# Patient Record
Sex: Male | Born: 1955 | Race: White | Hispanic: No | Marital: Married | Smoking: Current every day smoker
Health system: Southern US, Community
[De-identification: ages and names within clinical notes are randomized; demographics above are authoritative.]

## PROBLEM LIST (undated history)

## (undated) DIAGNOSIS — Z993 Dependence on wheelchair: Secondary | ICD-10-CM

## (undated) DIAGNOSIS — G629 Polyneuropathy, unspecified: Secondary | ICD-10-CM

## (undated) DIAGNOSIS — I219 Acute myocardial infarction, unspecified: Secondary | ICD-10-CM

## (undated) DIAGNOSIS — E119 Type 2 diabetes mellitus without complications: Secondary | ICD-10-CM

## (undated) DIAGNOSIS — F32A Depression, unspecified: Secondary | ICD-10-CM

## (undated) DIAGNOSIS — K5900 Constipation, unspecified: Secondary | ICD-10-CM

## (undated) DIAGNOSIS — J449 Chronic obstructive pulmonary disease, unspecified: Secondary | ICD-10-CM

## (undated) DIAGNOSIS — N400 Enlarged prostate without lower urinary tract symptoms: Secondary | ICD-10-CM

## (undated) DIAGNOSIS — I639 Cerebral infarction, unspecified: Secondary | ICD-10-CM

## (undated) DIAGNOSIS — I10 Essential (primary) hypertension: Secondary | ICD-10-CM

## (undated) DIAGNOSIS — F329 Major depressive disorder, single episode, unspecified: Secondary | ICD-10-CM

## (undated) DIAGNOSIS — J45909 Unspecified asthma, uncomplicated: Secondary | ICD-10-CM

## (undated) DIAGNOSIS — E785 Hyperlipidemia, unspecified: Secondary | ICD-10-CM

## (undated) DIAGNOSIS — I2510 Atherosclerotic heart disease of native coronary artery without angina pectoris: Secondary | ICD-10-CM

## (undated) DIAGNOSIS — F1010 Alcohol abuse, uncomplicated: Secondary | ICD-10-CM

## (undated) DIAGNOSIS — D510 Vitamin B12 deficiency anemia due to intrinsic factor deficiency: Secondary | ICD-10-CM

## (undated) DIAGNOSIS — Z9989 Dependence on other enabling machines and devices: Secondary | ICD-10-CM

## (undated) DIAGNOSIS — R296 Repeated falls: Secondary | ICD-10-CM

## (undated) DIAGNOSIS — F419 Anxiety disorder, unspecified: Secondary | ICD-10-CM

## (undated) DIAGNOSIS — G8929 Other chronic pain: Secondary | ICD-10-CM

## (undated) HISTORY — PX: CAROTID ENDARTERECTOMY: SUR193

## (undated) HISTORY — PX: CARDIAC CATHETERIZATION: SHX172

---

## 2011-07-11 DIAGNOSIS — I10 Essential (primary) hypertension: Secondary | ICD-10-CM | POA: Diagnosis not present

## 2011-07-11 DIAGNOSIS — G8929 Other chronic pain: Secondary | ICD-10-CM | POA: Diagnosis not present

## 2011-07-11 DIAGNOSIS — E559 Vitamin D deficiency, unspecified: Secondary | ICD-10-CM | POA: Diagnosis not present

## 2011-07-11 DIAGNOSIS — M129 Arthropathy, unspecified: Secondary | ICD-10-CM | POA: Diagnosis not present

## 2011-07-11 DIAGNOSIS — Z125 Encounter for screening for malignant neoplasm of prostate: Secondary | ICD-10-CM | POA: Diagnosis not present

## 2011-08-11 DIAGNOSIS — M25569 Pain in unspecified knee: Secondary | ICD-10-CM | POA: Diagnosis not present

## 2011-09-12 DIAGNOSIS — I119 Hypertensive heart disease without heart failure: Secondary | ICD-10-CM | POA: Diagnosis not present

## 2011-09-12 DIAGNOSIS — I2510 Atherosclerotic heart disease of native coronary artery without angina pectoris: Secondary | ICD-10-CM | POA: Diagnosis not present

## 2011-09-12 DIAGNOSIS — Z1212 Encounter for screening for malignant neoplasm of rectum: Secondary | ICD-10-CM | POA: Diagnosis not present

## 2011-09-21 DIAGNOSIS — Z1211 Encounter for screening for malignant neoplasm of colon: Secondary | ICD-10-CM | POA: Diagnosis not present

## 2011-11-09 DIAGNOSIS — S6990XA Unspecified injury of unspecified wrist, hand and finger(s), initial encounter: Secondary | ICD-10-CM | POA: Diagnosis not present

## 2011-11-09 DIAGNOSIS — Z79899 Other long term (current) drug therapy: Secondary | ICD-10-CM | POA: Diagnosis not present

## 2011-11-09 DIAGNOSIS — S66909A Unspecified injury of unspecified muscle, fascia and tendon at wrist and hand level, unspecified hand, initial encounter: Secondary | ICD-10-CM | POA: Diagnosis not present

## 2011-11-09 DIAGNOSIS — Z7982 Long term (current) use of aspirin: Secondary | ICD-10-CM | POA: Diagnosis not present

## 2011-11-09 DIAGNOSIS — S61409A Unspecified open wound of unspecified hand, initial encounter: Secondary | ICD-10-CM | POA: Diagnosis not present

## 2011-11-10 DIAGNOSIS — C4010 Malignant neoplasm of short bones of unspecified upper limb: Secondary | ICD-10-CM | POA: Diagnosis not present

## 2011-11-10 DIAGNOSIS — I779 Disorder of arteries and arterioles, unspecified: Secondary | ICD-10-CM | POA: Diagnosis not present

## 2011-11-10 DIAGNOSIS — G8929 Other chronic pain: Secondary | ICD-10-CM | POA: Diagnosis not present

## 2011-11-11 DIAGNOSIS — Z01818 Encounter for other preprocedural examination: Secondary | ICD-10-CM | POA: Diagnosis not present

## 2011-11-11 DIAGNOSIS — S61409A Unspecified open wound of unspecified hand, initial encounter: Secondary | ICD-10-CM | POA: Diagnosis not present

## 2011-11-11 DIAGNOSIS — M66239 Spontaneous rupture of extensor tendons, unspecified forearm: Secondary | ICD-10-CM | POA: Diagnosis not present

## 2011-11-11 DIAGNOSIS — I119 Hypertensive heart disease without heart failure: Secondary | ICD-10-CM | POA: Diagnosis not present

## 2011-11-11 DIAGNOSIS — M66249 Spontaneous rupture of extensor tendons, unspecified hand: Secondary | ICD-10-CM | POA: Diagnosis not present

## 2011-11-15 DIAGNOSIS — I252 Old myocardial infarction: Secondary | ICD-10-CM | POA: Diagnosis not present

## 2011-11-15 DIAGNOSIS — G8918 Other acute postprocedural pain: Secondary | ICD-10-CM | POA: Diagnosis not present

## 2011-11-15 DIAGNOSIS — S61209A Unspecified open wound of unspecified finger without damage to nail, initial encounter: Secondary | ICD-10-CM | POA: Diagnosis not present

## 2011-11-15 DIAGNOSIS — S61409A Unspecified open wound of unspecified hand, initial encounter: Secondary | ICD-10-CM | POA: Diagnosis not present

## 2011-11-15 DIAGNOSIS — F17200 Nicotine dependence, unspecified, uncomplicated: Secondary | ICD-10-CM | POA: Diagnosis not present

## 2011-11-15 DIAGNOSIS — E785 Hyperlipidemia, unspecified: Secondary | ICD-10-CM | POA: Diagnosis not present

## 2011-11-15 DIAGNOSIS — J449 Chronic obstructive pulmonary disease, unspecified: Secondary | ICD-10-CM | POA: Diagnosis not present

## 2011-11-15 DIAGNOSIS — Z79899 Other long term (current) drug therapy: Secondary | ICD-10-CM | POA: Diagnosis not present

## 2011-11-15 DIAGNOSIS — M25549 Pain in joints of unspecified hand: Secondary | ICD-10-CM | POA: Diagnosis not present

## 2011-11-15 DIAGNOSIS — F411 Generalized anxiety disorder: Secondary | ICD-10-CM | POA: Diagnosis not present

## 2011-11-15 DIAGNOSIS — I10 Essential (primary) hypertension: Secondary | ICD-10-CM | POA: Diagnosis not present

## 2011-11-24 DIAGNOSIS — M25549 Pain in joints of unspecified hand: Secondary | ICD-10-CM | POA: Diagnosis not present

## 2011-11-25 DIAGNOSIS — M25549 Pain in joints of unspecified hand: Secondary | ICD-10-CM | POA: Diagnosis not present

## 2011-11-28 DIAGNOSIS — M25549 Pain in joints of unspecified hand: Secondary | ICD-10-CM | POA: Diagnosis not present

## 2011-11-29 DIAGNOSIS — M25549 Pain in joints of unspecified hand: Secondary | ICD-10-CM | POA: Diagnosis not present

## 2011-12-02 DIAGNOSIS — M25549 Pain in joints of unspecified hand: Secondary | ICD-10-CM | POA: Diagnosis not present

## 2011-12-09 DIAGNOSIS — M25549 Pain in joints of unspecified hand: Secondary | ICD-10-CM | POA: Diagnosis not present

## 2011-12-12 DIAGNOSIS — M25549 Pain in joints of unspecified hand: Secondary | ICD-10-CM | POA: Diagnosis not present

## 2011-12-16 DIAGNOSIS — M25549 Pain in joints of unspecified hand: Secondary | ICD-10-CM | POA: Diagnosis not present

## 2012-02-10 DIAGNOSIS — M25569 Pain in unspecified knee: Secondary | ICD-10-CM | POA: Diagnosis not present

## 2012-02-10 DIAGNOSIS — Z79899 Other long term (current) drug therapy: Secondary | ICD-10-CM | POA: Diagnosis not present

## 2012-02-10 DIAGNOSIS — I10 Essential (primary) hypertension: Secondary | ICD-10-CM | POA: Diagnosis not present

## 2012-02-10 DIAGNOSIS — J449 Chronic obstructive pulmonary disease, unspecified: Secondary | ICD-10-CM | POA: Diagnosis not present

## 2012-02-10 DIAGNOSIS — N529 Male erectile dysfunction, unspecified: Secondary | ICD-10-CM | POA: Diagnosis not present

## 2012-02-10 DIAGNOSIS — Z5181 Encounter for therapeutic drug level monitoring: Secondary | ICD-10-CM | POA: Diagnosis not present

## 2012-02-10 DIAGNOSIS — G8929 Other chronic pain: Secondary | ICD-10-CM | POA: Diagnosis not present

## 2012-04-17 DIAGNOSIS — I2510 Atherosclerotic heart disease of native coronary artery without angina pectoris: Secondary | ICD-10-CM | POA: Diagnosis not present

## 2012-04-17 DIAGNOSIS — I669 Occlusion and stenosis of unspecified cerebral artery: Secondary | ICD-10-CM | POA: Diagnosis not present

## 2012-04-17 DIAGNOSIS — G894 Chronic pain syndrome: Secondary | ICD-10-CM | POA: Diagnosis not present

## 2012-04-17 DIAGNOSIS — F411 Generalized anxiety disorder: Secondary | ICD-10-CM | POA: Diagnosis not present

## 2012-11-08 DIAGNOSIS — E785 Hyperlipidemia, unspecified: Secondary | ICD-10-CM | POA: Diagnosis not present

## 2012-11-08 DIAGNOSIS — Z79899 Other long term (current) drug therapy: Secondary | ICD-10-CM | POA: Diagnosis not present

## 2012-11-08 DIAGNOSIS — R350 Frequency of micturition: Secondary | ICD-10-CM | POA: Diagnosis not present

## 2012-11-08 DIAGNOSIS — E039 Hypothyroidism, unspecified: Secondary | ICD-10-CM | POA: Diagnosis not present

## 2012-11-08 DIAGNOSIS — R5381 Other malaise: Secondary | ICD-10-CM | POA: Diagnosis not present

## 2012-11-08 DIAGNOSIS — I10 Essential (primary) hypertension: Secondary | ICD-10-CM | POA: Diagnosis not present

## 2012-11-08 DIAGNOSIS — R209 Unspecified disturbances of skin sensation: Secondary | ICD-10-CM | POA: Diagnosis not present

## 2013-01-11 DIAGNOSIS — Z79899 Other long term (current) drug therapy: Secondary | ICD-10-CM | POA: Diagnosis not present

## 2013-01-11 DIAGNOSIS — M79609 Pain in unspecified limb: Secondary | ICD-10-CM | POA: Diagnosis not present

## 2013-01-11 DIAGNOSIS — R209 Unspecified disturbances of skin sensation: Secondary | ICD-10-CM | POA: Diagnosis not present

## 2013-01-11 DIAGNOSIS — I10 Essential (primary) hypertension: Secondary | ICD-10-CM | POA: Diagnosis not present

## 2013-01-11 DIAGNOSIS — E785 Hyperlipidemia, unspecified: Secondary | ICD-10-CM | POA: Diagnosis not present

## 2013-01-11 DIAGNOSIS — M2550 Pain in unspecified joint: Secondary | ICD-10-CM | POA: Diagnosis not present

## 2013-04-12 DIAGNOSIS — R5381 Other malaise: Secondary | ICD-10-CM | POA: Diagnosis not present

## 2013-04-12 DIAGNOSIS — IMO0001 Reserved for inherently not codable concepts without codable children: Secondary | ICD-10-CM | POA: Diagnosis not present

## 2013-04-12 DIAGNOSIS — E785 Hyperlipidemia, unspecified: Secondary | ICD-10-CM | POA: Diagnosis not present

## 2013-04-12 DIAGNOSIS — M2550 Pain in unspecified joint: Secondary | ICD-10-CM | POA: Diagnosis not present

## 2013-04-12 DIAGNOSIS — R209 Unspecified disturbances of skin sensation: Secondary | ICD-10-CM | POA: Diagnosis not present

## 2013-04-12 DIAGNOSIS — I10 Essential (primary) hypertension: Secondary | ICD-10-CM | POA: Diagnosis not present

## 2013-04-12 DIAGNOSIS — Z79899 Other long term (current) drug therapy: Secondary | ICD-10-CM | POA: Diagnosis not present

## 2013-04-12 DIAGNOSIS — R7989 Other specified abnormal findings of blood chemistry: Secondary | ICD-10-CM | POA: Diagnosis not present

## 2013-04-12 DIAGNOSIS — Z23 Encounter for immunization: Secondary | ICD-10-CM | POA: Diagnosis not present

## 2013-07-17 DIAGNOSIS — M2550 Pain in unspecified joint: Secondary | ICD-10-CM | POA: Diagnosis not present

## 2013-07-17 DIAGNOSIS — E785 Hyperlipidemia, unspecified: Secondary | ICD-10-CM | POA: Diagnosis not present

## 2013-07-17 DIAGNOSIS — I10 Essential (primary) hypertension: Secondary | ICD-10-CM | POA: Diagnosis not present

## 2013-07-17 DIAGNOSIS — R51 Headache: Secondary | ICD-10-CM | POA: Diagnosis not present

## 2013-09-11 DIAGNOSIS — B351 Tinea unguium: Secondary | ICD-10-CM | POA: Diagnosis not present

## 2013-09-11 DIAGNOSIS — I739 Peripheral vascular disease, unspecified: Secondary | ICD-10-CM | POA: Diagnosis not present

## 2013-09-11 DIAGNOSIS — E538 Deficiency of other specified B group vitamins: Secondary | ICD-10-CM | POA: Diagnosis not present

## 2013-09-11 DIAGNOSIS — Z6833 Body mass index (BMI) 33.0-33.9, adult: Secondary | ICD-10-CM | POA: Diagnosis not present

## 2014-01-01 DIAGNOSIS — G589 Mononeuropathy, unspecified: Secondary | ICD-10-CM | POA: Diagnosis not present

## 2014-01-01 DIAGNOSIS — I2510 Atherosclerotic heart disease of native coronary artery without angina pectoris: Secondary | ICD-10-CM | POA: Diagnosis not present

## 2014-01-01 DIAGNOSIS — J449 Chronic obstructive pulmonary disease, unspecified: Secondary | ICD-10-CM | POA: Diagnosis not present

## 2014-01-01 DIAGNOSIS — I10 Essential (primary) hypertension: Secondary | ICD-10-CM | POA: Diagnosis not present

## 2014-01-01 DIAGNOSIS — I6350 Cerebral infarction due to unspecified occlusion or stenosis of unspecified cerebral artery: Secondary | ICD-10-CM | POA: Diagnosis not present

## 2014-01-01 DIAGNOSIS — E538 Deficiency of other specified B group vitamins: Secondary | ICD-10-CM | POA: Diagnosis not present

## 2014-01-01 DIAGNOSIS — E039 Hypothyroidism, unspecified: Secondary | ICD-10-CM | POA: Diagnosis not present

## 2014-01-01 DIAGNOSIS — R7301 Impaired fasting glucose: Secondary | ICD-10-CM | POA: Diagnosis not present

## 2014-05-08 DIAGNOSIS — Z6833 Body mass index (BMI) 33.0-33.9, adult: Secondary | ICD-10-CM | POA: Diagnosis not present

## 2014-05-08 DIAGNOSIS — F419 Anxiety disorder, unspecified: Secondary | ICD-10-CM | POA: Diagnosis not present

## 2014-05-08 DIAGNOSIS — M792 Neuralgia and neuritis, unspecified: Secondary | ICD-10-CM | POA: Diagnosis not present

## 2014-06-30 DIAGNOSIS — G629 Polyneuropathy, unspecified: Secondary | ICD-10-CM | POA: Diagnosis not present

## 2014-06-30 DIAGNOSIS — I10 Essential (primary) hypertension: Secondary | ICD-10-CM | POA: Diagnosis not present

## 2014-06-30 DIAGNOSIS — Z6833 Body mass index (BMI) 33.0-33.9, adult: Secondary | ICD-10-CM | POA: Diagnosis not present

## 2014-06-30 DIAGNOSIS — Z125 Encounter for screening for malignant neoplasm of prostate: Secondary | ICD-10-CM | POA: Diagnosis not present

## 2014-06-30 DIAGNOSIS — I2510 Atherosclerotic heart disease of native coronary artery without angina pectoris: Secondary | ICD-10-CM | POA: Diagnosis not present

## 2014-06-30 DIAGNOSIS — E538 Deficiency of other specified B group vitamins: Secondary | ICD-10-CM | POA: Diagnosis not present

## 2014-06-30 DIAGNOSIS — E782 Mixed hyperlipidemia: Secondary | ICD-10-CM | POA: Diagnosis not present

## 2014-06-30 DIAGNOSIS — R252 Cramp and spasm: Secondary | ICD-10-CM | POA: Diagnosis not present

## 2014-09-05 ENCOUNTER — Emergency Department (HOSPITAL_COMMUNITY): Payer: Medicare Other

## 2014-09-05 ENCOUNTER — Encounter (HOSPITAL_COMMUNITY): Payer: Self-pay

## 2014-09-05 ENCOUNTER — Emergency Department (HOSPITAL_COMMUNITY)
Admission: EM | Admit: 2014-09-05 | Discharge: 2014-09-05 | Disposition: A | Discharge status: Home / Self Care | Payer: Medicare Other | Attending: Emergency Medicine | Admitting: Emergency Medicine

## 2014-09-05 DIAGNOSIS — R079 Chest pain, unspecified: Secondary | ICD-10-CM | POA: Insufficient documentation

## 2014-09-05 DIAGNOSIS — S0990XA Unspecified injury of head, initial encounter: Secondary | ICD-10-CM | POA: Diagnosis not present

## 2014-09-05 DIAGNOSIS — S199XXA Unspecified injury of neck, initial encounter: Secondary | ICD-10-CM | POA: Diagnosis not present

## 2014-09-05 DIAGNOSIS — R531 Weakness: Secondary | ICD-10-CM | POA: Diagnosis not present

## 2014-09-05 DIAGNOSIS — N289 Disorder of kidney and ureter, unspecified: Secondary | ICD-10-CM | POA: Insufficient documentation

## 2014-09-05 DIAGNOSIS — R404 Transient alteration of awareness: Secondary | ICD-10-CM | POA: Diagnosis not present

## 2014-09-05 DIAGNOSIS — R4182 Altered mental status, unspecified: Secondary | ICD-10-CM | POA: Insufficient documentation

## 2014-09-05 DIAGNOSIS — J9811 Atelectasis: Secondary | ICD-10-CM | POA: Diagnosis not present

## 2014-09-05 DIAGNOSIS — R0602 Shortness of breath: Secondary | ICD-10-CM | POA: Insufficient documentation

## 2014-09-05 DIAGNOSIS — Z8673 Personal history of transient ischemic attack (TIA), and cerebral infarction without residual deficits: Secondary | ICD-10-CM | POA: Diagnosis not present

## 2014-09-05 DIAGNOSIS — R42 Dizziness and giddiness: Secondary | ICD-10-CM | POA: Insufficient documentation

## 2014-09-05 HISTORY — DX: Atherosclerotic heart disease of native coronary artery without angina pectoris: I25.10

## 2014-09-05 LAB — URINALYSIS, ROUTINE W REFLEX MICROSCOPIC
Bilirubin Urine: NEGATIVE
GLUCOSE, UA: NEGATIVE mg/dL
KETONES UR: NEGATIVE mg/dL
LEUKOCYTES UA: NEGATIVE
Nitrite: NEGATIVE
PROTEIN: 100 mg/dL — AB
Specific Gravity, Urine: 1.02 (ref 1.005–1.030)
UROBILINOGEN UA: 0.2 mg/dL (ref 0.0–1.0)
pH: 6 (ref 5.0–8.0)

## 2014-09-05 LAB — COMPREHENSIVE METABOLIC PANEL
ALBUMIN: 4.1 g/dL (ref 3.5–5.2)
ALK PHOS: 77 U/L (ref 39–117)
ALT: 15 U/L (ref 0–53)
AST: 18 U/L (ref 0–37)
Anion gap: 7 (ref 5–15)
BILIRUBIN TOTAL: 0.6 mg/dL (ref 0.3–1.2)
BUN: 23 mg/dL (ref 6–23)
CHLORIDE: 107 mmol/L (ref 96–112)
CO2: 27 mmol/L (ref 19–32)
CREATININE: 1.49 mg/dL — AB (ref 0.50–1.35)
Calcium: 9.7 mg/dL (ref 8.4–10.5)
GFR calc Af Amer: 58 mL/min — ABNORMAL LOW (ref >90)
GFR calc non Af Amer: 50 mL/min — ABNORMAL LOW (ref >90)
Glucose, Bld: 121 mg/dL — ABNORMAL HIGH (ref 70–99)
POTASSIUM: 4.3 mmol/L (ref 3.5–5.1)
SODIUM: 141 mmol/L (ref 135–145)
Total Protein: 7.8 g/dL (ref 6.0–8.3)

## 2014-09-05 LAB — URINE MICROSCOPIC-ADD ON

## 2014-09-05 LAB — CBC
HCT: 47.9 % (ref 39.0–52.0)
Hemoglobin: 15.9 g/dL (ref 13.0–17.0)
MCH: 32.3 pg (ref 26.0–34.0)
MCHC: 33.2 g/dL (ref 30.0–36.0)
MCV: 97.2 fL (ref 78.0–100.0)
Platelets: 241 10*3/uL (ref 150–400)
RBC: 4.93 MIL/uL (ref 4.22–5.81)
RDW: 15 % (ref 11.5–15.5)
WBC: 9.5 10*3/uL (ref 4.0–10.5)

## 2014-09-05 LAB — RAPID URINE DRUG SCREEN, HOSP PERFORMED
AMPHETAMINES: NOT DETECTED
BENZODIAZEPINES: POSITIVE — AB
Barbiturates: NOT DETECTED
Cocaine: NOT DETECTED
OPIATES: POSITIVE — AB
Tetrahydrocannabinol: NOT DETECTED

## 2014-09-05 LAB — DIFFERENTIAL
BASOS ABS: 0 10*3/uL (ref 0.0–0.1)
Basophils Relative: 0 % (ref 0–1)
Eosinophils Absolute: 0.2 10*3/uL (ref 0.0–0.7)
Eosinophils Relative: 2 % (ref 0–5)
LYMPHS PCT: 15 % (ref 12–46)
Lymphs Abs: 1.5 10*3/uL (ref 0.7–4.0)
Monocytes Absolute: 0.8 10*3/uL (ref 0.1–1.0)
Monocytes Relative: 9 % (ref 3–12)
NEUTROS PCT: 74 % (ref 43–77)
Neutro Abs: 7.1 10*3/uL (ref 1.7–7.7)

## 2014-09-05 LAB — I-STAT TROPONIN, ED: Troponin i, poc: 0 ng/mL (ref 0.00–0.08)

## 2014-09-05 LAB — AMMONIA: AMMONIA: 21 umol/L (ref 11–32)

## 2014-09-05 LAB — SALICYLATE LEVEL

## 2014-09-05 LAB — ETHANOL: Alcohol, Ethyl (B): <5 mg/dL (ref 0–9)

## 2014-09-05 LAB — PROTIME-INR
INR: 0.97 (ref 0.00–1.49)
Prothrombin Time: 13 seconds (ref 11.6–15.2)

## 2014-09-05 LAB — CK: CK TOTAL: 80 U/L (ref 7–232)

## 2014-09-05 LAB — ACETAMINOPHEN LEVEL

## 2014-09-05 LAB — APTT: aPTT: 25 seconds (ref 24–37)

## 2014-09-05 MED ORDER — LORAZEPAM 2 MG/ML IJ SOLN
1.0000 mg | Freq: Once | INTRAMUSCULAR | Status: AC
  Administered 2014-09-05: 1 mg via INTRAVENOUS
  Filled 2014-09-05: qty 1

## 2014-09-05 NOTE — ED Notes (Signed)
MD at bedside. 

## 2014-09-05 NOTE — ED Notes (Signed)
Pt. Trembling at present , difficult to obtain, EKG. Will wait for effects of ativan.

## 2014-09-05 NOTE — ED Notes (Signed)
Per EMS, wife states pt and a friend was sitting on the porch drinking beer at 1600 yesterday. States pt dropped his beer and fell. States pt did not get up until 1100 this morning and he had right sided weakness and would not talk to her.

## 2014-09-05 NOTE — ED Notes (Signed)
Pt. Wants to be discharged.

## 2014-09-05 NOTE — ED Notes (Addendum)
CT called, this pt. Is next for CT.

## 2014-09-05 NOTE — ED Provider Notes (Signed)
CSN: [id number]     Arrival date & time 09/05/14  1610 History  This chart was scribed for Ripley Fraise, MD by Chester Holstein, ED Scribe. This patient was seen in room APA18/APA18 and the patient's care was started at 4:17 PM.    Chief Complaint  Patient presents with  . Altered Mental Status   LEVEL 5 CAVEAT- ALTERED MENTAL STATUS  Patient is a 59 y.o. male presenting with altered mental status. The history is provided by the patient and the EMS personnel. The history is limited by the condition of the patient. No language interpreter was used.  Altered Mental Status  HPI Comments: George Valdez is a 59 y.o. male brought in by ambulance, who presents to the Emergency Department complaining of altered mental status with onset today. Pt with h/o of 2 strokes with deficits. Pt was last seen at baseline yesterday. Family reports pt fell around 4 PM yesterday, states he was holding a beer when he fell from standing to floor. Per EMS associated tremors noted. Pt's family reports he did not get up until 11 AM, noting right sided weakness, and would not talk to wife. Pt notes associated dizziness, chest pain and SOB. Pt denies daily EtOH use. Pt awake in exam room but is confused. Pt denies abdominal pain, headache, back pain, and neck pain.   PMH - stroke Soc hx - ETOH abuse History  Substance Use Topics  . Smoking status: Not on file  . Smokeless tobacco: Not on file  . Alcohol Use: Not on file    Review of Systems  Unable to perform ROS: Mental status change      Allergies  Review of patient's allergies indicates not on file.  Home Medications   Prior to Admission medications   Not on File   BP 116/76 mmHg  Pulse 70  Temp(Src) 99.2 F (37.3 C) (Rectal)  Resp 17  SpO2 93% Physical Exam CONSTITUTIONAL:  disheveled HEAD: Normocephalic/atraumatic EYES: horizontal mystagmus noted ENMT: Mucous membranes moist, No evidence of facial/nasal trauma NECK: supple no  meningeal signs SPINE/BACK:entire spine nontender CV: S1/S2 noted, no murmurs/rubs/gallops noted LUNGS: Lungs are clear to auscultation bilaterally, no apparent distress ABDOMEN: soft, nontender, no rebound or guarding, bowel sounds noted throughout abdomen GU:no cva tenderness NEURO: Pt is awake but appears confused, no facial droop noted, moves all extremities x 4; intermittent upper extremity twitching noted EXTREMITIES: pulses normal/equal, full ROM SKIN: warm, color normal PSYCH: unable to asscess  ED Course  Procedures  DIAGNOSTIC STUDIES: Oxygen Saturation is 93% on room air, adequate by my interpretation.    COORDINATION OF CARE: 4:22 PM Discussed treatment plan with patient at beside, the patient agrees with the plan and has no further questions at this time.  5:41 PM Imaging negative Wife at bedside reports he is more confused/drowsy than normal Unclear time of onset (last known well yesterday) tPA in stroke considered but not given due to: Onset over 3-4.5hours 7:08 PM Pt at baseline per wife He is awake/alert, no distress. He answers questions appropriately No focal weakness noted He has no complaints except for mild dizziness I advised admission as pt with unexplained confusion/weakness earlier in the day I advised I could not r/o stroke with CT head and I advised admission He would still like to go home Wife is agreeable to take him home They will call PCP after the weekend as he may be candidate for home health referral He is awake/alert, no distress and able to make  his own decisions We discussed strict return precautions BP 102/81 mmHg  Pulse 65  Temp(Src) 99.2 F (37.3 C) (Rectal)  Resp 12  SpO2 100%   Labs Review Labs Reviewed  COMPREHENSIVE METABOLIC PANEL - Abnormal; Notable for the following:    Glucose, Bld 121 (*)    Creatinine, Ser 1.49 (*)    GFR calc non Af Amer 50 (*)    GFR calc Af Amer 58 (*)    All other components within normal limits   URINE RAPID DRUG SCREEN (HOSP PERFORMED) - Abnormal; Notable for the following:    Opiates POSITIVE (*)    Benzodiazepines POSITIVE (*)    All other components within normal limits  URINALYSIS, ROUTINE W REFLEX MICROSCOPIC - Abnormal; Notable for the following:    Hgb urine dipstick MODERATE (*)    Protein, ur 100 (*)    All other components within normal limits  ACETAMINOPHEN LEVEL - Abnormal; Notable for the following:    Acetaminophen (Tylenol), Serum <10.0 (*)    All other components within normal limits  ETHANOL  PROTIME-INR  APTT  CBC  DIFFERENTIAL  CK  AMMONIA  SALICYLATE LEVEL  URINE MICROSCOPIC-ADD ON  I-STAT TROPOININ, ED  I-STAT TROPOININ, ED  I-STAT CHEM 8, ED    Imaging Review Ct Head Wo Contrast  09/05/2014   CLINICAL DATA:  Altered mental status, fell yesterday from standing position 1600 hours, slept longer today than normal, confusion, history coronary artery disease  EXAM: CT HEAD WITHOUT CONTRAST  CT CERVICAL SPINE WITHOUT CONTRAST  TECHNIQUE: Multidetector CT imaging of the head and cervical spine was performed following the standard protocol without intravenous contrast. Multiplanar CT image reconstructions of the cervical spine were also generated.  COMPARISON:  None  FINDINGS: CT HEAD FINDINGS  Generalized atrophy.  Normal ventricular morphology.  No midline shift or mass effect.  Old lacunar infarcts RIGHT basal ganglia and LEFT caudate head.  Old medial LEFT cerebellar infarct inferiorly.  No intracranial hemorrhage, mass lesion, or evidence acute infarction.  No extra-axial fluid collections.  Bones and sinuses unremarkable.  Mild atherosclerotic calcification at the carotid siphons.  CT CERVICAL SPINE FINDINGS  Scattered atherosclerotic calcifications with surgical clips adjacent to the LEFT carotid question prior endarterectomy.  Soft tissues otherwise unremarkable.  Visualized skullbase intact.  Prevertebral soft tissues normal thickness.  Disc space  narrowing with endplate spur formation at C5-C6.  Vertebral body heights maintained without fracture or subluxation.  Tips of lung apices clear.  IMPRESSION: Generalized atrophy.  Old LEFT cerebellar, LEFT caudate and RIGHT basal ganglia infarcts.  No acute intracranial abnormalities.  No acute cervical spine abnormalities.   Electronically Signed   By: Lavonia Dana M.D.   On: 09/05/2014 17:21   Ct Cervical Spine Wo Contrast  09/05/2014   CLINICAL DATA:  Altered mental status, fell yesterday from standing position 1600 hours, slept longer today than normal, confusion, history coronary artery disease  EXAM: CT HEAD WITHOUT CONTRAST  CT CERVICAL SPINE WITHOUT CONTRAST  TECHNIQUE: Multidetector CT imaging of the head and cervical spine was performed following the standard protocol without intravenous contrast. Multiplanar CT image reconstructions of the cervical spine were also generated.  COMPARISON:  None  FINDINGS: CT HEAD FINDINGS  Generalized atrophy.  Normal ventricular morphology.  No midline shift or mass effect.  Old lacunar infarcts RIGHT basal ganglia and LEFT caudate head.  Old medial LEFT cerebellar infarct inferiorly.  No intracranial hemorrhage, mass lesion, or evidence acute infarction.  No extra-axial fluid collections.  Bones and sinuses unremarkable.  Mild atherosclerotic calcification at the carotid siphons.  CT CERVICAL SPINE FINDINGS  Scattered atherosclerotic calcifications with surgical clips adjacent to the LEFT carotid question prior endarterectomy.  Soft tissues otherwise unremarkable.  Visualized skullbase intact.  Prevertebral soft tissues normal thickness.  Disc space narrowing with endplate spur formation at C5-C6.  Vertebral body heights maintained without fracture or subluxation.  Tips of lung apices clear.  IMPRESSION: Generalized atrophy.  Old LEFT cerebellar, LEFT caudate and RIGHT basal ganglia infarcts.  No acute intracranial abnormalities.  No acute cervical spine abnormalities.    Electronically Signed   By: Lavonia Dana M.D.   On: 09/05/2014 17:21   Dg Chest Portable 1 View  09/05/2014   CLINICAL DATA:  Altered mental status.  EXAM: PORTABLE CHEST - 1 VIEW  COMPARISON:  None.  FINDINGS: Cardiac enlargement without heart failure. Mild bibasilar atelectasis. Negative for pneumonia or effusion.  IMPRESSION: Mild bibasilar atelectasis.   Electronically Signed   By: Franchot Gallo M.D.   On: 09/05/2014 17:01     EKG Interpretation   Date/Time:  Friday September 05 2014 16:40:43 EST Ventricular Rate:  66 PR Interval:  180 QRS Duration: 97 QT Interval:  393 QTC Calculation: 412 R Axis:   -52 Text Interpretation:  Sinus rhythm Left anterior fascicular block  Borderline T abnormalities, anterior leads Abnormal ekg No previous ECGs  available Confirmed by Christy Gentles  MD, Elenore Rota ([id number]) on 09/05/2014 4:49:04  PM     Medications  LORazepam (ATIVAN) injection 1 mg (1 mg Intravenous Given 09/05/14 1636)    MDM   Final diagnoses:  Renal insufficiency  Altered mental status, unspecified altered mental status type    Nursing notes including past medical history and social history reviewed and considered in documentation xrays/imaging reviewed by myself and considered during evaluation Labs/vital reviewed myself and considered during evaluation   I personally performed the services described in this documentation, which was scribed in my presence. The recorded information has been reviewed and is accurate.       Ripley Fraise, MD 09/05/14 1910

## 2014-09-05 NOTE — ED Notes (Signed)
Pt evaluated by EDP.  

## 2014-09-05 NOTE — Discharge Instructions (Signed)

## 2014-09-08 DIAGNOSIS — R52 Pain, unspecified: Secondary | ICD-10-CM | POA: Diagnosis not present

## 2014-09-08 DIAGNOSIS — Z6833 Body mass index (BMI) 33.0-33.9, adult: Secondary | ICD-10-CM | POA: Diagnosis not present

## 2014-09-08 DIAGNOSIS — G629 Polyneuropathy, unspecified: Secondary | ICD-10-CM | POA: Diagnosis not present

## 2014-09-08 DIAGNOSIS — I639 Cerebral infarction, unspecified: Secondary | ICD-10-CM | POA: Diagnosis not present

## 2014-09-10 ENCOUNTER — Emergency Department (HOSPITAL_COMMUNITY): Payer: Medicare Other

## 2014-09-10 ENCOUNTER — Encounter (HOSPITAL_COMMUNITY): Payer: Self-pay | Admitting: Emergency Medicine

## 2014-09-10 ENCOUNTER — Other Ambulatory Visit (HOSPITAL_COMMUNITY): Payer: Self-pay

## 2014-09-10 ENCOUNTER — Inpatient Hospital Stay (HOSPITAL_COMMUNITY)
Admission: EM | Admit: 2014-09-10 | Discharge: 2014-09-12 | DRG: 896 | Disposition: A | Discharge status: Home Health | Payer: Medicare Other | Attending: Family Medicine | Admitting: Family Medicine

## 2014-09-10 DIAGNOSIS — F419 Anxiety disorder, unspecified: Secondary | ICD-10-CM | POA: Diagnosis present

## 2014-09-10 DIAGNOSIS — F329 Major depressive disorder, single episode, unspecified: Secondary | ICD-10-CM | POA: Diagnosis present

## 2014-09-10 DIAGNOSIS — G8929 Other chronic pain: Secondary | ICD-10-CM | POA: Diagnosis present

## 2014-09-10 DIAGNOSIS — G629 Polyneuropathy, unspecified: Secondary | ICD-10-CM | POA: Diagnosis present

## 2014-09-10 DIAGNOSIS — I69354 Hemiplegia and hemiparesis following cerebral infarction affecting left non-dominant side: Secondary | ICD-10-CM

## 2014-09-10 DIAGNOSIS — G9340 Encephalopathy, unspecified: Secondary | ICD-10-CM | POA: Diagnosis present

## 2014-09-10 DIAGNOSIS — N189 Chronic kidney disease, unspecified: Secondary | ICD-10-CM

## 2014-09-10 DIAGNOSIS — I10 Essential (primary) hypertension: Secondary | ICD-10-CM | POA: Diagnosis present

## 2014-09-10 DIAGNOSIS — R296 Repeated falls: Secondary | ICD-10-CM | POA: Diagnosis not present

## 2014-09-10 DIAGNOSIS — F10931 Alcohol use, unspecified with withdrawal delirium: Secondary | ICD-10-CM

## 2014-09-10 DIAGNOSIS — N289 Disorder of kidney and ureter, unspecified: Secondary | ICD-10-CM | POA: Diagnosis not present

## 2014-09-10 DIAGNOSIS — W19XXXA Unspecified fall, initial encounter: Secondary | ICD-10-CM

## 2014-09-10 DIAGNOSIS — R404 Transient alteration of awareness: Secondary | ICD-10-CM | POA: Diagnosis not present

## 2014-09-10 DIAGNOSIS — N400 Enlarged prostate without lower urinary tract symptoms: Secondary | ICD-10-CM | POA: Diagnosis present

## 2014-09-10 DIAGNOSIS — I2510 Atherosclerotic heart disease of native coronary artery without angina pectoris: Secondary | ICD-10-CM | POA: Diagnosis present

## 2014-09-10 DIAGNOSIS — R4182 Altered mental status, unspecified: Secondary | ICD-10-CM | POA: Diagnosis not present

## 2014-09-10 DIAGNOSIS — R001 Bradycardia, unspecified: Secondary | ICD-10-CM

## 2014-09-10 DIAGNOSIS — Z7982 Long term (current) use of aspirin: Secondary | ICD-10-CM

## 2014-09-10 DIAGNOSIS — Z79899 Other long term (current) drug therapy: Secondary | ICD-10-CM

## 2014-09-10 DIAGNOSIS — N179 Acute kidney failure, unspecified: Secondary | ICD-10-CM

## 2014-09-10 DIAGNOSIS — R410 Disorientation, unspecified: Secondary | ICD-10-CM

## 2014-09-10 DIAGNOSIS — Z7902 Long term (current) use of antithrombotics/antiplatelets: Secondary | ICD-10-CM

## 2014-09-10 DIAGNOSIS — D510 Vitamin B12 deficiency anemia due to intrinsic factor deficiency: Secondary | ICD-10-CM | POA: Diagnosis present

## 2014-09-10 DIAGNOSIS — F17210 Nicotine dependence, cigarettes, uncomplicated: Secondary | ICD-10-CM | POA: Diagnosis present

## 2014-09-10 DIAGNOSIS — Y900 Blood alcohol level of less than 20 mg/100 ml: Secondary | ICD-10-CM | POA: Diagnosis present

## 2014-09-10 DIAGNOSIS — I739 Peripheral vascular disease, unspecified: Secondary | ICD-10-CM

## 2014-09-10 DIAGNOSIS — F10231 Alcohol dependence with withdrawal delirium: Secondary | ICD-10-CM

## 2014-09-10 DIAGNOSIS — F10232 Alcohol dependence with withdrawal with perceptual disturbance: Principal | ICD-10-CM | POA: Diagnosis present

## 2014-09-10 DIAGNOSIS — E785 Hyperlipidemia, unspecified: Secondary | ICD-10-CM | POA: Diagnosis present

## 2014-09-10 DIAGNOSIS — R531 Weakness: Secondary | ICD-10-CM | POA: Diagnosis not present

## 2014-09-10 HISTORY — DX: Vitamin B12 deficiency anemia due to intrinsic factor deficiency: D51.0

## 2014-09-10 HISTORY — DX: Benign prostatic hyperplasia without lower urinary tract symptoms: N40.0

## 2014-09-10 HISTORY — DX: Essential (primary) hypertension: I10

## 2014-09-10 HISTORY — DX: Polyneuropathy, unspecified: G62.9

## 2014-09-10 HISTORY — DX: Hyperlipidemia, unspecified: E78.5

## 2014-09-10 HISTORY — DX: Major depressive disorder, single episode, unspecified: F32.9

## 2014-09-10 HISTORY — DX: Depression, unspecified: F32.A

## 2014-09-10 HISTORY — DX: Anxiety disorder, unspecified: F41.9

## 2014-09-10 HISTORY — DX: Other chronic pain: G89.29

## 2014-09-10 HISTORY — DX: Cerebral infarction, unspecified: I63.9

## 2014-09-10 LAB — CREATININE, SERUM
CREATININE: 1.97 mg/dL — AB (ref 0.50–1.35)
GFR calc Af Amer: 41 mL/min — ABNORMAL LOW (ref >90)
GFR calc non Af Amer: 35 mL/min — ABNORMAL LOW (ref >90)

## 2014-09-10 LAB — URINALYSIS, ROUTINE W REFLEX MICROSCOPIC
Bilirubin Urine: NEGATIVE
Glucose, UA: NEGATIVE mg/dL
KETONES UR: NEGATIVE mg/dL
Leukocytes, UA: NEGATIVE
NITRITE: NEGATIVE
PROTEIN: 100 mg/dL — AB
Specific Gravity, Urine: >1.03 — ABNORMAL HIGH (ref 1.005–1.030)
Urobilinogen, UA: 0.2 mg/dL (ref 0.0–1.0)
pH: 6 (ref 5.0–8.0)

## 2014-09-10 LAB — CBC
HCT: 47 % (ref 39.0–52.0)
HEMATOCRIT: 44 % (ref 39.0–52.0)
HEMOGLOBIN: 14.5 g/dL (ref 13.0–17.0)
Hemoglobin: 15.5 g/dL (ref 13.0–17.0)
MCH: 32.2 pg (ref 26.0–34.0)
MCH: 32.6 pg (ref 26.0–34.0)
MCHC: 33 g/dL (ref 30.0–36.0)
MCHC: 33 g/dL (ref 30.0–36.0)
MCV: 97.8 fL (ref 78.0–100.0)
MCV: 98.7 fL (ref 78.0–100.0)
Platelets: 223 10*3/uL (ref 150–400)
Platelets: 222 10*3/uL (ref 150–400)
RBC: 4.5 MIL/uL (ref 4.22–5.81)
RBC: 4.76 MIL/uL (ref 4.22–5.81)
RDW: 15.2 % (ref 11.5–15.5)
RDW: 15.3 % (ref 11.5–15.5)
WBC: 13.3 10*3/uL — ABNORMAL HIGH (ref 4.0–10.5)
WBC: 12.2 10*3/uL — AB (ref 4.0–10.5)

## 2014-09-10 LAB — COMPREHENSIVE METABOLIC PANEL
ALBUMIN: 3.7 g/dL (ref 3.5–5.2)
ALT: 16 U/L (ref 0–53)
AST: 15 U/L (ref 0–37)
Alkaline Phosphatase: 70 U/L (ref 39–117)
Anion gap: 6 (ref 5–15)
BUN: 40 mg/dL — ABNORMAL HIGH (ref 6–23)
CALCIUM: 8.9 mg/dL (ref 8.4–10.5)
CO2: 25 mmol/L (ref 19–32)
CREATININE: 2.29 mg/dL — AB (ref 0.50–1.35)
Chloride: 109 mmol/L (ref 96–112)
GFR calc Af Amer: 34 mL/min — ABNORMAL LOW (ref >90)
GFR, EST NON AFRICAN AMERICAN: 29 mL/min — AB (ref >90)
Glucose, Bld: 120 mg/dL — ABNORMAL HIGH (ref 70–99)
Potassium: 3.6 mmol/L (ref 3.5–5.1)
Sodium: 140 mmol/L (ref 135–145)
TOTAL PROTEIN: 7 g/dL (ref 6.0–8.3)
Total Bilirubin: 0.5 mg/dL (ref 0.3–1.2)

## 2014-09-10 LAB — RAPID URINE DRUG SCREEN, HOSP PERFORMED
Amphetamines: NOT DETECTED
BENZODIAZEPINES: POSITIVE — AB
Barbiturates: NOT DETECTED
Cocaine: NOT DETECTED
Opiates: POSITIVE — AB
Tetrahydrocannabinol: NOT DETECTED

## 2014-09-10 LAB — GLUCOSE, CAPILLARY: Glucose-Capillary: 118 mg/dL — ABNORMAL HIGH (ref 70–99)

## 2014-09-10 LAB — AMMONIA: AMMONIA: 22 umol/L (ref 11–32)

## 2014-09-10 LAB — URINE MICROSCOPIC-ADD ON

## 2014-09-10 LAB — TSH: TSH: 0.094 u[IU]/mL — AB (ref 0.350–4.500)

## 2014-09-10 LAB — ETHANOL

## 2014-09-10 LAB — TROPONIN I

## 2014-09-10 MED ORDER — LORAZEPAM 2 MG/ML IJ SOLN
0.0000 mg | Freq: Four times a day (QID) | INTRAMUSCULAR | Status: DC
  Administered 2014-09-10: 2 mg via INTRAVENOUS
  Filled 2014-09-10 (×2): qty 1

## 2014-09-10 MED ORDER — LORAZEPAM 2 MG/ML IJ SOLN: 0.0000 mg | Freq: Two times a day (BID) | INTRAMUSCULAR | Status: DC

## 2014-09-10 MED ORDER — BACLOFEN 10 MG PO TABS: 20.0000 mg | ORAL_TABLET | Freq: Two times a day (BID) | ORAL | Status: DC | PRN

## 2014-09-10 MED ORDER — PNEUMOCOCCAL VAC POLYVALENT 25 MCG/0.5ML IJ INJ
0.5000 mL | INJECTION | INTRAMUSCULAR | Status: DC
  Filled 2014-09-10: qty 0.5

## 2014-09-10 MED ORDER — LORAZEPAM 2 MG/ML IJ SOLN: 1.0000 mg | Freq: Four times a day (QID) | INTRAMUSCULAR | Status: DC | PRN

## 2014-09-10 MED ORDER — ONDANSETRON HCL 4 MG/2ML IJ SOLN: 4.0000 mg | Freq: Four times a day (QID) | INTRAMUSCULAR | Status: DC | PRN

## 2014-09-10 MED ORDER — GABAPENTIN 400 MG PO CAPS
800.0000 mg | ORAL_CAPSULE | Freq: Three times a day (TID) | ORAL | Status: DC
  Administered 2014-09-10 – 2014-09-12 (×5): 800 mg via ORAL
  Filled 2014-09-10 (×8): qty 2

## 2014-09-10 MED ORDER — TAMSULOSIN HCL 0.4 MG PO CAPS
0.4000 mg | ORAL_CAPSULE | Freq: Every day | ORAL | Status: DC
  Administered 2014-09-10 – 2014-09-12 (×3): 0.4 mg via ORAL
  Filled 2014-09-10 (×3): qty 1

## 2014-09-10 MED ORDER — ACETAMINOPHEN 650 MG RE SUPP: 650.0000 mg | Freq: Four times a day (QID) | RECTAL | Status: DC | PRN

## 2014-09-10 MED ORDER — VITAMIN B-12 1000 MCG PO TABS
1000.0000 ug | ORAL_TABLET | Freq: Every day | ORAL | Status: DC
  Administered 2014-09-10 – 2014-09-12 (×3): 1000 ug via ORAL
  Filled 2014-09-10 (×3): qty 1

## 2014-09-10 MED ORDER — DULOXETINE HCL 60 MG PO CPEP
60.0000 mg | ORAL_CAPSULE | Freq: Every day | ORAL | Status: DC
  Administered 2014-09-10 – 2014-09-12 (×3): 60 mg via ORAL
  Filled 2014-09-10 (×3): qty 1

## 2014-09-10 MED ORDER — ENALAPRIL MALEATE 5 MG PO TABS
10.0000 mg | ORAL_TABLET | Freq: Every day | ORAL | Status: DC
  Administered 2014-09-10 – 2014-09-12 (×3): 10 mg via ORAL
  Filled 2014-09-10 (×3): qty 2

## 2014-09-10 MED ORDER — SODIUM CHLORIDE 0.9 % IJ SOLN
3.0000 mL | Freq: Two times a day (BID) | INTRAMUSCULAR | Status: DC
  Administered 2014-09-10 – 2014-09-11 (×2): 3 mL via INTRAVENOUS

## 2014-09-10 MED ORDER — VITAMIN B-1 100 MG PO TABS
100.0000 mg | ORAL_TABLET | Freq: Every day | ORAL | Status: DC
  Administered 2014-09-10 – 2014-09-12 (×2): 100 mg via ORAL
  Filled 2014-09-10 (×2): qty 1

## 2014-09-10 MED ORDER — SODIUM CHLORIDE 0.9 % IV SOLN
INTRAVENOUS | Status: AC
  Administered 2014-09-10: 14:00:00 via INTRAVENOUS

## 2014-09-10 MED ORDER — HEPARIN SODIUM (PORCINE) 5000 UNIT/ML IJ SOLN
5000.0000 [IU] | Freq: Three times a day (TID) | INTRAMUSCULAR | Status: DC
  Administered 2014-09-10 – 2014-09-12 (×5): 5000 [IU] via SUBCUTANEOUS
  Filled 2014-09-10 (×6): qty 1

## 2014-09-10 MED ORDER — ACETAMINOPHEN 325 MG PO TABS: 650.0000 mg | ORAL_TABLET | Freq: Four times a day (QID) | ORAL | Status: DC | PRN

## 2014-09-10 MED ORDER — METOPROLOL SUCCINATE ER 50 MG PO TB24
50.0000 mg | ORAL_TABLET | Freq: Every day | ORAL | Status: DC
  Administered 2014-09-10 – 2014-09-11 (×2): 50 mg via ORAL
  Filled 2014-09-10 (×2): qty 1

## 2014-09-10 MED ORDER — SENNOSIDES-DOCUSATE SODIUM 8.6-50 MG PO TABS: 1.0000 | ORAL_TABLET | Freq: Every evening | ORAL | Status: DC | PRN

## 2014-09-10 MED ORDER — LORAZEPAM 1 MG PO TABS
1.0000 mg | ORAL_TABLET | Freq: Four times a day (QID) | ORAL | Status: DC | PRN
  Administered 2014-09-12: 1 mg via ORAL
  Filled 2014-09-10: qty 1

## 2014-09-10 MED ORDER — NALOXONE HCL 0.4 MG/ML IJ SOLN
0.2000 mg | Freq: Once | INTRAMUSCULAR | Status: AC
  Administered 2014-09-10: 0.2 mg via INTRAVENOUS
  Filled 2014-09-10: qty 1

## 2014-09-10 MED ORDER — THIAMINE HCL 100 MG/ML IJ SOLN
100.0000 mg | Freq: Every day | INTRAMUSCULAR | Status: DC
  Filled 2014-09-10: qty 2

## 2014-09-10 MED ORDER — ADULT MULTIVITAMIN W/MINERALS CH
1.0000 | ORAL_TABLET | Freq: Every day | ORAL | Status: DC
  Administered 2014-09-10 – 2014-09-12 (×3): 1 via ORAL
  Filled 2014-09-10 (×3): qty 1

## 2014-09-10 MED ORDER — ALBUTEROL SULFATE HFA 108 (90 BASE) MCG/ACT IN AERS: 2.0000 | INHALATION_SPRAY | Freq: Four times a day (QID) | RESPIRATORY_TRACT | Status: DC | PRN

## 2014-09-10 MED ORDER — ISOSORBIDE MONONITRATE 20 MG PO TABS
10.0000 mg | ORAL_TABLET | Freq: Every day | ORAL | Status: DC
  Administered 2014-09-10 – 2014-09-12 (×3): 10 mg via ORAL
  Filled 2014-09-10 (×3): qty 1

## 2014-09-10 MED ORDER — SODIUM CHLORIDE 0.9 % IV BOLUS (SEPSIS)
1000.0000 mL | Freq: Once | INTRAVENOUS | Status: AC
  Administered 2014-09-10: 1000 mL via INTRAVENOUS

## 2014-09-10 MED ORDER — MORPHINE SULFATE 2 MG/ML IJ SOLN
1.0000 mg | INTRAMUSCULAR | Status: DC | PRN
  Administered 2014-09-10: 1 mg via INTRAVENOUS
  Filled 2014-09-10 (×2): qty 1

## 2014-09-10 MED ORDER — ONDANSETRON HCL 4 MG PO TABS: 4.0000 mg | ORAL_TABLET | Freq: Four times a day (QID) | ORAL | Status: DC | PRN

## 2014-09-10 MED ORDER — CLOPIDOGREL BISULFATE 75 MG PO TABS
75.0000 mg | ORAL_TABLET | Freq: Every day | ORAL | Status: DC
  Administered 2014-09-10 – 2014-09-12 (×3): 75 mg via ORAL
  Filled 2014-09-10 (×3): qty 1

## 2014-09-10 MED ORDER — ALBUTEROL SULFATE (2.5 MG/3ML) 0.083% IN NEBU: 2.5000 mg | INHALATION_SOLUTION | Freq: Four times a day (QID) | RESPIRATORY_TRACT | Status: DC | PRN

## 2014-09-10 MED ORDER — SODIUM CHLORIDE 0.9 % IV SOLN
INTRAVENOUS | Status: DC
  Administered 2014-09-10: 17:00:00 via INTRAVENOUS
  Administered 2014-09-11: 100 mL/h via INTRAVENOUS
  Administered 2014-09-12: 09:00:00 via INTRAVENOUS

## 2014-09-10 MED ORDER — ROSUVASTATIN CALCIUM 20 MG PO TABS
20.0000 mg | ORAL_TABLET | Freq: Every day | ORAL | Status: DC
  Administered 2014-09-10 – 2014-09-12 (×3): 20 mg via ORAL
  Filled 2014-09-10 (×3): qty 1

## 2014-09-10 MED ORDER — TIOTROPIUM BROMIDE MONOHYDRATE 18 MCG IN CAPS
18.0000 ug | ORAL_CAPSULE | Freq: Every day | RESPIRATORY_TRACT | Status: DC | PRN
  Filled 2014-09-10: qty 5

## 2014-09-10 MED ORDER — ASPIRIN EC 81 MG PO TBEC
81.0000 mg | DELAYED_RELEASE_TABLET | Freq: Every day | ORAL | Status: DC
  Administered 2014-09-10 – 2014-09-12 (×3): 81 mg via ORAL
  Filled 2014-09-10 (×3): qty 1

## 2014-09-10 MED ORDER — FOLIC ACID 1 MG PO TABS
1.0000 mg | ORAL_TABLET | Freq: Every day | ORAL | Status: DC
  Administered 2014-09-10 – 2014-09-12 (×3): 1 mg via ORAL
  Filled 2014-09-10 (×3): qty 1

## 2014-09-10 NOTE — ED Notes (Signed)
Patient arrives via EMS from altered mental status that started last night. Patient's wife states he has fallen out of bed x 12 and has been talking to relatives that have passed away. Patient arrives alert/oriented, states he just feels weak all over.

## 2014-09-10 NOTE — ED Notes (Addendum)
Patient states year is 2012 and Tawni Pummel is POTUS. Patient with periods of apnea, 10-15 seconds. MD aware of apnea. Patient awakens to voice. Slurred speech noted. Patient denies any changes in medication or history recently. MD office called to update medical history due to patient being unable to [facility] to fax records ASAP.

## 2014-09-10 NOTE — ED Provider Notes (Addendum)
CSN: [id number]     Arrival date & time 09/10/14  3716 History  This chart was scribed for Davonna Belling, MD by Einar Pheasant, Medical Scribe. This patient was seen in room APA02/APA02 and the patient's care was started at 9:17 AM.    Chief Complaint  Patient presents with  . Altered Mental Status   LEVEL 5 CAVEAT-ALTERED MENTAL STATUS The history is provided by medical records and the EMS personnel. The history is limited by the condition of the patient. No language interpreter was used.   HPI Comments: George Valdez is a 59 y.o. male with PMhx of CAD and 2 strokes was brought in by ambulance, and presents to the Emergency Department complaining of Altered mental Status with hallucinations that started last night. Per EMS personnel-Wife states that the pt has fallen out of his bed multiple times, she approximates a number of 12 times. She also states that the pt claims to have conversations with deceased relatives. Pt states that he feels really weak. He denies any medicinal changes.   Past Medical History  Diagnosis Date  . Coronary artery disease   . Hypertension   . Hyperlipemia   . Anxiety   . Depression   . Chronic pain   . Neuropathy   . Pernicious anemia   . Stroke   . BPH (benign prostatic hyperplasia)    Past Surgical History  Procedure Laterality Date  . Carotid endarterectomy    . Cardiac catheterization     No family history on file. History  Substance Use Topics  . Smoking status: Current Every Day Smoker  . Smokeless tobacco: Not on file  . Alcohol Use: Yes    Review of Systems  Unable to perform ROS: Mental status change   Allergies  Codeine  Home Medications   Prior to Admission medications   Medication Sig Start Date End Date Taking? Authorizing Provider  albuterol (PROVENTIL HFA;VENTOLIN HFA) 108 (90 BASE) MCG/ACT inhaler Inhale 2 puffs into the lungs every 6 (six) hours as needed for wheezing or shortness of breath.   Yes Historical  Provider, MD  ALPRAZolam Duanne Moron) 1 MG tablet Take 1 mg by mouth 2 (two) times daily as needed for anxiety.   Yes Historical Provider, MD  aspirin EC 81 MG tablet Take 81 mg by mouth daily.   Yes Historical Provider, MD  baclofen (LIORESAL) 20 MG tablet Take 20 mg by mouth 2 (two) times daily as needed for muscle spasms.   Yes Historical Provider, MD  clopidogrel (PLAVIX) 75 MG tablet Take 75 mg by mouth daily.   Yes Historical Provider, MD  DULoxetine (CYMBALTA) 60 MG capsule Take 60 mg by mouth daily.   Yes Historical Provider, MD  enalapril (VASOTEC) 10 MG tablet Take 10 mg by mouth daily.   Yes Historical Provider, MD  gabapentin (NEURONTIN) 800 MG tablet Take 800 mg by mouth 3 (three) times daily.   Yes Historical Provider, MD  HYDROcodone-acetaminophen (NORCO/VICODIN) 5-325 MG per tablet Take 1 tablet by mouth 3 (three) times daily as needed for moderate pain.   Yes Historical Provider, MD  isosorbide mononitrate (ISMO,MONOKET) 20 MG tablet Take 10 mg by mouth daily.   Yes Historical Provider, MD  metoprolol succinate (TOPROL-XL) 50 MG 24 hr tablet Take 50 mg by mouth 2 (two) times daily. Take with or immediately following a meal.   Yes Historical Provider, MD  rosuvastatin (CRESTOR) 20 MG tablet Take 20 mg by mouth daily.   Yes Historical Provider, MD  tamsulosin (FLOMAX) 0.4 MG CAPS capsule Take 0.4 mg by mouth daily.   Yes Historical Provider, MD  tiotropium (SPIRIVA) 18 MCG inhalation capsule Place 18 mcg into inhaler and inhale daily as needed (for shortness of breath).    Yes Historical Provider, MD  vitamin B-12 (CYANOCOBALAMIN) 1000 MCG tablet Take 1,000 mcg by mouth daily.   Yes Historical Provider, MD   BP 104/67 mmHg  Pulse 48  Temp(Src) 97.3 F (36.3 C) (Rectal)  Resp 14  Ht 5\' 3"  (1.6 m)  Wt 188 lb (85.276 kg)  BMI 33.31 kg/m2  SpO2 100%   Physical Exam  Constitutional: He appears well-developed and well-nourished. No distress.  Pt is somewhat somnolent.   HENT:  Head:  Normocephalic and atraumatic.  Eyes: Conjunctivae are normal. Right eye exhibits no discharge. Left eye exhibits no discharge.  Pupils constricted bilaterally. Mild discharge to left eye area  Neck: Neck supple.  Cardiovascular: Regular rhythm and normal heart sounds.  Bradycardia present.  Exam reveals no gallop and no friction rub.   No murmur heard. Pulmonary/Chest: Effort normal and breath sounds normal. No respiratory distress.  Abdominal: Soft. He exhibits no distension. There is no tenderness.  Musculoskeletal: He exhibits edema. He exhibits no tenderness.  Bilateral pitting edema to lower extremities.  Neurological: He is alert.  Skin: Skin is warm and dry.  Psychiatric: He has a normal mood and affect. His behavior is normal. Thought content normal.  Nursing note and vitals reviewed.   ED Course  Procedures (including critical care time)  COORDINATION OF CARE: 9:23 AM- Pt will continue to be monitored. Essential labs and imaging ordered.   Labs Review Labs Reviewed  URINALYSIS, ROUTINE W REFLEX MICROSCOPIC - Abnormal; Notable for the following:    Specific Gravity, Urine >1.030 (*)    Hgb urine dipstick MODERATE (*)    Protein, ur 100 (*)    All other components within normal limits  URINE RAPID DRUG SCREEN (HOSP PERFORMED) - Abnormal; Notable for the following:    Opiates POSITIVE (*)    Benzodiazepines POSITIVE (*)    All other components within normal limits  CBC - Abnormal; Notable for the following:    WBC 13.3 (*)    All other components within normal limits  COMPREHENSIVE METABOLIC PANEL - Abnormal; Notable for the following:    Glucose, Bld 120 (*)    BUN 40 (*)    Creatinine, Ser 2.29 (*)    GFR calc non Af Amer 29 (*)    GFR calc Af Amer 34 (*)    All other components within normal limits  URINE MICROSCOPIC-ADD ON - Abnormal; Notable for the following:    Bacteria, UA MANY (*)    All other components within normal limits  GLUCOSE, CAPILLARY - Abnormal;  Notable for the following:    Glucose-Capillary 118 (*)    All other components within normal limits  AMMONIA  ETHANOL  TROPONIN I  CBC  CREATININE, SERUM  TSH  VITAMIN B12  RPR  HIV ANTIBODY (ROUTINE TESTING)  CBG MONITORING, ED    Imaging Review Dg Chest 1 View  09/10/2014   CLINICAL DATA:  59 year old male with altered mental status weakness and falls. Initial encounter.  EXAM: CHEST  1 VIEW  COMPARISON:  09/05/2014.  FINDINGS: Portable AP semi upright view at 0943 hours. Lung volumes are within normal limits. Allowing for portable technique, the lungs are clear. No pneumothorax identified. Normal cardiac size and mediastinal contours. Visualized tracheal air column is  within normal limits. Chronic right clavicle fracture. No acute osseous abnormality identified.  IMPRESSION: No acute cardiopulmonary abnormality.   Electronically Signed   By: Genevie Ann M.D.   On: 09/10/2014 09:57   Ct Head Wo Contrast  09/10/2014   CLINICAL DATA:  59 year old male with multiple falls and altered mental status. Initial encounter.  EXAM: CT HEAD WITHOUT CONTRAST  TECHNIQUE: Contiguous axial images were obtained from the base of the skull through the vertex without intravenous contrast.  COMPARISON:  09/05/2014.  FINDINGS: Visualized orbit soft tissues are within normal limits. No scalp hematoma identified. Stable paranasal sinuses and mastoids. Chronic left lamina papyracea fracture. No acute osseous abnormality identified. Calcified atherosclerosis at the skull base.  Midline and left cerebellar hemisphere infarcts appear stable and chronic. Heterogeneity of the left basal ganglia compatible with age indeterminate small vessel ischemia appears stable. Mild involvement of the right basal ganglia. Mild nonspecific white matter hypodensity. No intracranial mass effect or ventriculomegaly. No acute intracranial hemorrhage identified. No evidence of cortically based acute infarction identified. Stable intracranial  vascular hyperdensity.  IMPRESSION: 1. No acute intracranial abnormality. No acute traumatic injury identified. 2. Stable appearance of ischemia in the cerebellum and basal ganglia, favor to be chronic.   Electronically Signed   By: Genevie Ann M.D.   On: 09/10/2014 09:56     EKG Interpretation None      MDM   Final diagnoses:  Altered mental status  Renal insufficiency    Patient with altered mental status. Has been hallucinating. Has had falls. Slight decrease in his sedation with Narcan. Creatinine increased. Likely decreased oral intake. Will admit to internal medicine.   Davonna Belling, MD 09/10/14 [place], MD 09/10/14 [phone]

## 2014-09-10 NOTE — ED Notes (Signed)
Report given to [facility] [id number]. Ready to receive pt to floor.

## 2014-09-10 NOTE — H&P (Signed)
[facility]          History and Physical    PCP:   Rocky Morel, MD   Chief Complaint:  Hallucinations, falls  HPI: Patient is a 59 year old man with history significant for alcohol abuse, hypertension, pernicious anemia, peripheral vascular disease, carotid artery disease status post endarterectomy as well as coronary artery disease who presents to the hospital with the above-mentioned complaints. Patient is currently sleeping and difficult to arouse although he will wake up and answer simple questions. Most of history is obtained from patient's wife at bedside. She states he typically drinks 4-5 days a week and will drink anywhere from 4-8 beers. Patient's last drink was on Saturday. She states that last night at around 10:30 they decided to go to bed and he fell on his way to the bed, did not hit his head did not lose consciousness. She was able to get him in bed but he stayed awake all night and per her recollection he fell no less than 12 times last night each time he was able to pick himself up and get back into bed. At one point he was seeing "a little girl and a little boy in the living room" and he wanted to go and talk to them. Workup in the emergency department is significant for a negative CT scan of the head, no source of infection identified via chest x-ray and UA, normal ammonia level of 22, he does have acute renal failure with a creatinine of 2.29, mild leukocytosis of 13.3, negative Tylenol and aspirin levels. We have been asked to admit him for workup  of his acute encephalopathy.  Allergies:   Allergies  Allergen Reactions  . Codeine Itching      Past Medical History  Diagnosis Date  . Coronary artery disease   . Hypertension   . Hyperlipemia   . Anxiety   . Depression   . Chronic pain   . Neuropathy   . Pernicious anemia   . Stroke   . BPH (benign prostatic hyperplasia)     Past Surgical History  Procedure Laterality Date    . Carotid endarterectomy    . Cardiac catheterization      Prior to Admission medications   Medication Sig Start Date End Date Taking? Authorizing Provider  albuterol (PROVENTIL HFA;VENTOLIN HFA) 108 (90 BASE) MCG/ACT inhaler Inhale 2 puffs into the lungs every 6 (six) hours as needed for wheezing or shortness of breath.   Yes Historical Provider, MD  ALPRAZolam Duanne Moron) 1 MG tablet Take 1 mg by mouth 2 (two) times daily as needed for anxiety.   Yes Historical Provider, MD  aspirin EC 81 MG tablet Take 81 mg by mouth daily.   Yes Historical Provider, MD  baclofen (LIORESAL) 20 MG tablet Take 20 mg by mouth 2 (two) times daily as needed for muscle spasms.   Yes Historical Provider, MD  clopidogrel (PLAVIX) 75 MG tablet Take 75 mg by mouth daily.   Yes Historical Provider, MD  DULoxetine (CYMBALTA) 60 MG capsule Take 60 mg by mouth daily.   Yes Historical Provider, MD  enalapril (VASOTEC) 10 MG tablet Take 10 mg by mouth daily.   Yes Historical Provider, MD  gabapentin (NEURONTIN) 800 MG tablet Take 800 mg by mouth 3 (three) times daily.   Yes Historical Provider, MD  HYDROcodone-acetaminophen (NORCO/VICODIN) 5-325 MG per tablet Take 1 tablet by mouth 3 (three)  times daily as needed for moderate pain.   Yes Historical Provider, MD  isosorbide mononitrate (ISMO,MONOKET) 20 MG tablet Take 10 mg by mouth daily.   Yes Historical Provider, MD  metoprolol succinate (TOPROL-XL) 50 MG 24 hr tablet Take 50 mg by mouth 2 (two) times daily. Take with or immediately following a meal.   Yes Historical Provider, MD  rosuvastatin (CRESTOR) 20 MG tablet Take 20 mg by mouth daily.   Yes Historical Provider, MD  tamsulosin (FLOMAX) 0.4 MG CAPS capsule Take 0.4 mg by mouth daily.   Yes Historical Provider, MD  tiotropium (SPIRIVA) 18 MCG inhalation capsule Place 18 mcg into inhaler and inhale daily as needed (for shortness of breath).    Yes Historical Provider, MD  vitamin B-12 (CYANOCOBALAMIN) 1000 MCG tablet Take  1,000 mcg by mouth daily.   Yes Historical Provider, MD    Social History:  reports that he has been smoking.  He does not have any smokeless tobacco history on file. He reports that he drinks alcohol. His drug history is not on file.  Family history: Unable to obtain given current mental state  Review of Systems:   unable to obtain given current mental state  Physical Exam: Blood pressure 151/74, pulse 49, temperature 97.3 F (36.3 C), temperature source Rectal, resp. rate 16, height '5\' 3"'  (1.6 m), weight 87.544 kg (193 lb), SpO2 100 %.  general: Drowsy, arouses briefly to voice, can answer very simple questions but falls back asleep. HEENT: Normocephalic, atraumatic, pupils equal round reactive to light, very dry mucous membranes, poor dentition. Neck: Supple, no JVD, no lymphadenopathy, no bruits, no goiter. Cardiovascular: Regular rhythm, bradycardic, no murmurs, rubs or gallops  Lungs: Clear to auscultation bilaterally. Abdomen: Soft, nontender, nondistended, positive bowel sounds Extremities: No clubbing, cyanosis or edema, positive pedal pulses. Neurologic: Unable to fully assess given current mental state, however I have seen him moving all 4 extremities spontaneously  Labs on Admission:  Results for orders placed or performed during the hospital encounter of 09/10/14 (from the past 48 hour(s))  Urinalysis, Routine w reflex microscopic     Status: Abnormal   Collection Time: 09/10/14  9:08 AM  Result Value Ref Range   Color, Urine YELLOW YELLOW   APPearance CLEAR CLEAR   Specific Gravity, Urine >1.030 (H) 1.005 - 1.030   pH 6.0 5.0 - 8.0   Glucose, UA NEGATIVE NEGATIVE mg/dL   Hgb urine dipstick MODERATE (A) NEGATIVE   Bilirubin Urine NEGATIVE NEGATIVE   Ketones, ur NEGATIVE NEGATIVE mg/dL   Protein, ur 100 (A) NEGATIVE mg/dL   Urobilinogen, UA 0.2 0.0 - 1.0 mg/dL   Nitrite NEGATIVE NEGATIVE   Leukocytes, UA NEGATIVE NEGATIVE  Drug screen panel, emergency     Status:  Abnormal   Collection Time: 09/10/14  9:08 AM  Result Value Ref Range   Opiates POSITIVE (A) NONE DETECTED   Cocaine NONE DETECTED NONE DETECTED   Benzodiazepines POSITIVE (A) NONE DETECTED   Amphetamines NONE DETECTED NONE DETECTED   Tetrahydrocannabinol NONE DETECTED NONE DETECTED   Barbiturates NONE DETECTED NONE DETECTED    Comment:        DRUG SCREEN FOR MEDICAL PURPOSES ONLY.  IF CONFIRMATION IS NEEDED FOR ANY PURPOSE, NOTIFY LAB WITHIN 5 DAYS.        LOWEST DETECTABLE LIMITS FOR URINE DRUG SCREEN Drug Class       Cutoff (ng/mL) Amphetamine      1000 Barbiturate      200 Benzodiazepine   200  Tricyclics       357 Opiates          300 Cocaine          300 THC              50   Urine microscopic-add on     Status: Abnormal   Collection Time: 09/10/14  9:08 AM  Result Value Ref Range   Squamous Epithelial / LPF RARE RARE   RBC / HPF 7-10 <3 RBC/hpf   Bacteria, UA MANY (A) RARE  Glucose, capillary     Status: Abnormal   Collection Time: 09/10/14  9:20 AM  Result Value Ref Range   Glucose-Capillary 118 (H) 70 - 99 mg/dL  CBC     Status: Abnormal   Collection Time: 09/10/14  9:25 AM  Result Value Ref Range   WBC 13.3 (H) 4.0 - 10.5 K/uL   RBC 4.50 4.22 - 5.81 MIL/uL   Hemoglobin 14.5 13.0 - 17.0 g/dL   HCT 44.0 39.0 - 52.0 %   MCV 97.8 78.0 - 100.0 fL   MCH 32.2 26.0 - 34.0 pg   MCHC 33.0 30.0 - 36.0 g/dL   RDW 15.2 11.5 - 15.5 %   Platelets 223 150 - 400 K/uL  Comprehensive metabolic panel     Status: Abnormal   Collection Time: 09/10/14  9:25 AM  Result Value Ref Range   Sodium 140 135 - 145 mmol/L   Potassium 3.6 3.5 - 5.1 mmol/L   Chloride 109 96 - 112 mmol/L   CO2 25 19 - 32 mmol/L   Glucose, Bld 120 (H) 70 - 99 mg/dL   BUN 40 (H) 6 - 23 mg/dL   Creatinine, Ser 2.29 (H) 0.50 - 1.35 mg/dL   Calcium 8.9 8.4 - 10.5 mg/dL   Total Protein 7.0 6.0 - 8.3 g/dL   Albumin 3.7 3.5 - 5.2 g/dL   AST 15 0 - 37 U/L   ALT 16 0 - 53 U/L   Alkaline Phosphatase 70 39 -  117 U/L   Total Bilirubin 0.5 0.3 - 1.2 mg/dL   GFR calc non Af Amer 29 (L) >90 mL/min   GFR calc Af Amer 34 (L) >90 mL/min    Comment: (NOTE) The eGFR has been calculated using the CKD EPI equation. This calculation has not been validated in all clinical situations. eGFR's persistently <90 mL/min signify possible Chronic Kidney Disease.    Anion gap 6 5 - 15  Ammonia     Status: None   Collection Time: 09/10/14  9:25 AM  Result Value Ref Range   Ammonia 22 11 - 32 umol/L  Ethanol     Status: None   Collection Time: 09/10/14  9:25 AM  Result Value Ref Range   Alcohol, Ethyl (B) <5 0 - 9 mg/dL    Comment:        LOWEST DETECTABLE LIMIT FOR SERUM ALCOHOL IS 11 mg/dL FOR MEDICAL PURPOSES ONLY   Troponin I     Status: None   Collection Time: 09/10/14  9:25 AM  Result Value Ref Range   Troponin I <0.03 <0.031 ng/mL    Comment:        NO INDICATION OF MYOCARDIAL INJURY.     Radiological Exams on Admission: Dg Chest 1 View  09/10/2014   CLINICAL DATA:  59 year old male with altered mental status weakness and falls. Initial encounter.  EXAM: CHEST  1 VIEW  COMPARISON:  09/05/2014.  FINDINGS: Portable AP semi  upright view at 0943 hours. Lung volumes are within normal limits. Allowing for portable technique, the lungs are clear. No pneumothorax identified. Normal cardiac size and mediastinal contours. Visualized tracheal air column is within normal limits. Chronic right clavicle fracture. No acute osseous abnormality identified.  IMPRESSION: No acute cardiopulmonary abnormality.   Electronically Signed   By: Genevie Ann M.D.   On: 09/10/2014 09:57   Ct Head Wo Contrast  09/10/2014   CLINICAL DATA:  59 year old male with multiple falls and altered mental status. Initial encounter.  EXAM: CT HEAD WITHOUT CONTRAST  TECHNIQUE: Contiguous axial images were obtained from the base of the skull through the vertex without intravenous contrast.  COMPARISON:  09/05/2014.  FINDINGS: Visualized orbit  soft tissues are within normal limits. No scalp hematoma identified. Stable paranasal sinuses and mastoids. Chronic left lamina papyracea fracture. No acute osseous abnormality identified. Calcified atherosclerosis at the skull base.  Midline and left cerebellar hemisphere infarcts appear stable and chronic. Heterogeneity of the left basal ganglia compatible with age indeterminate small vessel ischemia appears stable. Mild involvement of the right basal ganglia. Mild nonspecific white matter hypodensity. No intracranial mass effect or ventriculomegaly. No acute intracranial hemorrhage identified. No evidence of cortically based acute infarction identified. Stable intracranial vascular hyperdensity.  IMPRESSION: 1. No acute intracranial abnormality. No acute traumatic injury identified. 2. Stable appearance of ischemia in the cerebellum and basal ganglia, favor to be chronic.   Electronically Signed   By: Genevie Ann M.D.   On: 09/10/2014 09:56    Assessment/Plan Active Problems:   Alcohol withdrawal delirium   Acute encephalopathy   ARF (acute renal failure)   Sinus bradycardia   HTN (hypertension)   CAD (coronary artery disease)   PVD (peripheral vascular disease)   Pernicious anemia    Acute encephalopathy -At this point most likely etiology appears to be alcohol withdrawals. -No current source of infection, do not believe that current degree of acute renal failure is playing a role, ammonia is within normal limits, CT scan of the head is negative and given lack of focal deficits I do not believe an MRI is warranted at this time.  Frequent falls -Suspect related to alcohol withdrawals. -Check TSH, B-12, RPR.  -PT evaluation once more alert.  Alcohol withdrawal -Placed on Ativan withdrawal protocol. -Thiamine/folate.  Acute renal failure -His current creatinine is 2.29, most recent creatinine is 1.49 on March 11. I do not have any more remote creatinines to determine chronicity. -IV  fluids and recheck renal function in the morning.  Sinus bradycardia  -We'll decrease his metoprolol from 50 mg twice a day to once daily and follow.  Pernicious anemia  -B-12 injections once a month administered by wife.  Hypertension -Continue home medications with the exception of decreased dose of metoprolol for above-mentioned reasons.  DVT prophylaxis -Subcutaneous heparin  CODE STATUS -Full code as discussed with wife at bedside.   Time Spent on Admission:  85 minutes  [facility] Pager: [phone] 09/10/2014, 3:14 PM

## 2014-09-11 LAB — HIV ANTIBODY (ROUTINE TESTING W REFLEX): HIV Screen 4th Generation wRfx: NONREACTIVE

## 2014-09-11 LAB — CBC
HCT: 42.6 % (ref 39.0–52.0)
Hemoglobin: 13.8 g/dL (ref 13.0–17.0)
MCH: 31.8 pg (ref 26.0–34.0)
MCHC: 32.4 g/dL (ref 30.0–36.0)
MCV: 98.2 fL (ref 78.0–100.0)
Platelets: 201 10*3/uL (ref 150–400)
RBC: 4.34 MIL/uL (ref 4.22–5.81)
RDW: 15.1 % (ref 11.5–15.5)
WBC: 8.7 10*3/uL (ref 4.0–10.5)

## 2014-09-11 LAB — BASIC METABOLIC PANEL
Anion gap: 4 — ABNORMAL LOW (ref 5–15)
BUN: 25 mg/dL — ABNORMAL HIGH (ref 6–23)
CO2: 22 mmol/L (ref 19–32)
CREATININE: 1.71 mg/dL — AB (ref 0.50–1.35)
Calcium: 8.5 mg/dL (ref 8.4–10.5)
Chloride: 117 mmol/L — ABNORMAL HIGH (ref 96–112)
GFR calc Af Amer: 48 mL/min — ABNORMAL LOW (ref >90)
GFR, EST NON AFRICAN AMERICAN: 42 mL/min — AB (ref >90)
Glucose, Bld: 117 mg/dL — ABNORMAL HIGH (ref 70–99)
Potassium: 3.2 mmol/L — ABNORMAL LOW (ref 3.5–5.1)
Sodium: 143 mmol/L (ref 135–145)

## 2014-09-11 LAB — RPR: RPR Ser Ql: NONREACTIVE

## 2014-09-11 LAB — VITAMIN B12: Vitamin B-12: 1870 pg/mL — ABNORMAL HIGH (ref 211–911)

## 2014-09-11 MED ORDER — POTASSIUM CHLORIDE CRYS ER 20 MEQ PO TBCR
40.0000 meq | EXTENDED_RELEASE_TABLET | Freq: Once | ORAL | Status: AC
  Administered 2014-09-11: 40 meq via ORAL
  Filled 2014-09-11: qty 2

## 2014-09-11 NOTE — Care Management Utilization Note (Signed)
UR completed 

## 2014-09-11 NOTE — Care Management Note (Addendum)
    Page 1 of 2   09/12/2014     1:49:12 PM CARE MANAGEMENT NOTE 09/12/2014  Patient:  Flam,Alder J   Account Number:  [id number]  Date Initiated:  09/11/2014  Documentation initiated by:  Jolene Provost  Subjective/Objective Assessment:   Pt is from home, lives with wife and independent at baseline. Pt has no HH services of DME's. PT to continue working with pt. Will cont to follow for CM needs.     Action/Plan:   Anticipated DC Date:  09/13/2014   Anticipated DC Plan:  [place]  CM consult      PAC Choice  [place]   Choice offered to / List presented to:  C-1 Patient   DME arranged  Vassie Moselle      DME agency  [place] arranged  [place].   Status of service:  Completed, signed off Medicare Important Message given?  YES (If response is "NO", the following Medicare IM given date fields will be blank) Date Medicare IM given:  09/12/2014 Medicare IM given by:  Jolene Provost Date Additional Medicare IM given:   Additional Medicare IM given by:    Discharge Disposition:  [place]  Per UR Regulation:  Reviewed for med. necessity/level of care/duration of stay  If discussed at [place] of Stay Meetings, dates discussed:    Comments:  09/12/2014 [place], RN, MSN, CM Pt being discharged home with [facility] services. Pt has choosen [facility] for [facility] and DME needs. Romualdo Bolk of [facility] has been notified of referral and will obtain pt information from chart. Pt notified that PT has 48 hours to make first visit. Terrence Dupont, of [facility] notified of referral and will obtain pt information from chart. Will deliver walker to pt room prior to discharge. Wife states pt has [employer], television/film/video but cant use it becasue he does not have a ramp to get out of the house and cant afford to have one built. Pt's wife given contact  information for volunteer services with [facility]. No further CM needs.  09/11/2014 [place], RN, MSN, CM

## 2014-09-11 NOTE — Progress Notes (Signed)
[facility] PROGRESS NOTE  George Valdez KVQ:[id number] DOB: May 16, 1956 DOA: 09/10/2014 PCP: Rocky Morel, MD  Assessment/Plan: Acute Encephalopathy -Much improved today. -Suspect from ETOH withdrawals. -CIWA scores have been low.  Frequent Falls -Again suspect related to ETOH abuse and withdrawals. -Seen by PT with recommendations for SNF. -B12 ok. -TSH low. Will check T3 and T4 levels.  ETOH Withdrawal -Seems to have been through the worst. -Low CIWA scores.  ARF Improving. -Cr down to 1.71. -It appears baseline Cr may be around 1.4.  Sinus Bradycardia Improving with decrease metoprolol dose.  Pernicious anemia -Continue monthly B12 injections.  Code Status: Full Code Family Communication: patient only. Wife 3/16.  Disposition Plan: SNF   Consultants:  None   Antibiotics:  None   Subjective: No complaints.  Objective: Filed Vitals:   09/10/14 1941 09/10/14 2109 09/11/14 0458 09/11/14 1000  BP: 124/72 104/68 108/78 124/77  Pulse: 59 58 63 70  Temp: 97.7 F (36.5 C) 98.2 F (36.8 C) 98 F (36.7 C)   TempSrc: Oral Oral Oral   Resp: 20 20 20    Height:      Weight:      SpO2: 100% 98% 96%     Intake/Output Summary (Last 24 hours) at 09/11/14 1504 Last data filed at 09/11/14 0930  Gross per 24 hour  Intake 1030.41 ml  Output    800 ml  Net 230.41 ml   Filed Weights   09/10/14 0910 09/10/14 1258  Weight: 85.276 kg (188 lb) 87.544 kg (193 lb)    Exam:   General:  AA Ox3  Cardiovascular: RRR  Respiratory: CTA B  Abdomen: S/NT/ND/+BS  Extremities: no C/C/E   Neurologic:  Non-focal  Data Reviewed: Basic Metabolic Panel:  Recent Labs Lab 09/05/14 1636 09/10/14 0925 09/10/14 1509 09/11/14 0603  NA 141 140  --  143  K 4.3 3.6  --  3.2*  CL 107 109  --  117*  CO2 27 25  --  22  GLUCOSE 121* 120*  --  117*  BUN 23 40*  --  25*  CREATININE 1.49* 2.29* 1.97* 1.71*  CALCIUM 9.7 8.9  --  8.5    Liver Function Tests:  Recent Labs Lab 09/05/14 1636 09/10/14 0925  AST 18 15  ALT 15 16  ALKPHOS 77 70  BILITOT 0.6 0.5  PROT 7.8 7.0  ALBUMIN 4.1 3.7   No results for input(s): LIPASE, AMYLASE in the last 168 hours.  Recent Labs Lab 09/05/14 1636 09/10/14 0925  AMMONIA 21 22   CBC:  Recent Labs Lab 09/05/14 1636 09/10/14 0925 09/10/14 1509 09/11/14 0603  WBC 9.5 13.3* 12.2* 8.7  NEUTROABS 7.1  --   --   --   HGB 15.9 14.5 15.5 13.8  HCT 47.9 44.0 47.0 42.6  MCV 97.2 97.8 98.7 98.2  PLT 241 223 222 201   Cardiac Enzymes:  Recent Labs Lab 09/05/14 1636 09/10/14 0925  CKTOTAL 80  --   TROPONINI  --  <0.03   BNP (last 3 results) No results for input(s): BNP in the last 8760 hours.  ProBNP (last 3 results) No results for input(s): PROBNP in the last 8760 hours.  CBG:  Recent Labs Lab 09/10/14 0920  GLUCAP 118*    No results found for this or any previous visit (from the past 240 hour(s)).   Studies: Dg Chest 1 View  09/10/2014   CLINICAL DATA:  59 year old male with altered mental status weakness and falls.  Initial encounter.  EXAM: CHEST  1 VIEW  COMPARISON:  09/05/2014.  FINDINGS: Portable AP semi upright view at 0943 hours. Lung volumes are within normal limits. Allowing for portable technique, the lungs are clear. No pneumothorax identified. Normal cardiac size and mediastinal contours. Visualized tracheal air column is within normal limits. Chronic right clavicle fracture. No acute osseous abnormality identified.  IMPRESSION: No acute cardiopulmonary abnormality.   Electronically Signed   By: Genevie Ann M.D.   On: 09/10/2014 09:57   Ct Head Wo Contrast  09/10/2014   CLINICAL DATA:  59 year old male with multiple falls and altered mental status. Initial encounter.  EXAM: CT HEAD WITHOUT CONTRAST  TECHNIQUE: Contiguous axial images were obtained from the base of the skull through the vertex without intravenous contrast.  COMPARISON:  09/05/2014.   FINDINGS: Visualized orbit soft tissues are within normal limits. No scalp hematoma identified. Stable paranasal sinuses and mastoids. Chronic left lamina papyracea fracture. No acute osseous abnormality identified. Calcified atherosclerosis at the skull base.  Midline and left cerebellar hemisphere infarcts appear stable and chronic. Heterogeneity of the left basal ganglia compatible with age indeterminate small vessel ischemia appears stable. Mild involvement of the right basal ganglia. Mild nonspecific white matter hypodensity. No intracranial mass effect or ventriculomegaly. No acute intracranial hemorrhage identified. No evidence of cortically based acute infarction identified. Stable intracranial vascular hyperdensity.  IMPRESSION: 1. No acute intracranial abnormality. No acute traumatic injury identified. 2. Stable appearance of ischemia in the cerebellum and basal ganglia, favor to be chronic.   Electronically Signed   By: Genevie Ann M.D.   On: 09/10/2014 09:56    Scheduled Meds: . aspirin EC  81 mg Oral Daily  . clopidogrel  75 mg Oral Daily  . DULoxetine  60 mg Oral Daily  . enalapril  10 mg Oral Daily  . folic acid  1 mg Oral Daily  . gabapentin  800 mg Oral TID  . heparin  5,000 Units Subcutaneous 3 times per day  . isosorbide mononitrate  10 mg Oral Daily  . LORazepam  0-4 mg Intravenous Q6H   Followed by  . [START ON 09/12/2014] LORazepam  0-4 mg Intravenous Q12H  . metoprolol succinate  50 mg Oral QHS  . multivitamin with minerals  1 tablet Oral Daily  . potassium chloride  40 mEq Oral Once  . rosuvastatin  20 mg Oral Daily  . sodium chloride  3 mL Intravenous Q12H  . tamsulosin  0.4 mg Oral Daily  . thiamine  100 mg Oral Daily   Or  . thiamine  100 mg Intravenous Daily  . vitamin B-12  1,000 mcg Oral Daily   Continuous Infusions: . sodium chloride 100 mL/hr (09/11/14 1324)    Active Problems:   Alcohol withdrawal delirium   Acute encephalopathy   ARF (acute renal  failure)   Sinus bradycardia   HTN (hypertension)   CAD (coronary artery disease)   PVD (peripheral vascular disease)   Pernicious anemia    Time spent: 35 minutes. Greater than 50% of this time was spent in direct contact with the patient coordinating care.    Lelon Frohlich  [facility] Pager [phone]  If 7PM-7AM, please contact night-coverage at [email or web address], password [facility] 09/11/2014, 3:04 PM  LOS: 1 day

## 2014-09-11 NOTE — Evaluation (Signed)
Physical Therapy Evaluation Patient Details Name: George Valdez MRN: [id number] DOB: 12-19-1955 Today's Date: 09/11/2014   History of Present Illness  Patient is a 59 year old man with history significant for alcohol abuse, hypertension, pernicious anemia, peripheral vascular disease, carotid artery disease status post endarterectomy as well as coronary artery disease who presents to the hospital with the above-mentioned complaints. Patient is currently sleeping and difficult to arouse although he will wake up and answer simple questions. Most of history is obtained from patient's wife at bedside. She states he typically drinks 4-5 days a week and will drink anywhere from 4-8 beers. Patient's last drink was on Saturday. She states that last night at around 10:30 they decided to go to bed and he fell on his way to the bed, did not hit his head did not lose consciousness. She was able to get him in bed but he stayed awake all night and per her recollection he fell no less than 12 times last night each time he was able to pick himself up and get back into bed. At one point he was seeing "a little girl and a little boy in the living room" and he wanted to go and talk to them. Workup in the emergency department is significant for a negative CT scan of the head, no source of infection identified via chest x-ray and UA, normal ammonia level of 22, he does have acute renal failure with a creatinine of 2.29, mild leukocytosis of 13.3, negative Tylenol and aspirin levels. We have been asked to admit him for workup of his acute encephalopathy.  Clinical Impression  Pt is alert and able to follow all directions.  He is normally independent with a cane at home.  He states that his balance has been decreased due to an old stroke and he is noted to have mild left hemiparesis with mild LLE extensor tone.  Currently, he is found to have mild weakness on the left with generalized deconditioning but primary problem  is a significant decrease in balance.  He now requires a walker and moderate assist due to poor balance.  His left extensor tone is heightened during gait which throws him off center.  He is not safe to return home unless balance improves and I am recommending SNF.  Pt agrees to this if necessary.    Follow Up Recommendations SNF    Equipment Recommendations  None recommended by PT    Recommendations for Other Services   none    Precautions / Restrictions Precautions Precautions: Fall Restrictions Weight Bearing Restrictions: No      Mobility  Bed Mobility Overal bed mobility: Modified Independent                Transfers Overall transfer level: Needs assistance Equipment used: Straight cane Transfers: Sit to/from Stand Sit to Stand: Supervision         General transfer comment: stable stance from sitting with a cane  Ambulation/Gait Ambulation/Gait assistance: Mod assist Ambulation Distance (Feet): 100 Feet Assistive device: Rolling walker (2 wheeled) Gait Pattern/deviations: Decreased dorsiflexion - left;Decreased weight shift to left;Ataxic   Gait velocity interpretation: at or above normal speed for age/gender General Gait Details: abducts LLE in order to advance that leg due to decreased ability to flex hip and knee  Stairs            Wheelchair Mobility    Modified Rankin (Stroke Patients Only)       Balance Overall balance assessment: Needs assistance Sitting-balance support:  No upper extremity supported;Feet supported Sitting balance-Leahy Scale: Good     Standing balance support: Single extremity supported;During functional activity Standing balance-Leahy Scale: Fair Standing balance comment: pt is unable to maintain balance with any challenge                             Pertinent Vitals/Pain Pain Assessment: No/denies pain    Home Living Family/patient expects to be discharged to:: Skilled nursing facility                       Prior Function Level of Independence: Independent with assistive device(s)         Comments: recently using a cane due to poor balance     Hand Dominance        Extremity/Trunk Assessment   Upper Extremity Assessment: LUE deficits/detail       LUE Deficits / Details: mild weakness and decreased coordination due to an old stroke   Lower Extremity Assessment: Generalized weakness;LLE deficits/detail   LLE Deficits / Details: mild extensor tone with decreased coordination     Communication   Communication: No difficulties  Cognition Arousal/Alertness: Awake/alert Behavior During Therapy: WFL for tasks assessed/performed Overall Cognitive Status: Within Functional Limits for tasks assessed                      General Comments      Exercises        Assessment/Plan    PT Assessment Patient needs continued PT services  PT Diagnosis Abnormality of gait;Generalized weakness   PT Problem List Decreased balance;Decreased mobility;Decreased knowledge of use of DME;Decreased safety awareness  PT Treatment Interventions Gait training;Balance training;DME instruction   PT Goals (Current goals can be found in the Care Plan section) Acute Rehab PT Goals Patient Stated Goal: none stated PT Goal Formulation: With patient Time For Goal Achievement: 09/25/14 Potential to Achieve Goals: Good    Frequency Min 3X/week   Barriers to discharge   none known    Co-evaluation               End of Session Equipment Utilized During Treatment: Gait belt Activity Tolerance: Patient tolerated treatment well Patient left: in bed;with call bell/phone within reach;with bed alarm set           Time: 0829-0900 PT Time Calculation (min) (ACUTE ONLY): 31 min   Charges:   PT Evaluation $Initial PT Evaluation Tier I: 1 Procedure     PT G CodesOwens Shark, Jameia Makris L 09/11/2014, 10:02 AM

## 2014-09-11 NOTE — Progress Notes (Signed)
Several attempts made to restart IV unsuccessful.

## 2014-09-11 NOTE — Progress Notes (Signed)
Patient removed IV from lt upper arm small skin tear noted. Dressing applied.

## 2014-09-12 ENCOUNTER — Inpatient Hospital Stay (HOSPITAL_COMMUNITY): Payer: Medicare Other

## 2014-09-12 LAB — BASIC METABOLIC PANEL
Anion gap: 7 (ref 5–15)
BUN: 21 mg/dL (ref 6–23)
CHLORIDE: 116 mmol/L — AB (ref 96–112)
CO2: 21 mmol/L (ref 19–32)
CREATININE: 1.5 mg/dL — AB (ref 0.50–1.35)
Calcium: 8.3 mg/dL — ABNORMAL LOW (ref 8.4–10.5)
GFR calc Af Amer: 57 mL/min — ABNORMAL LOW (ref >90)
GFR calc non Af Amer: 49 mL/min — ABNORMAL LOW (ref >90)
GLUCOSE: 107 mg/dL — AB (ref 70–99)
POTASSIUM: 3.1 mmol/L — AB (ref 3.5–5.1)
Sodium: 144 mmol/L (ref 135–145)

## 2014-09-12 LAB — T3, FREE: T3 FREE: 2.6 pg/mL (ref 2.0–4.4)

## 2014-09-12 LAB — T4, FREE: FREE T4: 0.87 ng/dL (ref 0.80–1.80)

## 2014-09-12 MED ORDER — POTASSIUM CHLORIDE CRYS ER 20 MEQ PO TBCR
40.0000 meq | EXTENDED_RELEASE_TABLET | Freq: Two times a day (BID) | ORAL | Status: DC
  Administered 2014-09-12: 40 meq via ORAL
  Filled 2014-09-12: qty 2

## 2014-09-12 MED ORDER — FOLIC ACID 1 MG PO TABS: 1.0000 mg | ORAL_TABLET | Freq: Every day | ORAL | Status: DC

## 2014-09-12 MED ORDER — THIAMINE HCL 100 MG PO TABS: 100.0000 mg | ORAL_TABLET | Freq: Every day | ORAL | Status: DC

## 2014-09-12 MED ORDER — ADULT MULTIVITAMIN W/MINERALS CH: 1.0000 | ORAL_TABLET | Freq: Every day | ORAL | Status: AC

## 2014-09-12 NOTE — Progress Notes (Addendum)
PROGRESS NOTE  George Valdez FWY:[id number] DOB: 1955-12-04 DOA: 09/10/2014 PCP: Rocky Morel, MD  Summary: 59 year old man with history of alcohol abuse, multiple other medical problems presented with confusion, hallucinations and multiple falls at home. Initial evaluation revealed acute renal failure. Admitted for further evaluation of acute encephalopathy. The following day he was noted to be alert and able to follow directions by physical therapy. Balance was noted to be poor. Initially skilled nursing facility was recommended but with some improvement home health has been recommended. Patient did not open to skilled nursing facility placement.  Assessment/Plan: 1. Acute encephalopathy with hallucinations. Resolved. Suspect combination of alcohol withdrawal, long-standing abuse, dehydration, poor diet. CT head negative, chest x-ray and urinalysis unremarkable. Serum ammonia level was normal. 2. Alcohol abuse with acute withdrawal. Appears resolved.  3. Acute kidney injury. Resolved. Superimposed on suspected CKD stage III 4. Frequent falls at home, thought to be secondary to alcohol abuse and withdrawal.  5. Low TSH. T3 and T4 normal. 6. CAD, stroke with residual left hemiparesis, pernicious anemia, peripheral vascular disease including carotid endarterectomy   Overall much improved. Acute encephalopathy resolved, no evidence of ongoing alcohol withdrawal. AKI has resolved.   Discussed with wife in detail, she is very concerned about stroke. We discussed less likely but can't rule out. Requests MRI, not unreasonable in long-term alcohol user.  Home with home health, rolling walker if stable and MRI unrevealing.  Addendum: discussed again with patient, he is awake, alert, has no new neuro deficits. He does not want an MRI, reports he is at baseline and wants to go home. Think MRI would be low yield as alcohol more likely etiology. Wife at bedside agrees, requests no  further workup or imaging. Will refer for outpatient neurology evaluation.  Uses Xanax at nighttime only for bed. Recommend consider wean as outpatient. Uses Norco 1 tablet daily  [name], MD  [facility]  Pager [phone] If 7PM-7AM, please contact night-coverage at [email or web address], password [facility] 09/12/2014, 12:22 PM  LOS: 2 days   Consultants:  Physical therapy: Home health PT.   Procedures:    Antibiotics:    HPI/Subjective: Feels much better, back to normal. No hallucinations. Eating fine. No n/v. No new neurologic deficits.  Objective: Filed Vitals:   09/11/14 1749 09/11/14 2108 09/12/14 0514 09/12/14 1218  BP: 110/76 145/79 150/82 156/85  Pulse: 64 63 68 74  Temp:  98 F (36.7 C) 98.4 F (36.9 C) 97.5 F (36.4 C)  TempSrc:  Oral Oral Oral  Resp:  20 20 18   Height:      Weight:      SpO2:  96% 96% 99%    Intake/Output Summary (Last 24 hours) at 09/12/14 1222 Last data filed at 09/12/14 0731  Gross per 24 hour  Intake    500 ml  Output   1100 ml  Net   -600 ml     Filed Weights   09/10/14 0910 09/10/14 1258  Weight: 85.276 kg (188 lb) 87.544 kg (193 lb)    Exam:     Afebrile, vital signs stable. No hypoxia. General:  Appears calm and comfortable Eyes: PERRL, normal lids, irises  ENT: grossly normal hearing Cardiovascular: RRR, no m/r/g. No LE edema. Telemetry: SR, no arrhythmias  Respiratory: CTA bilaterally, no w/r/r. Normal respiratory effort. Musculoskeletal: grossly normal tone BUE/BLE; able to lift both arms and legs off bed; weaker on left than right but baseline perpatient Psychiatric: grossly normal mood and affect, speech fluent and appropriate, oriented  to self, location, month, year. Neurologic: no new deficits noted.  Data Reviewed:  Potassium 3.1. Basic metabolic panel otherwise shows improving renal function.  Creatinine 2.29  >> 1.71  >> 1.50. BUN has normalized.  Troponin was negative  Vitamin B12 was  elevated  CBC unremarkable.   TSH low 0.094 but free T4 and T3 were within normal limits.   Urinalysis was negative  Serum alcohol is negative CT head no acute changes.  Chest x-ray no acute disease   EKG showed sinus rhythm with LVH with repolarization abnormalities    Scheduled Meds: . aspirin EC  81 mg Oral Daily  . clopidogrel  75 mg Oral Daily  . DULoxetine  60 mg Oral Daily  . enalapril  10 mg Oral Daily  . folic acid  1 mg Oral Daily  . gabapentin  800 mg Oral TID  . heparin  5,000 Units Subcutaneous 3 times per day  . isosorbide mononitrate  10 mg Oral Daily  . LORazepam  0-4 mg Intravenous Q6H   Followed by  . LORazepam  0-4 mg Intravenous Q12H  . metoprolol succinate  50 mg Oral QHS  . multivitamin with minerals  1 tablet Oral Daily  . potassium chloride  40 mEq Oral BID  . rosuvastatin  20 mg Oral Daily  . sodium chloride  3 mL Intravenous Q12H  . tamsulosin  0.4 mg Oral Daily  . thiamine  100 mg Oral Daily   Or  . thiamine  100 mg Intravenous Daily  . vitamin B-12  1,000 mcg Oral Daily   Continuous Infusions: . sodium chloride 100 mL/hr at 09/12/14 3295    Active Problems:   Acute encephalopathy   Alcohol withdrawal delirium   ARF (acute renal failure)   Sinus bradycardia   HTN (hypertension)   CAD (coronary artery disease)   PVD (peripheral vascular disease)   Pernicious anemia

## 2014-09-12 NOTE — Progress Notes (Signed)
Physical Therapy Treatment Patient Details Name: George Valdez MRN: [id number] DOB: 05/13/1956 Today's Date: 09/12/2014    History of Present Illness Patient is a 59 year old man with history significant for alcohol abuse, hypertension, pernicious anemia, peripheral vascular disease, carotid artery disease status post endarterectomy as well as coronary artery disease who presents to the hospital with the above-mentioned complaints. Patient is currently sleeping and difficult to arouse although he will wake up and answer simple questions. Most of history is obtained from patient's wife at bedside. She states he typically drinks 4-5 days a week and will drink anywhere from 4-8 beers. Patient's last drink was on Saturday. She states that last night at around 10:30 they decided to go to bed and he fell on his way to the bed, did not hit his head did not lose consciousness. She was able to get him in bed but he stayed awake all night and per her recollection he fell no less than 12 times last night each time he was able to pick himself up and get back into bed. At one point he was seeing "a little girl and a little boy in the living room" and he wanted to go and talk to them. Workup in the emergency department is significant for a negative CT scan of the head, no source of infection identified via chest x-ray and UA, normal ammonia level of 22, he does have acute renal failure with a creatinine of 2.29, mild leukocytosis of 13.3, negative Tylenol and aspirin levels. We have been asked to admit him for workup of his acute encephalopathy.    PT Comments    Pt very much wants to go home.  He states that he has a power w/c that he uses some of the time in the house and this will assist his mobility.  Gait stability with a walker is mildly improved today particularly when we can slow him down in order to give time for weight shifting.  Gait with a cane is very unstable and even though this is preferable  by pt he should not use a cane at this point.  He will need HHPT and a rolling walker at d/c.  Pt is agreeable with this.  I have recommended that his wife ambulate with him to maximize safety.  Follow Up Recommendations  Home health PT     Equipment Recommendations  Rolling walker with 5" wheels    Recommendations for Other Services       Precautions / Restrictions Precautions Precautions: Fall Restrictions Weight Bearing Restrictions: No    Mobility  Bed Mobility Overal bed mobility: Modified Independent                Transfers Overall transfer level: Modified independent Equipment used: Rolling walker (2 wheeled);Straight cane Transfers: Sit to/from Stand Sit to Stand: Supervision         General transfer comment: stable stance from sitting with a cane and walker  Ambulation/Gait Ambulation/Gait assistance: Min assist Ambulation Distance (Feet): 150 Feet Assistive device: Rolling walker (2 wheeled) Gait Pattern/deviations: Decreased dorsiflexion - left;Decreased weight shift to left   Gait velocity interpretation: at or above normal speed for age/gender General Gait Details: abducts LLE in order to advance that leg due to decreased ability to flex hip and knee..facilitation of left weight shift was done in stance which then improved gait rhythm...slowing gait down also facilitates left weight shift   Science writer  Modified Rankin (Stroke Patients Only)       Balance Overall balance assessment: Needs assistance Sitting-balance support: No upper extremity supported;Feet supported         Standing balance-Leahy Scale: Fair                      Cognition Arousal/Alertness: Awake/alert Behavior During Therapy: WFL for tasks assessed/performed Overall Cognitive Status: Within Functional Limits for tasks assessed                      Exercises      General Comments        Pertinent  Vitals/Pain Pain Assessment: No/denies pain    Home Living                      Prior Function            PT Goals (current goals can now be found in the care plan section) Progress towards PT goals: Progressing toward goals    Frequency  Min 3X/week    PT Plan Discharge plan needs to be updated    Co-evaluation             End of Session Equipment Utilized During Treatment: Gait belt Activity Tolerance: Patient tolerated treatment well Patient left: in chair;with call bell/phone within reach;with chair alarm set     Time: 7001-7494 PT Time Calculation (min) (ACUTE ONLY): 24 min  Charges:  $Gait Training: 8-22 mins                    G Codes:      Sable Feil October 01, 2014, 11:45 AM

## 2014-09-12 NOTE — Clinical Social Work Note (Signed)
CSW met with pt to discuss d/c plan and ETOH use. Pt states he is d/c today and will go home with home health. CSW attempted to discuss ETOH use, but pt refuses, stating it is not a problem for him. He accepted information on substance use and AA meetings. CSW will sign off.   Benay Pike, [place]

## 2014-09-12 NOTE — Progress Notes (Signed)
Patient with orders to be discharged home. Discharge instructions given, patient and wife verbalized understanding. Patient stable. Patient left in private vehicle with wife.

## 2014-09-12 NOTE — Discharge Summary (Signed)
Physician Discharge Summary  George Valdez HGD:[id number] DOB: 23-Nov-1955 DOA: 09/10/2014  PCP: Rocky Morel, MD  Admit date: 09/10/2014 Discharge date: 09/12/2014  Recommendations for Outpatient Follow-up:  1. Consider weaning off Xanax and hydrocodone if possible 2. Outpatient neurology follow-up recommended. Patient declined MRI. 3. Home health physical therapy. Recommend using a walker.   Follow-up Information    Follow up with [facility], KOFI, MD. Schedule an appointment as soon as possible for a visit in 2 weeks.   Specialty:  Neurology   Contact information:   [address] [place] [address] [phone]       Follow up with Rocky Morel, MD In 1 week.   Specialty:  Family Medicine   Contact information:   [address] [place] [address] [phone]      Discharge Diagnoses:  1. Acute encephalopathy with hallucinations 2. Alcohol abuse with acute alcohol withdrawal 3. Acute kidney injury 4. Frequent falls at home 5. 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 Initial evaluation revealed acute renal failure. Admitted for further evaluation of acute encephalopathy.  Hospital Course:  The following day he was noted to be alert and able to follow directions by physical therapy. Imaging including CT of the head was unremarkable. There is no evidence of infection. His history is most suggestive of alcohol withdrawal, long-standing abuse, dehydration, poor diet. No new focal neurologic deficits. Suspect sequela of long-term alcohol abuse.  Patient understands that stroke cannot be ruled out without MRI but he declined to have this pursued and requests discharge home. Stroke no suspected at this point. Balance was noted to be poor. Initially skilled nursing facility was recommended but with some improvement home health has been recommended. Patient was not open to skilled nursing facility placement.  1. Acute encephalopathy with hallucinations. Resolved. Suspect combination of alcohol withdrawal, long-standing abuse, dehydration, poor diet. CT head negative, chest x-ray and urinalysis unremarkable. Serum ammonia level was normal. 2. Alcohol abuse with acute withdrawal. Appears resolved.  3. Acute kidney injury. Resolved. Superimposed on suspected CKD stage III 4. Frequent falls at home, thought to be secondary to alcohol abuse and withdrawal.  5. Low TSH. T3 and T4 normal. 6. CAD, stroke with residual left hemiparesis, pernicious anemia, peripheral vascular disease including carotid endarterectomy   Overall much improved. Acute encephalopathy resolved, no evidence of ongoing alcohol withdrawal. AKI has resolved.   Discussed with wife in detail.  Home with home health, rolling walker if stable and MRI unrevealing. Patient is awake, alert, has no new neuro deficits. He does not want an MRI, reports he is at baseline and wants to go home. Think MRI would be low yield as alcohol more likely etiology. Wife at bedside agrees, requests no further workup or imaging. Will refer for outpatient neurology evaluation. Both understand that stroke cannot be ruled out without MRI.  Uses Xanax at nighttime only for bed. Recommend consider wean as outpatient. Uses Norco 1 tablet daily  Discharge Instructions  Discharge Instructions    Diet - low sodium heart healthy    Complete by:  As directed      Discharge instructions  Complete by:  As directed   Call your physician or seek immediate medical attention for falls, confusion,  hallucinations, new weakness or worsening of condition. Use walker.     Increase activity slowly    Complete by:  As directed           Current Discharge Medication List    START taking these medications   Details  folic acid (FOLVITE) 1 MG tablet Take 1 tablet (1 mg total) by mouth daily.    Multiple Vitamin (MULTIVITAMIN WITH MINERALS) TABS tablet Take 1 tablet by mouth daily.    thiamine 100 MG tablet Take 1 tablet (100 mg total) by mouth daily.      CONTINUE these medications which have NOT CHANGED   Details  albuterol (PROVENTIL HFA;VENTOLIN HFA) 108 (90 BASE) MCG/ACT inhaler Inhale 2 puffs into the lungs every 6 (six) hours as needed for wheezing or shortness of breath.    ALPRAZolam (XANAX) 1 MG tablet Take 1 mg by mouth 2 (two) times daily as needed for anxiety.    aspirin EC 81 MG tablet Take 81 mg by mouth daily.    baclofen (LIORESAL) 20 MG tablet Take 20 mg by mouth 2 (two) times daily as needed for muscle spasms.    clopidogrel (PLAVIX) 75 MG tablet Take 75 mg by mouth daily.    DULoxetine (CYMBALTA) 60 MG capsule Take 60 mg by mouth daily.    enalapril (VASOTEC) 10 MG tablet Take 10 mg by mouth daily.    gabapentin (NEURONTIN) 800 MG tablet Take 800 mg by mouth 3 (three) times daily.    HYDROcodone-acetaminophen (NORCO/VICODIN) 5-325 MG per tablet Take 1 tablet by mouth 3 (three) times daily as needed for moderate pain.    isosorbide mononitrate (ISMO,MONOKET) 20 MG tablet Take 10 mg by mouth daily.    metoprolol succinate (TOPROL-XL) 50 MG 24 hr tablet Take 50 mg by mouth 2 (two) times daily. Take with or immediately following a meal.    rosuvastatin (CRESTOR) 20 MG tablet Take 20 mg by mouth daily.    tamsulosin (FLOMAX) 0.4 MG CAPS capsule Take 0.4 mg by mouth daily.    tiotropium (SPIRIVA) 18 MCG inhalation capsule Place 18 mcg into inhaler and inhale daily as needed (for shortness of breath).     vitamin B-12 (CYANOCOBALAMIN) 1000 MCG tablet Take  1,000 mcg by mouth daily.       Allergies  Allergen Reactions  . Codeine Itching    The results of significant diagnostics from this hospitalization (including imaging, microbiology, ancillary and laboratory) are listed below for reference.    Significant Diagnostic Studies: Dg Chest 1 View  09/10/2014   CLINICAL DATA:  59 year old male with altered mental status weakness and falls. Initial encounter.  EXAM: CHEST  1 VIEW  COMPARISON:  09/05/2014.  FINDINGS: Portable AP semi upright view at 0943 hours. Lung volumes are within normal limits. Allowing for portable technique, the lungs are clear. No pneumothorax identified. Normal cardiac size and mediastinal contours. Visualized tracheal air column is within normal limits. Chronic right clavicle fracture. No acute osseous abnormality identified.  IMPRESSION: No acute cardiopulmonary abnormality.   Electronically Signed   By: Genevie Ann M.D.   On: 09/10/2014 09:57   Ct Head Wo Contrast  09/10/2014   CLINICAL DATA:  59 year old male with multiple falls and altered mental status. Initial encounter.  EXAM: CT HEAD WITHOUT CONTRAST  TECHNIQUE: Contiguous axial images were obtained from the base of the skull through the vertex  without intravenous contrast.  COMPARISON:  09/05/2014.  FINDINGS: Visualized orbit soft tissues are within normal limits. No scalp hematoma identified. Stable paranasal sinuses and mastoids. Chronic left lamina papyracea fracture. No acute osseous abnormality identified. Calcified atherosclerosis at the skull base.  Midline and left cerebellar hemisphere infarcts appear stable and chronic. Heterogeneity of the left basal ganglia compatible with age indeterminate small vessel ischemia appears stable. Mild involvement of the right basal ganglia. Mild nonspecific white matter hypodensity. No intracranial mass effect or ventriculomegaly. No acute intracranial hemorrhage identified. No evidence of cortically based acute infarction identified.  Stable intracranial vascular hyperdensity.  IMPRESSION: 1. No acute intracranial abnormality. No acute traumatic injury identified. 2. Stable appearance of ischemia in the cerebellum and basal ganglia, favor to be chronic.   Electronically Signed   By: Genevie Ann M.D.   On: 09/10/2014 09:56   Ct Head Wo Contrast  09/05/2014   CLINICAL DATA:  Altered mental status, fell yesterday from standing position 1600 hours, slept longer today than normal, confusion, history coronary artery disease  EXAM: CT HEAD WITHOUT CONTRAST  CT CERVICAL SPINE WITHOUT CONTRAST  TECHNIQUE: Multidetector CT imaging of the head and cervical spine was performed following the standard protocol without intravenous contrast. Multiplanar CT image reconstructions of the cervical spine were also generated.  COMPARISON:  None  FINDINGS: CT HEAD FINDINGS  Generalized atrophy.  Normal ventricular morphology.  No midline shift or mass effect.  Old lacunar infarcts RIGHT basal ganglia and LEFT caudate head.  Old medial LEFT cerebellar infarct inferiorly.  No intracranial hemorrhage, mass lesion, or evidence acute infarction.  No extra-axial fluid collections.  Bones and sinuses unremarkable.  Mild atherosclerotic calcification at the carotid siphons.  CT CERVICAL SPINE FINDINGS  Scattered atherosclerotic calcifications with surgical clips adjacent to the LEFT carotid question prior endarterectomy.  Soft tissues otherwise unremarkable.  Visualized skullbase intact.  Prevertebral soft tissues normal thickness.  Disc space narrowing with endplate spur formation at C5-C6.  Vertebral body heights maintained without fracture or subluxation.  Tips of lung apices clear.  IMPRESSION: Generalized atrophy.  Old LEFT cerebellar, LEFT caudate and RIGHT basal ganglia infarcts.  No acute intracranial abnormalities.  No acute cervical spine abnormalities.   Electronically Signed   By: Lavonia Dana M.D.   On: 09/05/2014 17:21   Ct Cervical Spine Wo Contrast  09/05/2014    CLINICAL DATA:  Altered mental status, fell yesterday from standing position 1600 hours, slept longer today than normal, confusion, history coronary artery disease  EXAM: CT HEAD WITHOUT CONTRAST  CT CERVICAL SPINE WITHOUT CONTRAST  TECHNIQUE: Multidetector CT imaging of the head and cervical spine was performed following the standard protocol without intravenous contrast. Multiplanar CT image reconstructions of the cervical spine were also generated.  COMPARISON:  None  FINDINGS: CT HEAD FINDINGS  Generalized atrophy.  Normal ventricular morphology.  No midline shift or mass effect.  Old lacunar infarcts RIGHT basal ganglia and LEFT caudate head.  Old medial LEFT cerebellar infarct inferiorly.  No intracranial hemorrhage, mass lesion, or evidence acute infarction.  No extra-axial fluid collections.  Bones and sinuses unremarkable.  Mild atherosclerotic calcification at the carotid siphons.  CT CERVICAL SPINE FINDINGS  Scattered atherosclerotic calcifications with surgical clips adjacent to the LEFT carotid question prior endarterectomy.  Soft tissues otherwise unremarkable.  Visualized skullbase intact.  Prevertebral soft tissues normal thickness.  Disc space narrowing with endplate spur formation at C5-C6.  Vertebral body heights maintained without fracture or subluxation.  Tips of lung apices  clear.  IMPRESSION: Generalized atrophy.  Old LEFT cerebellar, LEFT caudate and RIGHT basal ganglia infarcts.  No acute intracranial abnormalities.  No acute cervical spine abnormalities.   Electronically Signed   By: Lavonia Dana M.D.   On: 09/05/2014 17:21    Labs: Basic Metabolic Panel:  Recent Labs Lab 09/05/14 1636 09/10/14 0925 09/10/14 1509 09/11/14 0603 09/12/14 0706  NA 141 140  --  143 144  K 4.3 3.6  --  3.2* 3.1*  CL 107 109  --  117* 116*  CO2 27 25  --  22 21  GLUCOSE 121* 120*  --  117* 107*  BUN 23 40*  --  25* 21  CREATININE 1.49* 2.29* 1.97* 1.71* 1.50*  CALCIUM 9.7 8.9  --  8.5 8.3*    Liver Function Tests:  Recent Labs Lab 09/05/14 1636 09/10/14 0925  AST 18 15  ALT 15 16  ALKPHOS 77 70  BILITOT 0.6 0.5  PROT 7.8 7.0  ALBUMIN 4.1 3.7    Recent Labs Lab 09/05/14 1636 09/10/14 0925  AMMONIA 21 22   CBC:  Recent Labs Lab 09/05/14 1636 09/10/14 0925 09/10/14 1509 09/11/14 0603  WBC 9.5 13.3* 12.2* 8.7  NEUTROABS 7.1  --   --   --   HGB 15.9 14.5 15.5 13.8  HCT 47.9 44.0 47.0 42.6  MCV 97.2 97.8 98.7 98.2  PLT 241 223 222 201   Cardiac Enzymes:  Recent Labs Lab 09/05/14 1636 09/10/14 0925  CKTOTAL 90  --   TROPONINI  --  <0.03    CBG:  Recent Labs Lab 09/10/14 0920  GLUCAP 118*    Principal Problem:   Acute encephalopathy Active Problems:   Alcohol withdrawal delirium   ARF (acute renal failure)   Sinus bradycardia   HTN (hypertension)   CAD (coronary artery disease)   PVD (peripheral vascular disease)   Pernicious anemia   Time coordinating discharge: 45 minutes  Signed:  [name], MD [facility] 09/12/2014, 1:26 PM

## 2014-09-22 ENCOUNTER — Encounter: Payer: Self-pay | Admitting: *Deleted

## 2014-09-24 ENCOUNTER — Encounter: Payer: Self-pay | Admitting: *Deleted

## 2014-09-24 ENCOUNTER — Other Ambulatory Visit: Payer: Self-pay | Admitting: *Deleted

## 2014-09-24 NOTE — Patient Outreach (Signed)
I spoke with George Valdez by phone today on initial outreach. He was referred by Dr. Tedra Coupe for care management services. George Valdez is interested in services and scheduled a face to face visit with me in his home next week. His wife will be present for the visit as per his wishes. George Valdez said he understood when he was recently at the hospital that he would receive home health physical therapy but has not heard from anyone. I contacted Dr. Berenice Bouton office regarding this request.

## 2014-10-02 ENCOUNTER — Encounter: Payer: Self-pay | Admitting: *Deleted

## 2014-10-02 ENCOUNTER — Other Ambulatory Visit: Payer: Self-pay | Admitting: *Deleted

## 2014-10-02 VITALS — BP 160/90 | HR 64 | Ht 64.0 in | Wt 218.0 lb

## 2014-10-02 DIAGNOSIS — Y92009 Unspecified place in unspecified non-institutional (private) residence as the place of occurrence of the external cause: Principal | ICD-10-CM

## 2014-10-02 DIAGNOSIS — W19XXXA Unspecified fall, initial encounter: Secondary | ICD-10-CM

## 2014-10-02 DIAGNOSIS — I10 Essential (primary) hypertension: Secondary | ICD-10-CM

## 2014-10-02 NOTE — Patient Outreach (Addendum)
[facility]) Care Management   10/02/2014  George Valdez 13-Jan-1956 [id number]  George Valdez is an 59 y.o. male  Subjective:  "My biggest thing is my legs. My legs and my feet hurt and I can't even walk down the road to see my buddy."   Objective:  George Valdez was referred to [facility] from his provider's office. George Valdez was recently hospitalized with encephalopathy. He says he thinks he had a stroke.   I performed intake assessment today. Primary areas of concern/in need of intervention are fall risk and medication non-adherence.  Please see assessment/plan and care plan below.   BP 160/90 mmHg  Pulse 64  Ht 1.626 m (5\' 4" )  Wt 218 lb (98.884 kg)  BMI 37.40 kg/m2  SpO2 97%  Review of Systems  Constitutional: Negative.   HENT:       Reports headache improved since discharge from hospital but "manageable" with pain score "6".   Eyes: Negative.   Respiratory: Positive for cough.   Cardiovascular: Negative.   Gastrointestinal: Negative.   Genitourinary: Negative.   Musculoskeletal: Positive for myalgias and falls.       Complains of chronic leg and foot discomfort. Reports burning/aching bottoms of both feet. Reports aching and weak legs.   Skin: Negative.   Neurological: Positive for headaches.  Endo/Heme/Allergies: Positive for environmental allergies.       Complains of coughing, morning congestion, itching eyes, and sneezing secondary to spring pollen.  Psychiatric/Behavioral: Negative.     Physical Exam  Constitutional: He is oriented to person, place, and time. He appears well-developed and well-nourished. He is active.  Cardiovascular: Normal rate and regular rhythm.   Respiratory: Effort normal and breath sounds normal.  GI: Soft. Bowel sounds are normal.  Neurological: He is alert and oriented to person, place, and time.  Skin: Skin is warm, dry and intact.  Psychiatric: He has a normal mood and affect. His  speech is normal and behavior is normal. Judgment and thought content normal. Cognition and memory are normal.    Current Medications:   Current Outpatient Prescriptions  Medication Sig Dispense Refill  . albuterol (PROVENTIL HFA;VENTOLIN HFA) 108 (90 BASE) MCG/ACT inhaler Inhale 2 puffs into the lungs every 6 (six) hours as needed for wheezing or shortness of breath.    . ALPRAZolam (XANAX) 1 MG tablet Take 1 mg by mouth 2 (two) times daily as needed for anxiety.    Marland Kitchen aspirin EC 81 MG tablet Take 81 mg by mouth daily.    . baclofen (LIORESAL) 20 MG tablet Take 20 mg by mouth 2 (two) times daily as needed for muscle spasms.    . clopidogrel (PLAVIX) 75 MG tablet Take 75 mg by mouth daily.    . DULoxetine (CYMBALTA) 60 MG capsule Take 60 mg by mouth daily.    . enalapril (VASOTEC) 10 MG tablet Take 10 mg by mouth daily.    . folic acid (FOLVITE) 1 MG tablet Take 1 tablet (1 mg total) by mouth daily.    Marland Kitchen gabapentin (NEURONTIN) 800 MG tablet Take 800 mg by mouth 3 (three) times daily.    Marland Kitchen HYDROcodone-acetaminophen (NORCO/VICODIN) 5-325 MG per tablet Take 1 tablet by mouth 3 (three) times daily as needed for moderate pain.    . isosorbide mononitrate (ISMO,MONOKET) 20 MG tablet Take 10 mg by mouth daily.    . metoprolol succinate (TOPROL-XL) 50 MG 24 hr tablet Take 50 mg by mouth 2 (two) times daily.  Take with or immediately following a meal.    . Multiple Vitamin (MULTIVITAMIN WITH MINERALS) TABS tablet Take 1 tablet by mouth daily.    . rosuvastatin (CRESTOR) 20 MG tablet Take 20 mg by mouth daily.    . tamsulosin (FLOMAX) 0.4 MG CAPS capsule Take 0.4 mg by mouth daily.    Marland Kitchen thiamine 100 MG tablet Take 1 tablet (100 mg total) by mouth daily.    Marland Kitchen tiotropium (SPIRIVA) 18 MCG inhalation capsule Place 18 mcg into inhaler and inhale daily as needed (for shortness of breath).     . vitamin B-12 (CYANOCOBALAMIN) 1000 MCG tablet Take 1,000 mcg by mouth daily.     No current facility-administered  medications for this visit.    Functional Status:   In your present state of health, do you have any difficulty performing the following activities: 09/24/2014 09/10/2014  Is the patient deaf or have difficulty hearing? N N  Hearing - N  Vision - N  Difficulty concentrating or making decisions Y Y  Walking or climbing stairs? N N  Doing errands, shopping? Tempie Donning    Fall/Depression Screening:    Depression screen [facility] 2/9 10/02/2014  Decreased Interest 0  Down, Depressed, Hopeless 0  PHQ - 2 Score 0     Assessment:    Acute/Chronic Health Condition (tremors, headache) - George Valdez was struggling with headache and tremors when he was discharged from the hospital. He was having difficulty holding items and difficulty walking, having falls after his discharge. He says that his headaches are improved and his tremors are improved also. He is taking Norco for "leg pain" and Gabapentin as well for neuropathy.  George Valdez was referred to neurology (George Valdez in [facility]) for follow up of headache and tremors after his recent hospitalization for encephalopathy. On my visit today, I found out that he already has a new patient appointment scheduled with George Valdez at [facility] in [place]. George Valdez would prefer to have a neurologist locally and would like to see George Valdez.    Chronic Health Condition (PVD) - patient says his #1 health issue is dealing with leg and foot pain; he has PVD and says his feet hurt on the bottom and he isn't able to walk very far; he has a hoveround electric wheelchair but isn't able to use it outside the home but can't get it out because the rental mobile home doesn't have a ramp and the deck is unsafe; he uses a cane in the home and when he goes out; I have referred him to social work for [employer] - we may be able to help him get a ramp built   Chronic Health Condition (COPD) - George Valdez is still smoking "a  few" each day; I provided information to him about smoking cessation and he will consider this for later; he feels he has "too much on his plate" to work on this currently; our pharmacy can enroll him the smoking cessation program when he is ready   Chronic Health Condition (HTN) - he doesn't have a bp monitor at home but would like to have one; I'll check into this for him; he is taking medications as prescribed; he does not exercise; he is still smoking; George Valdez said he thought his blood pressure was "okay, it stays around the same". He is unable to verbalize target bp and lacks knowledge on how to manage bp.    Plan:  Pharmacy Referral Social Work Referral  [facility] Nurse Case Manager Visit May  Goals    . "I want my legs and feet to feel better so I can walk more."     Taking Gabapentin, narcotic analgesic as directed.     Marland Kitchen "I want to be able to get my hoveround out of the house"     Referral to social work for community resources for ramp building.     Marland Kitchen "I want to see if I can help with paying for my medicines and maybe get Medicaid. "     Social work referral. Pharmacy referral.     . Prevent Falls     Social work Media planner. Medication review/pharmacy referral.    . Quit smoking / using tobacco     Not ready to quit. Reviewed options/offered counseling. Patient considering.     . Record weight daily     No scale. Will obtain through [facility].          [place]        Patient Outreach from 10/02/2014 in [place] Problem One  ADL/IADL Deficits related to previous stroke   Care Plan for Problem One  Active   Interventions for Problem One Long Term Goal  discussed fall risk barriers at length with patient   THN Long Term Goal (31-90 days)  patient will receive appropriate resources for management of barriers creating fall risk over the next 60 days   THN Long Term Goal Start Date  10/02/14   THN CM Short Term Goal #1  (0-30 days)  patient will attend scheduled appointment with neurology provider in the next 30 days as evidenced by provider documentation of same   THN CM Short Term Goal #2 (0-30 days)  patient will verbalize understanding of community resources available for ramp for safe entry/exit of home   THN CM Short Term Goal #2 Start Date  10/02/14   Care Plan Problem Two  Medication NonAdherence   Care Plan for Problem Two  Active   Interventions for Problem Two Long Term Goal   interviewed patient about current reasons for not taking medications, financial status assessment   THN Long Term Goal (31-90) days  patient will receive appropriate resources for success in medication adherence over the next 60 days   THN Long Term Goal Start Date  10/02/14   THN CM Short Term Goal #1 (0-30 days)  patient will work with [facility] pharmacy team to assess options for medication assistance in the next 30 days   THN CM Short Term Goal #1 Start Date  10/02/14   THN CM Short Term Goal #2 (0-30 days)  patient will discuss financial assistance options with LCSW in the next 30 days    [facility] CM Short Term Goal #2 Start Date  10/02/14      [facility]  [phone]

## 2014-10-03 ENCOUNTER — Other Ambulatory Visit: Payer: Self-pay | Admitting: Licensed Clinical Social Worker

## 2014-10-03 ENCOUNTER — Other Ambulatory Visit: Payer: Self-pay

## 2014-10-03 ENCOUNTER — Telehealth: Payer: Self-pay

## 2014-10-03 NOTE — Patient Outreach (Signed)
George Valdez called me back and I have scheduled a home visit for Friday, October 10, 2014 at 10 am.  He stated he was able to get all his medications except Crestor which he has a call into his provider.    Deanne Coffer, PharmD, [place] [phone]

## 2014-10-03 NOTE — Patient Instructions (Signed)
Client to communicate with RN Janalyn Shy to discuss nursing needs of client. Client to take medications as prescribed and to attend scheduled medical appointments.  Client to call CSW at [phone] as needed to discuss clinical social work needs of client.

## 2014-10-03 NOTE — Patient Outreach (Signed)
I called Mr. Kirtz to set up an appointment.  I had to leave a HIPPA message with his wife to call me back. I will call him next week if he does not call me back.   Deanne Coffer, PharmD, [place] [phone]

## 2014-10-03 NOTE — Patient Outreach (Signed)
Assessment: CSW received referral on Gabriella Guile. Zyl[place].  CSW completed chart review on client.  RN Janalyn Shy met with client on 10/02/14 for initial home visit with client.  RN Janalyn Shy informed CSW that client needed information about Medicaid application process and needed information about seeking a ramp constructed at mobile home where client resides.  Client has hoverround electric wheelchair and is not able to get electric wheelchair in and out of mobile home since no ramp is constructed at mobile home.  Client has history of alcohol abuse and has experienced numerous falls in the home recently (with no serious injuries from these falls).  CSW called client on 10/02/14 and spoke via phone with client.  CSW verified identity of client.  Client and CSW spoke of current needs of client. Client spoke of need for rampobiletruction due to his having a hoverround electric wheelchair at home but having no ramp to help him go in and out of home through use of electric wheel chair.  CSW gave client the name of Jacqualine Code at [facility] ([place], [place]) and gave client Cathy's contact business number of [phone].  CSW encouraged client to call [facility] and request assessment by agency to see if client could qualify for ramp construction through that agency.  CSW informed client that assessment would be made at his home by a represenative of [facility]. CSW informed [name] that client would likely, if approved, be placed on a waiting list for volunteer group to construct ramp at his mobile home.  CSW informed [name] of the importance of [name]' communication with owner of mobile home to get approval of owner before a ramp construction project began.  [name] said he would get in touch by phone with owner of mobile home to get approval of mobile home owner for a ramp to be constructed at mobile home.  CSW stressed to client importance of client's getting  permission of mobile home owner to have ramp built at mobile home.  Client and CSW spoke of Medicaid application process for client.  CSW reviewed with client the items client would need to take to [facility] in [place], [place] to apply for Medicaid for client.  CSW.   encouraged client to gather these items and encouraged client to apply for Medicaid at [facility] in [place], [place].  [place] also spoke with client about food stamp benefits; CSW encouraged client to also apply for food stamps benefit at [facility] in [place], [place].  [place] and client spoke of food pantries in the area. Client said he is accessing food resources at several food pantries at present.  CSW and client spoke of Dr. Ethlyn Gallery, primary doctor for client.  Client said he sees Dr. Ethlyn Gallery for medical appointments at [facility] in [place].  Client said he had experienced numerous falls lately but has had no serious injuries from these falls. CSW spoke with client of [place] support for client with RN Janalyn Shy.  CSW gave client the [facility] phone number for CSW at [phone] and encouraged client to call CSW to address clinical social work needs of client.  Client was appreciative of phone call from [place] and of above information received.  Client later returned call to [place] and informed CSW that he had spoken via phone with Griffith Citron, manager of mobile home park [phone]) on 10/03/14  and that Griffith Citron had given verbal permission for a ramp  to be constructed at mobile home of client. CSW later spoke via phone with Jacqualine Code of [facility].  CSW made referral of client to Jacqualine Code to be considered for ramp construction at home of client.  Tye Maryland said she would call client to discuss process for qualifying for ramp construction.    Plan: Client to take medications as prescribed and attend scheduled medical  appointments. Client to communicate, as needed with Jacqualine Code of [facility] related to ramp construction process for client. Client to go to [facility] in [place], [place] to apply for Medicaid for client and to apply for food stamps benefit for client. Client to continue to access food pantry resources in area. CSW to call client next week to assess needs of client at that time.   Norva Riffle.Irma Delancey MSW, LCSW Licensed Clinical Social Worker [facility] Care Management [phone]

## 2014-10-06 ENCOUNTER — Encounter: Payer: Self-pay | Admitting: *Deleted

## 2014-10-10 ENCOUNTER — Other Ambulatory Visit: Payer: Self-pay

## 2014-10-10 NOTE — Patient Outreach (Signed)
I called Dr. Berenice Bouton office to let her know of my visit with George Valdez.  She is on maternity leave so I had to leave a message with George Logan, PA-C who will be back in the office next week.  I stated George Valdez was taking his Hydrocodone-Acetaminophen 6 times a day instead of 3 times a day, Alprazolam three times a day instead of twice a day.  I also stated he needed a prescription for Spiriva, Folic Acid and thiamine.  I left my call back number if she needed to call me back.   George Valdez, PharmD, [place] [phone]

## 2014-10-10 NOTE — Patient Outreach (Signed)
[facility]) Care Management  [facility] Care Manager  10/10/2014   Clevon Khader [facility] 08-11-1955 [id number]  Subjective: Mr. George Valdez is a 59 year old male who was referred to me by Janalyn Shy, RN, nurse care manager to assist with medications adherence and affordability.  Mr. Baumgartner has never applied for low income subsidy.  I was able to complete the application for low income subsidy with Mr. Rust and his wife.  They will receive a letter in the next 2 to 4 weeks letting them know if they are approved.  He reports he is taking his medications as prescribed and using the pill box Alisa gave him at her last visit with him.  Upon review of his medications and further questions, I determined he was taking his Hydrocodone-Acetaminophen six times a day even though it is prescribed to take three times a day.  This is giving him 1950 mg of acetaminophen daily which is large amount for him.  Also, he will not have enough to get him through the month.  He stated he is taking it this way because he wakes up and is in pain.  I stated this may be contributing to his oversedation and will place him at risk for falls.  I also determined he was taking his alprazolam three times a day and not twice a day as prescribed.  He stated he was doing this because he forgot he was only supposed to take it twice a day.  I explained to him he will not have enough of it now to last the entire month and he will not be able to fill it early.  He will be at risk for withdrawal symptoms for the one to two weeks he will not have it.  He stated he understood and will fill his pill box correctly to prevent him from taking it too much.  I did observe him fill his pill box.  He placed a full tablet of Isosorbide Mononitrate in each morning.  He did state he knew to only take a 1/2 of it.  I asked him to only place a half pill in the box to make sure that he did not take too much of it.  I corrected his pill box to  reflect the correct dose.  He also did not have his Spiriva, folic acid and thiamine.  Both folic acid and thiamine were prescribed from his most recent hospitalization.  He also reported to drinking 1-2 twelve ounce cans of beer 3 to 4 times a week.    Objective:   Current Medications:  Current Outpatient Prescriptions  Medication Sig Dispense Refill  . albuterol (PROVENTIL HFA;VENTOLIN HFA) 108 (90 BASE) MCG/ACT inhaler Inhale 2 puffs into the lungs every 6 (six) hours as needed for wheezing or shortness of breath.    . ALPRAZolam (XANAX) 1 MG tablet Take 1 mg by mouth 2 (two) times daily as needed for anxiety.    Marland Kitchen aspirin EC 81 MG tablet Take 81 mg by mouth daily.    . baclofen (LIORESAL) 20 MG tablet Take 20 mg by mouth 2 (two) times daily as needed for muscle spasms.    . clopidogrel (PLAVIX) 75 MG tablet Take 75 mg by mouth daily.    . Cyanocobalamin (B-12) 1000 MCG/ML KIT Inject 1,000 mcg as directed every 30 (thirty) days.    . DULoxetine (CYMBALTA) 60 MG capsule Take 60 mg by mouth daily.    . enalapril (VASOTEC) 10 MG tablet  Take 10 mg by mouth daily.    Marland Kitchen gabapentin (NEURONTIN) 800 MG tablet Take 800 mg by mouth 3 (three) times daily.    Marland Kitchen HYDROcodone-acetaminophen (NORCO/VICODIN) 5-325 MG per tablet Take 1 tablet by mouth 3 (three) times daily as needed for moderate pain.    . isosorbide mononitrate (ISMO,MONOKET) 20 MG tablet Take 10 mg by mouth daily.    . metoprolol succinate (TOPROL-XL) 50 MG 24 hr tablet Take 50 mg by mouth 2 (two) times daily. Take with or immediately following a meal.    . Multiple Vitamin (MULTIVITAMIN WITH MINERALS) TABS tablet Take 1 tablet by mouth daily.    . rosuvastatin (CRESTOR) 40 MG tablet Take 40 mg by mouth daily.    . tamsulosin (FLOMAX) 0.4 MG CAPS capsule Take 0.4 mg by mouth daily.    . vitamin B-12 (CYANOCOBALAMIN) 1000 MCG tablet Take 1,000 mcg by mouth daily.    . folic acid (FOLVITE) 1 MG tablet Take 1 tablet (1 mg total) by mouth daily.  (Patient not taking: Reported on 10/10/2014)    . rosuvastatin (CRESTOR) 20 MG tablet Take 20 mg by mouth daily.    Marland Kitchen thiamine 100 MG tablet Take 1 tablet (100 mg total) by mouth daily. (Patient not taking: Reported on 10/10/2014)    . tiotropium (SPIRIVA) 18 MCG inhalation capsule Place 18 mcg into inhaler and inhale daily as needed (for shortness of breath).      No current facility-administered medications for this visit.    Functional Status:  In your present state of health, do you have any difficulty performing the following activities: 10/02/2014 09/24/2014  Hearing? - N  Vision? - -  Difficulty concentrating or making decisions? - -  Walking or climbing stairs? - Y  Dressing or bathing? - N  Doing errands, shopping? - Y  Preparing Food and eating ? N -  Using the Toilet? N -  In the past six months, have you accidently leaked urine? N -  Do you have problems with loss of bowel control? N -  Managing your Medications? Y -  Managing your Finances? Y -  Housekeeping or managing your Housekeeping? Y -    Fall/Depression Screening: PHQ 2/9 Scores 10/02/2014  PHQ - 2 Score 0    Assessment:  1. Medication affordability:  Mr. Cato needs assistance affording his medications.  He has never applied for low income subsidy. 2.  Medication adherence:  Mr. Latner is overusing his Hydrocodone-Acetaminophen and Alprazolam.  He also did not know that folic acid and thiamine were prescribed at discharge.  The pharmacy did not have any records of it although it was on the discharge summary.  He also have not filled his Spiriva in two months.   Plan:  1.  I assisted Mr. Gallaher with completing the application for low income subsidy online.  He will hear if he has been approved in the next 2 to 4 weeks.  2.  I will notify Dr. Ethlyn Gallery of his overuse of hydrocodone-acetaminophen and alprazolam.   3.  I will ask Dr. Ethlyn Gallery to give him prescriptions for Spiriva, Folic Acid and thiamine if she  wants him to continue on them. 4.  I have notified Janalyn Shy, RN, nurse care manager about my findings.  5.  I will follow up in 4 to 5 weeks to review his adherence to his medications and to determine if he was approved for low income subsidy.    Deanne Coffer, PharmD, [place]  Research officer, trade union [phone]

## 2014-10-13 ENCOUNTER — Other Ambulatory Visit: Payer: Self-pay | Admitting: Licensed Clinical Social Worker

## 2014-10-13 NOTE — Patient Outreach (Signed)
   Assessment:  CSW spoke via phone with client on 10/13/14. CSW asked client about ramp construction progress for client.  Client said that a representative of [facility] had conducted home visit with client recently to do assessment of client needs related to ramp construction.  Client said that he informed agency representative that he did not have funds to purchase materials to construct ramp for client.  Client said that representative of agency said it could be June of 2016 or July of 2016 before funds may be available for use for ramp projects through agency.  Client said he has appointment scheduled for this week with Dr. Ethlyn Gallery, primary doctor for client.  Client said he has been using a quad cane to help him go in and out of his mobile home.  Client said appointment this week with Dr. Ethlyn Gallery was for a routine checkup.  CSW informed client that CSW would again call representative at [facility] to discuss possible timeline for potential ramp construction funds through that agency.  CSW and client agreed for CSW to call client in one week to further discuss needs of client. Client was appreciative of call from [place] on 10/13/14.  Plan: Client to take medications as prescribed and to attend scheduled medical appointments. Client to contact Dr. Ethlyn Gallery, primary doctor, related to medical needs of client. Client to call Janalyn Shy, RN, as needed to address nursing needs of client. CSW to call client in one week to assess needs of client.  Norva Riffle.Valyncia Wiens MSW, LCSW Licensed Clinical Social Worker [facility] Care Management [phone]

## 2014-10-14 ENCOUNTER — Encounter: Payer: Self-pay | Admitting: *Deleted

## 2014-10-15 DIAGNOSIS — E6609 Other obesity due to excess calories: Secondary | ICD-10-CM | POA: Diagnosis not present

## 2014-10-15 DIAGNOSIS — Z6835 Body mass index (BMI) 35.0-35.9, adult: Secondary | ICD-10-CM | POA: Diagnosis not present

## 2014-10-15 DIAGNOSIS — Z7409 Other reduced mobility: Secondary | ICD-10-CM | POA: Diagnosis not present

## 2014-10-17 ENCOUNTER — Ambulatory Visit: Payer: Medicare Other | Admitting: Neurology

## 2014-10-20 ENCOUNTER — Encounter: Payer: Self-pay | Admitting: Neurology

## 2014-10-20 ENCOUNTER — Telehealth: Payer: Self-pay | Admitting: Neurology

## 2014-10-20 NOTE — Telephone Encounter (Signed)
Pt no showed NP appt w/ Dr. Tomi Likens 4/22/*16. No show letter + policy mailed to pt. Dr. Andris Flurry [place]'s office notified via EPIC routing/fax / Sherri S.

## 2014-11-03 ENCOUNTER — Encounter: Payer: Self-pay | Admitting: *Deleted

## 2014-11-03 NOTE — Patient Outreach (Signed)
[facility]) Care Management  11/03/2014  George Valdez Oct 08, 1955 [id number]   Received notification from Janalyn Shy, RN patient is participating with [place] Management services.  George Valdez. [place] CM Assistant Phone: [phone] Fax: [phone]

## 2014-11-04 ENCOUNTER — Encounter: Payer: Self-pay | Admitting: *Deleted

## 2014-11-04 ENCOUNTER — Other Ambulatory Visit: Payer: Self-pay | Admitting: *Deleted

## 2014-11-04 VITALS — BP 94/62 | HR 80

## 2014-11-04 DIAGNOSIS — I10 Essential (primary) hypertension: Secondary | ICD-10-CM

## 2014-11-04 NOTE — Patient Outreach (Signed)
[facility]) Care Management   11/04/2014  [place] 1956-03-03 [id number]  George Valdez is an 59 y.o. male  Subjective: "I took a tumble again but I didn't get hurt."   Goals    . "I want my legs and feet to feel better so I can walk more."     Taking Gabapentin, narcotic analgesic as directed.     Marland Kitchen "I want to be able to get my hoveround out of the house"     Referral to social work for community resources for ramp building.     Marland Kitchen "I want to see if I can help with paying for my medicines and maybe get Medicaid. "     Social work referral. Pharmacy referral.     . Prevent Falls     Social work Media planner. Medication review/pharmacy referral.    . Quit smoking / using tobacco     Not ready to quit. Reviewed options/offered counseling. Patient considering.     . Record weight daily     No scale. Will obtain through [facility].         Objective:  George Valdez was referred to [facility] from his provider's office. George Valdez was recently hospitalized with encephalopathy. He says he thought he had a stroke. George Valdez is being followed by our [place] for community resources and psychosocial assessment and counseling. George Valdez is also being followed by our pharmacy team and our pharmacy manager, Deanne Coffer PharmD has seen George Valdez personally.   We are focusing on medication management/adherence, HTN disease management, home safety/fall risk, and community resources.     BP 94/62 mmHg  Pulse 80  SpO2 94%  Review of Systems  Constitutional: Negative.   Eyes: Negative.   Respiratory: Negative.   Cardiovascular: Negative.   Gastrointestinal: Negative.   Genitourinary: Negative.   Musculoskeletal: Positive for myalgias, joint pain and falls.       C/o right knee pain, chronic/persistent but worsened during cooler/wet weather  Skin: Negative.   Neurological: Positive for headaches.   Endo/Heme/Allergies: Negative.   Psychiatric/Behavioral: Positive for substance abuse.       H/o ETOH abuse    Physical Exam  Constitutional: He is oriented to person, place, and time. He appears well-developed and well-nourished. He is active.  Cardiovascular: Normal rate and regular rhythm.   Respiratory: Effort normal and breath sounds normal.  GI: Soft. Bowel sounds are normal.  Neurological: He is alert and oriented to person, place, and time.  Skin: Skin is warm, dry and intact.  Psychiatric: He has a normal mood and affect. His speech is normal and behavior is normal. Judgment and thought content normal.    Current Medications:   Current Outpatient Prescriptions  Medication Sig Dispense Refill  . albuterol (PROVENTIL HFA;VENTOLIN HFA) 108 (90 BASE) MCG/ACT inhaler Inhale 2 puffs into the lungs every 6 (six) hours as needed for wheezing or shortness of breath.    . ALPRAZolam (XANAX) 1 MG tablet Take 1 mg by mouth 2 (two) times daily as needed for anxiety.    Marland Kitchen aspirin EC 81 MG tablet Take 81 mg by mouth daily.    . baclofen (LIORESAL) 20 MG tablet Take 20 mg by mouth 2 (two) times daily as needed for muscle spasms.    . clopidogrel (PLAVIX) 75 MG tablet Take 75 mg by mouth daily.    . DULoxetine (CYMBALTA) 60 MG capsule Take 60 mg by mouth daily.    Marland Kitchen  enalapril (VASOTEC) 10 MG tablet Take 10 mg by mouth daily.    Marland Kitchen gabapentin (NEURONTIN) 800 MG tablet Take 800 mg by mouth 3 (three) times daily.    Marland Kitchen HYDROcodone-acetaminophen (NORCO/VICODIN) 5-325 MG per tablet Take 1 tablet by mouth 3 (three) times daily as needed for moderate pain.    . metoprolol succinate (TOPROL-XL) 50 MG 24 hr tablet Take 50 mg by mouth 2 (two) times daily. Take with or immediately following a meal.    . Multiple Vitamin (MULTIVITAMIN WITH MINERALS) TABS tablet Take 1 tablet by mouth daily.    . rosuvastatin (CRESTOR) 40 MG tablet Take 40 mg by mouth daily.    . tamsulosin (FLOMAX) 0.4 MG CAPS capsule Take  0.4 mg by mouth daily.    . traMADol (ULTRAM) 50 MG tablet Take 50 mg by mouth 4 (four) times daily as needed for moderate pain (patient states started on 10/15/14 by primary care for knee pain; reports taking approximately once daily).    . vitamin B-12 (CYANOCOBALAMIN) 1000 MCG tablet Take 1,000 mcg by mouth daily.    . Cyanocobalamin (B-12) 1000 MCG/ML KIT Inject 1,000 mcg as directed every 30 (thirty) days.    . folic acid (FOLVITE) 1 MG tablet Take 1 tablet (1 mg total) by mouth daily. (Patient not taking: Reported on 11/04/2014)    . isosorbide mononitrate (ISMO,MONOKET) 20 MG tablet Take 10 mg by mouth daily.    . rosuvastatin (CRESTOR) 20 MG tablet Take 20 mg by mouth daily.    Marland Kitchen thiamine 100 MG tablet Take 1 tablet (100 mg total) by mouth daily. (Patient not taking: Reported on 10/10/2014)    . tiotropium (SPIRIVA) 18 MCG inhalation capsule Place 18 mcg into inhaler and inhale daily as needed (for shortness of breath).      No current facility-administered medications for this visit.    Functional Status:   In your present state of health, do you have any difficulty performing the following activities: 10/02/2014 09/24/2014  Hearing? - N  Vision? - -  Difficulty concentrating or making decisions? - -  Walking or climbing stairs? - Y  Dressing or bathing? - N  Doing errands, shopping? - Y  Preparing Food and eating ? N -  Using the Toilet? N -  In the past six months, have you accidently leaked urine? N -  Do you have problems with loss of bowel control? N -  Managing your Medications? Y -  Managing your Finances? Y -  Housekeeping or managing your Housekeeping? Y -    Fall/Depression Screening:    PHQ 2/9 Scores 10/02/2014  PHQ - 2 Score 0    Assessment:  George Valdez is a 59 year old gentleman with HTN, CAD, PVD, and h/o acute encephalopathy secondary to alcohol withdrawal.    Medication Non-Adherence - George Valdez has historically been non-adherent with prescribed medication  regimen and has limited knowledge about medications. I performed pill count today for all controlled medications (please see medication review section for full assessment notes).   Xanax 69m: Patient states he is taking 2 times daily; last fill date 10/30/14; #46 on hand     Hydrocodone-Acetaminophen 5-235: Patient reports taking 2 or 3 tablets a day total; says he is taking daily and is not exceeding 3 tablets; last refill date: 10/28/14, tablets on hand 64    Tramadol 537m Filled 10/15/14; #69 on hand today    Chronic Health Condition (HTN) - He does not monitor blood pressure or  weight at home. He does not adhere to prescribed heart healthy/low sodium diet. His wife prepares meals and has not participated in any of our educational activities on my 2 visits. Please see care plan for activities.   Home Safety/Fall Risk - George Valdez is unable to use his electric wheelchair because he is unable to get it in/out of the house because the front entrance is unsafe (this is a rental property and landlord has declined to build ramp at this time although he did have small deck with steps built onto back of home). He has had multiple falls in the last 6 months but thus far has not sustained known injuries. He does complain of chronic persistent right knee pain which Delman Cheadle NP prescribed Tramadol on his last office visit. George Valdez also has tremors and has an appointment with Dr. Phillips Odor on May 17.   Plan:   Continued face to face RNCM visits at least monthly.  George Valdez will monitor his blood pressure daily and record.   George Valdez will work to adhere to medication regimen and will call for questions.   George Valdez will see Dr. Phillips Odor on 11/11/14.    PRESCRIPTION NEEDS: George Valdez is not taking Spiriva, Thiamine, or Folic Acid. He needs new prescriptions for each of these medications. Pharmacy is [place] in [place].   George Valdez says he and his wife were denied  for LIS/Extra Help. I will notify our Pharmacy Manager and ask for her guidance on follow up/further intervention and help.    PAPERWORK REQUEST ([place]): George Valdez mentioned that Aldona Bar, NP @ [facility] spoke with him about completing paperwork for a new Hoveround (he took to office). He would like a follow up call about progress.   George Valdez is interested in getting a handicap car hanger/sticker but was denied for lack of documentation outlining full time/long term disability. He is requesting follow up from Dr. Berenice Bouton office. We are happy to assist with this request if we can help.    RAMP NEED [facility] ):  [employer] evaluated George Valdez. [place]'s home and provided an estimate for a ramp build. There are no community funds currently available through [employer] to cover full cost of build. George Valdez. Ron said someone who came to his house said he would receive a return call in 4 days but he hasn't heard from anyone. I will discuss this issue/need further with [place] [facility] Social Worker Scott Forrest LCSW. See image of current home entrance in Environmental health practitioner.    [facility] CM Care Plan Problem One        Patient Outreach from 11/04/2014 in [place] Problem One  ADL/IADL Deficits related to previous stroke   Care Plan for Problem One  Active   THN Long Term Goal (31-90 days)  patient will receive appropriate resources for management of barriers creating fall risk over the next 60 days   THN Long Term Goal Start Date  10/02/14   THN CM Short Term Goal #1 (0-30 days)  patient will attend scheduled appointment with neurology provider in the next 30 days as evidenced by provider documentation of same   [facility] CM Short Term Goal #1 Start Date  10/02/14   Interventions for Short Term Goal #1  reviewed upcoming appointment schedule,  ensured transportation   [facility] CM Short Term Goal #2 (0-30 days)  patient will verbalize understanding of community resources available for ramp  for safe entry/exit of  home   Interventions for Short Term Goal #2  referred to LCSW for community resources    [place] Problem Two        Patient Outreach from 11/04/2014 in [place] Problem Two  Medication NonAdherence   Care Plan for Problem Two  Active   Interventions for Problem Two Long Term Goal   interviewed patient about current reasons for not taking medications, financial status assessment   THN Long Term Goal (31-90) days  patient will receive appropriate resources for success in medication adherence over the next 60 days   THN Long Term Goal Start Date  10/02/14   THN CM Short Term Goal #1 (0-30 days)  patient will work with [facility] pharmacy team to assess options for medication assistance in the next 30 days   THN CM Short Term Goal #1 Start Date  10/02/14   [facility] CM Short Term Goal #1 Met Date   10/10/14   Interventions for Short Term Goal #2   [place] [facility] pharmacy referral for consideration of LIS application, pharmacy assistance   THN CM Short Term Goal #2 (0-30 days)  patient will discuss financial assistance options with LCSW in the next 30 days    THN CM Short Term Goal #2 Start Date  10/02/14   [facility] CM Short Term Goal #2 Met Date  11/04/14   Interventions for Short Term Goal #2  [facility] Care Management social work referral made for financial counseling and possible Medicaid application    [place] Problem Three        Patient Outreach from 11/04/2014 in [place] Problem Three  Medication adherence   Care Plan for Problem Three  Active   THN Long Term Goal (31-90) days  George Valdez. [place] will take his medications as prescribed by his provider over the next 90 days as evidence by refill history and pill count if needed   THN Long Term Goal Start Date  10/10/14   Interventions for Problem Three Long Term Goal  Discussed adherence and watch him fill his pill box.  I recommended him not to take his [place] and  Alprazolam more than prescribed.     THN CM Short Term Goal #1 (0-30 days)  George Valdez. [place] will have his Spiriva filled in the next 30 days by evidence of him having it in his home or his refill history.   THN CM Short Term Goal #1 Start Date  10/10/14   Interventions for Short Term Goal #1  I educated him on the importance of taking it daily.  I will contact the provider to get a prescription called in to his pharmacy.   THN CM Short Term Goal #2 (0-30 days)  George Valdez. [place] will take his Vicoden as prescribed, no more than 3 day, as evidence by a pill count or refil history over the next 30 days.   THN CM Short Term Goal #2 Start Date  10/10/14   Interventions for Short Term Goal #2  I counted his Vicoden and educated him on the importance of taking it as prescribed and not taking more than 3 a day.   THN CM Short Term Goal #3 (0-30 days)  George Valdez. [place] will take his Alprazolam as prescribed, no more than 2 a day, as evidence by pill count or refill history over the next 30 days.    THN  CM Short Term Goal #3 Start  Date  10/10/14   Interventions for Short Term Goal #3  I counted his alprazolam and educated him on the importance of taking it as prescribed and not taking more than 2 a day.        [place] Management  [phone]

## 2014-11-06 ENCOUNTER — Other Ambulatory Visit: Payer: Self-pay | Admitting: *Deleted

## 2014-11-06 DIAGNOSIS — Z79899 Other long term (current) drug therapy: Secondary | ICD-10-CM

## 2014-11-06 NOTE — Patient Outreach (Signed)
[facility] [facility]) Care Management  11/06/2014  George Valdez 07/30/1955 [id number]   I spoke with Delman Cheadle NP today re: [name] medications. [name] was out of/not taking Spiriva, Thiamine, and Folic Acid and said he didn't have a prescription for them. When I spoke with [name] this morning she said these were not prescribed at her practice. It appears that they were prescribed when [name] was at the hospital. I spoke with [name] by phone to notify him of these and that I would provide this information to [name] for further consideration.   [name] and I also discussed pill counts for prescribed controlled medications.   I have a scheduled home visit with [name] in June.    [place] Management  [phone]

## 2014-11-14 ENCOUNTER — Other Ambulatory Visit: Payer: Self-pay

## 2014-11-14 ENCOUNTER — Other Ambulatory Visit: Payer: Self-pay | Admitting: *Deleted

## 2014-11-14 NOTE — Patient Outreach (Signed)
[place] [facility]) Care Management  11/14/2014  [place] 19-Jun-1956 [id number]   I called George Valdez today to see if I could make a brief visit to deliver a blood pressure monitor to him. He was visiting with our [place], Dr. Deanne Coffer. I delivered the monitor to George Valdez and provided instruction on use.   I have a face to face visit scheduled with George Valdez and will see him at home for follow up in June. George Valdez will check his bp daily and record in his [facility] Management  [phone]

## 2014-11-14 NOTE — Patient Outreach (Signed)
[facility]) Care Management  [place]   11/14/2014  [place] [facility] 1956/06/24 [id number]  Subjective: Mr. Vivona is a 59 year old male who was referred to me by Janalyn Shy, RN, nurse care manager to assist with medications adherence and affordability. I was able to complete the application for low income subsidy with Mr. Guertin and his wife at my last visit.  Upon talking with Mr. Spatafore, he stated he was denied it.  When I reached out to the pharmacy to determine what he is paying for his prescriptions, it is in line with the co-pays for low income subsidy/Extra Help. He is still interested in saving money.  I discussed using the mail order, [facility], with him and he is interested.  He is concerned that the medications would be sent to mailbox which would put them at risk of being stolen.  I will need to clarify if they can be delivered to the door.  Upon review of his medications, I determined he was taking his Hydrocodone-Acetaminophen three imes a day even which is appropriate and a decrease from previously.  He has been prescribed Tramadol which is also helping with his pain.  He is taking it appropriately. I also determined he has continued to take his alprazolam three times a day even though he has been counseled multiple time to take it twice a day as prescribed. He stated he was doing this because he forgot he was only supposed to take it twice a day. I explained to him he will not have enough of it now to last the entire month and he will not be able to fill it early. He will be at risk for withdrawal symptoms for the one to two weeks he will not have it.He also is using his Baclofen too much.  I discussed how he will run out of it before the month is over.  He stated he understood. He still does not have his Spiriva, folic acid and thiamine. Both folic acid and thiamine were prescribed from his most recent hospitalization. I was able to determine  he had Spiriva filled on 11/06/14 at [facility].  He stated he would have his wife pick it up today.   Objective:   Current Medications: Current Outpatient Prescriptions  Medication Sig Notes   . albuterol (PROVENTIL HFA;VENTOLIN HFA) 108 (90 BASE) MCG/ACT inhaler Inhale 2 puffs into the lungs every 6 (six) hours as needed for wheezing or shortness of breath.    . ALPRAZolam (XANAX) 1 MG tablet Take 1 mg by mouth 2 (two) times daily as needed for anxiety. He continues to take it three times a day   . aspirin EC 81 MG tablet Take 81 mg by mouth daily.    . baclofen (LIORESAL) 20 MG tablet Take 20 mg by mouth 2 (two) times daily as needed for muscle spasms. He has been taking it three times a day sometimes   . clopidogrel (PLAVIX) 75 MG tablet Take 75 mg by mouth daily.    . Cyanocobalamin (B-12) 1000 MCG/ML KIT Inject 1,000 mcg as directed every 30 (thirty) days.    . DULoxetine (CYMBALTA) 60 MG capsule Take 60 mg by mouth daily.    . enalapril (VASOTEC) 10 MG tablet Take 10 mg by mouth daily.    Marland Kitchen gabapentin (NEURONTIN) 800 MG tablet Take 800 mg by mouth 3 (three) times daily.    Marland Kitchen HYDROcodone-acetaminophen (NORCO/VICODIN) 5-325 MG per tablet Take 1 tablet by mouth  3 (three) times daily as needed for moderate pain.    . isosorbide mononitrate (ISMO,MONOKET) 20 MG tablet Take 10 mg by mouth daily.    . metoprolol succinate (TOPROL-XL) 50 MG 24 hr tablet Take 50 mg by mouth 2 (two) times daily. Take with or immediately following a meal.    . Multiple Vitamin (MULTIVITAMIN WITH MINERALS) TABS tablet Take 1 tablet by mouth daily.    . rosuvastatin (CRESTOR) 40 MG tablet Take 40 mg by mouth daily.    . tamsulosin (FLOMAX) 0.4 MG CAPS capsule Take 0.4 mg by mouth daily.    . traMADol (ULTRAM) 50 MG tablet Take 50 mg by mouth 4 (four) times daily as needed for moderate pain (patient states started on 10/15/14 by primary care for knee pain; reports taking approximately once daily).    .  vitamin B-12 (CYANOCOBALAMIN) 1000 MCG tablet Take 1,000 mcg by mouth daily.    . folic acid (FOLVITE) 1 MG tablet Take 1 tablet (1 mg total) by mouth daily. (Patient not taking: Reported on 11/04/2014) Not taking due to not having a prescription   .      . thiamine 100 MG tablet Take 1 tablet (100 mg total) by mouth daily. (Patient not taking: Reported on 10/10/2014) Not taking due to not having a prescription   . tiotropium (SPIRIVA) 18 MCG inhalation capsule Place 18 mcg into inhaler and inhale daily as needed (for shortness of breath).  Filling today    No current facility-administered medications for this visit.    Functional Status: In your present state of health, do you have any difficulty performing the following activities: 11/04/2014 10/02/2014  Hearing? N -  Vision? N -  Difficulty concentrating or making decisions? Y -  Walking or climbing stairs? Y -  Dressing or bathing? N -  Doing errands, shopping? Y -  Conservation officer, nature and eating ? Y N  Using the Toilet? N N  In the past six months, have you accidently leaked urine? N N  Do you have problems with loss of bowel control? N N  Managing your Medications? Y Y  Managing your Finances? N Y  Housekeeping or managing your Housekeeping? Tempie Donning    Fall/Depression Screening: PHQ 2/9 Scores 11/04/2014 10/02/2014  PHQ - 2 Score 0 0    Assessment: 1. Medication affordability: Mr. Messer needs assistance affording his medications. He has low income subsidy based on his co-pays.  His insurance card is too old to get the numbers off of it to check [email or web address].  He can get his tier 1 and 2 medications for free from the mail order pharmacy.   2. Medication adherence: Mr. Degollado is overusing his Alprazolam and Baclofen. 3.  Spiriva:  He has an active prescription at [facility] which he will pick up today.  Plan: 1.  I will follow up with [facility] to determine if the medications can be delivered to his door and not the mailbox.    2.  I educated Mr. Uhde again about not overusing his Alprazolam and Baclofen.  I suggested filling his pill box with only the prescribed amount of medications to prevent overusing them.   3.  Mr. Hidrogo will pick up his Spiriva today and start using it.   4.  I will follow up with Janalyn Shy to determine if she was able to get the provider to write prescriptions for Thiamine and Folic Acid. 5.  I will follow up with Mr. [place]allon in 2 weeks  to discuss the mailorder option and continue with medication adherence monitoring.    Deanne Coffer, PharmD, [place] [phone]

## 2014-11-17 ENCOUNTER — Other Ambulatory Visit: Payer: Self-pay | Admitting: *Deleted

## 2014-11-17 DIAGNOSIS — I10 Essential (primary) hypertension: Secondary | ICD-10-CM

## 2014-11-17 NOTE — Patient Outreach (Signed)
[place] [facility]) Care Management  11/17/2014  George Valdez 1955/07/21 [id number]  Returned a call to Mr. Musich. He was unsure how often he is to check his bp on his new machine provided by [facility] CM. I advised him to check it once daily. He is going to check it first thing in the morning on M/W/F and in the afternoon or evening on other days so that we can see if his bp is stable throughout the day.    [place] Management  [phone]

## 2014-11-27 ENCOUNTER — Other Ambulatory Visit: Payer: Self-pay

## 2014-11-27 NOTE — Patient Outreach (Signed)
[facility]) Care Management  [place]   11/27/2014  George Valdez [facility] 23-Jun-1956 [id number]  Subjective: George Valdez is a 59 year old male who I am following for medication adherence.  He has all his medications today except for the Thiamine and Folic acid, which he was told to continue after his hospitalization but no prescriptions were given to him.  He is doing better with his adherence to alprazolam, hydrocodone-acetaminophen, tramadol, and baclofen.  He has taken the appropriate amount sine my visit 2 weeks ago.  He was able to extend the alprazolam to only be out of it for a week by taking 1/2 a tablet at lunch and a full tablet at night.  He has Extra Help so he will not qualify for any other patient assistance.  I was not able to get a response to see if [facility] will deliver his medications to his door.    Objective:   Current Medications: Current Outpatient Prescriptions  Medication Sig Dispense Refill  . albuterol (PROVENTIL HFA;VENTOLIN HFA) 108 (90 BASE) MCG/ACT inhaler Inhale 2 puffs into the lungs every 6 (six) hours as needed for wheezing or shortness of breath.    Marland Kitchen aspirin EC 81 MG tablet Take 81 mg by mouth daily.    . baclofen (LIORESAL) 20 MG tablet Take 20 mg by mouth 2 (two) times daily as needed for muscle spasms.    . clopidogrel (PLAVIX) 75 MG tablet Take 75 mg by mouth daily.    . Cyanocobalamin (B-12) 1000 MCG/ML KIT Inject 1,000 mcg as directed every 30 (thirty) days.    . DULoxetine (CYMBALTA) 60 MG capsule Take 60 mg by mouth daily.    . enalapril (VASOTEC) 10 MG tablet Take 10 mg by mouth daily.    Marland Kitchen gabapentin (NEURONTIN) 800 MG tablet Take 800 mg by mouth 3 (three) times daily.    Marland Kitchen HYDROcodone-acetaminophen (NORCO/VICODIN) 5-325 MG per tablet Take 1 tablet by mouth 3 (three) times daily as needed for moderate pain.    . isosorbide mononitrate (ISMO,MONOKET) 20 MG tablet Take 10 mg by mouth daily.    . metoprolol succinate  (TOPROL-XL) 50 MG 24 hr tablet Take 50 mg by mouth 2 (two) times daily. Take with or immediately following a meal.    . Multiple Vitamin (MULTIVITAMIN WITH MINERALS) TABS tablet Take 1 tablet by mouth daily.    . rosuvastatin (CRESTOR) 40 MG tablet Take 40 mg by mouth daily.    . tamsulosin (FLOMAX) 0.4 MG CAPS capsule Take 0.4 mg by mouth daily.    Marland Kitchen tiotropium (SPIRIVA) 18 MCG inhalation capsule Place 18 mcg into inhaler and inhale daily as needed (for shortness of breath).     . traMADol (ULTRAM) 50 MG tablet Take 50 mg by mouth 4 (four) times daily as needed for moderate pain (patient states started on 10/15/14 by primary care for knee pain; reports taking approximately once daily).    . vitamin B-12 (CYANOCOBALAMIN) 1000 MCG tablet Take 1,000 mcg by mouth daily.    Marland Kitchen ALPRAZolam (XANAX) 1 MG tablet Take 1 mg by mouth 2 (two) times daily as needed for anxiety.    . folic acid (FOLVITE) 1 MG tablet Take 1 tablet (1 mg total) by mouth daily. (Patient not taking: Reported on 11/04/2014)    . rosuvastatin (CRESTOR) 20 MG tablet Take 20 mg by mouth daily.    Marland Kitchen thiamine 100 MG tablet Take 1 tablet (100 mg total) by mouth daily. (Patient not  taking: Reported on 10/10/2014)     No current facility-administered medications for this visit.    Functional Status: In your present state of health, do you have any difficulty performing the following activities: 11/04/2014 10/02/2014  Hearing? N -  Vision? N -  Difficulty concentrating or making decisions? Y -  Walking or climbing stairs? Y -  Dressing or bathing? N -  Doing errands, shopping? Y -  Conservation officer, nature and eating ? Y N  Using the Toilet? N N  In the past six months, have you accidently leaked urine? N N  Do you have problems with loss of bowel control? N N  Managing your Medications? Y Y  Managing your Finances? N Y  Housekeeping or managing your Housekeeping? Tempie Donning    Fall/Depression Screening: PHQ 2/9 Scores 11/04/2014 10/02/2014  PHQ - 2  Score 0 0    Assessment: 1.  Medication adherence:  He is more adherent to his medications over the last two weeks.  He has all his medications today.   Plan: 1.  I will follow up Mr.  Dillin in 1 month to reassess him adherence to his medications.

## 2014-12-03 ENCOUNTER — Other Ambulatory Visit: Payer: Self-pay | Admitting: Licensed Clinical Social Worker

## 2014-12-03 NOTE — Patient Outreach (Signed)
Assessment:  CSW received phone call from client on 12/03/14.  CSW verified identity of client.  Client informed CSW that client had a land line number now of [phone]. Client continues to have cell phone number of [phone].  CSW and client spoke of ramp construction status. Client said he had met with a representative of Aging, Disability and [employer] at his home several months ago and that he had completed requested paperwork for applying for ramp construction assistance.  CSW had been informed from [employer] representative in April that it may be June of 2016 or July of 2016 before ramp construction funds were again available for [employer] to help with ramp building project. CSW reminded client of this information on 12/03/14. Client said he had his prescribed medications and that he was attending scheduled medical appointments. He said he will see Dr. Ethlyn Gallery, primary care doctor for  client, in July of 2016 for an appointment. He said he has support from his wife; and, he said he has to go slowly up and down steps to enter or leave  his home.  He is hoping to have a ramp built soon to help him get in and out of his home easily and also to help him utilize an IT trainer wheelchair which he already owns.   CSW and client agreed for CSW to call [employer] in the next week to see if CSW can learn more about the time line related to possible ramp construction for client.   Plan: Client to attend scheduled medical appointments and to take medications as prescribed. CSW to collaborate with RN Janalyn Shy in monitoring needs of client.  CSW to call client in two weeks to assess needs of client.   Norva Riffle.Alston Berrie MSW, LCSW Licensed Clinical Social Worker [facility] Care Management [phone]

## 2014-12-04 ENCOUNTER — Ambulatory Visit (HOSPITAL_COMMUNITY): Payer: Medicare Other | Attending: Family Medicine

## 2014-12-09 ENCOUNTER — Encounter: Payer: Self-pay | Admitting: *Deleted

## 2014-12-09 ENCOUNTER — Other Ambulatory Visit: Payer: Self-pay | Admitting: *Deleted

## 2014-12-09 NOTE — Patient Outreach (Signed)
[facility]) Care Management   12/09/2014  [place] [birth year] [id number]  Brittain Hosie Rehberg is a 59 y.o. male  Subjective: Mr. Hehn was referred to [facility] from his provider's office after a hospitalization for encephalopathy. Mr. Pavon is being followed by our [place] for community resources and psychosocial assessment and counseling. Mr. Bergdoll is also being followed by our pharmacy team and our pharmacy manager, Deanne Coffer PharmD has seen Mr. Vey personally for medication management.  We are focusing on medication management/adherence, HTN disease management, home safety/fall risk, and community resources.   Marland Kitchengoal  Objective:  BP 110/80 mmHg  Pulse 83  Ht 1.6 m ('5\' 3"' )  Wt 208 lb (94.348 kg)  BMI 36.85 kg/m2  SpO2 96%  Review of Systems  Constitutional: Negative.   HENT: Negative.   Eyes: Negative.   Respiratory: Negative.   Cardiovascular: Negative.   Gastrointestinal: Negative.   Genitourinary: Negative.   Musculoskeletal: Positive for falls.       See assessment portion of this note  Skin: Negative.   Neurological: Positive for dizziness.  Endo/Heme/Allergies: Negative.   Psychiatric/Behavioral: Negative.     Physical Exam  Constitutional: He is oriented to person, place, and time. He appears well-developed and well-nourished.  Cardiovascular: Normal rate, regular rhythm and normal heart sounds.   Respiratory: Effort normal and breath sounds normal.  GI: Soft.  Neurological: He is alert and oriented to person, place, and time.  Skin: Skin is warm.  Psychiatric: He has a normal mood and affect. His behavior is normal. Judgment and thought content normal.    Current Medications:   Current Outpatient Prescriptions  Medication Sig Dispense Refill  . albuterol (PROVENTIL HFA;VENTOLIN HFA) 108 (90 BASE) MCG/ACT inhaler Inhale 2 puffs into the lungs every 6 (six) hours as needed for wheezing  or shortness of breath.    . ALPRAZolam (XANAX) 1 MG tablet Take 1 mg by mouth 2 (two) times daily as needed for anxiety.    Marland Kitchen aspirin EC 81 MG tablet Take 81 mg by mouth daily.    . baclofen (LIORESAL) 20 MG tablet Take 20 mg by mouth 2 (two) times daily as needed for muscle spasms.    . clopidogrel (PLAVIX) 75 MG tablet Take 75 mg by mouth daily.    . Cyanocobalamin (B-12) 1000 MCG/ML KIT Inject 1,000 mcg as directed every 30 (thirty) days.    . DULoxetine (CYMBALTA) 60 MG capsule Take 60 mg by mouth daily.    . enalapril (VASOTEC) 10 MG tablet Take 10 mg by mouth daily.    . folic acid (FOLVITE) 1 MG tablet Take 1 tablet (1 mg total) by mouth daily. (Patient not taking: Reported on 11/04/2014)    . gabapentin (NEURONTIN) 800 MG tablet Take 800 mg by mouth 3 (three) times daily.    Marland Kitchen HYDROcodone-acetaminophen (NORCO/VICODIN) 5-325 MG per tablet Take 1 tablet by mouth 3 (three) times daily as needed for moderate pain.    . isosorbide mononitrate (ISMO,MONOKET) 20 MG tablet Take 10 mg by mouth daily.    . metoprolol succinate (TOPROL-XL) 50 MG 24 hr tablet Take 50 mg by mouth 2 (two) times daily. Take with or immediately following a meal.    . Multiple Vitamin (MULTIVITAMIN WITH MINERALS) TABS tablet Take 1 tablet by mouth daily.    . rosuvastatin (CRESTOR) 20 MG tablet Take 20 mg by mouth daily.    . rosuvastatin (CRESTOR) 40 MG tablet Take 40 mg by  mouth daily.    . tamsulosin (FLOMAX) 0.4 MG CAPS capsule Take 0.4 mg by mouth daily.    Marland Kitchen thiamine 100 MG tablet Take 1 tablet (100 mg total) by mouth daily. (Patient not taking: Reported on 10/10/2014)    . tiotropium (SPIRIVA) 18 MCG inhalation capsule Place 18 mcg into inhaler and inhale daily as needed (for shortness of breath).     . traMADol (ULTRAM) 50 MG tablet Take 50 mg by mouth 4 (four) times daily as needed for moderate pain (patient states started on 10/15/14 by primary care for knee pain; reports taking approximately once daily).    .  vitamin B-12 (CYANOCOBALAMIN) 1000 MCG tablet Take 1,000 mcg by mouth daily.     No current facility-administered medications for this visit.    Functional Status:   In your present state of health, do you have any difficulty performing the following activities: 11/04/2014 10/02/2014  Hearing? N -  Vision? N -  Difficulty concentrating or making decisions? Y -  Walking or climbing stairs? Y -  Dressing or bathing? N -  Doing errands, shopping? Y -  Conservation officer, nature and eating ? Y N  Using the Toilet? N N  In the past six months, have you accidently leaked urine? N N  Do you have problems with loss of bowel control? N N  Managing your Medications? Y Y  Managing your Finances? N Y  Housekeeping or managing your Housekeeping? Tempie Donning    Fall/Depression Screening:    PHQ 2/9 Scores 11/04/2014 10/02/2014  PHQ - 2 Score 0 0    Assessment:    Medication Review    Alprazolam - #60 filled on 6/3; 24 taken/36 on hand; patient states he is taking 1 tablet twice daily - one around lunch and one at bedtime; states he only takes 1/2 if he isn't feeling stressed out during the day   Hydrocodone/APAP - #90 filled on 6/3: 21 taken/69 on hand; states he takes up to 3 tablets daily, sometimes taking 2-3 tabs at one time but no more on that particular day  Out of the following meds and will call for refills today: Duloxetine, Flomax, Tramadol, Vit B12 injection Spiriva - one cap on hand; being filled at [facility] but will ask for transfer today when he calls other refills to [facility] ([facility]) - Mr. Lantry is now checking his own blood pressures about every other day to every third day; his highest recorded bp is 156/74 and his lowest pressure was 91/57; see medication list/review for medication adherence; Mr. Siebert is not adhering to heart healthy diet but is trying to add vegetables to his diet; Mr. Mcmanaway is down to 5 cigarettes a day! He says he got a new car and  made it an "off limits" area and also worked hard to cut down on number of cigarettes per day; he says he is trying to only smoke if he is "really nervous" and "after meals" and "one when I wake up"  Home Safety/Fall Risk - a volunteer came to Mr. Deike's home today while I was visiting and examined the front porch; he told Mr. Schear that he plans to tear off the front porch/deck and build a 22 ft sloped ramp and front porch at no cost to Mr. Badalamenti; he was to check with his supervisor regarding timeline; Mr. Bither is very excited about this because it means he will be able to use his Research officer, political party  Mr. Nippert tells me he had 2 falls in the last month:  1 - upon rising quickly when getting up off the sofa; I reviewed with him the importance of sitting, slowly rising, and taking off slowly when getting up from bed or a seated position 2 - while cooking; he stumbled and fell, hitting his back on the kitchen table; he denies any injuries   Plan:   Mr. Stauber will call [place] for medication refills today Mr. Tozer will continue working on smoking cessation Mr. Garczynski will practice new method of rising slowly when getting up from bed or a seated position  PharmD visit 12/25/14 RNCM Visit 12/30/14   [facility] CM Care Plan Problem Two        Patient Outreach from 12/09/2014 in [place] Problem Two  Medication NonAdherence   Care Plan for Problem Two  Active   Interventions for Problem Two Long Term Goal   utilizing teachback method, discussed with patient the importance of developing a plan for saving money for needed medications each time he is paid and discussing plans for budget/management further with LCSW,  pill counts perfromed today   [facility] Long Term Goal (31-90) days  Patient will show continued improvement towards 100% adherence to prescribed medication regimen over the next 60 days as evidenced by pill counts and verbalization of plan for  financial management to avoid running out of money for medications each month   [facility] Long Term Goal Start Date  12/09/14   [facility] CM Short Term Goal #1 (0-30 days)  patient will work with [place] pharmacy manager this month on adherence plan as scheduled   [facility] CM Short Term Goal #1 Start Date  12/09/14   Interventions for Short Term Goal #2   reviewed scheduled visit by pharmacist 6/30 with patient   [facility] CM Short Term Goal #2 (0-30 days)  patient will discuss budget management with LCSW over the next 30 days   [facility] CM Short Term Goal #2 Start Date  12/09/14   Interventions for Short Term Goal #2  notified LCSW of ongoing concerns about financial management      [place] Care Management  [phone]

## 2014-12-10 ENCOUNTER — Other Ambulatory Visit: Payer: Self-pay | Admitting: Licensed Clinical Social Worker

## 2014-12-10 NOTE — Patient Outreach (Signed)
  Assessment:  CSW called home phone number of client on 12/10/14. CSW spoke via phone with client on 12/10/14. CSW verified identity of client.  CSW and client spoke of ramp construction status for client. Client said a volunteer came to his home yesterday to assess/evaluate ramp construction needs of client. Volunteer to speak with Windy Kalata at [facility] [employer] in [place], [place] to further discuss plan for ramp construction for client. CSW encouraged client to cooperate with volunteer group seeking to construct ramp for client.  Windy Kalata had spoken on 12/09/14 with CSW and spoke with CSW about available funds for ramp project for client.  Client is excited about possible ramp construction. Related to care plan goals for client, RN had spoken with client about budget management and saving funds to purchase needed monthly medications for client.  CSW and client spoke on 12/10/14 about budget plan/budget management for client each month. CSW encouraged client to save money when he is paid each month to be able to purchase his needed prescribed monthly medications as needed. Client said he would try to start putting some money back at each payday to help him have funds for needed medications. CSW and client spoke of his medical bills owed.  Client said he usually paid his copay amount due to Dr. Ethlyn Gallery at each visit.  CSW encouraged client to develop and follow a monthly budget to help client with affording needed medications. Client has been doing very well in working with [facility] RN, has been cutting back on smoking to about 5 cigarettes daily, has been going to scheduled medical appointments, has been careful in walking and trying to avoid falls and has generally been very cooperative with [facility] program staff. CSW congratulated client on progress of client and encouraged him to work toward care plan goals and to be patient as he worked with Aging , Disability and Scientist, research (life sciences) in  Therapist, occupational for client.    Plan: Client to save some money on each payday for client  In order to have needed funds to purchase prescribed monthly medications for client. Client to take medications as prescribed and to attend scheduled medical appointments. CSW to communicate above information to RN Janalyn Shy on 12/10/14. CSW to call client in three weeks to assess needs of client at that time.   Norva Riffle.Bharat Antillon MSW, LCSW Licensed Clinical Social Worker [facility] Care Management [phone]

## 2014-12-25 ENCOUNTER — Other Ambulatory Visit: Payer: Self-pay

## 2014-12-26 DIAGNOSIS — Z6832 Body mass index (BMI) 32.0-32.9, adult: Secondary | ICD-10-CM | POA: Diagnosis not present

## 2014-12-26 DIAGNOSIS — Z1389 Encounter for screening for other disorder: Secondary | ICD-10-CM | POA: Diagnosis not present

## 2014-12-26 DIAGNOSIS — E6609 Other obesity due to excess calories: Secondary | ICD-10-CM | POA: Diagnosis not present

## 2014-12-26 DIAGNOSIS — I739 Peripheral vascular disease, unspecified: Secondary | ICD-10-CM | POA: Diagnosis not present

## 2014-12-26 DIAGNOSIS — G64 Other disorders of peripheral nervous system: Secondary | ICD-10-CM | POA: Diagnosis not present

## 2014-12-26 DIAGNOSIS — I10 Essential (primary) hypertension: Secondary | ICD-10-CM | POA: Diagnosis not present

## 2014-12-26 DIAGNOSIS — I639 Cerebral infarction, unspecified: Secondary | ICD-10-CM | POA: Diagnosis not present

## 2014-12-26 NOTE — Patient Outreach (Signed)
[facility]) Care Management  [place]   12/25/2014  Capers Hagmann [facility] 31-Oct-1955 [id number]  Subjective: Mr. Tagle is a 59 year old male who I am following for medication adherence.  He has all his medications today except for the isosorbide mononitrate.  He ran out of it yesterday and will pick it up after July 3rd.  He is doing better with his adherence to alprazolam, hydrocodone-acetaminophen, tramadol, and baclofen.  He has taken the appropriate amount sine my visit 4 weeks ago.  Since starting on his Spiriva daily, he has not required as much Ventolin.  He is interested in getting his medications synchronized but [place] does not offer it.    Objective:   Current Medications: Current Outpatient Prescriptions  Medication Sig Dispense Refill  . ALPRAZolam (XANAX) 1 MG tablet Take 1 mg by mouth 2 (two) times daily as needed for anxiety.    Marland Kitchen aspirin EC 81 MG tablet Take 81 mg by mouth daily.    . baclofen (LIORESAL) 20 MG tablet Take 20 mg by mouth 2 (two) times daily as needed for muscle spasms.    . clopidogrel (PLAVIX) 75 MG tablet Take 75 mg by mouth daily.    . Cyanocobalamin (B-12) 1000 MCG/ML KIT Inject 1,000 mcg as directed every 30 (thirty) days.    . DULoxetine (CYMBALTA) 60 MG capsule Take 60 mg by mouth daily.    . enalapril (VASOTEC) 10 MG tablet Take 10 mg by mouth daily.    Marland Kitchen gabapentin (NEURONTIN) 800 MG tablet Take 800 mg by mouth 3 (three) times daily.    Marland Kitchen HYDROcodone-acetaminophen (NORCO/VICODIN) 5-325 MG per tablet Take 1 tablet by mouth 3 (three) times daily as needed for moderate pain.    . metoprolol succinate (TOPROL-XL) 50 MG 24 hr tablet Take 50 mg by mouth 2 (two) times daily. Take with or immediately following a meal.    . Multiple Vitamin (MULTIVITAMIN WITH MINERALS) TABS tablet Take 1 tablet by mouth daily.    . rosuvastatin (CRESTOR) 40 MG tablet Take 40 mg by mouth daily.    . tamsulosin (FLOMAX) 0.4 MG CAPS  capsule Take 0.4 mg by mouth daily.    Marland Kitchen tiotropium (SPIRIVA) 18 MCG inhalation capsule Place 18 mcg into inhaler and inhale daily as needed (for shortness of breath).     . traMADol (ULTRAM) 50 MG tablet Take 50 mg by mouth 4 (four) times daily as needed for moderate pain (patient states started on 10/15/14 by primary care for knee pain; reports taking approximately once daily).    . vitamin B-12 (CYANOCOBALAMIN) 1000 MCG tablet Take 1,000 mcg by mouth daily.    Marland Kitchen albuterol (PROVENTIL HFA;VENTOLIN HFA) 108 (90 BASE) MCG/ACT inhaler Inhale 2 puffs into the lungs every 6 (six) hours as needed for wheezing or shortness of breath.    . folic acid (FOLVITE) 1 MG tablet Take 1 tablet (1 mg total) by mouth daily. (Patient not taking: Reported on 11/04/2014)    . isosorbide mononitrate (ISMO,MONOKET) 20 MG tablet Take 10 mg by mouth daily.    . rosuvastatin (CRESTOR) 20 MG tablet Take 20 mg by mouth daily.    Marland Kitchen thiamine 100 MG tablet Take 1 tablet (100 mg total) by mouth daily. (Patient not taking: Reported on 10/10/2014)     No current facility-administered medications for this visit.    Functional Status: In your present state of health, do you have any difficulty performing the following activities: 11/04/2014 10/02/2014  Hearing? N -  Vision? N -  Difficulty concentrating or making decisions? Y -  Walking or climbing stairs? Y -  Dressing or bathing? N -  Doing errands, shopping? Y -  Conservation officer, nature and eating ? Y N  Using the Toilet? N N  In the past six months, have you accidently leaked urine? N N  Do you have problems with loss of bowel control? N N  Managing your Medications? Y Y  Managing your Finances? N Y  Housekeeping or managing your Housekeeping? Tempie Donning    Fall/Depression Screening: PHQ 2/9 Scores 11/04/2014 10/02/2014  PHQ - 2 Score 0 0    Assessment: 1.  Medication adherence:  He is adherent to his medications and the only limitation to having all his medications is  financial.  Plan: 1.  Mr. Woolstenhulme has met his goals with me.  He is adherent to his medications now.  There are no more pharmacy barriers for him. 2.  I will close his care to pharmacy.  I will be happy to follow up on an as needed basis for any further pharmacy issues.  3.  I have notified his nurse care manager, Janalyn Shy, RN on the closure as well.   [facility] CM Care Plan Problem One        Patient Outreach Telephone from 12/10/2014 in [place] CM Short Term Goal #2 (0-30 days)  patient will verbalize understanding of community resources available for ramp for safe entry/exit of home   [facility] CM Short Term Goal #2 Met Date  12/09/14   Interventions for Short Term Goal #2  CSW and client spoke on 12/10/14 about ramp constuction project for client. Volunteer group is working with Aging, disability and transient services to seek direction and funding regarding approval for ramp Architect at home of client.     [place] Plan Problem Two        Patient Outreach Telephone from 12/10/2014 in [place] Problem Two  Medication NonAdherence   Care Plan for Problem Two  Active   Interventions for Problem Two Long Term Goal   CSW and client spoke on 12/10/14 about budget plan for client and client's saving money each month to afford his prescribed medications. Client said he would start saving some money at each payday for client  in order to have funds to buy his monthly prescribed medicaions.   THN Long Term Goal (31-90) days  Patient will show continued improvement towards 100% adherence to prescribed medication regimen over the next 60 days as evidenced by pill counts and verbalization of plan for financial management to avoid running out of money for medications each month   THN Long Term Goal Start Date  12/09/14    [facility] CM Care Plan Problem Three        Patient Outreach from 12/25/2014 in [place] Problem Three  Medication  adherence   Care Plan for Problem Three  Active   THN Long Term Goal (31-90) days  Mr. Westrup will take his medications as prescribed by his provider over the next 90 days as evidence by refill history and pill count if needed   THN Long Term Goal Start Date  10/10/14   [facility] Long Term Goal Met Date  12/25/14   Interventions for Problem Three Long Term Goal  Mr. Fessel has met this goal.  He is taking his medications regularly with  the exception of missing a few days due to not having the money to buy them.  He gets them very soon after getting paid.   THN CM Short Term Goal #1 (0-30 days)  Mr. Kloster will have his Spiriva filled in the next 30 days by evidence of him having it in his home or his refill history.   THN CM Short Term Goal #1 Start Date  10/10/14   [facility] CM Short Term Goal #1 Met Date  11/14/14   Interventions for Short Term Goal #1  Mr. Pandey was able to find an old box of Spiriva and he was able to refill it at [facility].  I requested that he pick up the Spiriva today.     THN CM Short Term Goal #2 (0-30 days)  Mr. Ofallon will take his [place] as prescribed, no more than 3 day, as evidence by a pill count or refil history over the next 30 days.   THN CM Short Term Goal #2 Start Date  10/10/14   [facility] CM Short Term Goal #2 Met Date  11/14/14   Interventions for Short Term Goal #2  I counted his Vicoden and he is taking no more than 3 tabs a day.     THN CM Short Term Goal #3 (0-30 days)  Mr. Jaggers will take his Alprazolam as prescribed, no more than 2 a day, as evidence by pill count or refill history over the next 30 days.    THN  CM Short Term Goal #3 Start Date  11/27/14   [facility] CM Short Term Goal #3 Met Date  12/25/14   Interventions for Short Term Goal #3  I counted his alprazolam and he is not taking more than 2 a day.         Deanne Coffer, PharmD, [place] [phone]

## 2014-12-30 ENCOUNTER — Encounter: Payer: Self-pay | Admitting: *Deleted

## 2014-12-30 ENCOUNTER — Other Ambulatory Visit: Payer: Self-pay | Admitting: *Deleted

## 2014-12-30 NOTE — Patient Outreach (Signed)
[facility]) Care Management   12/30/2014  [place] September 01, 1955 [id number]  Hashir Deleeuw Kaupp is an 59 y.o. male  Subjective: Mr. Gauthreaux was referred to [place] [facility] from his provider's office after a hospitalization for encephalopathy. Mr. Tortorella is being followed by our [place] for community resources and psychosocial assessment and counseling. Mr. Chacko is also being followed by our pharmacy team and our pharmacy manager, Deanne Coffer PharmD has seen Mr. Imhoff personally for medication management.  We are focusing on medication management/adherence, HTN disease management, home safety/fall risk, and community resources.    Objective:  BP 108/70 mmHg  Pulse 77  Ht 1.6 m ('5\' 3"' )  Wt 218 lb (98.884 kg)  BMI 38.63 kg/m2  SpO2 95%  Review of Systems  Constitutional: Negative.   HENT: Negative.   Eyes: Negative.   Respiratory: Negative.   Cardiovascular: Negative.   Gastrointestinal: Negative.   Genitourinary: Negative.   Musculoskeletal: Positive for myalgias, joint pain and falls.       Reports "about 20" falls this month  Skin: Negative.   Neurological: Positive for dizziness. Negative for loss of consciousness.  Endo/Heme/Allergies: Negative.   Psychiatric/Behavioral: Negative.     Physical Exam  Constitutional: He is oriented to person, place, and time. Vital signs are normal. He appears well-developed and well-nourished. He is active.  Cardiovascular: Normal rate and regular rhythm.   Respiratory: Effort normal and breath sounds normal.  GI: Soft. Bowel sounds are normal.  Neurological: He is alert and oriented to person, place, and time. Gait abnormal.  Last month patient reported to me that he had "a couple" of falls caused by sudden onset of dizziness when he was standing.   This month patient reports "about 20" falls in the last month again stating he has sudden onset of dizziness OR loss of balance  usually while standing.   Skin: Skin is warm, dry and intact.  Psychiatric: He has a normal mood and affect. His speech is normal and behavior is normal. Judgment and thought content normal. Cognition and memory are normal.    Current Medications:   Current Outpatient Prescriptions  Medication Sig Dispense Refill  . albuterol (PROVENTIL HFA;VENTOLIN HFA) 108 (90 BASE) MCG/ACT inhaler Inhale 2 puffs into the lungs every 6 (six) hours as needed for wheezing or shortness of breath.    . ALPRAZolam (XANAX) 1 MG tablet Take 1 mg by mouth 2 (two) times daily as needed for anxiety.    Marland Kitchen aspirin EC 81 MG tablet Take 81 mg by mouth daily.    . baclofen (LIORESAL) 20 MG tablet Take 20 mg by mouth 2 (two) times daily as needed for muscle spasms.    . clopidogrel (PLAVIX) 75 MG tablet Take 75 mg by mouth daily.    . Cyanocobalamin (B-12) 1000 MCG/ML KIT Inject 1,000 mcg as directed every 30 (thirty) days.    . DULoxetine (CYMBALTA) 60 MG capsule Take 60 mg by mouth daily.    . enalapril (VASOTEC) 10 MG tablet Take 10 mg by mouth daily.    . folic acid (FOLVITE) 1 MG tablet Take 1 tablet (1 mg total) by mouth daily. (Patient not taking: Reported on 11/04/2014)    . gabapentin (NEURONTIN) 800 MG tablet Take 800 mg by mouth 3 (three) times daily.    Marland Kitchen HYDROcodone-acetaminophen (NORCO/VICODIN) 5-325 MG per tablet Take 1 tablet by mouth 3 (three) times daily as needed for moderate pain.    . isosorbide mononitrate (  ISMO,MONOKET) 20 MG tablet Take 10 mg by mouth daily.    . metoprolol succinate (TOPROL-XL) 50 MG 24 hr tablet Take 50 mg by mouth 2 (two) times daily. Take with or immediately following a meal.    . Multiple Vitamin (MULTIVITAMIN WITH MINERALS) TABS tablet Take 1 tablet by mouth daily.    . rosuvastatin (CRESTOR) 20 MG tablet Take 20 mg by mouth daily.    . rosuvastatin (CRESTOR) 40 MG tablet Take 40 mg by mouth daily.    . tamsulosin (FLOMAX) 0.4 MG CAPS capsule Take 0.4 mg by mouth daily.    Marland Kitchen  thiamine 100 MG tablet Take 1 tablet (100 mg total) by mouth daily. (Patient not taking: Reported on 10/10/2014)    . tiotropium (SPIRIVA) 18 MCG inhalation capsule Place 18 mcg into inhaler and inhale daily as needed (for shortness of breath).     . traMADol (ULTRAM) 50 MG tablet Take 50 mg by mouth 4 (four) times daily as needed for moderate pain (patient states started on 10/15/14 by primary care for knee pain; reports taking approximately once daily).    . vitamin B-12 (CYANOCOBALAMIN) 1000 MCG tablet Take 1,000 mcg by mouth daily.     No current facility-administered medications for this visit.    Functional Status:   In your present state of health, do you have any difficulty performing the following activities: 11/04/2014 10/02/2014  Hearing? N -  Vision? N -  Difficulty concentrating or making decisions? Y -  Walking or climbing stairs? Y -  Dressing or bathing? N -  Doing errands, shopping? Y -  Conservation officer, nature and eating ? Y N  Using the Toilet? N N  In the past six months, have you accidently leaked urine? N N  Do you have problems with loss of bowel control? N N  Managing your Medications? Y Y  Managing your Finances? N Y  Housekeeping or managing your Housekeeping? Tempie Donning    Fall/Depression Screening:    PHQ 2/9 Scores 11/04/2014 10/02/2014  PHQ - 2 Score 0 0    Assessment:  Medication Review   Alprazolam - #60 filled on 12/26/14 - 51 on hand; patient states he is taking 1 tablet twice daily - one around lunch and one at bedtime; states he only takes 1/2 if he isn't feeling stressed out during the day   Hydrocodone/APAP -  Filled #90 on 12/26/14 - 96 in bottle; states he put the few he had left from previous bottle into new bottle ; states he takes up to 3 tablets daily, sometimes taking 2-3 tabs at one time but no more on that particular day  Chronic Health Condition (HTN) - Mr. Moffit is now checking his own blood pressures DAILY and recording along with self administration  of blood pressure medications (see documents in Environmental health practitioner); see medication list/review for medication adherence (drastically improved); Mr. Highfill is not adhering to heart healthy diet but is trying to add vegetables to his diet; Mr. Strahm has held to 5 cigarettes per day (from 1.5PPD); he says he is trying to only smoke if he is "really nervous" and "after meals" and "one when I wake up"  Home Safety/Fall Risk - a volunteer came to Mr. Minnie's home last month and made an assessment of his deck/entryway and determined that he would be eligible for installment of  22 ft sloped ramp and front porch at no cost to Mr. Toor; Mr. Gustafson is very excited about this because it means  he will be able to use his electric wheelchair; he anticipates start of construction mid July and is to call us if he has not heard from the team by promised start date (if he cannot reach them himself)  Mr. Busic told me last month he'd had 2 falls:   1 - upon rising quickly when getting up off the sofa; I reviewed with him the importance of sitting, slowly rising, and taking off slowly when getting up from bed or a seated position 2 - while cooking; he stumbled and fell, hitting his back on the kitchen table; he denies any injuries  However, today he said he has had "about 20" falls over this past month and says "that's about average". I discussed with him at length the opportunities for therapy (particularly vestibular therapy) to help with fall risk management. He declined to let me request a referral for vestibular therapy today but said he would discuss it with his wife and let me know next month if he'd like to pursue this.  Plan:   I will continue to see Mr. Regas at home at least monthly while we work on home safety/fall risk management and close supervision of medication adherence.   Mr. Umanzor will continue taking medications as prescribed and will call if he has concerns or if new needs  arise. Mr. Kenneth will continue working on smoking cessation. He says he is not quite ready to quit but wants to try hard to stay at 5 cigarettes a day for now.  Mr. Obryant will practice all discussed fall risk management measures and will report any injuries sustained from a fall or if he experiences any episode of loss of consciousness, he will report to the ED.  [facility] CM Care Plan Problem One        Patient Outreach from 12/30/2014 in [place] Problem One  Home Safety/Fall [place] for Problem One  Active   THN Long Term Goal (31-90 days)  patient will receive appropriate resources for management of barriers creating fall risk over the next 60 days   THN Long Term Goal Start Date  12/30/14   Interventions for Problem One Long Term Goal  reviewed timeline for completion of deck/ramp,  discussed therapy  options available    THN CM Short Term Goal #1 (0-30 days)  over the next 30 days patient will report start of construction on home ramp/deck by [employer] volunteers and will call [place] on the [place] [facility] team if he has difficulty making contact with [employer] ramp team   [facility] CM Short Term Goal #1 Start Date  12/30/14   Interventions for Short Term Goal #1  utilizing teachback method, advised Mr. Solberg to call [facility] team member if he has not heard from [employer] volunteer team by Friday 7/15   THN CM Short Term Goal #2 (0-30 days)  over the next 30 days patient will consider and discuss with his wife possibility of starting vestibular therapy for treatment of dizziness and balance/gait disturbance   THN CM Short Term Goal #2 Start Date  12/30/14   Interventions for Short Term Goal #2  utilizing teachback method, reviewed with patient purpose of physical and vestibular therapy for  treatment of balance/gait disturbance        Janalyn Shy [facility] Care Management  [phone]

## 2015-01-15 NOTE — Addendum Note (Signed)
Addended by: Clerance Lav on: 01/15/2015 06:34 PM   Modules accepted: Miquel Dunn

## 2015-01-26 ENCOUNTER — Other Ambulatory Visit: Payer: Self-pay | Admitting: *Deleted

## 2015-01-26 NOTE — Patient Outreach (Signed)
[place] [facility]) Care Management  01/26/2015  [place] Valdez [birth year] [id number]   Message received from [name] regarding questions about ramp construction and coordination with [employer] about paperwork. I forwarded this message/request to [employer] LCSW as he has been working closely with [name] in the community for resource management.    [facility]  [phone]

## 2015-01-27 ENCOUNTER — Other Ambulatory Visit: Payer: Self-pay | Admitting: *Deleted

## 2015-01-27 NOTE — Patient Outreach (Signed)
[facility]) Care Management   01/27/2015  Jmichael Gille Rochin [birth year] [id number]  Amarie Tarte Trigg is an 59 y.o. male  Subjective: Mr. Strohmeier was referred to [place] Management from his provider's office after a hospitalization for encephalopathy. Mr. Crichlow is being followed by our [place] for community resources and psychosocial assessment and counseling. Mr. Hobbs was being followed by our pharmacy team and our pharmacy manager, Deanne Coffer PharmD for medication management. He has made such drastic improvements in medication adherence that he has met all goals in this area.   I continue to work with Mr. Sagar and am focusing on medication management/adherence, HTN disease management, home safety/fall risk, and community resources. However, Mr. Nick is meeting goals and agrees today that we will have one more face to face visit then I will transition him to telephonic management. He feels he has grown in his ability to independently/self health manage.   Objective:   Review of Systems  Constitutional: Negative.   HENT: Negative.   Eyes: Negative.   Respiratory: Negative.   Cardiovascular: Negative.   Gastrointestinal: Negative.   Genitourinary: Negative.   Musculoskeletal: Positive for falls.       See falls assessment  Skin: Negative.   Neurological: Positive for dizziness. Negative for loss of consciousness.  Endo/Heme/Allergies: Negative.   Psychiatric/Behavioral: Negative.     Physical Exam  Constitutional: He is oriented to person, place, and time. Vital signs are normal. He appears well-developed and well-nourished. He is active.  Cardiovascular: Normal rate, regular rhythm and normal heart sounds.   Respiratory: Effort normal.  GI: Soft.  Neurological: He is alert and oriented to person, place, and time.  Skin: Skin is warm and dry.  Psychiatric: He has a normal mood and affect. His behavior is normal. Judgment and  thought content normal.    Current Medications:   Current Outpatient Prescriptions  Medication Sig Dispense Refill  . albuterol (PROVENTIL HFA;VENTOLIN HFA) 108 (90 BASE) MCG/ACT inhaler Inhale 2 puffs into the lungs every 6 (six) hours as needed for wheezing or shortness of breath.    . ALPRAZolam (XANAX) 1 MG tablet Take 1 mg by mouth 2 (two) times daily as needed for anxiety.    Marland Kitchen aspirin EC 81 MG tablet Take 81 mg by mouth daily.    . baclofen (LIORESAL) 20 MG tablet Take 20 mg by mouth 2 (two) times daily as needed for muscle spasms.    . clopidogrel (PLAVIX) 75 MG tablet Take 75 mg by mouth daily.    . Cyanocobalamin (B-12) 1000 MCG/ML KIT Inject 1,000 mcg as directed every 30 (thirty) days.    . DULoxetine (CYMBALTA) 60 MG capsule Take 60 mg by mouth daily.    . enalapril (VASOTEC) 10 MG tablet Take 10 mg by mouth daily.    . folic acid (FOLVITE) 1 MG tablet Take 1 tablet (1 mg total) by mouth daily. (Patient not taking: Reported on 11/04/2014)    . gabapentin (NEURONTIN) 800 MG tablet Take 800 mg by mouth 3 (three) times daily.    Marland Kitchen HYDROcodone-acetaminophen (NORCO/VICODIN) 5-325 MG per tablet Take 1 tablet by mouth 3 (three) times daily as needed for moderate pain.    . isosorbide mononitrate (ISMO,MONOKET) 20 MG tablet Take 10 mg by mouth daily.    . metoprolol succinate (TOPROL-XL) 50 MG 24 hr tablet Take 50 mg by mouth 2 (two) times daily. Take with or immediately following a meal.    . Multiple Vitamin (  MULTIVITAMIN WITH MINERALS) TABS tablet Take 1 tablet by mouth daily.    . rosuvastatin (CRESTOR) 20 MG tablet Take 20 mg by mouth daily.    . rosuvastatin (CRESTOR) 40 MG tablet Take 40 mg by mouth daily.    . tamsulosin (FLOMAX) 0.4 MG CAPS capsule Take 0.4 mg by mouth daily.    Marland Kitchen thiamine 100 MG tablet Take 1 tablet (100 mg total) by mouth daily. (Patient not taking: Reported on 10/10/2014)    . tiotropium (SPIRIVA) 18 MCG inhalation capsule Place 18 mcg into inhaler and inhale  daily as needed (for shortness of breath).     . traMADol (ULTRAM) 50 MG tablet Take 50 mg by mouth 4 (four) times daily as needed for moderate pain (patient states started on 10/15/14 by primary care for knee pain; reports taking approximately once daily).    . vitamin B-12 (CYANOCOBALAMIN) 1000 MCG tablet Take 1,000 mcg by mouth daily.     No current facility-administered medications for this visit.    Functional Status:   In your present state of health, do you have any difficulty performing the following activities: 12/30/2014 12/30/2014  Hearing? N N  Vision? N N  Difficulty concentrating or making decisions? N N  Walking or climbing stairs? Y Y  Dressing or bathing? N N  Doing errands, shopping? Tempie Donning  Preparing Food and eating ? Y Y  Using the Toilet? N N  In the past six months, have you accidently leaked urine? N N  Do you have problems with loss of bowel control? N N  Managing your Medications? N Y  Managing your Finances? Tempie Donning  Housekeeping or managing your Housekeeping? Tempie Donning    Fall/Depression Screening:    PHQ 2/9 Scores 11/04/2014 10/02/2014  PHQ - 2 Score 0 0     Medication Review - Mr. Ohanian is doing his own medication/pill counts on Alprazolam, Hydrocodone, Tramadol, Baclofen. He verbalizes understanding that these medications in particular are important to maintain exceptional control of if he wishes to minimize fall risk and neuro stability.  Alprazolam -Filled #60 01/26/15; 59 on hand; taking BID most days - one around lunch and one at bedtime; states he only takes 1/2 if he isn't feeling stressed out during the day   Hydrocodone/APAP - Filled #90 on 01/26/15 - 91 on hand (put 1 from previous bottle in new bottle); patient is self counting to keep up with administration; told me he knew he had 91 pills this morning. Takes 3 times daily with food to avoid upset stomach  Tramadol - Filled # 120 01/20/15; 102 on hand; taking 2-3 times daily as needed  Baclofen - #60 filled  on 01/07/15; 23 on hand; taking twice daily  Spiriva - currently out of this medication; wife gets paid this wife and he is to have filled  Chronic Health Condition (HTN) - Mr. Walth is now checking his own blood pressures DAILY and recording along with self administration of blood pressure medications (see documents in Environmental health practitioner); see medication list/review for medication adherence (drastically improved); Mr. 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 Zweber has held to 5 cigarettes per day (from 1.5PPD) and 4 cigarettes some days; he says he is trying to only smoke if he  is "really nervous" and "after meals" and "one when I wake up"  Home Safety/Fall Risk - Mr. [name] LCSW confirmed with [employer] yesterday that they have all needed paperwork for ramp construction on the front of the home; the landlord also called [employer] to give verbal permission for construction. Mr. Higham says [employer] called him to notify him that the construction permit had been requested. Mr. Burdo is very excited about this because it means he will be able to use his electric wheelchair  Mr. Guertin says he has employed the techniques I recommended (rising slowly, taking off slowly) and has only had 2 falls in the past month.  Plan:   I will continue to see Mr. Schou at home at least monthly while we work on home safety/fall risk management and close supervision of medication adherence.   Mr. Berisha will continue taking medications as prescribed and will call if he has concerns or if new needs arise.  Mr. Marxen will continue working on smoking cessation. He says he is not quite ready to quit but wants to try hard to stay at 4-5 cigarettes a day for now.   Mr. Weckwerth will  practice all discussed fall risk management measures and will report any injuries sustained from a fall or if he experiences any episode of loss of consciousness, he will report to the ED.   [facility] CM Care Plan Problem One        Patient Outreach from 01/27/2015 in [place] Problem One  Home Safety/Fall [place] for Problem One  Active   THN Long Term Goal (31-90 days)  patient will receive appropriate resources for management of barriers creating fall risk over the next 60 days   THN Long Term Goal Start Date  12/30/14   Interventions for Problem One Long Term Goal  reviewed timeline for completion of deck/ramp with patient and LCSW   THN CM Short Term Goal #1 (0-30 days)  over the next 30 days patient will report start of construction on home ramp/deck by [employer] volunteers and will call [place] Management team if he has difficulty making contact with [employer] ramp team   [facility] CM Short Term Goal #1 Start Date  12/30/14 [not met last month,  reset goal for this month]   Interventions for Short Term Goal #1  utilizing teachback method, advised Mr. Ladouceur to call [facility] team member if he has not heard from [place] team in the next 14 days                   [place] Management  [phone]

## 2015-01-29 ENCOUNTER — Other Ambulatory Visit: Payer: Self-pay | Admitting: Licensed Clinical Social Worker

## 2015-01-29 NOTE — Patient Outreach (Signed)
Assessment: CSW received incoming call on 01/29/15 from client. CSW verified identity of client.  CSW and client spoke of current needs of client. Client said he had fallen in recent weeks but had no serious injury from falls. He said he was excited about upcoming ramp construction at his home.  CSW informed client on 01/29/15 that [place] had spoken recently via phone with Waymond Cera of Aging, Disability and Transient Services.  Juliann Pulse had informed CSW recently that [employer] had the needed paperwork from client and landlord.  Juliann Pulse said she would be going this week to seek work permit for the Therapist, music for client.  CSW informed client of above information.  CSW also encouraged client to check batteries on his electric wheelchair. Since it had been sitting for about 2 years, battery power could be low on wheelchair.  Client said he would check batteries and battery power on his electric wheelchair. Client said he was communicating as needed with RN Janalyn Shy related to nursing needs of client.  CSW and client spoke of socialization for client. CSW spoke with client about the fact that when his ramp is constructed he will be able to use his electric wheelchair to go in and out of his home and hopefully will be able to use electric wheelchair to go and visit with his friends nearby.  Client is excited about ramp construction.  CSW congratulated client on his patience in waiting for ramp to be completed.  Plan: Client to take medications as prescribed and attend scheduled medical appointments. CSW to call client in two weeks to assess needs of client at that time. Client to communicate, as needed, with RN Janalyn Shy to discuss nursing needs of client.  Norva Riffle.Andreas Sobolewski MSW, LCSW Licensed Clinical Social Worker [facility] Care Management [phone]

## 2015-02-03 ENCOUNTER — Other Ambulatory Visit: Payer: Self-pay | Admitting: Licensed Clinical Social Worker

## 2015-02-03 NOTE — Patient Outreach (Signed)
Assessment: CSW called client on 02/03/15. CSW verified identity of client. CSW and client spoke of fact that Waymond Cera of Aging, Disability and Transient Services had obtained work permit for Insurance account manager project at home of client.  George Valdez said that volunteer group had not contacted him yet regarding start of ramp construction project. Client said he was going to an appointment with his eye doctor today. He said he had his prescribed medications. He has support from his wife.  He is excited about beginning of ramp construction at his home. CSW congratulated client on his patience in waiting for project to begin and encouraged client to call CSW as needed to discuss social work needs of client.  Plan: CSW called Waymond Cera of [employer] on 02/03/15 and informed her that volunteer group had not contacted client as yet to discuss beginning of ramp construction at home of client. Client to communicate with RN Janalyn Shy to discuss nursing needs of client. CSW to call client in two weeks to assess needs of client at that time.  Norva Riffle.Ailynn Gow MSW, LCSW Licensed Clinical Social Worker [facility] Care Management [phone]

## 2015-02-16 ENCOUNTER — Other Ambulatory Visit: Payer: Self-pay | Admitting: Licensed Clinical Social Worker

## 2015-02-16 NOTE — Patient Outreach (Signed)
Assessment:  CSW received call from client on 02/16/15. CSW verified identity of client.  Client informed CSW on 02/16/15 that no volunteers had called him recently or had come to his home to begin ramp construction project for client.  CSW had been informed by Aging, [place] and Transient Services representative recently that all documentation was completed and volunteers were ready to start project with client soon. This information was received by CSW several weeks ago. No volunteers have been to home of client to begin project for client. CSW called Aging, Disability and Transient Services on 02/16/15.  CSW spoke with Waymond Cera at Aging, Disability, and Transient Services on 02/16/15 about status of ramp construction project for client. CSW informed Juliann Pulse that no volunteers had arrived yet at home of client to begin project.  CSW gave Juliann Pulse the home phone number of client [phone]) on 02/16/15 and asked Juliann Pulse to please forward this number to volunteer group leader so leader could contact client to set up home appointment of volunteer group with client to begin project.  Juliann Pulse said she would give this client phone number to volunteer group leader via phone message to volunteer group leader today.  CSW thanked [place] for her assistance.  CSW then returned call to client on 02/16/15 and informed client of above information.  Of note, client changed some of his phone numbers recently and it is possible that group leader had an old phone number for client that would now not work.  CSW informed client on 02/16/15 that Waymond Cera would give client phone number to volunteer group leader via phone message on 02/16/15.  CSW asked client to call CSW back next week if volunteer group leader has not contacted client via phone by that time.  Plan: Client to take medications as prescribed and to attend scheduled medical appointments. Client to communicate with RN Janalyn Shy as needed to discuss nursing needs  of client. CSW to call client in two weeks to assess status of ramp construction project with client.  Norva Riffle.Kyndahl Jablon MSW, LCSW Licensed Clinical Social Worker [facility] Care Management [phone]

## 2015-02-25 ENCOUNTER — Other Ambulatory Visit: Payer: Self-pay | Admitting: Licensed Clinical Social Worker

## 2015-02-25 NOTE — Patient Outreach (Signed)
Assessment: CSW received phone call from client on 02/25/15.  CSW verified identity of client.  CSW and client spoke of ramp construction project status for client.  Client said that two men had come to his home yesterday and taken measurements needed for ramp construction project. Client said that these two men were then going to pick up lumber needed to use on project for client.  CSW called Waymond Cera at Aging, Disability and Transient Services on 02/25/15. Juliann Pulse said that Health visitor for project had become ill and was hospitalized. She said that agency was now working with contractor to complete ramp Therapist, music for client since Health visitor was ill and hospitalized.  CSW thanked Juliann Pulse for agency assistance in meeting needs of client. CSW informed Juliann Pulse that deck of client at his home was beginning to have broken boards and that client continued to have falls in his home periodically.  CSW informed Juliann Pulse that client was making sure his motorized wheel chair was ready to use and that client was excited about ramp construction project. Juliann Pulse informed CSW that contractor was scheduled to work on project at home of client today.  CSW thanked [place] for information. CSW called client back on 02/25/15 and informed client of above information.  Client said he would remain at home on 02/25/15 to wait for contractor to arrive to work on project for client.  CSW encouraged client to call CSW as needed to discuss current needs of client. CSW encouraged client to call CSW to update CSW on status of ramp construction project for client.  Plan: Client to cooperate with contractor working on Administrator at home of client. Client to take medications as prescribed and attend scheduled medical appointments CSW to call client next week to discuss status of ramp construction project for client.  Norva Riffle.Jadin Kagel MSW, LCSW Licensed Clinical Social Worker [facility] Care Management [phone]

## 2015-02-27 ENCOUNTER — Other Ambulatory Visit: Payer: Self-pay | Admitting: *Deleted

## 2015-02-27 NOTE — Patient Outreach (Signed)
[facility]) Care Management  02/27/2015  George Valdez [birth year] [id number]   George Valdez is doing well at home and has made drastic improvements in the area of medication adherence in particular. He worked closely with our Chartered certified accountant, Dr. Deanne Coffer as well as with nursing and social work services. He has had no further ED visits or inpatient hospitalizations since [place] Management began working together with George Valdez towards his stated goals.   Pharmacy and Nursing goals have been met. George Valdez is still working with George Valdez towards construction of a new ramp/deck on the front entrance of his new home. George Valdez continues to collaborate with George Valdez and [employer] [facility] on this barrier.   I will discharge George Valdez from nursing case management services at this time but he will continue to receive assistance through our social work services.   I am happy to provide assistance to George Valdez at any time in the future should he be in need of nursing services.    [place] Management  [phone]

## 2015-03-03 ENCOUNTER — Encounter: Payer: Self-pay | Admitting: Licensed Clinical Social Worker

## 2015-03-03 ENCOUNTER — Other Ambulatory Visit: Payer: Self-pay | Admitting: Licensed Clinical Social Worker

## 2015-03-03 NOTE — Patient Outreach (Signed)
[facility]) Care Management  [facility] Social Work  03/03/2015  George Valdez 04-15-1956 [id number]  Subjective:    Objective:   Current Medications:  Current Outpatient Prescriptions  Medication Sig Dispense Refill  . albuterol (PROVENTIL HFA;VENTOLIN HFA) 108 (90 BASE) MCG/ACT inhaler Inhale 2 puffs into the lungs every 6 (six) hours as needed for wheezing or shortness of breath.    . ALPRAZolam (XANAX) 1 MG tablet Take 1 mg by mouth 2 (two) times daily as needed for anxiety.    Marland Kitchen aspirin EC 81 MG tablet Take 81 mg by mouth daily.    . baclofen (LIORESAL) 20 MG tablet Take 20 mg by mouth 2 (two) times daily as needed for muscle spasms.    . clopidogrel (PLAVIX) 75 MG tablet Take 75 mg by mouth daily.    . Cyanocobalamin (B-12) 1000 MCG/ML KIT Inject 1,000 mcg as directed every 30 (thirty) days.    . DULoxetine (CYMBALTA) 60 MG capsule Take 60 mg by mouth daily.    . enalapril (VASOTEC) 10 MG tablet Take 10 mg by mouth daily.    . folic acid (FOLVITE) 1 MG tablet Take 1 tablet (1 mg total) by mouth daily. (Patient not taking: Reported on 11/04/2014)    . gabapentin (NEURONTIN) 800 MG tablet Take 800 mg by mouth 3 (three) times daily.    Marland Kitchen HYDROcodone-acetaminophen (NORCO/VICODIN) 5-325 MG per tablet Take 1 tablet by mouth 3 (three) times daily as needed for moderate pain.    . isosorbide mononitrate (ISMO,MONOKET) 20 MG tablet Take 10 mg by mouth daily.    . metoprolol succinate (TOPROL-XL) 50 MG 24 hr tablet Take 50 mg by mouth 2 (two) times daily. Take with or immediately following a meal.    . Multiple Vitamin (MULTIVITAMIN WITH MINERALS) TABS tablet Take 1 tablet by mouth daily.    . rosuvastatin (CRESTOR) 20 MG tablet Take 20 mg by mouth daily.    . rosuvastatin (CRESTOR) 40 MG tablet Take 40 mg by mouth daily.    . tamsulosin (FLOMAX) 0.4 MG CAPS capsule Take 0.4 mg by mouth daily.    Marland Kitchen thiamine 100 MG tablet Take 1 tablet (100 mg total) by mouth daily.  (Patient not taking: Reported on 10/10/2014)    . tiotropium (SPIRIVA) 18 MCG inhalation capsule Place 18 mcg into inhaler and inhale daily as needed (for shortness of breath).     . traMADol (ULTRAM) 50 MG tablet Take 50 mg by mouth 4 (four) times daily as needed for moderate pain (patient states started on 10/15/14 by primary care for knee pain; reports taking approximately once daily).    . vitamin B-12 (CYANOCOBALAMIN) 1000 MCG tablet Take 1,000 mcg by mouth daily.     No current facility-administered medications for this visit.    Functional Status:  In your present state of health, do you have any difficulty performing the following activities: 12/30/2014 12/30/2014  Hearing? N N  Vision? N N  Difficulty concentrating or making decisions? N N  Walking or climbing stairs? Y Y  Dressing or bathing? N N  Doing errands, shopping? Tempie Donning  Preparing Food and eating ? Y Y  Using the Toilet? N N  In the past six months, have you accidently leaked urine? N N  Do you have problems with loss of bowel control? N N  Managing your Medications? N Y  Managing your Finances? Tempie Donning  Housekeeping or managing your Housekeeping? Tempie Donning    Fall/Depression Screening:  PHQ 2/9 Scores 01/27/2015 11/04/2014 10/02/2014  PHQ - 2 Score 0 0 0    Assessment:   CSW documented in EPIC the Case Management End Date for client (03/03/15).  CSW documented in EPIC the Program Two (Social Work) end date for client for Program Two (03/03/15).    Plan: Client is now discharged from all [facility] services.    Norva Riffle.Ikaika Showers MSW, LCSW Licensed Clinical Social Worker [facility] Care Management [phone]

## 2015-03-03 NOTE — Patient Outreach (Signed)
Assessment: CSW called home phone number of client on 03/03/15.  CSW verified identity of client on 03/03/15.CSW and client spoke of needs of client.  Client said he had materials on advanced directives and felt that he and his wife would continue to discuss advanced directives completion. He said he did not need any social work assistance with advanced directives at this time.  He said he had his prescribed medications. He is meeting with Dr. Ethlyn Gallery, primary doctor, as scheduled. He said he has appointment with Dr. Ethlyn Gallery in October of 2016. He said he is using his ramp to go in and out of his home via use of he motorized wheelchair. He said he had been in and out of his home several times this past weekend. He said ramp constructed is working very well for him.  CSW informed client that client had met care plan goals for client and had met social work goals for client. CSW informed client that [facility] would discharge client, close case of client to CSW support on 03/03/15 since client had met all of his goals set. Client agreed to this plan. He was very appreciative of CSW support and agreed for CSW to discharge client from [place] services on 03/03/15. CSW reminded client that if client felt he needed CSW support with [facility] again that client could speak with Dr. Ethlyn Gallery in the future to see if Dr. Ethlyn Gallery thought client should be re-referred to [facility] program support. RN Janalyn Shy has also recently discharged client from [place].  Client was appreciative of all the support he had received from [facility] staff.   Plan: CSW is discharging George Valdez on 03/03/15 from [place] services since client has met his care plan goals set. CSW to inform Lurline Del on 03/03/15 that [place] discharged client on 03/03/15. CSW to send physician case closure letter to Dr. Ethlyn Gallery on 03/03/15 informing Dr. Ethlyn Gallery that client's case is now closed to [facility] program support since client met all his goals set.  Norva Riffle.Anitha Kreiser  MSW, LCSW Licensed Clinical Social Worker [facility] Care Management [phone]

## 2015-03-04 NOTE — Patient Outreach (Signed)
[facility]) Care Management  03/04/2015  George Valdez March 13, 1956 [id number]   Notification from [name], LCSW to close case to all disciplines, patient has met goals with Thanks, [name]. [place], [facility] Assistant Phone: [phone] Fax: [phone]

## 2015-03-17 ENCOUNTER — Other Ambulatory Visit: Payer: Self-pay | Admitting: *Deleted

## 2015-03-17 NOTE — Patient Outreach (Signed)
[facility]) Care Management   03/17/2015  [place] [birth year] [id number]  George Valdez is an 59 y.o. male who was referred to [facility] from his provider's office after a hospitalization for encephalopathy. He has made such drastic improvements in medication adherence that he has met all goals in this area. George Valdez has had no further ED visits or hospital admissions and is no longer falling. He was officially discharged from [facility] last month but called asking if I could see him to sort out a few questions/concerns.  Subjective: "I feel good. I just have to get this medicine straightened out. I want to take it right."  Objective:  BP 100/70 mmHg  Pulse 81  SpO2 96%  Review of Systems  Constitutional: Negative.   HENT: Negative.   Eyes: Negative.   Respiratory: Negative.   Cardiovascular: Negative.   Gastrointestinal: Negative.   Genitourinary: Negative.   Musculoskeletal: Positive for myalgias. Negative for falls.       No falls - notable change/improvement; prior to intervention and detailed medication management, patient reported falling daily  Skin: Negative.   Neurological: Negative.   Psychiatric/Behavioral: Negative.     Physical Exam  Constitutional: He is oriented to person, place, and time. Vital signs are normal. He appears well-developed and well-nourished. He is active.  Cardiovascular: Normal rate and regular rhythm.   Respiratory: Effort normal.  Neurological: He is alert and oriented to person, place, and time.  Skin: Skin is warm and dry.  Psychiatric: He has a normal mood and affect. His speech is normal and behavior is normal. Judgment and thought content normal. Cognition and memory are normal.       Assessment:    George Valdez asked that I see him at home today because he had a few more questions/concerns about his medications. We closed his case last month because he met goals and has had no further ED  visits or Inpatient admits.   George Valdez stated he was having difficulty getting authorization or a new prescription from his provider for his new/increased dose of Cymbalta (was on 75m/now on 646m. George Valdez he contacted the office last week about this. I called [facility] and left a voice message for George Valdez's CMA, requesting a return call regarding this medication concern. During our visit, the George Valdez came and George Valdez's Cymbalta 6061mose was delivered.    George Valdez deck and ramp were built and he is very happy with them. Unfortunately, the floor of his mobile home at the entrance to the deck has a large metal strip. George Valdez me he is "getting a running start" with his electric wheelchair to be able to get over the entrance so he can exit the house. I will discuss with George Valdez and see if we can engage a community resource or the patient's landlord for a repair to this area so that he can use his new deck/ramp.   George Valdez that he is not having falls AT ALL since all the changes with medications and assistance with adherence plans. He is taking all medications as prescribed and documenting his blood pressure medications in particular. He is performing his own pill counts periodically so that he can make sure he is staying on track. He also continues to check bp daily and record.   George Valdez received his flu vaccine at the provider office this month.   Plan:  George Valdez will call if he has any needs or concerns in the future. He agrees that all goals have been met.   It has been a pleasure working with George Valdez and seeing his tremendous progress.    [facility]  [phone]

## 2015-07-27 ENCOUNTER — Encounter: Payer: Self-pay | Admitting: *Deleted

## 2015-07-28 ENCOUNTER — Encounter: Payer: Self-pay | Admitting: *Deleted

## 2015-10-08 DIAGNOSIS — F329 Major depressive disorder, single episode, unspecified: Secondary | ICD-10-CM | POA: Diagnosis not present

## 2015-10-08 DIAGNOSIS — I69364 Other paralytic syndrome following cerebral infarction affecting left non-dominant side: Secondary | ICD-10-CM | POA: Diagnosis not present

## 2015-10-08 DIAGNOSIS — I10 Essential (primary) hypertension: Secondary | ICD-10-CM | POA: Diagnosis not present

## 2015-10-08 DIAGNOSIS — Z1389 Encounter for screening for other disorder: Secondary | ICD-10-CM | POA: Diagnosis not present

## 2015-10-08 DIAGNOSIS — Z6831 Body mass index (BMI) 31.0-31.9, adult: Secondary | ICD-10-CM | POA: Diagnosis not present

## 2015-10-08 DIAGNOSIS — E782 Mixed hyperlipidemia: Secondary | ICD-10-CM | POA: Diagnosis not present

## 2015-10-08 DIAGNOSIS — E6609 Other obesity due to excess calories: Secondary | ICD-10-CM | POA: Diagnosis not present

## 2015-10-08 DIAGNOSIS — G894 Chronic pain syndrome: Secondary | ICD-10-CM | POA: Diagnosis not present

## 2015-11-10 IMAGING — DX DG CHEST 1V
1 series · 1 of 1 positions shown · non-contrast
Comparison: 09/05/2014.

CLINICAL DATA: 59-year-old male with altered mental status weakness
and falls. Initial encounter.

EXAM:
CHEST  1 VIEW

[chest ap]
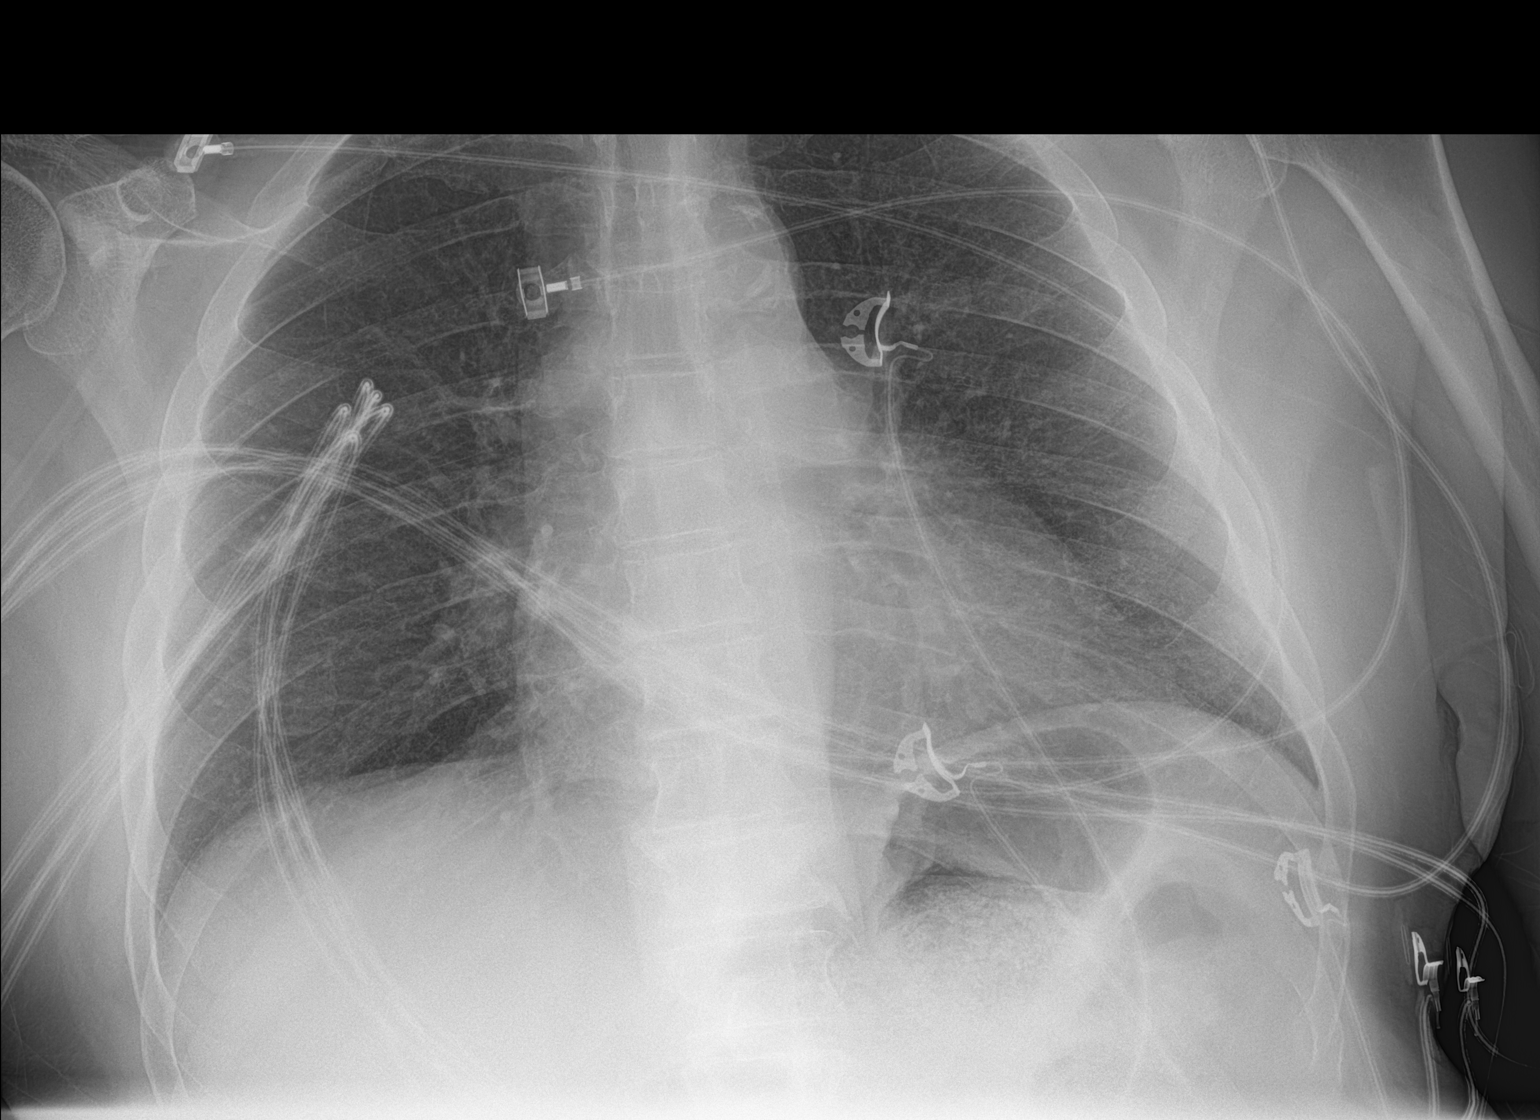

[1 of 1 positions shown; findings below may reference images not displayed]

FINDINGS: Portable AP semi upright view at 8773 hours. Lung volumes are within
normal limits. Allowing for portable technique, the lungs are clear.
No pneumothorax identified. Normal cardiac size and mediastinal
contours. Visualized tracheal air column is within normal limits.
Chronic right clavicle fracture. No acute osseous abnormality
identified.
IMPRESSION: No acute cardiopulmonary abnormality.

## 2015-11-16 ENCOUNTER — Encounter (HOSPITAL_COMMUNITY): Payer: Self-pay | Admitting: Emergency Medicine

## 2015-11-16 ENCOUNTER — Emergency Department (HOSPITAL_COMMUNITY): Payer: Medicare Other

## 2015-11-16 ENCOUNTER — Emergency Department (HOSPITAL_COMMUNITY)
Admission: EM | Admit: 2015-11-16 | Discharge: 2015-11-16 | Disposition: A | Discharge status: Home / Self Care | Payer: Medicare Other | Attending: Emergency Medicine | Admitting: Emergency Medicine

## 2015-11-16 DIAGNOSIS — E860 Dehydration: Secondary | ICD-10-CM | POA: Diagnosis not present

## 2015-11-16 DIAGNOSIS — Z79899 Other long term (current) drug therapy: Secondary | ICD-10-CM | POA: Diagnosis not present

## 2015-11-16 DIAGNOSIS — I9589 Other hypotension: Secondary | ICD-10-CM | POA: Diagnosis not present

## 2015-11-16 DIAGNOSIS — R410 Disorientation, unspecified: Secondary | ICD-10-CM | POA: Insufficient documentation

## 2015-11-16 DIAGNOSIS — I10 Essential (primary) hypertension: Secondary | ICD-10-CM | POA: Diagnosis not present

## 2015-11-16 DIAGNOSIS — F329 Major depressive disorder, single episode, unspecified: Secondary | ICD-10-CM | POA: Diagnosis not present

## 2015-11-16 DIAGNOSIS — Z7982 Long term (current) use of aspirin: Secondary | ICD-10-CM | POA: Diagnosis not present

## 2015-11-16 DIAGNOSIS — E785 Hyperlipidemia, unspecified: Secondary | ICD-10-CM | POA: Diagnosis not present

## 2015-11-16 DIAGNOSIS — I951 Orthostatic hypotension: Secondary | ICD-10-CM | POA: Diagnosis not present

## 2015-11-16 DIAGNOSIS — Z6830 Body mass index (BMI) 30.0-30.9, adult: Secondary | ICD-10-CM | POA: Diagnosis not present

## 2015-11-16 DIAGNOSIS — Z79891 Long term (current) use of opiate analgesic: Secondary | ICD-10-CM | POA: Insufficient documentation

## 2015-11-16 DIAGNOSIS — R531 Weakness: Secondary | ICD-10-CM | POA: Diagnosis present

## 2015-11-16 DIAGNOSIS — Z1389 Encounter for screening for other disorder: Secondary | ICD-10-CM | POA: Diagnosis not present

## 2015-11-16 DIAGNOSIS — F17210 Nicotine dependence, cigarettes, uncomplicated: Secondary | ICD-10-CM | POA: Diagnosis not present

## 2015-11-16 DIAGNOSIS — R930 Abnormal findings on diagnostic imaging of skull and head, not elsewhere classified: Secondary | ICD-10-CM | POA: Diagnosis not present

## 2015-11-16 DIAGNOSIS — Z8673 Personal history of transient ischemic attack (TIA), and cerebral infarction without residual deficits: Secondary | ICD-10-CM | POA: Diagnosis not present

## 2015-11-16 DIAGNOSIS — R112 Nausea with vomiting, unspecified: Secondary | ICD-10-CM

## 2015-11-16 DIAGNOSIS — I2510 Atherosclerotic heart disease of native coronary artery without angina pectoris: Secondary | ICD-10-CM | POA: Insufficient documentation

## 2015-11-16 DIAGNOSIS — K2970 Gastritis, unspecified, without bleeding: Secondary | ICD-10-CM | POA: Diagnosis not present

## 2015-11-16 LAB — URINALYSIS, ROUTINE W REFLEX MICROSCOPIC
Bilirubin Urine: NEGATIVE
Glucose, UA: NEGATIVE mg/dL
KETONES UR: NEGATIVE mg/dL
Leukocytes, UA: NEGATIVE
NITRITE: NEGATIVE
PROTEIN: 30 mg/dL — AB
Specific Gravity, Urine: 1.01 (ref 1.005–1.030)
pH: 6 (ref 5.0–8.0)

## 2015-11-16 LAB — BASIC METABOLIC PANEL
Anion gap: 5 (ref 5–15)
BUN: 31 mg/dL — ABNORMAL HIGH (ref 6–20)
CALCIUM: 9 mg/dL (ref 8.9–10.3)
CO2: 24 mmol/L (ref 22–32)
Chloride: 108 mmol/L (ref 101–111)
Creatinine, Ser: 1.86 mg/dL — ABNORMAL HIGH (ref 0.61–1.24)
GFR, EST AFRICAN AMERICAN: 43 mL/min — AB (ref >60)
GFR, EST NON AFRICAN AMERICAN: 37 mL/min — AB (ref >60)
Glucose, Bld: 119 mg/dL — ABNORMAL HIGH (ref 65–99)
Potassium: 3.9 mmol/L (ref 3.5–5.1)
Sodium: 137 mmol/L (ref 135–145)

## 2015-11-16 LAB — URINE MICROSCOPIC-ADD ON
SQUAMOUS EPITHELIAL / LPF: NONE SEEN
WBC, UA: NONE SEEN WBC/hpf (ref 0–5)

## 2015-11-16 LAB — CBC WITH DIFFERENTIAL/PLATELET
Basophils Absolute: 0 10*3/uL (ref 0.0–0.1)
Basophils Relative: 0 %
Eosinophils Absolute: 0.5 10*3/uL (ref 0.0–0.7)
Eosinophils Relative: 6 %
HCT: 37.8 % — ABNORMAL LOW (ref 39.0–52.0)
Hemoglobin: 12.4 g/dL — ABNORMAL LOW (ref 13.0–17.0)
LYMPHS ABS: 1.3 10*3/uL (ref 0.7–4.0)
Lymphocytes Relative: 16 %
MCH: 30.9 pg (ref 26.0–34.0)
MCHC: 32.8 g/dL (ref 30.0–36.0)
MCV: 94.3 fL (ref 78.0–100.0)
MONOS PCT: 12 %
Monocytes Absolute: 1 10*3/uL (ref 0.1–1.0)
NEUTROS ABS: 5.4 10*3/uL (ref 1.7–7.7)
Neutrophils Relative %: 66 %
Platelets: 211 10*3/uL (ref 150–400)
RBC: 4.01 MIL/uL — AB (ref 4.22–5.81)
RDW: 15.1 % (ref 11.5–15.5)
WBC: 8.3 10*3/uL (ref 4.0–10.5)

## 2015-11-16 LAB — HEPATIC FUNCTION PANEL
ALK PHOS: 59 U/L (ref 38–126)
ALT: 14 U/L — ABNORMAL LOW (ref 17–63)
AST: 18 U/L (ref 15–41)
Albumin: 3.4 g/dL — ABNORMAL LOW (ref 3.5–5.0)
BILIRUBIN INDIRECT: 0.3 mg/dL (ref 0.3–0.9)
Bilirubin, Direct: 0.2 mg/dL (ref 0.1–0.5)
Total Bilirubin: 0.5 mg/dL (ref 0.3–1.2)
Total Protein: 6.6 g/dL (ref 6.5–8.1)

## 2015-11-16 LAB — TROPONIN I

## 2015-11-16 LAB — CBG MONITORING, ED: Glucose-Capillary: 106 mg/dL — ABNORMAL HIGH (ref 65–99)

## 2015-11-16 MED ORDER — SODIUM CHLORIDE 0.9 % IV BOLUS (SEPSIS)
1000.0000 mL | Freq: Once | INTRAVENOUS | Status: AC
Start: 2015-11-16 — End: 2015-11-16
  Administered 2015-11-16: 1000 mL via INTRAVENOUS

## 2015-11-16 MED ORDER — SODIUM CHLORIDE 0.9 % IV SOLN
Freq: Once | INTRAVENOUS | Status: AC
  Administered 2015-11-16: 13:00:00 via INTRAVENOUS

## 2015-11-16 MED ORDER — ONDANSETRON 4 MG PO TBDP: 4.0000 mg | ORAL_TABLET | Freq: Three times a day (TID) | ORAL | Status: DC | PRN

## 2015-11-16 MED ORDER — SODIUM CHLORIDE 0.9 % IV BOLUS (SEPSIS)
1000.0000 mL | Freq: Once | INTRAVENOUS | Status: AC
  Administered 2015-11-16: 1000 mL via INTRAVENOUS

## 2015-11-16 NOTE — ED Notes (Signed)
Pt wife reports pt is increasing weak, unsteady on feet and more confused than usual x 3 days. Pt walks with cane and is very unsteady in triage. Sent by pcp for possible dehydration.

## 2015-11-16 NOTE — ED Provider Notes (Signed)
CSN: [id number]     Arrival date & time 11/16/15  4680 History  By signing my name below, I, Eustaquio Maize, attest that this documentation has been prepared under the direction and in the presence of Tanna Furry, MD. Electronically Signed: Eustaquio Maize, ED Scribe. 11/16/2015. 9:47 AM.   Chief Complaint  Patient presents with  . Weakness   The history is provided by the patient. No language interpreter was used.    HPI Comments: George Valdez is a 60 y.o. male with PMHx CAD, MI, HTN, HLD, and stroke who presents to the Emergency Department complaining of gradual onset, constant, bilateral hand weakness x 4 days. He reports that he continued to drop objects that he was attempting to hold. Pt is able to ambulate but notes a "wobbly" unsteady gait. Pt states he has constant BLE weakness but states it is unchanged.Pt mentions having emesis 4 days ago that has since resolved on its own. He has not vomited today or yesterday. Pt also complains of generalized weakness and sinus pressure. He had a stroke 1 year ago and 3 years ago. Pt mentions that with previous strokes he also vomited. These symptoms feel similar to previous stroke. He has hx of HTN but states that he has been complaint with medication. Pt is hypotensive in ED with BP of 80/53. Denies pain, fever, chills, diarrhea, shortness of breath, or any other associated symptoms.  No hx CHF.   Past Medical History  Diagnosis Date  . Coronary artery disease   . Hypertension   . Hyperlipemia   . Anxiety   . Depression   . Chronic pain   . Neuropathy ([place])   . Pernicious anemia   . Stroke ([place])   . BPH (benign prostatic hyperplasia)    Past Surgical History  Procedure Laterality Date  . Carotid endarterectomy    . Cardiac catheterization     No family history on file. Social History  Substance Use Topics  . Smoking status: Current Every Day Smoker    Types: Cigarettes  . Smokeless tobacco: None     Comment: down to 5  cigarettes/day from 1.5 PPD  . Alcohol Use: 0.0 oz/week    0 Standard drinks or equivalent per week    Review of Systems  Constitutional: Positive for fatigue. Negative for fever, chills, diaphoresis and appetite change.  HENT: Positive for sinus pressure. Negative for mouth sores, sore throat and trouble swallowing.   Eyes: Negative for visual disturbance.  Respiratory: Negative for cough, chest tightness, shortness of breath and wheezing.   Cardiovascular: Negative for chest pain.  Gastrointestinal: Positive for vomiting. Negative for nausea, abdominal pain, diarrhea and abdominal distention.  Endocrine: Negative for polydipsia, polyphagia and polyuria.  Genitourinary: Negative for dysuria, frequency and hematuria.  Musculoskeletal: Negative for myalgias, arthralgias and gait problem.  Skin: Negative for color change, pallor and rash.  Neurological: Positive for weakness (Bilateral hands). Negative for dizziness, syncope, light-headedness and headaches.  Hematological: Does not bruise/bleed easily.  Psychiatric/Behavioral: Negative for behavioral problems and confusion.   Allergies  Codeine  Home Medications   Prior to Admission medications   Medication Sig Start Date End Date Taking? Authorizing Provider  albuterol (PROVENTIL HFA;VENTOLIN HFA) 108 (90 BASE) MCG/ACT inhaler Inhale 2 puffs into the lungs every 6 (six) hours as needed for wheezing or shortness of breath.   Yes Historical Provider, MD  ALPRAZolam Duanne Moron) 1 MG tablet Take 1 mg by mouth 2 (two) times daily as needed for anxiety.  Yes Historical Provider, MD  Cyanocobalamin (B-12) 1000 MCG/ML KIT Inject 1,000 mcg as directed every 30 (thirty) days.   Yes Historical Provider, MD  HYDROcodone-acetaminophen (NORCO/VICODIN) 5-325 MG per tablet Take 1 tablet by mouth 3 (three) times daily as needed for moderate pain.   Yes Historical Provider, MD  Multiple Vitamin (MULTIVITAMIN WITH MINERALS) TABS tablet Take 1 tablet by mouth  daily. 09/12/14  Yes Samuella Cota, MD  vitamin B-12 (CYANOCOBALAMIN) 1000 MCG tablet Take 1,000 mcg by mouth daily.   Yes Historical Provider, MD  aspirin EC 81 MG tablet Take 81 mg by mouth daily. Reported on 11/16/2015    Historical Provider, MD  baclofen (LIORESAL) 20 MG tablet Take 20 mg by mouth 2 (two) times daily as needed for muscle spasms.    Historical Provider, MD  clopidogrel (PLAVIX) 75 MG tablet Take 75 mg by mouth daily.    Historical Provider, MD  DULoxetine (CYMBALTA) 60 MG capsule Take 60 mg by mouth daily.    Historical Provider, MD  enalapril (VASOTEC) 10 MG tablet Take 10 mg by mouth daily.    Historical Provider, MD  folic acid (FOLVITE) 1 MG tablet Take 1 tablet (1 mg total) by mouth daily. 09/12/14   Samuella Cota, MD  gabapentin (NEURONTIN) 800 MG tablet Take 800 mg by mouth 3 (three) times daily.    Historical Provider, MD  isosorbide mononitrate (ISMO,MONOKET) 20 MG tablet Take 10 mg by mouth daily.    Historical Provider, MD  metoprolol succinate (TOPROL-XL) 50 MG 24 hr tablet Take 50 mg by mouth 2 (two) times daily. Take with or immediately following a meal.    Historical Provider, MD  ondansetron (ZOFRAN ODT) 4 MG disintegrating tablet Take 1 tablet (4 mg total) by mouth every 8 (eight) hours as needed for nausea. 11/16/15   Tanna Furry, MD  rosuvastatin (CRESTOR) 20 MG tablet Take 20 mg by mouth daily.    Historical Provider, MD  rosuvastatin (CRESTOR) 40 MG tablet Take 40 mg by mouth daily.    Historical Provider, MD  tamsulosin (FLOMAX) 0.4 MG CAPS capsule Take 0.4 mg by mouth daily.    Historical Provider, MD  thiamine 100 MG tablet Take 1 tablet (100 mg total) by mouth daily. 09/12/14   Samuella Cota, MD  tiotropium (SPIRIVA) 18 MCG inhalation capsule Place 18 mcg into inhaler and inhale daily as needed (for shortness of breath).     Historical Provider, MD  traMADol (ULTRAM) 50 MG tablet Take 50 mg by mouth 4 (four) times daily as needed for moderate pain  (patient states started on 10/15/14 by primary care for knee pain; reports taking approximately once daily). 10/15/14   Caren Macadam, MD   BP 117/74 mmHg  Pulse 50  Temp(Src) 98.5 F (36.9 C) (Rectal)  Resp 14  Ht '5\' 3"'  (1.6 m)  Wt 198 lb (89.812 kg)  BMI 35.08 kg/m2  SpO2 97%   Physical Exam  Constitutional: He is oriented to person, place, and time. He appears well-developed and well-nourished. No distress.  98.2 oral fever  HENT:  Head: Normocephalic.  Mouth/Throat: Mucous membranes are normal.  Eyes: Conjunctivae are normal. Pupils are equal, round, and reactive to light. No scleral icterus.  Neck: Normal range of motion. Neck supple. No thyromegaly present.  Cardiovascular: Normal rate and regular rhythm.  Exam reveals no gallop and no friction rub.   No murmur heard. Pulmonary/Chest: Effort normal and breath sounds normal. No respiratory distress. He has no  wheezes. He has no rales.  Abdominal: Soft. Bowel sounds are normal. He exhibits no distension. There is no tenderness. There is no rebound.  Musculoskeletal: Normal range of motion.  Neurological: He is alert and oriented to person, place, and time.  No facial droop.  Speech slow and methodical but lucid.  Mild global weakness. Moves all 4 extremities.   Skin: Skin is warm and dry. No rash noted. No pallor.  Psychiatric: He has a normal mood and affect. His behavior is normal.    ED Course  Procedures (including critical care time)  DIAGNOSTIC STUDIES: Oxygen Saturation is 99% on RA, normal by my interpretation.    COORDINATION OF CARE: -Discussed treatment plan which includes CT Head with pt at bedside and pt agreed to plan.   Labs Review Labs Reviewed  CBC WITH DIFFERENTIAL/PLATELET - Abnormal; Notable for the following:    RBC 4.01 (*)    Hemoglobin 12.4 (*)    HCT 37.8 (*)    All other components within normal limits  BASIC METABOLIC PANEL - Abnormal; Notable for the following:    Glucose, Bld 119  (*)    BUN 31 (*)    Creatinine, Ser 1.86 (*)    GFR calc non Af Amer 37 (*)    GFR calc Af Amer 43 (*)    All other components within normal limits  URINALYSIS, ROUTINE W REFLEX MICROSCOPIC (NOT AT [facility]) - Abnormal; Notable for the following:    Hgb urine dipstick MODERATE (*)    Protein, ur 30 (*)    All other components within normal limits  HEPATIC FUNCTION PANEL - Abnormal; Notable for the following:    Albumin 3.4 (*)    ALT 14 (*)    All other components within normal limits  URINE MICROSCOPIC-ADD ON - Abnormal; Notable for the following:    Bacteria, UA FEW (*)    All other components within normal limits  CBG MONITORING, ED - Abnormal; Notable for the following:    Glucose-Capillary 106 (*)    All other components within normal limits  TROPONIN I    Imaging Review Ct Head Wo Contrast  11/16/2015  CLINICAL DATA:  Weakness. EXAM: CT HEAD WITHOUT CONTRAST TECHNIQUE: Contiguous axial images were obtained from the base of the skull through the vertex without intravenous contrast. COMPARISON:  09/10/2014 FINDINGS: Mild atrophy unchanged. Chronic left PICA infarct. Chronic ischemia left basal ganglia unchanged. Negative for acute infarct. Negative for intracranial hemorrhage. Negative for mass or edema. No shift of the midline structures. Negative calvarium. IMPRESSION: Atrophy and chronic ischemic changes are stable. No acute abnormality. No change from the prior CT. Electronically Signed   By: Franchot Gallo M.D.   On: 11/16/2015 12:57   I have personally reviewed and evaluated these images and lab results as part of my medical decision-making.   EKG Interpretation None      MDM   Final diagnoses:  Dehydration  Hypotension, postural  Non-intractable vomiting with nausea, vomiting of unspecified type    Patient at his baseline per mother.  After 2 L IV fluids his pressure to 130s. He is ambulatory. He is taking by mouth. He has urinated. Rest come to push his fluids at  home and stay hydrated. He will hold his antihypertensives today and tomorrow morning. If after up and around tomorrow if his blood pressures are normal and he feels well he can resume his medications. He shows no clinical indicators that this is CVA. He is in laboratory.  He is nonfocal. 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     Tanna Furry, MD 11/16/15 [phone]

## 2015-11-16 NOTE — Discharge Instructions (Signed)
Push fluids, stay hydrated. Zofran as needed for any additional nausea vomitin Do not take your Vasotec, Toprol, Isordil, or Flomax today. Take your blood pressure in the morning.  If your blood pressure is low follow-up with your primary physician or return here area if you feel well and your blood pressure is normal you may resume your medicines as previously prescribed   Dehydration, Adult Dehydration is a condition in which you do not have enough fluid or water in your body. It happens when you take in less fluid than you lose. Vital organs such as the kidneys, brain, and heart cannot function without a proper amount of fluids. Any loss of fluids from the body can cause dehydration.  Dehydration can range from mild to severe. This condition should be treated right away to help prevent it from becoming severe. CAUSES  This condition may be caused by:  Vomiting.  Diarrhea.  Excessive sweating, such as when exercising in hot or humid weather.  Not drinking enough fluid during strenuous exercise or during an illness.  Excessive urine output.  Fever.  Certain medicines. RISK FACTORS This condition is more likely to develop in:  People who are taking certain medicines that cause the body to lose excess fluid (diuretics).   People who have a chronic illness, such as diabetes, that may increase urination.  Older adults.   People who live at high altitudes.   People who participate in endurance sports.  SYMPTOMS  Mild Dehydration  Thirst.  Dry lips.  Slightly dry mouth.  Dry, warm skin. Moderate Dehydration  Very dry mouth.   Muscle cramps.   Dark urine and decreased urine production.   Decreased tear production.   Headache.   Light-headedness, especially when you stand up from a sitting position.  Severe Dehydration  Changes in skin.   Cold and clammy skin.   Skin does not spring back quickly when lightly pinched and released.   Changes in  body fluids.   Extreme thirst.   No tears.   Not able to sweat when body temperature is high, such as in hot weather.   Minimal urine production.   Changes in vital signs.   Rapid, weak pulse (more than 100 beats per minute when you are sitting still).   Rapid breathing.   Low blood pressure.   Other changes.   Sunken eyes.   Cold hands and feet.   Confusion.  Lethargy and difficulty being awakened.  Fainting (syncope).   Short-term weight loss.   Unconsciousness. DIAGNOSIS  This condition may be diagnosed based on your symptoms. You may also have tests to determine how severe your dehydration is. These tests may include:   Urine tests.   Blood tests.  TREATMENT  Treatment for this condition depends on the severity. Mild or moderate dehydration can often be treated at home. Treatment should be started right away. Do not wait until dehydration becomes severe. Severe dehydration needs to be treated at the hospital. Treatment for Mild Dehydration  Drinking plenty of water to replace the fluid you have lost.   Replacing minerals in your blood (electrolytes) that you may have lost.  Treatment for Moderate Dehydration  Consuming oral rehydration solution (ORS). Treatment for Severe Dehydration  Receiving fluid through an IV tube.   Receiving electrolyte solution through a feeding tube that is passed through your nose and into your stomach (nasogastric tube or NG tube).  Correcting any abnormalities in electrolytes. HOME CARE INSTRUCTIONS   Drink enough fluid to  keep your urine clear or pale yellow.   Drink water or fluid slowly by taking small sips. You can also try sucking on ice cubes.  Have food or beverages that contain electrolytes. Examples include bananas and sports drinks.  Take over-the-counter and prescription medicines only as told by your health care provider.   Prepare ORS according to the manufacturer's instructions.  Take sips of ORS every 5 minutes until your urine returns to normal.  If you have vomiting or diarrhea, continue to try to drink water, ORS, or both.   If you have diarrhea, avoid:   Beverages that contain caffeine.   Fruit juice.   Milk.   Carbonated soft drinks.  Do not take salt tablets. This can lead to the condition of having too much sodium in your body (hypernatremia).  SEEK MEDICAL CARE IF:  You cannot eat or drink without vomiting.  You have had moderate diarrhea during a period of more than 24 hours.  You have a fever. SEEK IMMEDIATE MEDICAL CARE IF:   You have extreme thirst.  You have severe diarrhea.  You have not urinated in 6-8 hours, or you have urinated only a small amount of very dark urine.  You have shriveled skin.  You are dizzy, confused, or both.   This information is not intended to replace advice given to you by your health care provider. Make sure you discuss any questions you have with your health care provider.   Document Released: 06/13/2005 Document Revised: 03/04/2015 Document Reviewed: 10/29/2014 Elsevier Interactive Patient Education 2016 Else[employer] Inc[employer]  Nausea and Vomiting Nausea is a sick feeling that often comes before throwing up (vomiting). Vomiting is a reflex where stomach contents come out of your mouth. Vomiting can cause severe loss of body fluids (dehydration). Children and elderly adults can become dehydrated quickly, especially if they also have diarrhea. Nausea and vomiting are symptoms of a condition or disease. It is important to find the cause of your symptoms. CAUSES   Direct irritation of the stomach lining. This irritation can result from increased acid production (gastroesophageal reflux disease), infection, food poisoning, taking certain medicines (such as nonsteroidal anti-inflammatory drugs), alcohol use, or tobacco use.  Signals from the brain.These signals could be caused by a headache, heat exposure, an  inner ear disturbance, increased pressure in the brain from injury, infection, a tumor, or a concussion, pain, emotional stimulus, or metabolic problems.  An obstruction in the gastrointestinal tract (bowel obstruction).  Illnesses such as diabetes, hepatitis, gallbladder problems, appendicitis, kidney problems, cancer, sepsis, atypical symptoms of a heart attack, or eating disorders.  Medical treatments such as chemotherapy and radiation.  Receiving medicine that makes you sleep (general anesthetic) during surgery. DIAGNOSIS Your caregiver may ask for tests to be done if the problems do not improve after a few days. Tests may also be done if symptoms are severe or if the reason for the nausea and vomiting is not clear. Tests may include:  Urine tests.  Blood tests.  Stool tests.  Cultures (to look for evidence of infection).  X-rays or other imaging studies. Test results can help your caregiver make decisions about treatment or the need for additional tests. TREATMENT You need to stay well hydrated. Drink frequently but in small amounts.You may wish to drink water, sports drinks, clear broth, or eat frozen ice pops or gelatin dessert to help stay hydrated.When you eat, eating slowly may help prevent nausea.There are also some antinausea medicines that may help prevent nausea. HOME CARE  INSTRUCTIONS   Take all medicine as directed by your caregiver.  If you do not have an appetite, do not force yourself to eat. However, you must continue to drink fluids.  If you have an appetite, eat a normal diet unless your caregiver tells you differently.  Eat a variety of complex carbohydrates (rice, wheat, potatoes, bread), lean meats, yogurt, fruits, and vegetables.  Avoid high-fat foods because they are more difficult to digest.  Drink enough water and fluids to keep your urine clear or pale yellow.  If you are dehydrated, ask your caregiver for specific rehydration instructions. Signs  of dehydration may include:  Severe thirst.  Dry lips and mouth.  Dizziness.  Dark urine.  Decreasing urine frequency and amount.  Confusion.  Rapid breathing or pulse. SEEK IMMEDIATE MEDICAL CARE IF:   You have blood or brown flecks (like coffee grounds) in your vomit.  You have black or bloody stools.  You have a severe headache or stiff neck.  You are confused.  You have severe abdominal pain.  You have chest pain or trouble breathing.  You do not urinate at least once every 8 hours.  You develop cold or clammy skin.  You continue to vomit for longer than 24 to 48 hours.  You have a fever. MAKE SURE YOU:   Understand these instructions.  Will watch your condition.  Will get help right away if you are not doing well or get worse.   This information is not intended to replace advice given to you by your health care provider. Make sure you discuss any questions you have with your health care provider.   Document Released: 06/13/2005 Document Revised: 09/05/2011 Document Reviewed: 11/10/2010 Elsevier Interactive Patient Education 2016 Elsevier Inc.  Hypotension As your heart beats, it forces blood through your arteries. This force is your blood pressure. If your blood pressure is too low for you to go about your normal activities or to support the organs of your body, you have hypotension. Hypotension is also referred to as low blood pressure. When your blood pressure becomes too low, you may not get enough blood to your brain. As a result, you may feel weak, feel lightheaded, or develop a rapid heart rate. In a more severe case, you may faint. CAUSES Various conditions can cause hypotension. These include:  Blood loss.  Dehydration.  Heart or endocrine problems.  Pregnancy.  Severe infection.  Not having a well-balanced diet filled with needed nutrients.  Severe allergic reactions (anaphylaxis). Some medicines, such as blood pressure medicine or  water pills (diuretics), may lower your blood pressure below normal. Sometimes taking too much medicine or taking medicine not as directed can cause hypotension. TREATMENT  Hospitalization is sometimes required for hypotension if fluid or blood replacement is needed, if time is needed for medicines to wear off, or if further monitoring is needed. Treatment might include changing your diet, changing your medicines (including medicines aimed at raising your blood pressure), and use of support stockings. HOME CARE INSTRUCTIONS   Drink enough fluids to keep your urine clear or pale yellow.  Take your medicines as directed by your health care provider.  Get up slowly from reclining or sitting positions. This gives your blood pressure a chance to adjust.  Wear support stockings as directed by your health care provider.  Maintain a healthy diet by including nutritious food, such as fruits, vegetables, nuts, whole grains, and lean meats. SEEK MEDICAL CARE IF:  You have vomiting or diarrhea.  You have a fever for more than 2-3 days.  You feel more thirsty than usual.  You feel weak and tired. SEEK IMMEDIATE MEDICAL CARE IF:   You have chest pain or a fast or irregular heartbeat.  You have a loss of feeling in some part of your body, or you lose movement in your arms or legs.  You have trouble speaking.  You become sweaty or feel lightheaded.  You faint. MAKE SURE YOU:   Understand these instructions.  Will watch your condition.  Will get help right away if you are not doing well or get worse.   This information is not intended to replace advice given to you by your health care provider. Make sure you discuss any questions you have with your health care provider.   Document Released: 06/13/2005 Document Revised: 04/03/2013 Document Reviewed: 12/14/2012 Elsevier Interactive Patient Education [employer].

## 2015-11-30 ENCOUNTER — Emergency Department (HOSPITAL_COMMUNITY): Payer: Medicare Other

## 2015-11-30 ENCOUNTER — Inpatient Hospital Stay (HOSPITAL_COMMUNITY)
Admission: EM | Admit: 2015-11-30 | Discharge: 2015-12-03 | DRG: 684 | Disposition: A | Discharge status: Home / Self Care | Payer: Medicare Other | Attending: Internal Medicine | Admitting: Internal Medicine

## 2015-11-30 ENCOUNTER — Encounter (HOSPITAL_COMMUNITY): Payer: Self-pay | Admitting: Emergency Medicine

## 2015-11-30 DIAGNOSIS — I129 Hypertensive chronic kidney disease with stage 1 through stage 4 chronic kidney disease, or unspecified chronic kidney disease: Secondary | ICD-10-CM | POA: Diagnosis present

## 2015-11-30 DIAGNOSIS — E876 Hypokalemia: Secondary | ICD-10-CM | POA: Diagnosis not present

## 2015-11-30 DIAGNOSIS — N179 Acute kidney failure, unspecified: Principal | ICD-10-CM | POA: Diagnosis present

## 2015-11-30 DIAGNOSIS — Z8673 Personal history of transient ischemic attack (TIA), and cerebral infarction without residual deficits: Secondary | ICD-10-CM

## 2015-11-30 DIAGNOSIS — Z9181 History of falling: Secondary | ICD-10-CM

## 2015-11-30 DIAGNOSIS — E860 Dehydration: Secondary | ICD-10-CM | POA: Diagnosis present

## 2015-11-30 DIAGNOSIS — E785 Hyperlipidemia, unspecified: Secondary | ICD-10-CM | POA: Diagnosis present

## 2015-11-30 DIAGNOSIS — R531 Weakness: Secondary | ICD-10-CM | POA: Diagnosis not present

## 2015-11-30 DIAGNOSIS — Z7982 Long term (current) use of aspirin: Secondary | ICD-10-CM | POA: Diagnosis not present

## 2015-11-30 DIAGNOSIS — R739 Hyperglycemia, unspecified: Secondary | ICD-10-CM | POA: Diagnosis present

## 2015-11-30 DIAGNOSIS — I10 Essential (primary) hypertension: Secondary | ICD-10-CM | POA: Diagnosis not present

## 2015-11-30 DIAGNOSIS — Z7951 Long term (current) use of inhaled steroids: Secondary | ICD-10-CM | POA: Diagnosis not present

## 2015-11-30 DIAGNOSIS — Z8249 Family history of ischemic heart disease and other diseases of the circulatory system: Secondary | ICD-10-CM | POA: Diagnosis not present

## 2015-11-30 DIAGNOSIS — Z87891 Personal history of nicotine dependence: Secondary | ICD-10-CM

## 2015-11-30 DIAGNOSIS — Z79899 Other long term (current) drug therapy: Secondary | ICD-10-CM | POA: Diagnosis not present

## 2015-11-30 DIAGNOSIS — R410 Disorientation, unspecified: Secondary | ICD-10-CM | POA: Diagnosis not present

## 2015-11-30 DIAGNOSIS — D649 Anemia, unspecified: Secondary | ICD-10-CM | POA: Diagnosis present

## 2015-11-30 DIAGNOSIS — R918 Other nonspecific abnormal finding of lung field: Secondary | ICD-10-CM | POA: Diagnosis not present

## 2015-11-30 DIAGNOSIS — I2510 Atherosclerotic heart disease of native coronary artery without angina pectoris: Secondary | ICD-10-CM | POA: Diagnosis present

## 2015-11-30 DIAGNOSIS — N183 Chronic kidney disease, stage 3 (moderate): Secondary | ICD-10-CM | POA: Diagnosis present

## 2015-11-30 DIAGNOSIS — D509 Iron deficiency anemia, unspecified: Secondary | ICD-10-CM

## 2015-11-30 LAB — URINALYSIS, ROUTINE W REFLEX MICROSCOPIC
BILIRUBIN URINE: NEGATIVE
Glucose, UA: NEGATIVE mg/dL
Ketones, ur: NEGATIVE mg/dL
Leukocytes, UA: NEGATIVE
NITRITE: NEGATIVE
PH: 6 (ref 5.0–8.0)
Protein, ur: >300 mg/dL — AB
SPECIFIC GRAVITY, URINE: 1.025 (ref 1.005–1.030)

## 2015-11-30 LAB — CBC
HEMATOCRIT: 38.5 % — AB (ref 39.0–52.0)
HEMOGLOBIN: 12.3 g/dL — AB (ref 13.0–17.0)
MCH: 30 pg (ref 26.0–34.0)
MCHC: 31.9 g/dL (ref 30.0–36.0)
MCV: 93.9 fL (ref 78.0–100.0)
Platelets: 281 10*3/uL (ref 150–400)
RBC: 4.1 MIL/uL — AB (ref 4.22–5.81)
RDW: 15.2 % (ref 11.5–15.5)
WBC: 9.1 10*3/uL (ref 4.0–10.5)

## 2015-11-30 LAB — LIPASE, BLOOD: LIPASE: 72 U/L — AB (ref 11–51)

## 2015-11-30 LAB — URINE MICROSCOPIC-ADD ON

## 2015-11-30 LAB — BASIC METABOLIC PANEL
ANION GAP: 11 (ref 5–15)
BUN: 31 mg/dL — ABNORMAL HIGH (ref 6–20)
CO2: 27 mmol/L (ref 22–32)
Calcium: 9.1 mg/dL (ref 8.9–10.3)
Chloride: 102 mmol/L (ref 101–111)
Creatinine, Ser: 3.13 mg/dL — ABNORMAL HIGH (ref 0.61–1.24)
GFR, EST AFRICAN AMERICAN: 23 mL/min — AB (ref >60)
GFR, EST NON AFRICAN AMERICAN: 20 mL/min — AB (ref >60)
Glucose, Bld: 119 mg/dL — ABNORMAL HIGH (ref 65–99)
Potassium: 2.5 mmol/L — CL (ref 3.5–5.1)
Sodium: 140 mmol/L (ref 135–145)

## 2015-11-30 LAB — TROPONIN I: Troponin I: <0.03 ng/mL (ref <0.031)

## 2015-11-30 MED ORDER — SODIUM CHLORIDE 0.9% FLUSH
3.0000 mL | Freq: Two times a day (BID) | INTRAVENOUS | Status: DC
  Administered 2015-12-01: 3 mL via INTRAVENOUS

## 2015-11-30 MED ORDER — HYDROCODONE-ACETAMINOPHEN 5-325 MG PO TABS: 1.0000 | ORAL_TABLET | Freq: Three times a day (TID) | ORAL | Status: DC | PRN

## 2015-11-30 MED ORDER — METOPROLOL SUCCINATE ER 50 MG PO TB24
50.0000 mg | ORAL_TABLET | Freq: Two times a day (BID) | ORAL | Status: DC
  Administered 2015-12-01 – 2015-12-03 (×5): 50 mg via ORAL
  Filled 2015-11-30 (×6): qty 1

## 2015-11-30 MED ORDER — VITAMIN B-12 1000 MCG PO TABS: 1000.0000 ug | ORAL_TABLET | Freq: Every day | ORAL | Status: DC

## 2015-11-30 MED ORDER — ADULT MULTIVITAMIN W/MINERALS CH
1.0000 | ORAL_TABLET | Freq: Every day | ORAL | Status: DC
  Administered 2015-12-01 – 2015-12-03 (×3): 1 via ORAL
  Filled 2015-11-30 (×3): qty 1

## 2015-11-30 MED ORDER — TAMSULOSIN HCL 0.4 MG PO CAPS
0.4000 mg | ORAL_CAPSULE | Freq: Every day | ORAL | Status: DC
Start: 2015-12-01 — End: 2015-12-03
  Administered 2015-12-01 – 2015-12-03 (×3): 0.4 mg via ORAL
  Filled 2015-11-30 (×3): qty 1

## 2015-11-30 MED ORDER — TIOTROPIUM BROMIDE MONOHYDRATE 18 MCG IN CAPS
18.0000 ug | ORAL_CAPSULE | Freq: Every day | RESPIRATORY_TRACT | Status: DC | PRN
  Filled 2015-11-30: qty 5

## 2015-11-30 MED ORDER — THIAMINE HCL 100 MG/ML IJ SOLN: 100.0000 mg | Freq: Once | INTRAMUSCULAR | Status: DC

## 2015-11-30 MED ORDER — ISOSORBIDE MONONITRATE 20 MG PO TABS
10.0000 mg | ORAL_TABLET | Freq: Every day | ORAL | Status: DC
  Administered 2015-12-01 – 2015-12-03 (×3): 10 mg via ORAL
  Filled 2015-11-30 (×3): qty 1

## 2015-11-30 MED ORDER — HEPARIN SODIUM (PORCINE) 5000 UNIT/ML IJ SOLN
5000.0000 [IU] | Freq: Three times a day (TID) | INTRAMUSCULAR | Status: DC
  Administered 2015-11-30 – 2015-12-03 (×7): 5000 [IU] via SUBCUTANEOUS
  Filled 2015-11-30 (×7): qty 1

## 2015-11-30 MED ORDER — SODIUM CHLORIDE 0.9 % IV SOLN: INTRAVENOUS | Status: DC

## 2015-11-30 MED ORDER — TRAMADOL HCL 50 MG PO TABS: 50.0000 mg | ORAL_TABLET | Freq: Four times a day (QID) | ORAL | Status: DC | PRN

## 2015-11-30 MED ORDER — CYANOCOBALAMIN 1000 MCG/ML IJ SOLN
1000.0000 ug | INTRAMUSCULAR | Status: DC
  Administered 2015-12-01: 1000 ug via INTRAMUSCULAR
  Filled 2015-11-30: qty 1

## 2015-11-30 MED ORDER — ALPRAZOLAM 1 MG PO TABS
1.0000 mg | ORAL_TABLET | Freq: Two times a day (BID) | ORAL | Status: DC | PRN
  Administered 2015-11-30 – 2015-12-02 (×5): 1 mg via ORAL
  Filled 2015-11-30 (×5): qty 1

## 2015-11-30 MED ORDER — BACLOFEN 10 MG PO TABS
20.0000 mg | ORAL_TABLET | Freq: Two times a day (BID) | ORAL | Status: DC | PRN
  Administered 2015-11-30: 20 mg via ORAL
  Filled 2015-11-30: qty 2

## 2015-11-30 MED ORDER — ALBUTEROL SULFATE (2.5 MG/3ML) 0.083% IN NEBU: 3.0000 mL | INHALATION_SOLUTION | Freq: Four times a day (QID) | RESPIRATORY_TRACT | Status: DC | PRN

## 2015-11-30 MED ORDER — GABAPENTIN 300 MG PO CAPS
300.0000 mg | ORAL_CAPSULE | Freq: Three times a day (TID) | ORAL | Status: DC
  Administered 2015-11-30 – 2015-12-03 (×7): 300 mg via ORAL
  Filled 2015-11-30 (×7): qty 1

## 2015-11-30 MED ORDER — SODIUM CHLORIDE 0.9 % IV BOLUS (SEPSIS)
1000.0000 mL | Freq: Once | INTRAVENOUS | Status: AC
  Administered 2015-11-30: 1000 mL via INTRAVENOUS

## 2015-11-30 MED ORDER — DULOXETINE HCL 60 MG PO CPEP
60.0000 mg | ORAL_CAPSULE | Freq: Every day | ORAL | Status: DC
  Administered 2015-12-01 – 2015-12-03 (×3): 60 mg via ORAL
  Filled 2015-11-30 (×3): qty 1

## 2015-11-30 MED ORDER — THIAMINE HCL 100 MG/ML IJ SOLN
100.0000 mg | Freq: Every day | INTRAMUSCULAR | Status: DC
  Administered 2015-11-30 – 2015-12-01 (×2): 100 mg via INTRAVENOUS
  Filled 2015-11-30 (×2): qty 2

## 2015-11-30 MED ORDER — FOLIC ACID 1 MG PO TABS
1.0000 mg | ORAL_TABLET | Freq: Every day | ORAL | Status: DC
  Administered 2015-12-01 – 2015-12-03 (×3): 1 mg via ORAL
  Filled 2015-11-30 (×3): qty 1

## 2015-11-30 MED ORDER — POTASSIUM CHLORIDE 10 MEQ/100ML IV SOLN
10.0000 meq | INTRAVENOUS | Status: AC
  Administered 2015-11-30 (×4): 10 meq via INTRAVENOUS
  Filled 2015-11-30 (×4): qty 100

## 2015-11-30 MED ORDER — ASPIRIN EC 81 MG PO TBEC
81.0000 mg | DELAYED_RELEASE_TABLET | Freq: Every day | ORAL | Status: DC
  Administered 2015-12-01 – 2015-12-03 (×3): 81 mg via ORAL
  Filled 2015-11-30 (×3): qty 1

## 2015-11-30 MED ORDER — ROSUVASTATIN CALCIUM 10 MG PO TABS
10.0000 mg | ORAL_TABLET | Freq: Every day | ORAL | Status: DC
  Administered 2015-11-30 – 2015-12-01 (×2): 10 mg via ORAL
  Filled 2015-11-30 (×2): qty 1

## 2015-11-30 MED ORDER — CLOPIDOGREL BISULFATE 75 MG PO TABS
75.0000 mg | ORAL_TABLET | Freq: Every day | ORAL | Status: DC
  Administered 2015-12-01 – 2015-12-03 (×3): 75 mg via ORAL
  Filled 2015-11-30 (×3): qty 1

## 2015-11-30 NOTE — ED Notes (Signed)
CRITICAL VALUE ALERT  Critical value received:  Potassium 2.5  Date of notification:  t  Time of notification:  [id number]  Critical value read back:Yes.    Nurse who received alert:  Nolon Stalls, RN  MD notified (1st page):  Dr. Sabra Heck  Time of first page:  27  MD notified (2nd page):  Time of second page:  Responding MD:  Dr. Sabra Heck  Time MD responded:  1240

## 2015-11-30 NOTE — ED Notes (Signed)
Pt c/o generalized weakness/fatigue and confusion x 3 days. Pt states he was seen in ED last week for same and was dehydrated. Denies pain.

## 2015-11-30 NOTE — ED Notes (Signed)
PT oxygen level <90% while asleep, pt on 2L per N/C at this time.

## 2015-11-30 NOTE — ED Provider Notes (Signed)
CSN: [id number]     Arrival date & time 11/30/15  1126 History   First MD Initiated Contact with Patient 11/30/15 1241     Chief Complaint  Patient presents with  . Weakness     (Consider location/radiation/quality/duration/timing/severity/associated sxs/prior Treatment) HPI Comments: Patient is a 60 year old male with a history of coronary artery disease, hypertension, hyperlipidemia, multiple strokes presenting today with 3 days of worsening generalized weakness, recurrent falls somewhere he has hit his head, confusion and intermittent vomiting. Wife states yesterday he vomited more than 4 times and today he's already vomited 3 times. No recent medication changes that he was in the emergency room last week for similar symptoms at that time he was found to be dehydrated. Patient's wife states that she has been making him drink fluids but he will not eat. He denies any pain or headaches. No diarrhea. No chest pain or shortness of breath.  Patient has not used any alcohol in over 2 weeks.  Patient is a 60 y.o. male presenting with weakness. The history is provided by the spouse.  Weakness This is a recurrent problem.    Past Medical History  Diagnosis Date  . Coronary artery disease   . Hypertension   . Hyperlipemia   . Anxiety   . Depression   . Chronic pain   . Neuropathy ([place])   . Pernicious anemia   . Stroke ([place])   . BPH (benign prostatic hyperplasia)    Past Surgical History  Procedure Laterality Date  . Carotid endarterectomy    . Cardiac catheterization     No family history on file. Social History  Substance Use Topics  . Smoking status: Current Every Day Smoker    Types: Cigarettes  . Smokeless tobacco: None     Comment: down to 5 cigarettes/day from 1.5 PPD  . Alcohol Use: 0.0 oz/week    0 Standard drinks or equivalent per week    Review of Systems  Neurological: Positive for weakness.  All other systems reviewed and are negative.     Allergies   Codeine  Home Medications   Prior to Admission medications   Medication Sig Start Date End Date Taking? Authorizing Provider  albuterol (PROVENTIL HFA;VENTOLIN HFA) 108 (90 BASE) MCG/ACT inhaler Inhale 2 puffs into the lungs every 6 (six) hours as needed for wheezing or shortness of breath.   Yes Historical Provider, MD  ALPRAZolam Duanne Moron) 1 MG tablet Take 1 mg by mouth 2 (two) times daily as needed for anxiety.   Yes Historical Provider, MD  aspirin EC 81 MG tablet Take 81 mg by mouth daily. Reported on 11/16/2015   Yes Historical Provider, MD  baclofen (LIORESAL) 20 MG tablet Take 20 mg by mouth 2 (two) times daily as needed for muscle spasms.   Yes Historical Provider, MD  clopidogrel (PLAVIX) 75 MG tablet Take 75 mg by mouth daily.   Yes Historical Provider, MD  Cyanocobalamin (B-12) 1000 MCG/ML KIT Inject 1,000 mcg as directed every 30 (thirty) days.   Yes Historical Provider, MD  DULoxetine (CYMBALTA) 60 MG capsule Take 60 mg by mouth daily.   Yes Historical Provider, MD  enalapril (VASOTEC) 10 MG tablet Take 10 mg by mouth daily.   Yes Historical Provider, MD  folic acid (FOLVITE) 1 MG tablet Take 1 tablet (1 mg total) by mouth daily. 09/12/14  Yes Samuella Cota, MD  gabapentin (NEURONTIN) 800 MG tablet Take 1,200 mg by mouth 3 (three) times daily.    Yes Historical  Provider, MD  HYDROcodone-acetaminophen (NORCO/VICODIN) 5-325 MG per tablet Take 1 tablet by mouth 3 (three) times daily as needed for moderate pain.   Yes Historical Provider, MD  isosorbide mononitrate (ISMO,MONOKET) 20 MG tablet Take 10 mg by mouth daily.   Yes Historical Provider, MD  metoprolol succinate (TOPROL-XL) 50 MG 24 hr tablet Take 50 mg by mouth 2 (two) times daily. Take with or immediately following a meal.   Yes Historical Provider, MD  Multiple Vitamin (MULTIVITAMIN WITH MINERALS) TABS tablet Take 1 tablet by mouth daily. 09/12/14  Yes Samuella Cota, MD  rosuvastatin (CRESTOR) 40 MG tablet Take 40 mg by  mouth daily.   Yes Historical Provider, MD  tamsulosin (FLOMAX) 0.4 MG CAPS capsule Take 0.4 mg by mouth daily.   Yes Historical Provider, MD  tiotropium (SPIRIVA) 18 MCG inhalation capsule Place 18 mcg into inhaler and inhale daily as needed (for shortness of breath).    Yes Historical Provider, MD  traMADol (ULTRAM) 50 MG tablet Take 50 mg by mouth 4 (four) times daily as needed for moderate pain (patient states started on 10/15/14 by primary care for knee pain; reports taking approximately once daily). 10/15/14  Yes Caren Macadam, MD  vitamin B-12 (CYANOCOBALAMIN) 1000 MCG tablet Take 1,000 mcg by mouth daily.   Yes Historical Provider, MD  ondansetron (ZOFRAN ODT) 4 MG disintegrating tablet Take 1 tablet (4 mg total) by mouth every 8 (eight) hours as needed for nausea. Patient not taking: Reported on 11/30/2015 11/16/15   Tanna Furry, MD   BP 109/80 mmHg  Pulse 66  Temp(Src) 98.9 F (37.2 C)  Resp 18  Ht '5\' 3"'  (1.6 m)  Wt 190 lb (86.183 kg)  BMI 33.67 kg/m2  SpO2 97% Physical Exam  Constitutional: He appears well-developed and well-nourished.  Somnolent but will wake up to voice and then goes right back to sleep  HENT:  Head: Normocephalic and atraumatic.  Mouth/Throat: Mucous membranes are dry.  Eyes: Conjunctivae and EOM are normal. Pupils are equal, round, and reactive to light.  Neck: Normal range of motion. Neck supple.  Cardiovascular: Normal rate, regular rhythm and intact distal pulses.   No murmur heard. Pulmonary/Chest: Effort normal and breath sounds normal. No respiratory distress. He has no wheezes. He has no rales.  Abdominal: Soft. He exhibits no distension. There is no tenderness. There is no rebound and no guarding.  Musculoskeletal: Normal range of motion. He exhibits no edema or tenderness.  Neurological:  Tremulous when moving the upper extremities but no sign of pronator drift. Mild weakness in the right lower extremity. Sensation intact.  Skin: Skin is warm  and dry. No rash noted. No erythema.  Psychiatric: He has a normal mood and affect. His behavior is normal.  Nursing note and vitals reviewed.   ED Course  Procedures (including critical care time) Labs Review Labs Reviewed  BASIC METABOLIC PANEL - Abnormal; Notable for the following:    Potassium 2.5 (*)    Glucose, Bld 119 (*)    BUN 31 (*)    Creatinine, Ser 3.13 (*)    GFR calc non Af Amer 20 (*)    GFR calc Af Amer 23 (*)    All other components within normal limits  CBC - Abnormal; Notable for the following:    RBC 4.10 (*)    Hemoglobin 12.3 (*)    HCT 38.5 (*)    All other components within normal limits  URINALYSIS, ROUTINE W REFLEX MICROSCOPIC (NOT AT  [facility]) - Abnormal; Notable for the following:    Hgb urine dipstick LARGE (*)    Protein, ur >300 (*)    All other components within normal limits  URINE MICROSCOPIC-ADD ON - Abnormal; Notable for the following:    Squamous Epithelial / LPF 0-5 (*)    Bacteria, UA FEW (*)    Casts GRANULAR CAST (*)    All other components within normal limits  TROPONIN I    Imaging Review Dg Chest Port 1 View  11/30/2015  CLINICAL DATA:  Weakness and fatigue. EXAM: PORTABLE CHEST 1 VIEW COMPARISON:  September 10, 2014 FINDINGS: Stable cardiomegaly. Mild atelectasis in the left lung base. The hila and mediastinum are normal. No pulmonary nodules or masses. No other infiltrates. IMPRESSION: Mild opacity in the left lung base is favored to represent atelectasis. Mild cardiomegaly. No other changes. Electronically Signed   By: Dorise Bullion III M.D   On: 11/30/2015 13:40   I have personally reviewed and evaluated these images and lab results as part of my medical decision-making.   EKG Interpretation   Date/Time:  Monday November 30 2015 12:32:35 EDT Ventricular Rate:  51 PR Interval:  178 QRS Duration: 104 QT Interval:  452 QTC Calculation: 416 R Axis:   -30 Text Interpretation:  Sinus bradycardia Left axis deviation Minimal  voltage  criteria for LVH, may be normal variant Septal infarct , age  undetermined No significant change since last tracing Confirmed by  [facility]  MD, Loree Fee ([id number]) on 11/30/2015 12:57:30 PM      MDM   Final diagnoses:  Acute renal failure, unspecified acute renal failure type ([place])  Hypokalemia  Dehydration    Patient is a 60 year old male with multiple medical problems presenting today with worsening weakness, altered mental status, vomiting that is recurrent from last week. This time and has occurred over the last 3 days. Wife states now patient is unable to get out of bed and ambulate. He has fallen multiple times sometimes hitting his head but is plavix at this time.  Patient can follow commands on exam but is very somnolent. Pupils are reactive. No focal neurologic deficit the patient is very drowsy and only oriented to person.  No infectious symptoms such as cough or fever but patient has had vomiting. Concern for possible head bleed. He has not drank any alcohol in over 2 weeks and low suspicion for withdrawal at this time. Patient is dehydrated appears to be prerenal with new acute renal failure with a creatinine of 3.13 and a hypokalemia of 2.5. CBC within normal limits. Troponin within normal limits.  Chest x-ray without acute findings. Head CT pending  3:50 PM Head CT is negative. Patient does have a history of alcohol abuse. Wife states that he has not had any alcohol in 2 weeks. Confusion and vomiting may still be related to withdrawal however he is not tachycardic or hypertensive today.  Blanchie Dessert, MD 11/30/15 [phone]

## 2015-11-30 NOTE — ED Notes (Signed)
Report given to [place], RN for room 334.

## 2015-11-30 NOTE — H&P (Signed)
TRH H&P   Patient Demographics:    George Valdez, is a 60 y.o. male  MRN: [id number]   DOB - [birth year]  Admit Date - 11/30/2015  Outpatient Primary MD for the patient is Jana Half  Referring MD/NP/PA: Blanchie Dessert  Outpatient Specialists: no cardiologist  Patient coming from: home  Chief Complaint  Patient presents with  . Weakness      HPI:    George Valdez  is a 60 y.o. male, CAD, w ckd stage3, apparently w prior creatiine 1.86 2 weeks ago, presented with c/o shaking and tremor, and generalized weakness. Slight dyspnea with exertion.  Denies cp, palp, focal neurological signs.  Pt has nocturia. 2-3 x per nite. Stream weak.  Last urinated in the ED. Some otc ibuprofen use.  Pt denies otherwise any change in medication.    Pt will be admitted ARF, Hypokalemia.       Review of systems:    In addition to the HPI above, No Fever-chills, No Headache, No changes with Vision or hearing, No problems swallowing food or Liquids, No Chest pain, Cough  No Abdominal pain, No Nausea or Vommitting, Bowel movements are regular, No Blood in stool or Urine, No dysuria, No new skin rashes or bruises, No new joints pains-aches,  No new weakness, tingling, numbness in any extremity, No recent weight gain or loss, No polyuria, polydypsia or polyphagia, No significant Mental Stressors.  A full 10 point Review of Systems was done, except as stated above, all other Review of Systems were negative.   With Past History of the following :    Past Medical History  Diagnosis Date  . Coronary artery disease   . Hypertension   . Hyperlipemia   . Anxiety   . Depression   . Chronic pain   . Neuropathy ([place])   . Pernicious anemia   . Stroke ([place])   . BPH (benign prostatic hyperplasia)       Past Surgical History  Procedure Laterality Date  . Carotid  endarterectomy    . Cardiac catheterization        Social History:     Social History  Substance Use Topics  . Smoking status: Former Smoker -- 1.00 packs/day for 40 years    Types: Cigarettes    Quit date: 11/23/2015  . Smokeless tobacco: Not on file     Comment: down to 5 cigarettes/day from 1.5 PPD  . Alcohol Use: 7.2 oz/week    12 Standard drinks or equivalent per week     Lives - at home with wife  Mobility - able to walk by self   Family History :     Family History  Problem Relation Age of Onset  . Heart attack Father 55  . Congestive Heart Failure Mother 50  . Congestive Heart Failure Sister       Home Medications:   Prior  to Admission medications   Medication Sig Start Date End Date Taking? Authorizing Provider  albuterol (PROVENTIL HFA;VENTOLIN HFA) 108 (90 BASE) MCG/ACT inhaler Inhale 2 puffs into the lungs every 6 (six) hours as needed for wheezing or shortness of breath.   Yes Historical Provider, MD  ALPRAZolam Duanne Moron) 1 MG tablet Take 1 mg by mouth 2 (two) times daily as needed for anxiety.   Yes Historical Provider, MD  aspirin EC 81 MG tablet Take 81 mg by mouth daily. Reported on 11/16/2015   Yes Historical Provider, MD  baclofen (LIORESAL) 20 MG tablet Take 20 mg by mouth 2 (two) times daily as needed for muscle spasms.   Yes Historical Provider, MD  clopidogrel (PLAVIX) 75 MG tablet Take 75 mg by mouth daily.   Yes Historical Provider, MD  Cyanocobalamin (B-12) 1000 MCG/ML KIT Inject 1,000 mcg as directed every 30 (thirty) days.   Yes Historical Provider, MD  DULoxetine (CYMBALTA) 60 MG capsule Take 60 mg by mouth daily.   Yes Historical Provider, MD  enalapril (VASOTEC) 10 MG tablet Take 10 mg by mouth daily.   Yes Historical Provider, MD  folic acid (FOLVITE) 1 MG tablet Take 1 tablet (1 mg total) by mouth daily. 09/12/14  Yes Samuella Cota, MD  gabapentin (NEURONTIN) 800 MG tablet Take 1,200 mg by mouth 3 (three) times daily.    Yes Historical  Provider, MD  HYDROcodone-acetaminophen (NORCO/VICODIN) 5-325 MG per tablet Take 1 tablet by mouth 3 (three) times daily as needed for moderate pain.   Yes Historical Provider, MD  isosorbide mononitrate (ISMO,MONOKET) 20 MG tablet Take 10 mg by mouth daily.   Yes Historical Provider, MD  metoprolol succinate (TOPROL-XL) 50 MG 24 hr tablet Take 50 mg by mouth 2 (two) times daily. Take with or immediately following a meal.   Yes Historical Provider, MD  Multiple Vitamin (MULTIVITAMIN WITH MINERALS) TABS tablet Take 1 tablet by mouth daily. 09/12/14  Yes Samuella Cota, MD  rosuvastatin (CRESTOR) 40 MG tablet Take 40 mg by mouth daily.   Yes Historical Provider, MD  tamsulosin (FLOMAX) 0.4 MG CAPS capsule Take 0.4 mg by mouth daily.   Yes Historical Provider, MD  tiotropium (SPIRIVA) 18 MCG inhalation capsule Place 18 mcg into inhaler and inhale daily as needed (for shortness of breath).    Yes Historical Provider, MD  traMADol (ULTRAM) 50 MG tablet Take 50 mg by mouth 4 (four) times daily as needed for moderate pain (patient states started on 10/15/14 by primary care for knee pain; reports taking approximately once daily). 10/15/14  Yes Caren Macadam, MD  vitamin B-12 (CYANOCOBALAMIN) 1000 MCG tablet Take 1,000 mcg by mouth daily.   Yes Historical Provider, MD  ondansetron (ZOFRAN ODT) 4 MG disintegrating tablet Take 1 tablet (4 mg total) by mouth every 8 (eight) hours as needed for nausea. Patient not taking: Reported on 11/30/2015 11/16/15   Tanna Furry, MD     Allergies:     Allergies  Allergen Reactions  . Codeine Itching     Physical Exam:   Vitals  Blood pressure 141/83, pulse 50, temperature 98.9 F (37.2 C), resp. rate 15, height _0  (1.6 m), weight 86.183 kg (190 lb), SpO2 100 %.   1. General lying in bed in NAD,    2. Normal affect and insight, Not Suicidal or Homicidal, Awake Alert, Oriented X 3.  3. No F.N deficits, ALL C.Nerves Intact, Strength 5/5 all 4 extremities,  Sensation intact all 4 extremities,  Plantars down going.  4. Ears and Eyes appear Normal, Conjunctivae clear, PERRLA. Moist Oral Mucosa.  5. Supple Neck, No JVD, No cervical lymphadenopathy appriciated, No Carotid Bruits.  R CEA scar  6. Symmetrical Chest wall movement, Good air movement bilaterally, CTAB.  7. RRR, No Gallops, Rubs or Murmurs, No Parasternal Heave.  8. Positive Bowel Sounds, Abdomen Soft, No tenderness, No organomegaly appriciated,No rebound -guarding or rigidity.  9.  No Cyanosis, Normal Skin Turgor, No Skin Rash or Bruise.  10. Good muscle tone,  joints appear normal , no effusions, Normal ROM.  11. No Palpable Lymph Nodes in Neck or Axillae   Data Review:    CBC  Recent Labs Lab 11/30/15 1154  WBC 9.1  HGB 12.3*  HCT 38.5*  PLT 281  MCV 93.9  MCH 30.0  MCHC 31.9  RDW 15.2   ------------------------------------------------------------------------------------------------------------------  Chemistries   Recent Labs Lab 11/30/15 1154  NA 140  K 2.5*  CL 102  CO2 27  GLUCOSE 119*  BUN 31*  CREATININE 3.13*  CALCIUM 9.1   ------------------------------------------------------------------------------------------------------------------ estimated creatinine clearance is 24 mL/min (by C-G formula based on Cr of 3.13). ------------------------------------------------------------------------------------------------------------------ No results for input(s): TSH, T4TOTAL, T3FREE, THYROIDAB in the last 72 hours.  Invalid input(s): FREET3  Coagulation profile No results for input(s): INR, PROTIME in the last 168 hours. ------------------------------------------------------------------------------------------------------------------- No results for input(s): DDIMER in the last 72 hours. -------------------------------------------------------------------------------------------------------------------  Cardiac Enzymes  Recent Labs Lab  11/30/15 1154  TROPONINI <0.03   ------------------------------------------------------------------------------------------------------------------ No results found for: BNP   ---------------------------------------------------------------------------------------------------------------  Urinalysis    Component Value Date/Time   COLORURINE YELLOW 11/30/2015 [place] 11/30/2015 1245   LABSPEC 1.025 11/30/2015 1245   PHURINE 6.0 11/30/2015 1245   GLUCOSEU NEGATIVE 11/30/2015 1245   HGBUR LARGE* 11/30/2015 1245   [place] 11/30/2015 1245   KETONESUR NEGATIVE 11/30/2015 1245   PROTEINUR >300* 11/30/2015 1245   UROBILINOGEN 0.2 09/10/2014 0908   NITRITE NEGATIVE 11/30/2015 1245   LEUKOCYTESUR NEGATIVE 11/30/2015 1245    ----------------------------------------------------------------------------------------------------------------   Imaging Results:    Ct Head Wo Contrast  11/30/2015  CLINICAL DATA:  Generalized weakness, confusion for 3 days EXAM: CT HEAD WITHOUT CONTRAST TECHNIQUE: Contiguous axial images were obtained from the base of the skull through the vertex without intravenous contrast. COMPARISON:  11/15/2005 FINDINGS: Brain: No intracranial hemorrhage, mass effect or midline shift. Stable atrophy and chronic periventricular white matter disease. No acute cortical infarction. Stable old infarct in left cerebellum stable small lacunar infarct in left basal ganglia. No mass lesion is noted on this unenhanced scan. Vascular: Atherosclerotic calcifications of carotid siphon are noted. Skull: No skull fracture is noted. No destructive bony lesions are seen. Sinuses/Orbits: Paranasal sinuses are unremarkable. No intraorbital hematoma. Other: None IMPRESSION: No acute intracranial abnormality. Stable atrophy and chronic white matter disease. Stable old infarct in left cerebellum. Stable small lacunar infarct left basal ganglia. No acute cortical infarction.  Electronically Signed   By: Lahoma Crocker M.D.   On: 11/30/2015 15:25   Dg Chest Port 1 View  11/30/2015  CLINICAL DATA:  Weakness and fatigue. EXAM: PORTABLE CHEST 1 VIEW COMPARISON:  September 10, 2014 FINDINGS: Stable cardiomegaly. Mild atelectasis in the left lung base. The hila and mediastinum are normal. No pulmonary nodules or masses. No other infiltrates. IMPRESSION: Mild opacity in the left lung base is favored to represent atelectasis. Mild cardiomegaly. No other changes. Electronically Signed   By: Dorise Bullion III M.D   On:  11/30/2015 13:40       Assessment & Plan:    Principal Problem:   ARF (acute renal failure) (HCC) Active Problems:   HTN (hypertension)   CAD (coronary artery disease)   Anemia   Hypokalemia    1. ARF  Check urine sodium, urine creatinine, urine eosinophils Check renal ultrasound.  Foley catheter  2.  Hypokalemia Replete in ED Check bmp in am Check magnesium in am  3. Anemia Repeat cbc in am Check spep, upep  4.  CAD Cont current medications.   5.  Hyperglycemia Check hga1c in am  DVT Prophylaxis Heparin - SCDs   AM Labs Ordered, also please review Full Orders  Family Communication: Admission, patients condition and plan of care including tests being ordered have been discussed with the patient and wife  who indicate understanding and agree with the plan and Code Status.  Code Status FULL CODE  Likely DC to  home  Condition GUARDED    Consults called: none  Admission status: inpatient   Time spent in minutes : 35 minutes   Jani Gravel M.D on 11/30/2015 at 4:40 PM  Between 7am to 7pm - Pager - [phone]. After 7pm go to [email or web address] - password [email or web address]  [facility] - Office  [phone]

## 2015-12-01 ENCOUNTER — Inpatient Hospital Stay (HOSPITAL_COMMUNITY): Payer: Medicare Other

## 2015-12-01 DIAGNOSIS — I2510 Atherosclerotic heart disease of native coronary artery without angina pectoris: Secondary | ICD-10-CM

## 2015-12-01 DIAGNOSIS — I10 Essential (primary) hypertension: Secondary | ICD-10-CM

## 2015-12-01 DIAGNOSIS — E876 Hypokalemia: Secondary | ICD-10-CM

## 2015-12-01 LAB — CBC
HCT: 35.2 % — ABNORMAL LOW (ref 39.0–52.0)
HEMOGLOBIN: 11.3 g/dL — AB (ref 13.0–17.0)
MCH: 30.4 pg (ref 26.0–34.0)
MCHC: 32.1 g/dL (ref 30.0–36.0)
MCV: 94.6 fL (ref 78.0–100.0)
PLATELETS: 267 10*3/uL (ref 150–400)
RBC: 3.72 MIL/uL — AB (ref 4.22–5.81)
RDW: 15.6 % — ABNORMAL HIGH (ref 11.5–15.5)
WBC: 7.7 10*3/uL (ref 4.0–10.5)

## 2015-12-01 LAB — COMPREHENSIVE METABOLIC PANEL
ALK PHOS: 65 U/L (ref 38–126)
ALT: 9 U/L — AB (ref 17–63)
ANION GAP: 7 (ref 5–15)
AST: 14 U/L — ABNORMAL LOW (ref 15–41)
Albumin: 3.1 g/dL — ABNORMAL LOW (ref 3.5–5.0)
BUN: 27 mg/dL — ABNORMAL HIGH (ref 6–20)
CALCIUM: 8.3 mg/dL — AB (ref 8.9–10.3)
CO2: 22 mmol/L (ref 22–32)
CREATININE: 2.32 mg/dL — AB (ref 0.61–1.24)
Chloride: 112 mmol/L — ABNORMAL HIGH (ref 101–111)
GFR, EST AFRICAN AMERICAN: 33 mL/min — AB (ref >60)
GFR, EST NON AFRICAN AMERICAN: 29 mL/min — AB (ref >60)
Glucose, Bld: 100 mg/dL — ABNORMAL HIGH (ref 65–99)
Potassium: 2.6 mmol/L — CL (ref 3.5–5.1)
Sodium: 141 mmol/L (ref 135–145)
Total Bilirubin: 0.3 mg/dL (ref 0.3–1.2)
Total Protein: 6.2 g/dL — ABNORMAL LOW (ref 6.5–8.1)

## 2015-12-01 LAB — MAGNESIUM: Magnesium: 1.9 mg/dL (ref 1.7–2.4)

## 2015-12-01 MED ORDER — POTASSIUM CHLORIDE 10 MEQ/100ML IV SOLN
10.0000 meq | INTRAVENOUS | Status: AC
  Administered 2015-12-01 (×3): 10 meq via INTRAVENOUS
  Filled 2015-12-01 (×2): qty 100

## 2015-12-01 MED ORDER — POTASSIUM CHLORIDE 10 MEQ/100ML IV SOLN
INTRAVENOUS | Status: AC
Start: 2015-12-01 — End: 2015-12-01
  Administered 2015-12-01: 10 meq
  Filled 2015-12-01: qty 100

## 2015-12-01 MED ORDER — LORAZEPAM 0.5 MG PO TABS
0.5000 mg | ORAL_TABLET | Freq: Once | ORAL | Status: AC
  Administered 2015-12-01: 0.5 mg via ORAL
  Filled 2015-12-01: qty 1

## 2015-12-01 MED ORDER — VITAMIN B-1 100 MG PO TABS
100.0000 mg | ORAL_TABLET | Freq: Every day | ORAL | Status: DC
  Administered 2015-12-02 – 2015-12-03 (×2): 100 mg via ORAL
  Filled 2015-12-01 (×2): qty 1

## 2015-12-01 MED ORDER — POTASSIUM CHLORIDE IN NACL 40-0.9 MEQ/L-% IV SOLN
INTRAVENOUS | Status: DC
  Administered 2015-12-01 – 2015-12-02 (×3): 100 mL/h via INTRAVENOUS

## 2015-12-01 NOTE — Care Management Note (Signed)
Case Management Note  Patient Details  Name: George Valdez MRN: [id number] Date of Birth: [birth year]  Subjective/Objective: Patient is from home with wife. He is independent with ADL's. He has a cane and walker. Wife drives him to appointments. He reports no issues with affording medications. Has medicare insurance. He goes to [employer] for [employer], sees Delman Cheadle, NP. Patient has used [facility] services in the past but elects not to use them again.        Action/Plan:No CM needs identified.    Expected Discharge Date:     12/02/2015             Expected Discharge Plan:  Home/Self Care  In-House Referral:     Discharge planning Services  CM Consult  Post Acute Care Choice:  NA Choice offered to:  NA  DME Arranged:    DME Agency:     HH Arranged:    HH Agency:     Status of Service:  Completed, signed off  Medicare Important Message Given:    Date Medicare IM Given:    Medicare IM give by:    Date Additional Medicare IM Given:    Additional Medicare Important Message give by:     If discussed at [place] of Stay Meetings, dates discussed:    Additional Comments:  Beyounce Dickens, Chauncey Reading, RN 12/01/2015, 2:56 PM

## 2015-12-01 NOTE — Progress Notes (Signed)
[facility] PROGRESS NOTE  George Valdez FIE:[id number] DOB: 19-Apr-1956    PCP:   Jana Half   HPI: George Valdez is an 60 y.o. male with hx of CKD III, CAD, lives at home with his wife, admitted with AKI Cr of 3, and hypokalemia.  He has had no diarrhea, and has not been on diuretics.  He was given IVF, and replacement of K, and his Cr has improved today.  His K was still low.  Mag was OK.  He has no complaint today.  During his admission, he had a renal US showing medical renal disease.  For his anemia, his Hb has been stable.  For his CAD, he was continued on his home meds, and had no acute events.    Rewiew of Systems:  Constitutional: Negative for malaise, fever and chills. No significant weight loss or weight gain Eyes: Negative for eye pain, redness and discharge, diplopia, visual changes, or flashes of light. ENMT: Negative for ear pain, hoarseness, nasal congestion, sinus pressure and sore throat. No headaches; tinnitus, drooling, or problem swallowing. Cardiovascular: Negative for chest pain, palpitations, diaphoresis, dyspnea and peripheral edema. ; No orthopnea, PND Respiratory: Negative for cough, hemoptysis, wheezing and stridor. No pleuritic chestpain. Gastrointestinal: Negative for nausea, vomiting, diarrhea, constipation, abdominal pain, melena, blood in stool, hematemesis, jaundice and rectal bleeding.    Genitourinary: Negative for frequency, dysuria, incontinence,flank pain and hematuria; Musculoskeletal: Negative for back pain and neck pain. Negative for swelling and trauma.;  Skin: . Negative for pruritus, rash, abrasions, bruising and skin lesion.; ulcerations Neuro: Negative for headache, lightheadedness and neck stiffness. Negative for weakness, altered level of consciousness , altered mental status, extremity weakness, burning feet, involuntary movement, seizure and syncope.  Psych: negative for anxiety, depression, insomnia,  tearfulness, panic attacks, hallucinations, paranoia, suicidal or homicidal ideation    Past Medical History  Diagnosis Date  . Coronary artery disease   . Hypertension   . Hyperlipemia   . Anxiety   . Depression   . Chronic pain   . Neuropathy ([place])   . Pernicious anemia   . Stroke ([place])   . BPH (benign prostatic hyperplasia)     Past Surgical History  Procedure Laterality Date  . Carotid endarterectomy    . Cardiac catheterization      Medications:  HOME MEDS: Prior to Admission medications   Medication Sig Start Date End Date Taking? Authorizing Provider  albuterol (PROVENTIL HFA;VENTOLIN HFA) 108 (90 BASE) MCG/ACT inhaler Inhale 2 puffs into the lungs every 6 (six) hours as needed for wheezing or shortness of breath.   Yes Historical Provider, MD  ALPRAZolam Duanne Moron) 1 MG tablet Take 1 mg by mouth 2 (two) times daily as needed for anxiety.   Yes Historical Provider, MD  aspirin EC 81 MG tablet Take 81 mg by mouth daily. Reported on 11/16/2015   Yes Historical Provider, MD  baclofen (LIORESAL) 20 MG tablet Take 20 mg by mouth 2 (two) times daily as needed for muscle spasms.   Yes Historical Provider, MD  clopidogrel (PLAVIX) 75 MG tablet Take 75 mg by mouth daily.   Yes Historical Provider, MD  Cyanocobalamin (B-12) 1000 MCG/ML KIT Inject 1,000 mcg as directed every 30 (thirty) days.   Yes Historical Provider, MD  DULoxetine (CYMBALTA) 60 MG capsule Take 60 mg by mouth daily.   Yes Historical Provider, MD  enalapril (VASOTEC) 10 MG tablet Take 10 mg by mouth daily.   Yes Historical Provider, MD  folic acid (  FOLVITE) 1 MG tablet Take 1 tablet (1 mg total) by mouth daily. 09/12/14  Yes Samuella Cota, MD  gabapentin (NEURONTIN) 800 MG tablet Take 1,200 mg by mouth 3 (three) times daily.    Yes Historical Provider, MD  HYDROcodone-acetaminophen (NORCO/VICODIN) 5-325 MG per tablet Take 1 tablet by mouth 3 (three) times daily as needed for moderate pain.   Yes Historical Provider,  MD  isosorbide mononitrate (ISMO,MONOKET) 20 MG tablet Take 10 mg by mouth daily.   Yes Historical Provider, MD  metoprolol succinate (TOPROL-XL) 50 MG 24 hr tablet Take 50 mg by mouth 2 (two) times daily. Take with or immediately following a meal.   Yes Historical Provider, MD  Multiple Vitamin (MULTIVITAMIN WITH MINERALS) TABS tablet Take 1 tablet by mouth daily. 09/12/14  Yes Samuella Cota, MD  rosuvastatin (CRESTOR) 40 MG tablet Take 40 mg by mouth daily.   Yes Historical Provider, MD  tamsulosin (FLOMAX) 0.4 MG CAPS capsule Take 0.4 mg by mouth daily.   Yes Historical Provider, MD  tiotropium (SPIRIVA) 18 MCG inhalation capsule Place 18 mcg into inhaler and inhale daily as needed (for shortness of breath).    Yes Historical Provider, MD  traMADol (ULTRAM) 50 MG tablet Take 50 mg by mouth 4 (four) times daily as needed for moderate pain (patient states started on 10/15/14 by primary care for knee pain; reports taking approximately once daily). 10/15/14  Yes Caren Macadam, MD  vitamin B-12 (CYANOCOBALAMIN) 1000 MCG tablet Take 1,000 mcg by mouth daily.   Yes Historical Provider, MD  ondansetron (ZOFRAN ODT) 4 MG disintegrating tablet Take 1 tablet (4 mg total) by mouth every 8 (eight) hours as needed for nausea. Patient not taking: Reported on 11/30/2015 11/16/15   Tanna Furry, MD     Allergies:  Allergies  Allergen Reactions  . Codeine Itching    Social History:   reports that he quit smoking 8 days ago. His smoking use included Cigarettes. He has a 40 pack-year smoking history. He does not have any smokeless tobacco history on file. He reports that he drinks about 7.2 oz of alcohol per week. His drug history is not on file.  Family History: Family History  Problem Relation Age of Onset  . Heart attack Father 77  . Congestive Heart Failure Mother 47  . Congestive Heart Failure Sister      Physical Exam: Filed Vitals:   11/30/15 1758 11/30/15 1807 11/30/15 2103 12/01/15 0547   BP:  128/69 96/56 112/52  Pulse:  54 54 58  Temp:  97.8 F (36.6 C) 97.8 F (36.6 C) 97.7 F (36.5 C)  TempSrc:  Oral Oral Oral  Resp:   20 19  Height: '5\' 3"'  (1.6 m)     Weight: 79.606 kg (175 lb 8 oz)   81.058 kg (178 lb 11.2 oz)  SpO2: 2% 96% 99% 95%   Blood pressure 112/52, pulse 58, temperature 97.7 F (36.5 C), temperature source Oral, resp. rate 19, height '5\' 3"'  (1.6 m), weight 81.058 kg (178 lb 11.2 oz), SpO2 95 %.  GEN:  Pleasant  patient lying in the stretcher in no acute distress; cooperative with exam. PSYCH:  alert and oriented x4; does not appear anxious or depressed; affect is appropriate. HEENT: Mucous membranes pink and anicteric; PERRLA; EOM intact; no cervical lymphadenopathy nor thyromegaly or carotid bruit; no JVD; There were no stridor. Neck is very supple. Breasts:: Not examined CHEST WALL: No tenderness CHEST: Normal respiration, clear to auscultation bilaterally.  HEART: Regular rate and rhythm.  There are no murmur, rub, or gallops.   BACK: No kyphosis or scoliosis; no CVA tenderness ABDOMEN: soft and non-tender; no masses, no organomegaly, normal abdominal bowel sounds; no pannus; no intertriginous candida. There is no rebound and no distention. Rectal Exam: Not done EXTREMITIES: No bone or joint deformity; age-appropriate arthropathy of the hands and knees; no edema; no ulcerations.  There is no calf tenderness. Genitalia: not examined PULSES: 2+ and symmetric SKIN: Normal hydration no rash or ulceration CNS: Cranial nerves 2-12 grossly intact no focal lateralizing neurologic deficit.  Speech is fluent; uvula elevated with phonation, facial symmetry and tongue midline. DTR are normal bilaterally, cerebella exam is intact, barbinski is negative and strengths are equaled bilaterally.  No sensory loss.   Labs on Admission:  Basic Metabolic Panel:  Recent Labs Lab 11/30/15 1154 12/01/15 0533  NA 140 141  K 2.5* 2.6*  CL 102 112*  CO2 27 22  GLUCOSE  119* 100*  BUN 31* 27*  CREATININE 3.13* 2.32*  CALCIUM 9.1 8.3*  MG  --  1.9   Liver Function Tests:  Recent Labs Lab 12/01/15 0533  AST 14*  ALT 9*  ALKPHOS 65  BILITOT 0.3  PROT 6.2*  ALBUMIN 3.1*    Recent Labs Lab 11/30/15 1154  LIPASE 72*   Recent Labs Lab 11/30/15 1154 12/01/15 0533  WBC 9.1 7.7  HGB 12.3* 11.3*  HCT 38.5* 35.2*  MCV 93.9 94.6  PLT 281 267   Cardiac Enzymes:  Recent Labs Lab 11/30/15 1154  TROPONINI <0.03    Radiological Exams on Admission: Ct Head Wo Contrast  11/30/2015  CLINICAL DATA:  Generalized weakness, confusion for 3 days EXAM: CT HEAD WITHOUT CONTRAST TECHNIQUE: Contiguous axial images were obtained from the base of the skull through the vertex without intravenous contrast. COMPARISON:  11/15/2005 FINDINGS: Brain: No intracranial hemorrhage, mass effect or midline shift. Stable atrophy and chronic periventricular white matter disease. No acute cortical infarction. Stable old infarct in left cerebellum stable small lacunar infarct in left basal ganglia. No mass lesion is noted on this unenhanced scan. Vascular: Atherosclerotic calcifications of carotid siphon are noted. Skull: No skull fracture is noted. No destructive bony lesions are seen. Sinuses/Orbits: Paranasal sinuses are unremarkable. No intraorbital hematoma. Other: None IMPRESSION: No acute intracranial abnormality. Stable atrophy and chronic white matter disease. Stable old infarct in left cerebellum. Stable small lacunar infarct left basal ganglia. No acute cortical infarction. Electronically Signed   By: Lahoma Crocker M.D.   On: 11/30/2015 15:25   US Renal  12/01/2015  CLINICAL DATA:  Acute renal failure, chronic kidney disease EXAM: RENAL / URINARY TRACT ULTRASOUND COMPLETE COMPARISON:  None. FINDINGS: Right Kidney: Length: 12.3 cm. Echogenicity mildly increased. No mass or hydronephrosis visualized. Left Kidney: Length: 11.5 cm. Echogenicity mildly increased. No mass or  hydronephrosis visualized. Bladder: Bladder is not evaluated well as it is not distended. IMPRESSION: Mild sonographic evidence of medical renal disease. Bladder not characterized. Electronically Signed   By: Skipper Cliche M.D.   On: 12/01/2015 09:06   Dg Chest Port 1 View  11/30/2015  CLINICAL DATA:  Weakness and fatigue. EXAM: PORTABLE CHEST 1 VIEW COMPARISON:  September 10, 2014 FINDINGS: Stable cardiomegaly. Mild atelectasis in the left lung base. The hila and mediastinum are normal. No pulmonary nodules or masses. No other infiltrates. IMPRESSION: Mild opacity in the left lung base is favored to represent atelectasis. Mild cardiomegaly. No other changes. Electronically Signed   By:  Dorise Bullion III M.D   On: 11/30/2015 13:40   Assessment/Plan Present on Admission:  . ARF (acute renal failure) ([place]) . HTN (hypertension) . CAD (coronary artery disease) . Acute renal failure (ARF) (HCC)  PLAN:  AKI:  Improving.  Unclear but could be pre-renal with baseline CKD III.  Will continue with IVF and avoid nephrotoxic drugs.   Renal US with medical renal disease.  Will follow renal Fx daily.   HTN:  Stable.  Not on ACE I or ARB.  CAD:  Stable.  Hypokalemia:  Being supplemented.    Other plans as per orders. Code Status:FULL CODE.    Orvan Falconer, MD.  FACP [facility] Pager [phone] 7pm to 7am.  12/01/2015, 10:12 AM

## 2015-12-01 NOTE — Clinical Social Work Note (Signed)
CSW received referral for medication assistance. CM notified. CSW will sign off, but can be reconsulted if needed.  Benay Pike, [place]

## 2015-12-01 NOTE — Consult Note (Signed)
   [facility] Inpatient Consult   12/01/2015  [place] 08-04-1955 [id number]   Spoke with patient and wife at bedside regarding restart of [facility] services. Patient does not want to sign up again to participate with [facility] at this time. Patient states he has [facility] program information and contact numbers if any future needs arise. Of note, [facility] Care Management services would not replace or interfere with any services that are arranged by inpatient case management or social work. For additional questions or referrals please contact:  Royetta Crochet. Laymond Purser, RN, BSN, [facility] Liaison [phone]

## 2015-12-02 LAB — BASIC METABOLIC PANEL
BUN: 21 mg/dL — ABNORMAL HIGH (ref 6–20)
CHLORIDE: 116 mmol/L — AB (ref 101–111)
CO2: 27 mmol/L (ref 22–32)
CREATININE: 2.13 mg/dL — AB (ref 0.61–1.24)
Calcium: 8.2 mg/dL — ABNORMAL LOW (ref 8.9–10.3)
GFR calc non Af Amer: 32 mL/min — ABNORMAL LOW (ref >60)
GFR, EST AFRICAN AMERICAN: 37 mL/min — AB (ref >60)
Glucose, Bld: 107 mg/dL — ABNORMAL HIGH (ref 65–99)
POTASSIUM: 3.3 mmol/L — AB (ref 3.5–5.1)
SODIUM: 144 mmol/L (ref 135–145)

## 2015-12-02 LAB — MAGNESIUM: MAGNESIUM: 1.7 mg/dL (ref 1.7–2.4)

## 2015-12-02 LAB — HEMOGLOBIN A1C
HEMOGLOBIN A1C: 5.9 % — AB (ref 4.8–5.6)
MEAN PLASMA GLUCOSE: 123 mg/dL

## 2015-12-02 MED ORDER — CETYLPYRIDINIUM CHLORIDE 0.05 % MT LIQD
7.0000 mL | Freq: Two times a day (BID) | OROMUCOSAL | Status: DC
  Administered 2015-12-02 – 2015-12-03 (×4): 7 mL via OROMUCOSAL

## 2015-12-02 NOTE — Care Management Important Message (Signed)
Important Message  Patient Details  Name: George Valdez MRN: [id number] Date of Birth: [birth year]   Medicare Important Message Given:  Yes    Sherald Barge, RN 12/02/2015, 1:29 PM

## 2015-12-02 NOTE — Progress Notes (Signed)
[facility] PROGRESS NOTE  DAMANI KELEMEN DDU:[id number] DOB: 1955/10/10    PCP:   Jana Half   HPI:   George Valdez is an 60 y.o. male with hx of CKD III, CAD, lives at home with his wife, admitted with AKI Cr of 3 (baseline 1.86) and hypokalemia. He has had no diarrhea, and has not been on diuretics. He was given IVF, and replacement of K, and his Cr has improved today. His K was still low. Mag was OK. He has no complaint today. During his admission, he had a renal US showing medical renal disease. For his anemia, his Hb has been stable. For his CAD, he was continued on his home meds, and had no acute events. His Cr has improved, and he is feeling better.    Rewiew of Systems:  Constitutional: Negative for malaise, fever and chills. No significant weight loss or weight gain Eyes: Negative for eye pain, redness and discharge, diplopia, visual changes, or flashes of light. ENMT: Negative for ear pain, hoarseness, nasal congestion, sinus pressure and sore throat. No headaches; tinnitus, drooling, or problem swallowing. Cardiovascular: Negative for chest pain, palpitations, diaphoresis, dyspnea and peripheral edema. ; No orthopnea, PND Respiratory: Negative for cough, hemoptysis, wheezing and stridor. No pleuritic chestpain. Gastrointestinal: Negative for nausea, vomiting, diarrhea, constipation, abdominal pain, melena, blood in stool, hematemesis, jaundice and rectal bleeding.    Genitourinary: Negative for frequency, dysuria, incontinence,flank pain and hematuria; Musculoskeletal: Negative for back pain and neck pain. Negative for swelling and trauma.;  Skin: . Negative for pruritus, rash, abrasions, bruising and skin lesion.; ulcerations Neuro: Negative for headache, lightheadedness and neck stiffness. Negative for weakness, altered level of consciousness , altered mental status, extremity weakness, burning feet, involuntary movement, seizure and  syncope.  Psych: negative for anxiety, depression, insomnia, tearfulness, panic attacks, hallucinations, paranoia, suicidal or homicidal ideation    Past Medical History  Diagnosis Date  . Coronary artery disease   . Hypertension   . Hyperlipemia   . Anxiety   . Depression   . Chronic pain   . Neuropathy ([place])   . Pernicious anemia   . Stroke ([place])   . BPH (benign prostatic hyperplasia)     Past Surgical History  Procedure Laterality Date  . Carotid endarterectomy    . Cardiac catheterization      Medications:  HOME MEDS: Prior to Admission medications   Medication Sig Start Date End Date Taking? Authorizing Provider  albuterol (PROVENTIL HFA;VENTOLIN HFA) 108 (90 BASE) MCG/ACT inhaler Inhale 2 puffs into the lungs every 6 (six) hours as needed for wheezing or shortness of breath.   Yes Historical Provider, MD  ALPRAZolam Duanne Moron) 1 MG tablet Take 1 mg by mouth 2 (two) times daily as needed for anxiety.   Yes Historical Provider, MD  aspirin EC 81 MG tablet Take 81 mg by mouth daily. Reported on 11/16/2015   Yes Historical Provider, MD  baclofen (LIORESAL) 20 MG tablet Take 20 mg by mouth 2 (two) times daily as needed for muscle spasms.   Yes Historical Provider, MD  clopidogrel (PLAVIX) 75 MG tablet Take 75 mg by mouth daily.   Yes Historical Provider, MD  Cyanocobalamin (B-12) 1000 MCG/ML KIT Inject 1,000 mcg as directed every 30 (thirty) days.   Yes Historical Provider, MD  DULoxetine (CYMBALTA) 60 MG capsule Take 60 mg by mouth daily.   Yes Historical Provider, MD  enalapril (VASOTEC) 10 MG tablet Take 10 mg by mouth daily.   Yes Historical  Provider, MD  folic acid (FOLVITE) 1 MG tablet Take 1 tablet (1 mg total) by mouth daily. 09/12/14  Yes Samuella Cota, MD  gabapentin (NEURONTIN) 800 MG tablet Take 1,200 mg by mouth 3 (three) times daily.    Yes Historical Provider, MD  HYDROcodone-acetaminophen (NORCO/VICODIN) 5-325 MG per tablet Take 1 tablet by mouth 3 (three) times  daily as needed for moderate pain.   Yes Historical Provider, MD  isosorbide mononitrate (ISMO,MONOKET) 20 MG tablet Take 10 mg by mouth daily.   Yes Historical Provider, MD  metoprolol succinate (TOPROL-XL) 50 MG 24 hr tablet Take 50 mg by mouth 2 (two) times daily. Take with or immediately following a meal.   Yes Historical Provider, MD  Multiple Vitamin (MULTIVITAMIN WITH MINERALS) TABS tablet Take 1 tablet by mouth daily. 09/12/14  Yes Samuella Cota, MD  rosuvastatin (CRESTOR) 40 MG tablet Take 40 mg by mouth daily.   Yes Historical Provider, MD  tamsulosin (FLOMAX) 0.4 MG CAPS capsule Take 0.4 mg by mouth daily.   Yes Historical Provider, MD  tiotropium (SPIRIVA) 18 MCG inhalation capsule Place 18 mcg into inhaler and inhale daily as needed (for shortness of breath).    Yes Historical Provider, MD  traMADol (ULTRAM) 50 MG tablet Take 50 mg by mouth 4 (four) times daily as needed for moderate pain (patient states started on 10/15/14 by primary care for knee pain; reports taking approximately once daily). 10/15/14  Yes Caren Macadam, MD  vitamin B-12 (CYANOCOBALAMIN) 1000 MCG tablet Take 1,000 mcg by mouth daily.   Yes Historical Provider, MD  ondansetron (ZOFRAN ODT) 4 MG disintegrating tablet Take 1 tablet (4 mg total) by mouth every 8 (eight) hours as needed for nausea. Patient not taking: Reported on 11/30/2015 11/16/15   Tanna Furry, MD     Allergies:  Allergies  Allergen Reactions  . Codeine Itching    Social History:   reports that he quit smoking 9 days ago. His smoking use included Cigarettes. He has a 40 pack-year smoking history. He does not have any smokeless tobacco history on file. He reports that he drinks about 7.2 oz of alcohol per week. His drug history is not on file.  Family History: Family History  Problem Relation Age of Onset  . Heart attack Father 45  . Congestive Heart Failure Mother 60  . Congestive Heart Failure Sister      Physical Exam: Filed Vitals:    12/01/15 2100 12/02/15 0600 12/02/15 0917 12/02/15 1510  BP: 99/55 126/68  123/59  Pulse: 59  58 51  Temp: 97.8 F (36.6 C) 98 F (36.7 C)  97.9 F (36.6 C)  TempSrc: Oral Oral  Oral  Resp: 20   20  Height:      Weight:  81.92 kg (180 lb 9.6 oz)    SpO2: 97% 97%  98%   Blood pressure 123/59, pulse 51, temperature 97.9 F (36.6 C), temperature source Oral, resp. rate 20, height '5\' 3"'  (1.6 m), weight 81.92 kg (180 lb 9.6 oz), SpO2 98 %.  GEN:  Pleasant  patient lying in the stretcher in no acute distress; cooperative with exam. PSYCH:  alert and oriented x4; does not appear anxious or depressed; affect is appropriate. HEENT: Mucous membranes pink and anicteric; PERRLA; EOM intact; no cervical lymphadenopathy nor thyromegaly or carotid bruit; no JVD; There were no stridor. Neck is very supple. Breasts:: Not examined CHEST WALL: No tenderness CHEST: Normal respiration, clear to auscultation bilaterally.  HEART: Regular  rate and rhythm.  There are no murmur, rub, or gallops.   BACK: No kyphosis or scoliosis; no CVA tenderness ABDOMEN: soft and non-tender; no masses, no organomegaly, normal abdominal bowel sounds; no pannus; no intertriginous candida. There is no rebound and no distention. Rectal Exam: Not done EXTREMITIES: No bone or joint deformity; age-appropriate arthropathy of the hands and knees; no edema; no ulcerations.  There is no calf tenderness. Genitalia: not examined PULSES: 2+ and symmetric SKIN: Normal hydration no rash or ulceration CNS: Cranial nerves 2-12 grossly intact no focal lateralizing neurologic deficit.  Speech is fluent; uvula elevated with phonation, facial symmetry and tongue midline. DTR are normal bilaterally, cerebella exam is intact, barbinski is negative and strengths are equaled bilaterally.  No sensory loss.   Labs on Admission:  Basic Metabolic Panel:  Recent Labs Lab 11/30/15 1154 12/01/15 0533 12/02/15 0649  NA 140 141 144  K 2.5* 2.6*  3.3*  CL 102 112* 116*  CO2 '27 22 27  ' GLUCOSE 119* 100* 107*  BUN 31* 27* 21*  CREATININE 3.13* 2.32* 2.13*  CALCIUM 9.1 8.3* 8.2*  MG  --  1.9 1.7   Liver Function Tests:  Recent Labs Lab 12/01/15 0533  AST 14*  ALT 9*  ALKPHOS 65  BILITOT 0.3  PROT 6.2*  ALBUMIN 3.1*    Recent Labs Lab 11/30/15 1154  LIPASE 72*   CBC:  Recent Labs Lab 11/30/15 1154 12/01/15 0533  WBC 9.1 7.7  HGB 12.3* 11.3*  HCT 38.5* 35.2*  MCV 93.9 94.6  PLT 281 267   Cardiac Enzymes:  Recent Labs Lab 11/30/15 1154  TROPONINI <0.03    Radiological Exams on Admission: US Renal  12/01/2015  CLINICAL DATA:  Acute renal failure, chronic kidney disease EXAM: RENAL / URINARY TRACT ULTRASOUND COMPLETE COMPARISON:  None. FINDINGS: Right Kidney: Length: 12.3 cm. Echogenicity mildly increased. No mass or hydronephrosis visualized. Left Kidney: Length: 11.5 cm. Echogenicity mildly increased. No mass or hydronephrosis visualized. Bladder: Bladder is not evaluated well as it is not distended. IMPRESSION: Mild sonographic evidence of medical renal disease. Bladder not characterized. Electronically Signed   By: Skipper Cliche M.D.   On: 12/01/2015 09:06   Assessment/Plan Present on Admission:  . ARF (acute renal failure) ([place]) . HTN (hypertension) . CAD (coronary artery disease) . Acute renal failure (ARF) (HCC)  PLAN:   AKI: Improving. Unclear but could be pre-renal with baseline CKD III.  Cr is 2.1 today, with previous Cr 1.9.  Will continue with IVF and avoid nephrotoxic drugs. Renal US with medical renal disease. Will follow renal Fx daily.  If continue to improve, or at least stable, will plan d/c him tomorrow.   HTN: Stable. Not on ACE I or ARB.  CAD: Stable.  Hypokalemia: Being supplemented.     Other plans as per orders. Code Status: FULL Haskel Khan, MD.  FACP [facility] Pager [phone] 7pm to 7am.  12/02/2015, 4:17 PM

## 2015-12-03 LAB — BASIC METABOLIC PANEL
ANION GAP: 3 — AB (ref 5–15)
BUN: 19 mg/dL (ref 6–20)
CALCIUM: 8 mg/dL — AB (ref 8.9–10.3)
CHLORIDE: 116 mmol/L — AB (ref 101–111)
CO2: 23 mmol/L (ref 22–32)
Creatinine, Ser: 1.7 mg/dL — ABNORMAL HIGH (ref 0.61–1.24)
GFR calc Af Amer: 48 mL/min — ABNORMAL LOW (ref >60)
GFR calc non Af Amer: 42 mL/min — ABNORMAL LOW (ref >60)
GLUCOSE: 105 mg/dL — AB (ref 65–99)
POTASSIUM: 3.2 mmol/L — AB (ref 3.5–5.1)
Sodium: 142 mmol/L (ref 135–145)

## 2015-12-03 MED ORDER — GABAPENTIN 300 MG PO CAPS: 300.0000 mg | ORAL_CAPSULE | Freq: Three times a day (TID) | ORAL | Status: DC

## 2015-12-03 MED ORDER — ROSUVASTATIN CALCIUM 10 MG PO TABS: 10.0000 mg | ORAL_TABLET | Freq: Every day | ORAL | Status: DC

## 2015-12-03 MED ORDER — THIAMINE HCL 100 MG PO TABS: 100.0000 mg | ORAL_TABLET | Freq: Every day | ORAL | Status: AC

## 2015-12-03 NOTE — Care Management Note (Signed)
Case Management Note  Patient Details  Name: HEZZIE WILCKEN MRN: [id number] Date of Birth: 12-31-1955  Expected Discharge Date:     12/03/2015             Expected Discharge Plan:  Home/Self Care  Discharge planning Services  CM Consult  Post Acute Care Choice:  NA Choice offered to:  NA  DME Arranged:    DME Agency:     HH Arranged:    [place] Agency:     Status of Service:  Completed, signed off  Medicare Important Message Given:  Yes Date Medicare IM Given:    Medicare IM give by:    Date Additional Medicare IM Given:    Additional Medicare Important Message give by:     If discussed at [place] of Stay Meetings, dates discussed:    Additional Comments: Discharging home today. No CM needs.   Sherald Barge, RN 12/03/2015, 1:59 PM

## 2015-12-03 NOTE — Discharge Summary (Signed)
Physician Discharge Summary  George Valdez PRF:[id number] DOB: 30-Sep-1955 DOA: 11/30/2015  PCP: Jana Half  Admit date: 11/30/2015 Discharge date: 12/03/2015  Time spent: 35 minutes  Recommendations for Outpatient Follow-up:  1. Follow up with PCP in one week.    Discharge Diagnoses:  Principal Problem:   ARF (acute renal failure) (HCC) Active Problems:   HTN (hypertension)   CAD (coronary artery disease)   Anemia   Hypokalemia   Acute renal failure (ARF) ([place])   Discharge Condition: improved.  Cr is back to baseline.  No weakness   Diet recommendation: Regular diet, heart healthy recommneded.   Filed Weights   12/01/15 0547 12/02/15 0600 12/03/15 0539  Weight: 81.058 kg (178 lb 11.2 oz) 81.92 kg (180 lb 9.6 oz) 81.32 kg (179 lb 4.5 oz)    History of present illness:  Patient was admitted for weakness, found to have AKI on CKD, and was admitted on November 30, 2015, by Dr Maudie Mercury.  As per his H and P:  " George Valdez is a 60 y.o. male, CAD, w ckd stage3, apparently w prior creatiine 1.86 2 weeks ago, presented with c/o shaking and tremor, and generalized weakness. Slight dyspnea with exertion. Denies cp, palp, focal neurological signs. Pt has nocturia. 2-3 x per nite. Stream weak. Last urinated in the ED. Some otc ibuprofen use. Pt denies otherwise any change in medication.   Pt will be admitted ARF, Hypokalemia.    Hospital Course: Patient was admitted into the hospital, and he had a renal US of his kidney, showing no hydronephrosis, suggestion of a "medical renal disease" kidneys.  He was given IVF, and his hypokalemia was supplemented.  His Mag level was checked, and it was normal.  His Cr slowly improved to his baseline, and he felt well.  For his anemia, his Hb remained stable.  There was no sign of bleeding.  For his CAD, he was continued on his home meds.  He is anxious to get home, and he is felt to be stable for discharge.  Nephrotoxic drugs will  be avoided.  His wife was updated with plan and she agreed.  He will be discharged to home today.  He will follow up with his PCP in one week. His medication was adjusted with lower dose of crestor and neurontin.  Thank you and Good Day.    Consultations:  None.   Discharge Exam: Filed Vitals:   12/02/15 2137 12/03/15 0539  BP:  137/66  Pulse: 65 64  Temp:  98.2 F (36.8 C)  Resp:  20   Discharge Instructions   Discharge Instructions    Diet - low sodium heart healthy    Complete by:  As directed      Discharge instructions    Complete by:  As directed   Take your medication as directed.  Follow up with your doctor next week.     Increase activity slowly    Complete by:  As directed           Current Discharge Medication List    START taking these medications   Details  gabapentin (NEURONTIN) 300 MG capsule Take 1 capsule (300 mg total) by mouth 3 (three) times daily. Qty: 90 capsule, Refills: 3    thiamine 100 MG tablet Take 1 tablet (100 mg total) by mouth daily. Qty: 30 tablet, Refills: 0      CONTINUE these medications which have CHANGED   Details  rosuvastatin (CRESTOR) 10 MG tablet Take 1  tablet (10 mg total) by mouth daily at 6 PM. Qty: 30 tablet, Refills: 3      CONTINUE these medications which have NOT CHANGED   Details  albuterol (PROVENTIL HFA;VENTOLIN HFA) 108 (90 BASE) MCG/ACT inhaler Inhale 2 puffs into the lungs every 6 (six) hours as needed for wheezing or shortness of breath.    ALPRAZolam (XANAX) 1 MG tablet Take 1 mg by mouth 2 (two) times daily as needed for anxiety.    aspirin EC 81 MG tablet Take 81 mg by mouth daily. Reported on 11/16/2015    baclofen (LIORESAL) 20 MG tablet Take 20 mg by mouth 2 (two) times daily as needed for muscle spasms.    clopidogrel (PLAVIX) 75 MG tablet Take 75 mg by mouth daily.    Cyanocobalamin (B-12) 1000 MCG/ML KIT Inject 1,000 mcg as directed every 30 (thirty) days.    DULoxetine (CYMBALTA) 60 MG capsule  Take 60 mg by mouth daily.    enalapril (VASOTEC) 10 MG tablet Take 10 mg by mouth daily.    folic acid (FOLVITE) 1 MG tablet Take 1 tablet (1 mg total) by mouth daily.    HYDROcodone-acetaminophen (NORCO/VICODIN) 5-325 MG per tablet Take 1 tablet by mouth 3 (three) times daily as needed for moderate pain.    isosorbide mononitrate (ISMO,MONOKET) 20 MG tablet Take 10 mg by mouth daily.    metoprolol succinate (TOPROL-XL) 50 MG 24 hr tablet Take 50 mg by mouth 2 (two) times daily. Take with or immediately following a meal.    Multiple Vitamin (MULTIVITAMIN WITH MINERALS) TABS tablet Take 1 tablet by mouth daily.    tamsulosin (FLOMAX) 0.4 MG CAPS capsule Take 0.4 mg by mouth daily.    tiotropium (SPIRIVA) 18 MCG inhalation capsule Place 18 mcg into inhaler and inhale daily as needed (for shortness of breath).       STOP taking these medications     gabapentin (NEURONTIN) 800 MG tablet      traMADol (ULTRAM) 50 MG tablet      vitamin B-12 (CYANOCOBALAMIN) 1000 MCG tablet      ondansetron (ZOFRAN ODT) 4 MG disintegrating tablet        Allergies  Allergen Reactions  . Codeine Itching      The results of significant diagnostics from this hospitalization (including imaging, microbiology, ancillary and laboratory) are listed below for reference.    Significant Diagnostic Studies: Ct Head Wo Contrast  11/30/2015  CLINICAL DATA:  Generalized weakness, confusion for 3 days EXAM: CT HEAD WITHOUT CONTRAST TECHNIQUE: Contiguous axial images were obtained from the base of the skull through the vertex without intravenous contrast. COMPARISON:  11/15/2005 FINDINGS: Brain: No intracranial hemorrhage, mass effect or midline shift. Stable atrophy and chronic periventricular white matter disease. No acute cortical infarction. Stable old infarct in left cerebellum stable small lacunar infarct in left basal ganglia. No mass lesion is noted on this unenhanced scan. Vascular: Atherosclerotic  calcifications of carotid siphon are noted. Skull: No skull fracture is noted. No destructive bony lesions are seen. Sinuses/Orbits: Paranasal sinuses are unremarkable. No intraorbital hematoma. Other: None IMPRESSION: No acute intracranial abnormality. Stable atrophy and chronic white matter disease. Stable old infarct in left cerebellum. Stable small lacunar infarct left basal ganglia. No acute cortical infarction. Electronically Signed   By: Lahoma Crocker M.D.   On: 11/30/2015 15:25   Ct Head Wo Contrast  11/16/2015  CLINICAL DATA:  Weakness. EXAM: CT HEAD WITHOUT CONTRAST TECHNIQUE: Contiguous axial images were obtained from the base  of the skull through the vertex without intravenous contrast. COMPARISON:  09/10/2014 FINDINGS: Mild atrophy unchanged. Chronic left PICA infarct. Chronic ischemia left basal ganglia unchanged. Negative for acute infarct. Negative for intracranial hemorrhage. Negative for mass or edema. No shift of the midline structures. Negative calvarium. IMPRESSION: Atrophy and chronic ischemic changes are stable. No acute abnormality. No change from the prior CT. Electronically Signed   By: Franchot Gallo M.D.   On: 11/16/2015 12:57   US Renal  12/01/2015  CLINICAL DATA:  Acute renal failure, chronic kidney disease EXAM: RENAL / URINARY TRACT ULTRASOUND COMPLETE COMPARISON:  None. FINDINGS: Right Kidney: Length: 12.3 cm. Echogenicity mildly increased. No mass or hydronephrosis visualized. Left Kidney: Length: 11.5 cm. Echogenicity mildly increased. No mass or hydronephrosis visualized. Bladder: Bladder is not evaluated well as it is not distended. IMPRESSION: Mild sonographic evidence of medical renal disease. Bladder not characterized. Electronically Signed   By: Skipper Cliche M.D.   On: 12/01/2015 09:06   Dg Chest Port 1 View  11/30/2015  CLINICAL DATA:  Weakness and fatigue. EXAM: PORTABLE CHEST 1 VIEW COMPARISON:  September 10, 2014 FINDINGS: Stable cardiomegaly. Mild atelectasis in the  left lung base. The hila and mediastinum are normal. No pulmonary nodules or masses. No other infiltrates. IMPRESSION: Mild opacity in the left lung base is favored to represent atelectasis. Mild cardiomegaly. No other changes. Electronically Signed   By: Dorise Bullion III M.D   On: 11/30/2015 13:40    Labs: Basic Metabolic Panel:  Recent Labs Lab 11/30/15 1154 12/01/15 0533 12/02/15 0649 12/03/15 0425  NA 140 141 144 142  K 2.5* 2.6* 3.3* 3.2*  CL 102 112* 116* 116*  CO2 '27 22 27 23  ' GLUCOSE 119* 100* 107* 105*  BUN 31* 27* 21* 19  CREATININE 3.13* 2.32* 2.13* 1.70*  CALCIUM 9.1 8.3* 8.2* 8.0*  MG  --  1.9 1.7  --    Liver Function Tests:  Recent Labs Lab 12/01/15 0533  AST 14*  ALT 9*  ALKPHOS 65  BILITOT 0.3  PROT 6.2*  ALBUMIN 3.1*    Recent Labs Lab 11/30/15 1154  LIPASE 72*   CBC:  Recent Labs Lab 11/30/15 1154 12/01/15 0533  WBC 9.1 7.7  HGB 12.3* 11.3*  HCT 38.5* 35.2*  MCV 93.9 94.6  PLT 281 267   Cardiac Enzymes:  Recent Labs Lab 11/30/15 1154  TROPONINI <0.03    Signed:  Novaleigh Kohlman MD. Rosalita Chessman.  [facility] 12/03/2015, 1:50 PM

## 2015-12-03 NOTE — Progress Notes (Signed)
Patient discharged with instructions given on medications,and follow up visits,patient,and family verbalized understanding. Prescriptions sent with patient. Accompanied by staff to an awaiting vehicle. 

## 2015-12-09 DIAGNOSIS — E876 Hypokalemia: Secondary | ICD-10-CM | POA: Diagnosis not present

## 2015-12-09 DIAGNOSIS — Z1389 Encounter for screening for other disorder: Secondary | ICD-10-CM | POA: Diagnosis not present

## 2015-12-09 DIAGNOSIS — F419 Anxiety disorder, unspecified: Secondary | ICD-10-CM | POA: Diagnosis not present

## 2015-12-09 DIAGNOSIS — N179 Acute kidney failure, unspecified: Secondary | ICD-10-CM | POA: Diagnosis not present

## 2015-12-09 DIAGNOSIS — Z6829 Body mass index (BMI) 29.0-29.9, adult: Secondary | ICD-10-CM | POA: Diagnosis not present

## 2015-12-09 DIAGNOSIS — N178 Other acute kidney failure: Secondary | ICD-10-CM | POA: Diagnosis not present

## 2016-01-08 DIAGNOSIS — E782 Mixed hyperlipidemia: Secondary | ICD-10-CM | POA: Diagnosis not present

## 2016-01-08 DIAGNOSIS — N401 Enlarged prostate with lower urinary tract symptoms: Secondary | ICD-10-CM | POA: Diagnosis not present

## 2016-01-08 DIAGNOSIS — I10 Essential (primary) hypertension: Secondary | ICD-10-CM | POA: Diagnosis not present

## 2016-01-08 DIAGNOSIS — Z6829 Body mass index (BMI) 29.0-29.9, adult: Secondary | ICD-10-CM | POA: Diagnosis not present

## 2016-03-29 DIAGNOSIS — Z1389 Encounter for screening for other disorder: Secondary | ICD-10-CM | POA: Diagnosis not present

## 2016-03-29 DIAGNOSIS — Z79891 Long term (current) use of opiate analgesic: Secondary | ICD-10-CM | POA: Diagnosis not present

## 2016-04-22 DIAGNOSIS — E663 Overweight: Secondary | ICD-10-CM | POA: Diagnosis not present

## 2016-04-22 DIAGNOSIS — G894 Chronic pain syndrome: Secondary | ICD-10-CM | POA: Diagnosis not present

## 2016-04-22 DIAGNOSIS — Z23 Encounter for immunization: Secondary | ICD-10-CM | POA: Diagnosis not present

## 2016-04-22 DIAGNOSIS — F419 Anxiety disorder, unspecified: Secondary | ICD-10-CM | POA: Diagnosis not present

## 2016-04-22 DIAGNOSIS — Z0000 Encounter for general adult medical examination without abnormal findings: Secondary | ICD-10-CM | POA: Diagnosis not present

## 2016-04-22 DIAGNOSIS — Z6829 Body mass index (BMI) 29.0-29.9, adult: Secondary | ICD-10-CM | POA: Diagnosis not present

## 2016-05-23 DIAGNOSIS — E782 Mixed hyperlipidemia: Secondary | ICD-10-CM | POA: Diagnosis not present

## 2016-05-23 DIAGNOSIS — G894 Chronic pain syndrome: Secondary | ICD-10-CM | POA: Diagnosis not present

## 2016-05-23 DIAGNOSIS — F419 Anxiety disorder, unspecified: Secondary | ICD-10-CM | POA: Diagnosis not present

## 2016-05-23 DIAGNOSIS — I10 Essential (primary) hypertension: Secondary | ICD-10-CM | POA: Diagnosis not present

## 2016-05-23 DIAGNOSIS — E663 Overweight: Secondary | ICD-10-CM | POA: Diagnosis not present

## 2016-05-23 DIAGNOSIS — Z6829 Body mass index (BMI) 29.0-29.9, adult: Secondary | ICD-10-CM | POA: Diagnosis not present

## 2016-05-23 DIAGNOSIS — E669 Obesity, unspecified: Secondary | ICD-10-CM | POA: Diagnosis not present

## 2016-06-22 DIAGNOSIS — I10 Essential (primary) hypertension: Secondary | ICD-10-CM | POA: Diagnosis not present

## 2016-06-22 DIAGNOSIS — E663 Overweight: Secondary | ICD-10-CM | POA: Diagnosis not present

## 2016-06-22 DIAGNOSIS — Z1389 Encounter for screening for other disorder: Secondary | ICD-10-CM | POA: Diagnosis not present

## 2016-06-22 DIAGNOSIS — F419 Anxiety disorder, unspecified: Secondary | ICD-10-CM | POA: Diagnosis not present

## 2016-06-22 DIAGNOSIS — G894 Chronic pain syndrome: Secondary | ICD-10-CM | POA: Diagnosis not present

## 2016-06-22 DIAGNOSIS — Z6827 Body mass index (BMI) 27.0-27.9, adult: Secondary | ICD-10-CM | POA: Diagnosis not present

## 2016-06-22 DIAGNOSIS — F329 Major depressive disorder, single episode, unspecified: Secondary | ICD-10-CM | POA: Diagnosis not present

## 2016-06-22 DIAGNOSIS — E669 Obesity, unspecified: Secondary | ICD-10-CM | POA: Diagnosis not present

## 2016-07-22 ENCOUNTER — Inpatient Hospital Stay (HOSPITAL_COMMUNITY)
Admission: EM | Admit: 2016-07-22 | Discharge: 2016-08-04 | DRG: 896 | Disposition: A | Discharge status: Skilled Nursing Facility | Payer: Medicare Other | Attending: Internal Medicine | Admitting: Internal Medicine

## 2016-07-22 ENCOUNTER — Emergency Department (HOSPITAL_COMMUNITY): Payer: Medicare Other

## 2016-07-22 ENCOUNTER — Encounter (HOSPITAL_COMMUNITY): Payer: Self-pay | Admitting: Emergency Medicine

## 2016-07-22 DIAGNOSIS — Z885 Allergy status to narcotic agent status: Secondary | ICD-10-CM

## 2016-07-22 DIAGNOSIS — Z7902 Long term (current) use of antithrombotics/antiplatelets: Secondary | ICD-10-CM

## 2016-07-22 DIAGNOSIS — I129 Hypertensive chronic kidney disease with stage 1 through stage 4 chronic kidney disease, or unspecified chronic kidney disease: Secondary | ICD-10-CM | POA: Diagnosis present

## 2016-07-22 DIAGNOSIS — N184 Chronic kidney disease, stage 4 (severe): Secondary | ICD-10-CM | POA: Diagnosis present

## 2016-07-22 DIAGNOSIS — J449 Chronic obstructive pulmonary disease, unspecified: Secondary | ICD-10-CM | POA: Diagnosis present

## 2016-07-22 DIAGNOSIS — G9340 Encephalopathy, unspecified: Secondary | ICD-10-CM | POA: Diagnosis not present

## 2016-07-22 DIAGNOSIS — E860 Dehydration: Secondary | ICD-10-CM | POA: Diagnosis present

## 2016-07-22 DIAGNOSIS — I69354 Hemiplegia and hemiparesis following cerebral infarction affecting left non-dominant side: Secondary | ICD-10-CM | POA: Diagnosis not present

## 2016-07-22 DIAGNOSIS — I739 Peripheral vascular disease, unspecified: Secondary | ICD-10-CM | POA: Diagnosis present

## 2016-07-22 DIAGNOSIS — N179 Acute kidney failure, unspecified: Secondary | ICD-10-CM | POA: Diagnosis not present

## 2016-07-22 DIAGNOSIS — I959 Hypotension, unspecified: Secondary | ICD-10-CM | POA: Diagnosis present

## 2016-07-22 DIAGNOSIS — Z79899 Other long term (current) drug therapy: Secondary | ICD-10-CM

## 2016-07-22 DIAGNOSIS — R197 Diarrhea, unspecified: Secondary | ICD-10-CM | POA: Diagnosis not present

## 2016-07-22 DIAGNOSIS — R531 Weakness: Secondary | ICD-10-CM

## 2016-07-22 DIAGNOSIS — R001 Bradycardia, unspecified: Secondary | ICD-10-CM | POA: Diagnosis present

## 2016-07-22 DIAGNOSIS — G629 Polyneuropathy, unspecified: Secondary | ICD-10-CM | POA: Diagnosis present

## 2016-07-22 DIAGNOSIS — R296 Repeated falls: Secondary | ICD-10-CM | POA: Diagnosis not present

## 2016-07-22 DIAGNOSIS — E43 Unspecified severe protein-calorie malnutrition: Secondary | ICD-10-CM | POA: Diagnosis not present

## 2016-07-22 DIAGNOSIS — Z6825 Body mass index (BMI) 25.0-25.9, adult: Secondary | ICD-10-CM

## 2016-07-22 DIAGNOSIS — F10231 Alcohol dependence with withdrawal delirium: Principal | ICD-10-CM | POA: Diagnosis present

## 2016-07-22 DIAGNOSIS — Z23 Encounter for immunization: Secondary | ICD-10-CM

## 2016-07-22 DIAGNOSIS — F1010 Alcohol abuse, uncomplicated: Secondary | ICD-10-CM | POA: Diagnosis not present

## 2016-07-22 DIAGNOSIS — I639 Cerebral infarction, unspecified: Secondary | ICD-10-CM | POA: Diagnosis present

## 2016-07-22 DIAGNOSIS — R451 Restlessness and agitation: Secondary | ICD-10-CM | POA: Diagnosis not present

## 2016-07-22 DIAGNOSIS — F329 Major depressive disorder, single episode, unspecified: Secondary | ICD-10-CM | POA: Diagnosis present

## 2016-07-22 DIAGNOSIS — D510 Vitamin B12 deficiency anemia due to intrinsic factor deficiency: Secondary | ICD-10-CM | POA: Diagnosis present

## 2016-07-22 DIAGNOSIS — G9341 Metabolic encephalopathy: Secondary | ICD-10-CM | POA: Diagnosis present

## 2016-07-22 DIAGNOSIS — I2510 Atherosclerotic heart disease of native coronary artery without angina pectoris: Secondary | ICD-10-CM | POA: Diagnosis present

## 2016-07-22 DIAGNOSIS — N400 Enlarged prostate without lower urinary tract symptoms: Secondary | ICD-10-CM | POA: Diagnosis present

## 2016-07-22 DIAGNOSIS — S0031XA Abrasion of nose, initial encounter: Secondary | ICD-10-CM | POA: Diagnosis present

## 2016-07-22 DIAGNOSIS — R55 Syncope and collapse: Secondary | ICD-10-CM | POA: Diagnosis not present

## 2016-07-22 DIAGNOSIS — M6281 Muscle weakness (generalized): Secondary | ICD-10-CM | POA: Diagnosis not present

## 2016-07-22 DIAGNOSIS — Z8249 Family history of ischemic heart disease and other diseases of the circulatory system: Secondary | ICD-10-CM

## 2016-07-22 DIAGNOSIS — R279 Unspecified lack of coordination: Secondary | ICD-10-CM | POA: Diagnosis not present

## 2016-07-22 DIAGNOSIS — E876 Hypokalemia: Secondary | ICD-10-CM | POA: Diagnosis not present

## 2016-07-22 DIAGNOSIS — R061 Stridor: Secondary | ICD-10-CM | POA: Diagnosis not present

## 2016-07-22 DIAGNOSIS — F17210 Nicotine dependence, cigarettes, uncomplicated: Secondary | ICD-10-CM | POA: Diagnosis present

## 2016-07-22 DIAGNOSIS — W1830XA Fall on same level, unspecified, initial encounter: Secondary | ICD-10-CM | POA: Diagnosis present

## 2016-07-22 DIAGNOSIS — F419 Anxiety disorder, unspecified: Secondary | ICD-10-CM | POA: Diagnosis present

## 2016-07-22 DIAGNOSIS — E785 Hyperlipidemia, unspecified: Secondary | ICD-10-CM | POA: Diagnosis present

## 2016-07-22 DIAGNOSIS — Z7982 Long term (current) use of aspirin: Secondary | ICD-10-CM

## 2016-07-22 DIAGNOSIS — E875 Hyperkalemia: Secondary | ICD-10-CM | POA: Diagnosis present

## 2016-07-22 DIAGNOSIS — S199XXA Unspecified injury of neck, initial encounter: Secondary | ICD-10-CM | POA: Diagnosis not present

## 2016-07-22 DIAGNOSIS — W19XXXA Unspecified fall, initial encounter: Secondary | ICD-10-CM | POA: Diagnosis present

## 2016-07-22 DIAGNOSIS — Z7401 Bed confinement status: Secondary | ICD-10-CM | POA: Diagnosis not present

## 2016-07-22 DIAGNOSIS — G8929 Other chronic pain: Secondary | ICD-10-CM | POA: Diagnosis present

## 2016-07-22 DIAGNOSIS — S0990XA Unspecified injury of head, initial encounter: Secondary | ICD-10-CM | POA: Diagnosis not present

## 2016-07-22 DIAGNOSIS — S098XXA Other specified injuries of head, initial encounter: Secondary | ICD-10-CM | POA: Diagnosis not present

## 2016-07-22 DIAGNOSIS — S0181XA Laceration without foreign body of other part of head, initial encounter: Secondary | ICD-10-CM | POA: Diagnosis not present

## 2016-07-22 LAB — CBC WITH DIFFERENTIAL/PLATELET
Basophils Absolute: 0 10*3/uL (ref 0.0–0.1)
Basophils Relative: 0 %
Eosinophils Absolute: 0.1 10*3/uL (ref 0.0–0.7)
Eosinophils Relative: 1 %
HEMATOCRIT: 40.2 % (ref 39.0–52.0)
HEMOGLOBIN: 13.8 g/dL (ref 13.0–17.0)
LYMPHS ABS: 1.1 10*3/uL (ref 0.7–4.0)
LYMPHS PCT: 9 %
MCH: 31.5 pg (ref 26.0–34.0)
MCHC: 34.3 g/dL (ref 30.0–36.0)
MCV: 91.8 fL (ref 78.0–100.0)
MONO ABS: 0.9 10*3/uL (ref 0.1–1.0)
MONOS PCT: 8 %
NEUTROS ABS: 9.7 10*3/uL — AB (ref 1.7–7.7)
NEUTROS PCT: 82 %
Platelets: 283 10*3/uL (ref 150–400)
RBC: 4.38 MIL/uL (ref 4.22–5.81)
RDW: 15.7 % — ABNORMAL HIGH (ref 11.5–15.5)
WBC: 11.9 10*3/uL — ABNORMAL HIGH (ref 4.0–10.5)

## 2016-07-22 LAB — URINALYSIS, ROUTINE W REFLEX MICROSCOPIC
Bacteria, UA: NONE SEEN
Bilirubin Urine: NEGATIVE
Glucose, UA: NEGATIVE mg/dL
Ketones, ur: NEGATIVE mg/dL
Leukocytes, UA: NEGATIVE
Nitrite: NEGATIVE
Protein, ur: 100 mg/dL — AB
SPECIFIC GRAVITY, URINE: 1.005 (ref 1.005–1.030)
pH: 7 (ref 5.0–8.0)

## 2016-07-22 LAB — COMPREHENSIVE METABOLIC PANEL
ALBUMIN: 2.9 g/dL — AB (ref 3.5–5.0)
ALK PHOS: 81 U/L (ref 38–126)
ALT: 17 U/L (ref 17–63)
ANION GAP: 8 (ref 5–15)
AST: 25 U/L (ref 15–41)
BILIRUBIN TOTAL: 0.6 mg/dL (ref 0.3–1.2)
BUN: 17 mg/dL (ref 6–20)
CALCIUM: 8.9 mg/dL (ref 8.9–10.3)
CO2: 26 mmol/L (ref 22–32)
Chloride: 110 mmol/L (ref 101–111)
Creatinine, Ser: 2.03 mg/dL — ABNORMAL HIGH (ref 0.61–1.24)
GFR calc Af Amer: 39 mL/min — ABNORMAL LOW (ref >60)
GFR, EST NON AFRICAN AMERICAN: 34 mL/min — AB (ref >60)
GLUCOSE: 105 mg/dL — AB (ref 65–99)
Potassium: 2.2 mmol/L — CL (ref 3.5–5.1)
Sodium: 144 mmol/L (ref 135–145)
TOTAL PROTEIN: 6.3 g/dL — AB (ref 6.5–8.1)

## 2016-07-22 LAB — RAPID URINE DRUG SCREEN, HOSP PERFORMED
AMPHETAMINES: NOT DETECTED
Barbiturates: NOT DETECTED
Benzodiazepines: POSITIVE — AB
COCAINE: NOT DETECTED
OPIATES: NOT DETECTED
TETRAHYDROCANNABINOL: NOT DETECTED

## 2016-07-22 LAB — TROPONIN I
TROPONIN I: 0.04 ng/mL — AB (ref <0.03)
TROPONIN I: 0.04 ng/mL — AB (ref <0.03)

## 2016-07-22 LAB — ETHANOL

## 2016-07-22 LAB — AMMONIA: Ammonia: 24 umol/L (ref 9–35)

## 2016-07-22 LAB — LACTIC ACID, PLASMA: Lactic Acid, Venous: 0.8 mmol/L (ref 0.5–1.9)

## 2016-07-22 MED ORDER — ASPIRIN EC 81 MG PO TBEC
81.0000 mg | DELAYED_RELEASE_TABLET | Freq: Every day | ORAL | Status: DC
  Administered 2016-07-23 – 2016-08-04 (×12): 81 mg via ORAL
  Filled 2016-07-22 (×11): qty 1

## 2016-07-22 MED ORDER — LORAZEPAM 2 MG/ML IJ SOLN
0.0000 mg | Freq: Four times a day (QID) | INTRAMUSCULAR | Status: AC
  Filled 2016-07-22: qty 2

## 2016-07-22 MED ORDER — DULOXETINE HCL 60 MG PO CPEP
60.0000 mg | ORAL_CAPSULE | Freq: Every day | ORAL | Status: DC
  Administered 2016-07-23 – 2016-08-04 (×11): 60 mg via ORAL
  Filled 2016-07-22 (×10): qty 1

## 2016-07-22 MED ORDER — FOLIC ACID 1 MG PO TABS
1.0000 mg | ORAL_TABLET | Freq: Every day | ORAL | Status: DC
  Administered 2016-07-23 – 2016-08-04 (×11): 1 mg via ORAL
  Filled 2016-07-22 (×10): qty 1

## 2016-07-22 MED ORDER — THIAMINE HCL 100 MG/ML IJ SOLN
100.0000 mg | Freq: Every day | INTRAMUSCULAR | Status: DC
  Administered 2016-07-25 – 2016-07-28 (×3): 100 mg via INTRAVENOUS
  Filled 2016-07-22 (×3): qty 2

## 2016-07-22 MED ORDER — SODIUM CHLORIDE 0.9 % IV SOLN
1000.0000 mL | Freq: Once | INTRAVENOUS | Status: AC
  Administered 2016-07-22: 1000 mL via INTRAVENOUS

## 2016-07-22 MED ORDER — LORAZEPAM 1 MG PO TABS
1.0000 mg | ORAL_TABLET | Freq: Four times a day (QID) | ORAL | Status: DC | PRN
  Administered 2016-07-23 – 2016-07-24 (×3): 1 mg via ORAL
  Filled 2016-07-22 (×3): qty 1

## 2016-07-22 MED ORDER — POTASSIUM CHLORIDE 10 MEQ/100ML IV SOLN
10.0000 meq | INTRAVENOUS | Status: DC
Start: 2016-07-23 — End: 2016-07-23
  Administered 2016-07-23 (×3): 10 meq via INTRAVENOUS
  Filled 2016-07-22: qty 100

## 2016-07-22 MED ORDER — LORAZEPAM 2 MG/ML IJ SOLN
0.0000 mg | Freq: Two times a day (BID) | INTRAMUSCULAR | Status: AC
  Administered 2016-07-24 – 2016-07-25 (×2): 4 mg via INTRAVENOUS
  Administered 2016-07-26: 2 mg via INTRAVENOUS
  Filled 2016-07-22 (×3): qty 2

## 2016-07-22 MED ORDER — CLOPIDOGREL BISULFATE 75 MG PO TABS
75.0000 mg | ORAL_TABLET | Freq: Every day | ORAL | Status: DC
  Administered 2016-07-23 – 2016-08-04 (×12): 75 mg via ORAL
  Filled 2016-07-22 (×10): qty 1

## 2016-07-22 MED ORDER — VITAMIN B-1 100 MG PO TABS
100.0000 mg | ORAL_TABLET | Freq: Every day | ORAL | Status: DC
  Administered 2016-07-23 – 2016-07-26 (×3): 100 mg via ORAL
  Filled 2016-07-22 (×3): qty 1

## 2016-07-22 MED ORDER — LORAZEPAM 2 MG/ML IJ SOLN: 1.0000 mg | Freq: Four times a day (QID) | INTRAMUSCULAR | Status: DC | PRN

## 2016-07-22 MED ORDER — TIOTROPIUM BROMIDE MONOHYDRATE 18 MCG IN CAPS
18.0000 ug | ORAL_CAPSULE | Freq: Every day | RESPIRATORY_TRACT | Status: DC
  Administered 2016-07-23 – 2016-08-04 (×11): 18 ug via RESPIRATORY_TRACT
  Filled 2016-07-22 (×3): qty 5

## 2016-07-22 MED ORDER — THIAMINE HCL 100 MG/ML IJ SOLN
Freq: Once | INTRAVENOUS | Status: AC
  Administered 2016-07-23: via INTRAVENOUS
  Filled 2016-07-22: qty 1000

## 2016-07-22 MED ORDER — POTASSIUM CHLORIDE 10 MEQ/100ML IV SOLN
10.0000 meq | INTRAVENOUS | Status: AC
  Administered 2016-07-22 (×3): 10 meq via INTRAVENOUS
  Filled 2016-07-22 (×3): qty 100

## 2016-07-22 MED ORDER — ALBUTEROL SULFATE (2.5 MG/3ML) 0.083% IN NEBU: 3.0000 mL | INHALATION_SOLUTION | Freq: Four times a day (QID) | RESPIRATORY_TRACT | Status: DC | PRN

## 2016-07-22 MED ORDER — POTASSIUM CHLORIDE IN NACL 40-0.9 MEQ/L-% IV SOLN
INTRAVENOUS | Status: DC
  Administered 2016-07-23: 100 mL/h via INTRAVENOUS

## 2016-07-22 MED ORDER — SODIUM CHLORIDE 0.9 % IV BOLUS (SEPSIS)
2000.0000 mL | Freq: Once | INTRAVENOUS | Status: AC
  Administered 2016-07-22: 2000 mL via INTRAVENOUS

## 2016-07-22 MED ORDER — POTASSIUM CHLORIDE 10 MEQ/100ML IV SOLN
INTRAVENOUS | Status: AC
  Filled 2016-07-22: qty 100

## 2016-07-22 MED ORDER — ADULT MULTIVITAMIN W/MINERALS CH
1.0000 | ORAL_TABLET | Freq: Every day | ORAL | Status: DC
  Administered 2016-07-23 – 2016-08-04 (×11): 1 via ORAL
  Filled 2016-07-22 (×10): qty 1

## 2016-07-22 MED ORDER — SODIUM CHLORIDE 0.9 % IV SOLN: 1000.0000 mL | INTRAVENOUS | Status: DC

## 2016-07-22 NOTE — ED Notes (Signed)
Pt reports several days of falling where his left leg is weak

## 2016-07-22 NOTE — ED Provider Notes (Signed)
[facility] DEPT Provider Note   CSN: [id number] Arrival date & time: 07/22/16  1641     History   Chief Complaint Chief Complaint  Patient presents with  . Loss of Consciousness  . Bradycardia    HPI George Valdez is a 61 y.o. male.  Patient states that he has had numerous falls in the last few days and his left leg seems weaker than normal. Patient has a history of strokes   The history is provided by the patient. No language interpreter was used.  Fall  This is a recurrent problem. The current episode started more than 2 days ago. The problem occurs constantly. The problem has not changed since onset.Pertinent negatives include no chest pain, no abdominal pain and no headaches. Nothing aggravates the symptoms. Nothing relieves the symptoms. He has tried nothing for the symptoms.    Past Medical History:  Diagnosis Date  . Anxiety   . BPH (benign prostatic hyperplasia)   . Chronic pain   . Coronary artery disease   . Depression   . Hyperlipemia   . Hypertension   . Neuropathy ([place])   . Pernicious anemia   . Stroke [facility])     Patient Active Problem List   Diagnosis Date Noted  . Falls 07/22/2016  . Anemia 11/30/2015  . Hypokalemia 11/30/2015  . Acute renal failure (ARF) ([place]) 11/30/2015  . Acute encephalopathy 09/10/2014  . Alcohol withdrawal delirium ([place]) 09/10/2014  . ARF (acute renal failure) ([place]) 09/10/2014  . Sinus bradycardia 09/10/2014  . HTN (hypertension) 09/10/2014  . CAD (coronary artery disease) 09/10/2014  . PVD (peripheral vascular disease) ([place]) 09/10/2014  . Pernicious anemia 09/10/2014    Past Surgical History:  Procedure Laterality Date  . CARDIAC CATHETERIZATION    . CAROTID ENDARTERECTOMY         Home Medications    Prior to Admission medications   Medication Sig Start Date End Date Taking? Authorizing Provider  albuterol (PROVENTIL HFA;VENTOLIN HFA) 108 (90 BASE) MCG/ACT inhaler Inhale 2 puffs into the lungs  every 6 (six) hours as needed for wheezing or shortness of breath.   Yes Historical Provider, MD  ALPRAZolam Duanne Moron) 0.5 MG tablet Take 0.5 mg by mouth 2 (two) times daily as needed for anxiety.   Yes Historical Provider, MD  aspirin EC 81 MG tablet Take 81 mg by mouth daily. Reported on 11/16/2015   Yes Historical Provider, MD  baclofen (LIORESAL) 20 MG tablet Take 20 mg by mouth 2 (two) times daily as needed for muscle spasms.   Yes Historical Provider, MD  clopidogrel (PLAVIX) 75 MG tablet Take 75 mg by mouth daily.   Yes Historical Provider, MD  Cyanocobalamin (B-12) 1000 MCG/ML KIT Inject 1,000 mcg as directed every 30 (thirty) days.   Yes Historical Provider, MD  DULoxetine (CYMBALTA) 60 MG capsule Take 60 mg by mouth daily.   Yes Historical Provider, MD  enalapril (VASOTEC) 10 MG tablet Take 10 mg by mouth daily.   Yes Historical Provider, MD  folic acid (FOLVITE) 1 MG tablet Take 1 tablet (1 mg total) by mouth daily. 09/12/14  Yes Samuella Cota, MD  gabapentin (NEURONTIN) 800 MG tablet Take 800 mg by mouth 3 (three) times daily.   Yes Historical Provider, MD  HYDROcodone-acetaminophen (NORCO/VICODIN) 5-325 MG per tablet Take 1 tablet by mouth 3 (three) times daily as needed for moderate pain.   Yes Historical Provider, MD  isosorbide mononitrate (ISMO,MONOKET) 20 MG tablet Take 10 mg by mouth daily.  Yes Historical Provider, MD  metoprolol succinate (TOPROL-XL) 50 MG 24 hr tablet Take 50 mg by mouth 2 (two) times daily. Take with or immediately following a meal.   Yes Historical Provider, MD  Multiple Vitamin (MULTIVITAMIN WITH MINERALS) TABS tablet Take 1 tablet by mouth daily. 09/12/14  Yes Samuella Cota, MD  rosuvastatin (CRESTOR) 40 MG tablet Take 40 mg by mouth daily.   Yes Historical Provider, MD  thiamine 100 MG tablet Take 1 tablet (100 mg total) by mouth daily. 12/03/15  Yes Orvan Falconer, MD  tiotropium (SPIRIVA) 18 MCG inhalation capsule Place 18 mcg into inhaler and inhale daily.     Yes Historical Provider, MD  vitamin E 400 UNIT capsule Take 400 Units by mouth daily.   Yes Historical Provider, MD    Family History Family History  Problem Relation Age of Onset  . Congestive Heart Failure Mother 38  . Heart attack Father 19  . Congestive Heart Failure Sister     Social History Social History  Substance Use Topics  . Smoking status: Current Every Day Smoker    Packs/day: 1.00    Years: 40.00    Types: Cigarettes  . Smokeless tobacco: Never Used     Comment: down to 5 cigarettes/day from 1.5 PPD  . Alcohol use 7.2 oz/week    12 Standard drinks or equivalent per week     Allergies   Codeine   Review of Systems Review of Systems  Constitutional: Positive for fatigue. Negative for appetite change.       Patient has abrasions over his nose.  HENT: Negative for congestion, ear discharge and sinus pressure.   Eyes: Negative for discharge.  Respiratory: Negative for cough.   Cardiovascular: Negative for chest pain.  Gastrointestinal: Negative for abdominal pain and diarrhea.  Genitourinary: Negative for frequency and hematuria.  Musculoskeletal: Negative for back pain.       Weakness and left leg worse  Skin: Negative for rash.  Neurological: Negative for seizures and headaches.  Psychiatric/Behavioral: Negative for hallucinations.     Physical Exam Updated Vital Signs BP 112/66 (BP Location: Right Arm)   Pulse 64   Temp 98.5 F (36.9 C) (Oral)   Resp 16   Ht '5\' 4"'  (1.626 m)   Wt 165 lb (74.8 kg)   SpO2 96%   BMI 28.32 kg/m   Physical Exam  Constitutional: He is oriented to person, place, and time. He appears well-developed.  HENT:  Head: Normocephalic.  Eyes: Conjunctivae and EOM are normal. No scleral icterus.  Neck: Neck supple. No thyromegaly present.  Cardiovascular: Normal rate and regular rhythm.  Exam reveals no gallop and no friction rub.   No murmur heard. Pulmonary/Chest: No stridor. He has no wheezes. He has no rales. He  exhibits no tenderness.  Abdominal: He exhibits no distension. There is no tenderness. There is no rebound.  Musculoskeletal: He exhibits no edema.  Patient has severe weakness in left leg and weakness in left arm and slurred speech. This seems to be worse than his normal  Lymphadenopathy:    He has no cervical adenopathy.  Neurological: He is oriented to person, place, and time. He exhibits normal muscle tone. Coordination normal.  Skin: No rash noted. No erythema.  Psychiatric: He has a normal mood and affect. His behavior is normal.     ED Treatments / Results  Labs (all labs ordered are listed, but only abnormal results are displayed) Labs Reviewed  CBC WITH DIFFERENTIAL/PLATELET - Abnormal;  Notable for the following:       Result Value   WBC 11.9 (*)    RDW 15.7 (*)    Neutro Abs 9.7 (*)    All other components within normal limits  COMPREHENSIVE METABOLIC PANEL - Abnormal; Notable for the following:    Potassium 2.2 (*)    Glucose, Bld 105 (*)    Creatinine, Ser 2.03 (*)    Total Protein 6.3 (*)    Albumin 2.9 (*)    GFR calc non Af Amer 34 (*)    GFR calc Af Amer 39 (*)    All other components within normal limits  TROPONIN I - Abnormal; Notable for the following:    Troponin I 0.04 (*)    All other components within normal limits  URINALYSIS, ROUTINE W REFLEX MICROSCOPIC - Abnormal; Notable for the following:    Hgb urine dipstick MODERATE (*)    Protein, ur 100 (*)    All other components within normal limits  RAPID URINE DRUG SCREEN, HOSP PERFORMED - Abnormal; Notable for the following:    Benzodiazepines POSITIVE (*)    All other components within normal limits  TROPONIN I - Abnormal; Notable for the following:    Troponin I 0.04 (*)    All other components within normal limits  LACTIC ACID, PLASMA  ETHANOL  AMMONIA  MAGNESIUM  I-STAT CHEM 8, ED    EKG  EKG Interpretation  Date/Time:  Friday July 22 2016 16:47:06 EST Ventricular Rate:  73 PR  Interval:    QRS Duration: 110 QT Interval:  393 QTC Calculation: 433 R Axis:   -47 Text Interpretation:  Sinus rhythm Short PR interval LAD, consider left anterior fascicular block Borderline T abnormalities, anterior leads Confirmed by Mckell Riecke  MD, Riku Buttery [phone]) on 07/22/2016 7:57:54 PM       Radiology Dg Chest 2 View  Result Date: 07/22/2016 CLINICAL DATA:  Syncopal episode. EXAM: CHEST  2 VIEW COMPARISON:  11/30/2015 FINDINGS: AP and lateral views of the chest show left base atelectasis or infiltrate. Right lung clear. Cardiopericardial silhouette is at upper limits of normal for size. The visualized bony structures of the thorax are intact. Old right clavicle fracture noted. Telemetry leads overlie the chest. IMPRESSION: Minimal left base atelectasis or infiltrate. Electronically Signed   By: Misty Stanley M.D.   On: 07/22/2016 18:43   Ct Head Wo Contrast  Result Date: 07/22/2016 CLINICAL DATA:  Patient fell at home.  Abrasions to nose. EXAM: CT HEAD WITHOUT CONTRAST CT CERVICAL SPINE WITHOUT CONTRAST TECHNIQUE: Multidetector CT imaging of the head and cervical spine was performed following the standard protocol without intravenous contrast. Multiplanar CT image reconstructions of the cervical spine were also generated. COMPARISON:  Head CT 11/30/2015 cervical spine CT 09/05/2014. FINDINGS: CT HEAD FINDINGS Brain: Stable appearance old left inferior cerebellar infarct. There is no evidence for acute hemorrhage, hydrocephalus, mass lesion, or abnormal extra-axial fluid collection. No definite CT evidence for acute infarction. Lacunar infarcts noted in the basal ganglia bilaterally . Vascular: Atherosclerotic calcification is visualized in the carotid arteries. No dense MCA sign. Major dural sinuses are unremarkable. Skull: No evidence for fracture. No worrisome lytic or sclerotic lesion. Sinuses/Orbits: The visualized paranasal sinuses and mastoid air cells are clear. Visualized portions of the  globes and intraorbital fat are unremarkable. Other: None. CT CERVICAL SPINE FINDINGS Alignment: Straightening of the normal cervical lordosis evident. No subluxation. Skull base and vertebrae: No evidence of fracture from the skullbase through the T1 vertebral  body. Soft tissues and spinal canal: No prevertebral fluid or swelling. No visible canal hematoma. Multiple surgical clips noted left neck. Disc levels: Loss of disc height seen at C5-6. Facets are well aligned bilaterally. Upper chest: Negative. Other: None. IMPRESSION: 1. Stable head CT. Old left inferior cerebellar infarct without new or acute intracranial abnormality. 2.  Degenerative changes C5-6.  No cervical spine fracture. 3. Loss of cervical lordosis. This can be related to patient positioning, muscle spasm or soft tissue injury. Electronically Signed   By: Misty Stanley M.D.   On: 07/22/2016 19:32   Ct Cervical Spine Wo Contrast  Result Date: 07/22/2016 CLINICAL DATA:  Patient fell at home.  Abrasions to nose. EXAM: CT HEAD WITHOUT CONTRAST CT CERVICAL SPINE WITHOUT CONTRAST TECHNIQUE: Multidetector CT imaging of the head and cervical spine was performed following the standard protocol without intravenous contrast. Multiplanar CT image reconstructions of the cervical spine were also generated. COMPARISON:  Head CT 11/30/2015 cervical spine CT 09/05/2014. FINDINGS: CT HEAD FINDINGS Brain: Stable appearance old left inferior cerebellar infarct. There is no evidence for acute hemorrhage, hydrocephalus, mass lesion, or abnormal extra-axial fluid collection. No definite CT evidence for acute infarction. Lacunar infarcts noted in the basal ganglia bilaterally . Vascular: Atherosclerotic calcification is visualized in the carotid arteries. No dense MCA sign. Major dural sinuses are unremarkable. Skull: No evidence for fracture. No worrisome lytic or sclerotic lesion. Sinuses/Orbits: The visualized paranasal sinuses and mastoid air cells are clear.  Visualized portions of the globes and intraorbital fat are unremarkable. Other: None. CT CERVICAL SPINE FINDINGS Alignment: Straightening of the normal cervical lordosis evident. No subluxation. Skull base and vertebrae: No evidence of fracture from the skullbase through the T1 vertebral body. Soft tissues and spinal canal: No prevertebral fluid or swelling. No visible canal hematoma. Multiple surgical clips noted left neck. Disc levels: Loss of disc height seen at C5-6. Facets are well aligned bilaterally. Upper chest: Negative. Other: None. IMPRESSION: 1. Stable head CT. Old left inferior cerebellar infarct without new or acute intracranial abnormality. 2.  Degenerative changes C5-6.  No cervical spine fracture. 3. Loss of cervical lordosis. This can be related to patient positioning, muscle spasm or soft tissue injury. Electronically Signed   By: Misty Stanley M.D.   On: 07/22/2016 19:32   Mr Brain Wo Contrast  Result Date: 07/22/2016 CLINICAL DATA:  Initial evaluation for weakness, multiple falls. History of prior stroke. EXAM: MRI HEAD WITHOUT CONTRAST TECHNIQUE: Multiplanar, multiecho pulse sequences of the brain and surrounding structures were obtained without intravenous contrast. COMPARISON:  Prior CT from earlier the same day. FINDINGS: Brain: Study moderately degraded by motion artifact. Mild diffuse prominence of the CSF containing spaces is compatible with generalized cerebral atrophy. Patchy and confluent T2/FLAIR hyperintensity within the periventricular and deep white matter both cerebral hemispheres most compatible chronic vessel ischemic disease, mild to moderate nature. Chronic microvascular ischemic changes present within the pons as well. There are several superimposed small remote lacunar infarcts within the left basal ganglia. Small focus of encephalomalacia with gliosis within the cortical gray matter of the anterior left frontal lobe also compatible with a small remote infarct (series 9,  image 16). Large remote left cerebellar infarct noted. There is a punctate 4 mm focus of diffusion abnormality within the cortical gray matter of the left parietal lobe (series 3, image 94), suspicious for possible small acute/early subacute cortical infarct. No associated hemorrhage. No other evidence for acute or subacute ischemia. Gray-white matter differentiation otherwise maintained. No evidence  for acute or chronic intracranial hemorrhage. No mass lesion, midline shift or mass effect. No hydrocephalus. No extra-axial fluid collection. Major dural sinuses are grossly patent. Pituitary gland not well evaluated on this motion degraded study. Vascular: Left vertebral artery is CED small/hypoplastic, and possibly occluded. Major intracranial vascular flow voids otherwise maintained. Skull and upper cervical spine: Craniocervical junction within normal limits. Visualized upper cervical spine unremarkable. Bone marrow signal intensity within normal limits. No scalp soft tissue abnormality. Sinuses/Orbits: Globes and orbital soft tissues within normal limits. Mild scattered mucosal thickening within the ethmoidal air cells and maxillary sinuses. No air-fluid level to suggest active sinus infection. No significant mastoid effusion. Inner ear structures grossly normal. IMPRESSION: 1. Punctate 4 mm focus of diffusion abnormality within the cortical gray matter of the left parietal lobe, suspicious for tiny acute/early subacute ischemic infarct. No associated hemorrhage. 2. No other acute intracranial process identified. 3. Large remote left cerebellar infarct, with additional small remote left frontal cortical infarct and remote lacunar infarcts within the left basal ganglia. 4. Mild to moderate chronic microvascular ischemic disease. Electronically Signed   By: Jeannine Boga M.D.   On: 07/22/2016 21:54    Procedures Procedures (including critical care time)  Medications Ordered in ED Medications    potassium chloride 10 mEq in 100 mL IVPB (10 mEq Intravenous New Bag/Given 07/22/16 2158)  0.9 %  sodium chloride infusion (0 mLs Intravenous Stopped 07/22/16 2201)    Followed by  0.9 %  sodium chloride infusion (not administered)  sodium chloride 0.9 % bolus 2,000 mL (0 mLs Intravenous Stopped 07/22/16 2201)     Initial Impression / Assessment and Plan / ED Course  I have reviewed the triage vital signs and the nursing notes.  Pertinent labs & imaging results that were available during my care of the patient were reviewed by me and considered in my medical decision making (see chart for details). CRITICAL CARE Performed by: Haroun Cotham L Total critical care time:minutes Critical care time was exclusive of separately billable procedures and treating other patients. Critical care was necessary to treat or prevent imminent or life-threatening deterioration. Critical care was time spent personally by me on the following activities: development of treatment plan with patient and/or surrogate as well as nursing, discussions with consultants, evaluation of patient's response to treatment, examination of patient, obtaining history from patient or surrogate, ordering and performing treatments and interventions, ordering and review of laboratory studies, ordering and review of radiographic studies, pulse oximetry and re-evaluation of patient's condition.    Patient with frequent falls hypokalemia. Patient also had periods of hypotension. MRI shows new small stroke.  Patient will be admitted to stepdown with hypokalemia and frequent falls Final Clinical Impressions(s) / ED Diagnoses   Final diagnoses:  Frequent falls    New Prescriptions New Prescriptions   No medications on file     Milton Ferguson, MD 07/22/16 2247

## 2016-07-22 NOTE — ED Notes (Signed)
Patient transported to CT 

## 2016-07-22 NOTE — ED Notes (Signed)
Pt reports muiltple falls recently EMS called to his home for Fall, found him to have bradycardia and hypotension.   Gave atropine and a bolus

## 2016-07-22 NOTE — H&P (Signed)
History and Physical    George Valdez JEH:[id number] DOB: 1955-07-20 DOA: 07/22/2016  PCP: Jana Half  Patient coming from: home  Chief Complaint: weakness  HPI: George Valdez is a 61 y.o. male with medical history significant of previous CVA with left sided weakness, etoh abuse last drink 12 days ago, chronic pain, HTN, CAD , COPD comes in with frequent falls and says he is weaker than normal.  Pt reports his left knee "just goes out" and "buckles" and he falls.  He denies any LOC.  He denies any recent illnesses.  No fevers.  No n/v/d.  Pt appears disheveled and has multiple scratches and open wounds to arms he says is due to his falls.  He denies any pain anywhere.  Denies any sob, cough or flu like symptoms.  He reports his left side may have been more weak than the other but overall he says he just felt weak.  Pt found to have k level of 2.2 and low bp and HR.  EMS gave him a dose of atropine in the field.  Pt has been given several liters of ivf and he says he is feeling much better.  He is referred for admission for low bp, low k level, dehydration with AKI.  He denies any urinary symptoms.   Review of Systems: As per HPI otherwise 10 point review of systems negative.   Past Medical History:  Diagnosis Date  . Anxiety   . BPH (benign prostatic hyperplasia)   . Chronic pain   . Coronary artery disease   . Depression   . Hyperlipemia   . Hypertension   . Neuropathy ([place])   . Pernicious anemia   . Stroke [facility])     Past Surgical History:  Procedure Laterality Date  . CARDIAC CATHETERIZATION    . CAROTID ENDARTERECTOMY       reports that he has been smoking Cigarettes.  He has a 40.00 pack-year smoking history. He has never used smokeless tobacco. He reports that he drinks about 7.2 oz of alcohol per week . He reports that he does not use drugs.  Allergies  Allergen Reactions  . Codeine Itching    Family History  Problem Relation Age of Onset    . Congestive Heart Failure Mother 43  . Heart attack Father 61  . Congestive Heart Failure Sister     Prior to Admission medications   Medication Sig Start Date End Date Taking? Authorizing Provider  albuterol (PROVENTIL HFA;VENTOLIN HFA) 108 (90 BASE) MCG/ACT inhaler Inhale 2 puffs into the lungs every 6 (six) hours as needed for wheezing or shortness of breath.   Yes Historical Provider, MD  ALPRAZolam Duanne Moron) 0.5 MG tablet Take 0.5 mg by mouth 2 (two) times daily as needed for anxiety.   Yes Historical Provider, MD  aspirin EC 81 MG tablet Take 81 mg by mouth daily. Reported on 11/16/2015   Yes Historical Provider, MD  baclofen (LIORESAL) 20 MG tablet Take 20 mg by mouth 2 (two) times daily as needed for muscle spasms.   Yes Historical Provider, MD  clopidogrel (PLAVIX) 75 MG tablet Take 75 mg by mouth daily.   Yes Historical Provider, MD  Cyanocobalamin (B-12) 1000 MCG/ML KIT Inject 1,000 mcg as directed every 30 (thirty) days.   Yes Historical Provider, MD  DULoxetine (CYMBALTA) 60 MG capsule Take 60 mg by mouth daily.   Yes Historical Provider, MD  enalapril (VASOTEC) 10 MG tablet Take 10 mg by mouth daily.  Yes Historical Provider, MD  folic acid (FOLVITE) 1 MG tablet Take 1 tablet (1 mg total) by mouth daily. 09/12/14  Yes Samuella Cota, MD  gabapentin (NEURONTIN) 800 MG tablet Take 800 mg by mouth 3 (three) times daily.   Yes Historical Provider, MD  HYDROcodone-acetaminophen (NORCO/VICODIN) 5-325 MG per tablet Take 1 tablet by mouth 3 (three) times daily as needed for moderate pain.   Yes Historical Provider, MD  isosorbide mononitrate (ISMO,MONOKET) 20 MG tablet Take 10 mg by mouth daily.   Yes Historical Provider, MD  metoprolol succinate (TOPROL-XL) 50 MG 24 hr tablet Take 50 mg by mouth 2 (two) times daily. Take with or immediately following a meal.   Yes Historical Provider, MD  Multiple Vitamin (MULTIVITAMIN WITH MINERALS) TABS tablet Take 1 tablet by mouth daily. 09/12/14   Yes Samuella Cota, MD  rosuvastatin (CRESTOR) 40 MG tablet Take 40 mg by mouth daily.   Yes Historical Provider, MD  thiamine 100 MG tablet Take 1 tablet (100 mg total) by mouth daily. 12/03/15  Yes Orvan Falconer, MD  tiotropium (SPIRIVA) 18 MCG inhalation capsule Place 18 mcg into inhaler and inhale daily.    Yes Historical Provider, MD  vitamin E 400 UNIT capsule Take 400 Units by mouth daily.   Yes Historical Provider, MD    Physical Exam: Vitals:   07/22/16 2100 07/22/16 2152 07/22/16 2211 07/22/16 2230  BP: (!) 75/50 (!) 75/52 112/66 100/72  Pulse:  (!) 58 64 (!) 59  Resp: _0 Temp:      TempSrc:      SpO2:  90% 96% 94%  Weight:      Height:        Constitutional: NAD, calm, comfortable Vitals:   07/22/16 2100 07/22/16 2152 07/22/16 2211 07/22/16 2230  BP: (!) 75/50 (!) 75/52 112/66 100/72  Pulse:  (!) 58 64 (!) 59  Resp: _1 Temp:      TempSrc:      SpO2:  90% 96% 94%  Weight:      Height:       Eyes: PERRL, lids and conjunctivae normal, pt disheveled and not well kept ENMT: Mucous membranes are dry. Posterior pharynx clear of any exudate or lesions.Normal dentition.  Neck: normal, supple, no masses, no thyromegaly Respiratory: clear to auscultation bilaterally, no wheezing, no crackles. Normal respiratory effort. No accessory muscle use.  Cardiovascular: Regular rate and rhythm, no murmurs / rubs / gallops. No extremity edema. 2+ pedal pulses. No carotid bruits.  Abdomen: no tenderness, no masses palpated. No hepatosplenomegaly. Bowel sounds positive.  Musculoskeletal: no clubbing / cyanosis. No joint deformity upper and lower extremities. Good ROM, no contractures. Normal muscle tone.  Skin: no rashes, lesions, ulcers. No induration Neurologic: CN 2-12 grossly intact. Sensation intact, DTR normal. Strength 5/5 in right left 4/5 both upper and lower.  Psychiatric: Normal judgment and insight. Alert and oriented x 3. Normal mood.    Labs on Admission: I  have personally reviewed following labs and imaging studies  CBC:  Recent Labs Lab 07/22/16 1820  WBC 11.9*  NEUTROABS 9.7*  HGB 13.8  HCT 40.2  MCV 91.8  PLT 155   Basic Metabolic Panel:  Recent Labs Lab 07/22/16 1820  NA 144  K 2.2*  CL 110  CO2 26  GLUCOSE 105*  BUN 17  CREATININE 2.03*  CALCIUM 8.9   GFR: Estimated Creatinine Clearance: 35.3 mL/min (by C-G formula based on SCr of  2.03 mg/dL (H)). Liver Function Tests:  Recent Labs Lab 07/22/16 1820  AST 25  ALT 17  ALKPHOS 81  BILITOT 0.6  PROT 6.3*  ALBUMIN 2.9*    Recent Labs Lab 07/22/16 1821  AMMONIA 24    Cardiac Enzymes:  Recent Labs Lab 07/22/16 1820 07/22/16 2030  TROPONINI 0.04* 0.04*    Urine analysis:    Component Value Date/Time   COLORURINE YELLOW 07/22/2016 1900   APPEARANCEUR CLEAR 07/22/2016 1900   LABSPEC 1.005 07/22/2016 1900   PHURINE 7.0 07/22/2016 1900   GLUCOSEU NEGATIVE 07/22/2016 1900   HGBUR MODERATE (A) 07/22/2016 1900   BILIRUBINUR NEGATIVE 07/22/2016 1900   KETONESUR NEGATIVE 07/22/2016 1900   PROTEINUR 100 (A) 07/22/2016 1900   UROBILINOGEN 0.2 09/10/2014 0908   NITRITE NEGATIVE 07/22/2016 1900   LEUKOCYTESUR NEGATIVE 07/22/2016 1900   Radiological Exams on Admission: Dg Chest 2 View  Result Date: 07/22/2016 CLINICAL DATA:  Syncopal episode. EXAM: CHEST  2 VIEW COMPARISON:  11/30/2015 FINDINGS: AP and lateral views of the chest show left base atelectasis or infiltrate. Right lung clear. Cardiopericardial silhouette is at upper limits of normal for size. The visualized bony structures of the thorax are intact. Old right clavicle fracture noted. Telemetry leads overlie the chest. IMPRESSION: Minimal left base atelectasis or infiltrate. Electronically Signed   By: Misty Stanley M.D.   On: 07/22/2016 18:43   Ct Head Wo Contrast  Result Date: 07/22/2016 CLINICAL DATA:  Patient fell at home.  Abrasions to nose. EXAM: CT HEAD WITHOUT CONTRAST CT CERVICAL SPINE  WITHOUT CONTRAST TECHNIQUE: Multidetector CT imaging of the head and cervical spine was performed following the standard protocol without intravenous contrast. Multiplanar CT image reconstructions of the cervical spine were also generated. COMPARISON:  Head CT 11/30/2015 cervical spine CT 09/05/2014. FINDINGS: CT HEAD FINDINGS Brain: Stable appearance old left inferior cerebellar infarct. There is no evidence for acute hemorrhage, hydrocephalus, mass lesion, or abnormal extra-axial fluid collection. No definite CT evidence for acute infarction. Lacunar infarcts noted in the basal ganglia bilaterally . Vascular: Atherosclerotic calcification is visualized in the carotid arteries. No dense MCA sign. Major dural sinuses are unremarkable. Skull: No evidence for fracture. No worrisome lytic or sclerotic lesion. Sinuses/Orbits: The visualized paranasal sinuses and mastoid air cells are clear. Visualized portions of the globes and intraorbital fat are unremarkable. Other: None. CT CERVICAL SPINE FINDINGS Alignment: Straightening of the normal cervical lordosis evident. No subluxation. Skull base and vertebrae: No evidence of fracture from the skullbase through the T1 vertebral body. Soft tissues and spinal canal: No prevertebral fluid or swelling. No visible canal hematoma. Multiple surgical clips noted left neck. Disc levels: Loss of disc height seen at C5-6. Facets are well aligned bilaterally. Upper chest: Negative. Other: None. IMPRESSION: 1. Stable head CT. Old left inferior cerebellar infarct without new or acute intracranial abnormality. 2.  Degenerative changes C5-6.  No cervical spine fracture. 3. Loss of cervical lordosis. This can be related to patient positioning, muscle spasm or soft tissue injury. Electronically Signed   By: Misty Stanley M.D.   On: 07/22/2016 19:32   Ct Cervical Spine Wo Contrast  Result Date: 07/22/2016 CLINICAL DATA:  Patient fell at home.  Abrasions to nose. EXAM: CT HEAD WITHOUT  CONTRAST CT CERVICAL SPINE WITHOUT CONTRAST TECHNIQUE: Multidetector CT imaging of the head and cervical spine was performed following the standard protocol without intravenous contrast. Multiplanar CT image reconstructions of the cervical spine were also generated. COMPARISON:  Head CT 11/30/2015 cervical spine  CT 09/05/2014. FINDINGS: CT HEAD FINDINGS Brain: Stable appearance old left inferior cerebellar infarct. There is no evidence for acute hemorrhage, hydrocephalus, mass lesion, or abnormal extra-axial fluid collection. No definite CT evidence for acute infarction. Lacunar infarcts noted in the basal ganglia bilaterally . Vascular: Atherosclerotic calcification is visualized in the carotid arteries. No dense MCA sign. Major dural sinuses are unremarkable. Skull: No evidence for fracture. No worrisome lytic or sclerotic lesion. Sinuses/Orbits: The visualized paranasal sinuses and mastoid air cells are clear. Visualized portions of the globes and intraorbital fat are unremarkable. Other: None. CT CERVICAL SPINE FINDINGS Alignment: Straightening of the normal cervical lordosis evident. No subluxation. Skull base and vertebrae: No evidence of fracture from the skullbase through the T1 vertebral body. Soft tissues and spinal canal: No prevertebral fluid or swelling. No visible canal hematoma. Multiple surgical clips noted left neck. Disc levels: Loss of disc height seen at C5-6. Facets are well aligned bilaterally. Upper chest: Negative. Other: None. IMPRESSION: 1. Stable head CT. Old left inferior cerebellar infarct without new or acute intracranial abnormality. 2.  Degenerative changes C5-6.  No cervical spine fracture. 3. Loss of cervical lordosis. This can be related to patient positioning, muscle spasm or soft tissue injury. Electronically Signed   By: Misty Stanley M.D.   On: 07/22/2016 19:32   Mr Brain Wo Contrast  Result Date: 07/22/2016 CLINICAL DATA:  Initial evaluation for weakness, multiple falls.  History of prior stroke. EXAM: MRI HEAD WITHOUT CONTRAST TECHNIQUE: Multiplanar, multiecho pulse sequences of the brain and surrounding structures were obtained without intravenous contrast. COMPARISON:  Prior CT from earlier the same day. FINDINGS: Brain: Study moderately degraded by motion artifact. Mild diffuse prominence of the CSF containing spaces is compatible with generalized cerebral atrophy. Patchy and confluent T2/FLAIR hyperintensity within the periventricular and deep white matter both cerebral hemispheres most compatible chronic vessel ischemic disease, mild to moderate nature. Chronic microvascular ischemic changes present within the pons as well. There are several superimposed small remote lacunar infarcts within the left basal ganglia. Small focus of encephalomalacia with gliosis within the cortical gray matter of the anterior left frontal lobe also compatible with a small remote infarct (series 9, image 16). Large remote left cerebellar infarct noted. There is a punctate 4 mm focus of diffusion abnormality within the cortical gray matter of the left parietal lobe (series 3, image 94), suspicious for possible small acute/early subacute cortical infarct. No associated hemorrhage. No other evidence for acute or subacute ischemia. Gray-white matter differentiation otherwise maintained. No evidence for acute or chronic intracranial hemorrhage. No mass lesion, midline shift or mass effect. No hydrocephalus. No extra-axial fluid collection. Major dural sinuses are grossly patent. Pituitary gland not well evaluated on this motion degraded study. Vascular: Left vertebral artery is CED small/hypoplastic, and possibly occluded. Major intracranial vascular flow voids otherwise maintained. Skull and upper cervical spine: Craniocervical junction within normal limits. Visualized upper cervical spine unremarkable. Bone marrow signal intensity within normal limits. No scalp soft tissue abnormality. Sinuses/Orbits:  Globes and orbital soft tissues within normal limits. Mild scattered mucosal thickening within the ethmoidal air cells and maxillary sinuses. No air-fluid level to suggest active sinus infection. No significant mastoid effusion. Inner ear structures grossly normal. IMPRESSION: 1. Punctate 4 mm focus of diffusion abnormality within the cortical gray matter of the left parietal lobe, suspicious for tiny acute/early subacute ischemic infarct. No associated hemorrhage. 2. No other acute intracranial process identified. 3. Large remote left cerebellar infarct, with additional small remote left frontal cortical  infarct and remote lacunar infarcts within the left basal ganglia. 4. Mild to moderate chronic microvascular ischemic disease. Electronically Signed   By: Jeannine Boga M.D.   On: 07/22/2016 21:54    EKG: Independently reviewed. nsr no acute issues cxr reviewed no edema , infiltrate vs atelectasis noted Old records reviewed Case discussed with dr Reather Converse  Assessment/Plan 61 yo male with generalized weakness, freq falls, dehydration with AKI, hypokalemia  Principal Problem:   Hypokalemia- pt was hospitalized for same in the summer.  Check mag level.  Replete k with iv kcl and recheck level later tonight.  Pt overall dehydrated suspect chronic etoh abuse playing a role in his overall malnourishment.  Active Problems:   Sinus bradycardia- hold all cardiac meds, replete k level    Hypotension- responded to ivf.  No source of infection, lactate normal.  Think this is due to volume depletion.  Check procalcitonin level.  No abx at this time,  Ck orthostatics per shift.    Acute renal failure (ARF) (HCC)- ivf, repeat cr in am    Weakness of left side of body- noted, at baseline    Alcohol abuse- reports last drink was over 10 days ago, CIWA protocol anyway    CAD (coronary artery disease)- noted    PVD (peripheral vascular disease) ([place])- noted    Falls- noted    h/o CVA (cerebral  vascular accident) ([place]) with left sided weakness- noted     DVT prophylaxis: scds  Code Status:  full Family Communication: none Disposition Plan:  Per day team Consults called:  none Admission status:  admission  Pt seen before midnight  Yancey Pedley A MD [facility]  If 7PM-7AM, please contact night-coverage [email or web address] Password [email or web address]  07/22/2016, 11:18 PM

## 2016-07-22 NOTE — ED Notes (Signed)
Pt remains in CT

## 2016-07-22 NOTE — ED Notes (Signed)
CRITICAL VALUE ALERT  Critical value received:  Potassium 2.2 Troponin 0.04  Date of notification:  07/22/2016  Time of notification:  1953  Critical value read back:Yes.    Nurse who received alert:  bkn  MD notified (1st page):  [place]  Time of first page:  1956  MD notified (2nd page):  Time of second page:  Responding MD:    Time MD responded:

## 2016-07-22 NOTE — ED Notes (Addendum)
Spouse called reports pt has a history of several strokes- She states he has had three strokes in the past - she reports that he falls frequently in last several days and she reports that could not  hold anything and drops everything but he would not come to ED-  Spouse has slightly slurred speech- She reports that spouse has slowed up "a whole lot in the past year"- states he "used to be bad" - states he used to drink a 12 pack daily

## 2016-07-22 NOTE — ED Triage Notes (Signed)
Pt reports multiple falls and syncopal episodes x 2 days.  Ems called for syncope and fall on face today.  Pt found to be hypotensive and bradycardia.  Given Atropine 0.5mg  iv by ems and ns 516ml

## 2016-07-23 LAB — BASIC METABOLIC PANEL
ANION GAP: 10 (ref 5–15)
Anion gap: 10 (ref 5–15)
BUN: 17 mg/dL (ref 6–20)
BUN: 17 mg/dL (ref 6–20)
CALCIUM: 8.2 mg/dL — AB (ref 8.9–10.3)
CO2: 20 mmol/L — ABNORMAL LOW (ref 22–32)
CO2: 21 mmol/L — ABNORMAL LOW (ref 22–32)
Calcium: 8.1 mg/dL — ABNORMAL LOW (ref 8.9–10.3)
Chloride: 114 mmol/L — ABNORMAL HIGH (ref 101–111)
Chloride: 114 mmol/L — ABNORMAL HIGH (ref 101–111)
Creatinine, Ser: 1.82 mg/dL — ABNORMAL HIGH (ref 0.61–1.24)
Creatinine, Ser: 1.75 mg/dL — ABNORMAL HIGH (ref 0.61–1.24)
GFR calc Af Amer: 45 mL/min — ABNORMAL LOW (ref >60)
GFR, EST AFRICAN AMERICAN: 47 mL/min — AB (ref >60)
GFR, EST NON AFRICAN AMERICAN: 38 mL/min — AB (ref >60)
GFR, EST NON AFRICAN AMERICAN: 40 mL/min — AB (ref >60)
GLUCOSE: 92 mg/dL (ref 65–99)
GLUCOSE: 80 mg/dL (ref 65–99)
POTASSIUM: 2.2 mmol/L — AB (ref 3.5–5.1)
POTASSIUM: 2.2 mmol/L — AB (ref 3.5–5.1)
Sodium: 144 mmol/L (ref 135–145)
Sodium: 145 mmol/L (ref 135–145)

## 2016-07-23 LAB — CBC
HEMATOCRIT: 39.1 % (ref 39.0–52.0)
HEMOGLOBIN: 13.3 g/dL (ref 13.0–17.0)
MCH: 31.4 pg (ref 26.0–34.0)
MCHC: 34 g/dL (ref 30.0–36.0)
MCV: 92.4 fL (ref 78.0–100.0)
PLATELETS: 289 10*3/uL (ref 150–400)
RBC: 4.23 MIL/uL (ref 4.22–5.81)
RDW: 16 % — AB (ref 11.5–15.5)
WBC: 11.9 10*3/uL — ABNORMAL HIGH (ref 4.0–10.5)

## 2016-07-23 LAB — MRSA PCR SCREENING: MRSA BY PCR: NEGATIVE

## 2016-07-23 LAB — MAGNESIUM
Magnesium: 1.9 mg/dL (ref 1.7–2.4)
Magnesium: 1.7 mg/dL (ref 1.7–2.4)

## 2016-07-23 LAB — TROPONIN I
TROPONIN I: 0.06 ng/mL — AB (ref <0.03)
TROPONIN I: 0.06 ng/mL — AB (ref <0.03)
Troponin I: 0.04 ng/mL (ref <0.03)

## 2016-07-23 LAB — POTASSIUM: Potassium: 2.5 mmol/L — CL (ref 3.5–5.1)

## 2016-07-23 LAB — VITAMIN B12: Vitamin B-12: 5354 pg/mL — ABNORMAL HIGH (ref 180–914)

## 2016-07-23 LAB — PROCALCITONIN

## 2016-07-23 MED ORDER — THIAMINE HCL 100 MG/ML IJ SOLN
INTRAMUSCULAR | Status: AC
  Filled 2016-07-23: qty 2

## 2016-07-23 MED ORDER — POTASSIUM CHLORIDE 20 MEQ PO PACK
20.0000 meq | PACK | Freq: Two times a day (BID) | ORAL | Status: DC
  Filled 2016-07-23 (×5): qty 1

## 2016-07-23 MED ORDER — POTASSIUM CHLORIDE CRYS ER 20 MEQ PO TBCR
40.0000 meq | EXTENDED_RELEASE_TABLET | Freq: Four times a day (QID) | ORAL | Status: DC
  Administered 2016-07-23 – 2016-07-24 (×2): 40 meq via ORAL
  Filled 2016-07-23: qty 4
  Filled 2016-07-23: qty 2

## 2016-07-23 MED ORDER — POTASSIUM CHLORIDE 10 MEQ/100ML IV SOLN
10.0000 meq | INTRAVENOUS | Status: AC
  Administered 2016-07-23 (×6): 10 meq via INTRAVENOUS
  Filled 2016-07-23 (×5): qty 100

## 2016-07-23 MED ORDER — SODIUM CHLORIDE 0.9 % IV SOLN
INTRAVENOUS | Status: AC
  Administered 2016-07-23 (×2): via INTRAVENOUS

## 2016-07-23 MED ORDER — PRO-STAT SUGAR FREE PO LIQD
30.0000 mL | Freq: Three times a day (TID) | ORAL | Status: DC
  Administered 2016-07-23 – 2016-08-04 (×22): 30 mL via ORAL
  Filled 2016-07-23 (×22): qty 30

## 2016-07-23 MED ORDER — MAGNESIUM SULFATE IN D5W 1-5 GM/100ML-% IV SOLN
INTRAVENOUS | Status: AC
  Filled 2016-07-23: qty 100

## 2016-07-23 MED ORDER — PNEUMOCOCCAL VAC POLYVALENT 25 MCG/0.5ML IJ INJ
0.5000 mL | INJECTION | INTRAMUSCULAR | Status: DC
  Filled 2016-07-23 (×2): qty 0.5

## 2016-07-23 MED ORDER — MAGNESIUM SULFATE 50 % IJ SOLN
1.0000 g | Freq: Once | INTRAMUSCULAR | Status: AC
  Administered 2016-07-23: 1 g via INTRAVENOUS
  Filled 2016-07-23: qty 2

## 2016-07-23 MED ORDER — METOPROLOL SUCCINATE ER 25 MG PO TB24
25.0000 mg | ORAL_TABLET | Freq: Two times a day (BID) | ORAL | Status: DC
  Administered 2016-07-23 – 2016-07-24 (×3): 25 mg via ORAL
  Filled 2016-07-23 (×3): qty 1

## 2016-07-23 MED ORDER — ROSUVASTATIN CALCIUM 20 MG PO TABS
40.0000 mg | ORAL_TABLET | Freq: Every day | ORAL | Status: DC
  Administered 2016-07-23 – 2016-08-03 (×7): 40 mg via ORAL
  Filled 2016-07-23 (×7): qty 2

## 2016-07-23 MED ORDER — ISOSORBIDE MONONITRATE 20 MG PO TABS
10.0000 mg | ORAL_TABLET | Freq: Every day | ORAL | Status: DC
  Administered 2016-07-23 – 2016-08-04 (×10): 10 mg via ORAL
  Filled 2016-07-23 (×10): qty 1

## 2016-07-23 MED ORDER — POTASSIUM CHLORIDE 10 MEQ/100ML IV SOLN
10.0000 meq | INTRAVENOUS | Status: AC
  Administered 2016-07-23 – 2016-07-24 (×6): 10 meq via INTRAVENOUS
  Filled 2016-07-23 (×3): qty 100

## 2016-07-23 MED ORDER — HEPARIN SODIUM (PORCINE) 5000 UNIT/ML IJ SOLN
5000.0000 [IU] | Freq: Three times a day (TID) | INTRAMUSCULAR | Status: DC
Start: 2016-07-23 — End: 2016-08-04
  Administered 2016-07-23 – 2016-08-04 (×36): 5000 [IU] via SUBCUTANEOUS
  Filled 2016-07-23 (×35): qty 1

## 2016-07-23 MED ORDER — FOLIC ACID 5 MG/ML IJ SOLN
INTRAMUSCULAR | Status: AC
Start: 2016-07-23 — End: 2016-07-23
  Filled 2016-07-23: qty 0.2

## 2016-07-23 MED ORDER — M.V.I. ADULT IV INJ
INJECTION | INTRAVENOUS | Status: AC
  Filled 2016-07-23: qty 10

## 2016-07-23 MED ORDER — INFLUENZA VAC SPLIT QUAD 0.5 ML IM SUSY
0.5000 mL | PREFILLED_SYRINGE | INTRAMUSCULAR | Status: DC
  Filled 2016-07-23 (×2): qty 0.5

## 2016-07-23 MED ORDER — CYANOCOBALAMIN 1000 MCG/ML IJ SOLN
1000.0000 ug | INTRAMUSCULAR | Status: DC
  Administered 2016-07-23: 1000 ug via INTRAMUSCULAR
  Filled 2016-07-23: qty 1

## 2016-07-23 MED ORDER — POTASSIUM CHLORIDE CRYS ER 20 MEQ PO TBCR
40.0000 meq | EXTENDED_RELEASE_TABLET | Freq: Four times a day (QID) | ORAL | Status: AC
  Administered 2016-07-23 – 2016-07-24 (×3): 40 meq via ORAL
  Filled 2016-07-23: qty 2
  Filled 2016-07-23: qty 4
  Filled 2016-07-23: qty 2

## 2016-07-23 NOTE — Progress Notes (Signed)
Dr. Candiss Norse informed about potassium being 2.5 via text page.  New order noted.

## 2016-07-23 NOTE — Progress Notes (Signed)
PROGRESS NOTE                                                                                                                                                                                                             Patient Demographics:    George Valdez, is a 61 y.o. male, DOB - 04/25/1956, VD:[id number]  Admit date - 07/22/2016   Admitting Physician Phillips Grout, MD  Outpatient Primary MD for the patient is Jana Half  LOS - 1  Chief Complaint  Patient presents with  . Loss of Consciousness  . Bradycardia       Brief Narrative  George Valdez is a 61 y.o. male with medical history significant of previous CVA with left sided weakness, etoh abuse last drink 12 days ago, chronic pain, HTN, CKD 4, CAD , COPD comes in with frequent falls and says he is weaker than normal. In the ER he was found to be severely dehydrated, was severely hypokalemic and wasn't ARF.   Subjective:    [name] today has, No headache, No chest pain, No abdominal pain - No Nausea, No new weakness tingling or numbness, No Cough - SOB.     Assessment  & Plan :     1.Severe dehydration, ARF with severe hyperkalemia. This is from poor oral intake, Replace potassium, hydrate with IV fluids, repeat BMP in the morning.  2. ARF on CK D4. Baseline creatinine close to 1.7. Due to dehydration and being on ACE inhibitor, stop ACE, hydrate and monitor. Recent ultrasound noted showing medical renal disease.  3. Ongoing alcohol abuse. We will go in DTs, continue CIWA protocol place him on low-dose Librium. Counseled to quit alcohol. We'll check B-12 level.  4. CAD/PAD. Chest pain free. Troponin trend flat and non-ACS, continue aspirin, statin, Plavix, Imdur, low-dose beta blocker once blood pressure stabilizes. No acute issues.  5. History of CVA with mild left-sided weakness. Supportive care. Continue dual  antiplatelet therapy and statin for secondary prevention.   Family Communication  :  friend  Code Status :  Full  Diet : Diet full liquid Room service appropriate? Yes; Fluid consistency: Thin   Disposition Plan  :  Home 1-2 days  Consults  :  None  Procedures  :    CT scan of head and C-spine. Nonacute  DVT Prophylaxis  :  Heparin  Lab Results  Component Value Date   PLT 289 07/23/2016    Inpatient Medications  Scheduled Meds: . aspirin EC  81 mg Oral Daily  . clopidogrel  75 mg Oral Daily  . cyanocobalamin  1,000 mcg Intramuscular Q30 days  . DULoxetine  60 mg Oral Daily  . feeding supplement (PRO-STAT SUGAR FREE 64)  30 mL Oral TID WC  . folic acid  1 mg Oral Daily  . heparin subcutaneous  5,000 Units Subcutaneous Q8H  . [START ON 07/24/2016] Influenza vac split quadrivalent PF  0.5 mL Intramuscular Tomorrow-1000  . isosorbide mononitrate  10 mg Oral Daily  . LORazepam  0-4 mg Intravenous Q6H   Followed by  . [START ON 07/25/2016] LORazepam  0-4 mg Intravenous Q12H  . metoprolol succinate  25 mg Oral BID WC  . multivitamin with minerals  1 tablet Oral Daily  . [START ON 07/24/2016] pneumococcal 23 valent vaccine  0.5 mL Intramuscular Tomorrow-1000  . potassium chloride  10 mEq Intravenous Q1 Hr x 6  . potassium chloride SA  40 mEq Oral Q6H  . rosuvastatin  40 mg Oral Daily  . thiamine  100 mg Oral Daily   Or  . thiamine  100 mg Intravenous Daily  . tiotropium  18 mcg Inhalation Daily   Continuous Infusions: . sodium chloride 75 mL/hr at 07/23/16 0756   PRN Meds:.albuterol, LORazepam **OR** LORazepam  Antibiotics  :    Anti-infectives    None         Objective:   Vitals:   07/23/16 0400 07/23/16 0500 07/23/16 0600 07/23/16 0700  BP: (!) 116/102 102/62 113/68 109/61  Pulse: 72 (!) 57 (!) 51 72  Resp: (!) 24 10 14 15   Temp: 98.2 F (36.8 C)     TempSrc: Oral     SpO2: 99% 100% 100% 95%  Weight: 68.9 kg (151 lb 14.4 oz)     Height:        Wt  Readings from Last 3 Encounters:  07/23/16 68.9 kg (151 lb 14.4 oz)  12/03/15 81.3 kg (179 lb 4.5 oz)  11/16/15 89.8 kg (198 lb)     Intake/Output Summary (Last 24 hours) at 07/23/16 0829 Last data filed at 07/23/16 0700  Gross per 24 hour  Intake              995 ml  Output              950 ml  Net               45 ml     Physical Exam  Awake Alert, Oriented X 2, No new F.N deficits, Normal affect [place].AT,PERRAL, Supple Neck,No JVD, No cervical lymphadenopathy appriciated.  Symmetrical Chest wall movement, Good air movement bilaterally, CTAB RRR,No Gallops,Rubs or new Murmurs, No Parasternal Heave +ve B.Sounds, Abd Soft, No tenderness, No organomegaly appriciated, No rebound - guarding or rigidity. No Cyanosis, Clubbing or edema, No new Rash or bruise       Data Review:    CBC  Recent Labs Lab 07/22/16 1820 07/23/16 0553  WBC 11.9* 11.9*  HGB 13.8 13.3  HCT 40.2 39.1  PLT 283 289  MCV 91.8 92.4  MCH 31.5 31.4  MCHC 34.3 34.0  RDW 15.7* 16.0*  LYMPHSABS 1.1  --   MONOABS 0.9  --   EOSABS 0.1  --   BASOSABS 0.0  --     Chemistries   Recent Labs Lab 07/22/16 1820  07/22/16 2308 07/23/16 0132 07/23/16 0553  NA 144  --  144 145  K 2.2*  --  2.2* 2.2*  CL 110  --  114* 114*  CO2 26  --  20* 21*  GLUCOSE 105*  --  92 80  BUN 17  --  17 17  CREATININE 2.03*  --  1.82* 1.75*  CALCIUM 8.9  --  8.1* 8.2*  MG  --  1.9  --   --   AST 25  --   --   --   ALT 17  --   --   --   ALKPHOS 81  --   --   --   BILITOT 0.6  --   --   --    ------------------------------------------------------------------------------------------------------------------ No results for input(s): CHOL, HDL, LDLCALC, TRIG, CHOLHDL, LDLDIRECT in the last 72 hours.  Lab Results  Component Value Date   HGBA1C 5.9 (H) 11/30/2015   ------------------------------------------------------------------------------------------------------------------ No results for input(s): TSH, T4TOTAL,  T3FREE, THYROIDAB in the last 72 hours.  Invalid input(s): FREET3 ------------------------------------------------------------------------------------------------------------------ No results for input(s): VITAMINB12, FOLATE, FERRITIN, TIBC, IRON, RETICCTPCT in the last 72 hours.  Coagulation profile No results for input(s): INR, PROTIME in the last 168 hours.  No results for input(s): DDIMER in the last 72 hours.  Cardiac Enzymes  Recent Labs Lab 07/22/16 2030 07/23/16 0132 07/23/16 0553  TROPONINI 0.04* 0.04* 0.06*   ------------------------------------------------------------------------------------------------------------------ No results found for: BNP  Micro Results Recent Results (from the past 240 hour(s))  MRSA PCR Screening     Status: None   Collection Time: 07/22/16 11:35 PM  Result Value Ref Range Status   MRSA by PCR NEGATIVE NEGATIVE Final    Comment:        The GeneXpert MRSA Assay (FDA approved for NASAL specimens only), is one component of a comprehensive MRSA colonization surveillance program. It is not intended to diagnose MRSA infection nor to guide or monitor treatment for MRSA infections.     Radiology Reports Dg Chest 2 View  Result Date: 07/22/2016 CLINICAL DATA:  Syncopal episode. EXAM: CHEST  2 VIEW COMPARISON:  11/30/2015 FINDINGS: AP and lateral views of the chest show left base atelectasis or infiltrate. Right lung clear. Cardiopericardial silhouette is at upper limits of normal for size. The visualized bony structures of the thorax are intact. Old right clavicle fracture noted. Telemetry leads overlie the chest. IMPRESSION: Minimal left base atelectasis or infiltrate. Electronically Signed   By: Misty Stanley M.D.   On: 07/22/2016 18:43   Ct Head Wo Contrast  Result Date: 07/22/2016 CLINICAL DATA:  Patient fell at home.  Abrasions to nose. EXAM: CT HEAD WITHOUT CONTRAST CT CERVICAL SPINE WITHOUT CONTRAST TECHNIQUE: Multidetector CT  imaging of the head and cervical spine was performed following the standard protocol without intravenous contrast. Multiplanar CT image reconstructions of the cervical spine were also generated. COMPARISON:  Head CT 11/30/2015 cervical spine CT 09/05/2014. FINDINGS: CT HEAD FINDINGS Brain: Stable appearance old left inferior cerebellar infarct. There is no evidence for acute hemorrhage, hydrocephalus, mass lesion, or abnormal extra-axial fluid collection. No definite CT evidence for acute infarction. Lacunar infarcts noted in the basal ganglia bilaterally . Vascular: Atherosclerotic calcification is visualized in the carotid arteries. No dense MCA sign. Major dural sinuses are unremarkable. Skull: No evidence for fracture. No worrisome lytic or sclerotic lesion. Sinuses/Orbits: The visualized paranasal sinuses and mastoid air cells are clear. Visualized portions of the globes and intraorbital fat are unremarkable. Other: None. CT CERVICAL  SPINE FINDINGS Alignment: Straightening of the normal cervical lordosis evident. No subluxation. Skull base and vertebrae: No evidence of fracture from the skullbase through the T1 vertebral body. Soft tissues and spinal canal: No prevertebral fluid or swelling. No visible canal hematoma. Multiple surgical clips noted left neck. Disc levels: Loss of disc height seen at C5-6. Facets are well aligned bilaterally. Upper chest: Negative. Other: None. IMPRESSION: 1. Stable head CT. Old left inferior cerebellar infarct without new or acute intracranial abnormality. 2.  Degenerative changes C5-6.  No cervical spine fracture. 3. Loss of cervical lordosis. This can be related to patient positioning, muscle spasm or soft tissue injury. Electronically Signed   By: Misty Stanley M.D.   On: 07/22/2016 19:32   Ct Cervical Spine Wo Contrast  Result Date: 07/22/2016 CLINICAL DATA:  Patient fell at home.  Abrasions to nose. EXAM: CT HEAD WITHOUT CONTRAST CT CERVICAL SPINE WITHOUT CONTRAST  TECHNIQUE: Multidetector CT imaging of the head and cervical spine was performed following the standard protocol without intravenous contrast. Multiplanar CT image reconstructions of the cervical spine were also generated. COMPARISON:  Head CT 11/30/2015 cervical spine CT 09/05/2014. FINDINGS: CT HEAD FINDINGS Brain: Stable appearance old left inferior cerebellar infarct. There is no evidence for acute hemorrhage, hydrocephalus, mass lesion, or abnormal extra-axial fluid collection. No definite CT evidence for acute infarction. Lacunar infarcts noted in the basal ganglia bilaterally . Vascular: Atherosclerotic calcification is visualized in the carotid arteries. No dense MCA sign. Major dural sinuses are unremarkable. Skull: No evidence for fracture. No worrisome lytic or sclerotic lesion. Sinuses/Orbits: The visualized paranasal sinuses and mastoid air cells are clear. Visualized portions of the globes and intraorbital fat are unremarkable. Other: None. CT CERVICAL SPINE FINDINGS Alignment: Straightening of the normal cervical lordosis evident. No subluxation. Skull base and vertebrae: No evidence of fracture from the skullbase through the T1 vertebral body. Soft tissues and spinal canal: No prevertebral fluid or swelling. No visible canal hematoma. Multiple surgical clips noted left neck. Disc levels: Loss of disc height seen at C5-6. Facets are well aligned bilaterally. Upper chest: Negative. Other: None. IMPRESSION: 1. Stable head CT. Old left inferior cerebellar infarct without new or acute intracranial abnormality. 2.  Degenerative changes C5-6.  No cervical spine fracture. 3. Loss of cervical lordosis. This can be related to patient positioning, muscle spasm or soft tissue injury. Electronically Signed   By: Misty Stanley M.D.   On: 07/22/2016 19:32   Mr Brain Wo Contrast  Result Date: 07/22/2016 CLINICAL DATA:  Initial evaluation for weakness, multiple falls. History of prior stroke. EXAM: MRI HEAD  WITHOUT CONTRAST TECHNIQUE: Multiplanar, multiecho pulse sequences of the brain and surrounding structures were obtained without intravenous contrast. COMPARISON:  Prior CT from earlier the same day. FINDINGS: Brain: Study moderately degraded by motion artifact. Mild diffuse prominence of the CSF containing spaces is compatible with generalized cerebral atrophy. Patchy and confluent T2/FLAIR hyperintensity within the periventricular and deep white matter both cerebral hemispheres most compatible chronic vessel ischemic disease, mild to moderate nature. Chronic microvascular ischemic changes present within the pons as well. There are several superimposed small remote lacunar infarcts within the left basal ganglia. Small focus of encephalomalacia with gliosis within the cortical gray matter of the anterior left frontal lobe also compatible with a small remote infarct (series 9, image 16). Large remote left cerebellar infarct noted. There is a punctate 4 mm focus of diffusion abnormality within the cortical gray matter of the left parietal lobe (series 3, image  94), suspicious for possible small acute/early subacute cortical infarct. No associated hemorrhage. No other evidence for acute or subacute ischemia. Gray-white matter differentiation otherwise maintained. No evidence for acute or chronic intracranial hemorrhage. No mass lesion, midline shift or mass effect. No hydrocephalus. No extra-axial fluid collection. Major dural sinuses are grossly patent. Pituitary gland not well evaluated on this motion degraded study. Vascular: Left vertebral artery is CED small/hypoplastic, and possibly occluded. Major intracranial vascular flow voids otherwise maintained. Skull and upper cervical spine: Craniocervical junction within normal limits. Visualized upper cervical spine unremarkable. Bone marrow signal intensity within normal limits. No scalp soft tissue abnormality. Sinuses/Orbits: Globes and orbital soft tissues within  normal limits. Mild scattered mucosal thickening within the ethmoidal air cells and maxillary sinuses. No air-fluid level to suggest active sinus infection. No significant mastoid effusion. Inner ear structures grossly normal. IMPRESSION: 1. Punctate 4 mm focus of diffusion abnormality within the cortical gray matter of the left parietal lobe, suspicious for tiny acute/early subacute ischemic infarct. No associated hemorrhage. 2. No other acute intracranial process identified. 3. Large remote left cerebellar infarct, with additional small remote left frontal cortical infarct and remote lacunar infarcts within the left basal ganglia. 4. Mild to moderate chronic microvascular ischemic disease. Electronically Signed   By: Jeannine Boga M.D.   On: 07/22/2016 21:54    Time Spent in minutes  30   SINGH,PRASHANT K M.D on 07/23/2016 at 8:29 AM  Between 7am to 7pm - Pager - [phone]  After 7pm go to [email or web address] - password [email or web address]  [facility] -  Office  [phone]

## 2016-07-24 LAB — BASIC METABOLIC PANEL
Anion gap: 6 (ref 5–15)
BUN: 15 mg/dL (ref 6–20)
CHLORIDE: 116 mmol/L — AB (ref 101–111)
CO2: 19 mmol/L — ABNORMAL LOW (ref 22–32)
CREATININE: 1.28 mg/dL — AB (ref 0.61–1.24)
Calcium: 7.5 mg/dL — ABNORMAL LOW (ref 8.9–10.3)
GFR calc Af Amer: >60 mL/min (ref >60)
GFR calc non Af Amer: 59 mL/min — ABNORMAL LOW (ref >60)
GLUCOSE: 121 mg/dL — AB (ref 65–99)
POTASSIUM: 2.5 mmol/L — AB (ref 3.5–5.1)
SODIUM: 141 mmol/L (ref 135–145)

## 2016-07-24 LAB — MAGNESIUM: MAGNESIUM: 1.5 mg/dL — AB (ref 1.7–2.4)

## 2016-07-24 MED ORDER — MAGNESIUM SULFATE 50 % IJ SOLN: 4.0000 g | Freq: Once | INTRAMUSCULAR | Status: DC

## 2016-07-24 MED ORDER — POTASSIUM CHLORIDE 2 MEQ/ML IV SOLN
INTRAVENOUS | Status: DC
  Administered 2016-07-24 – 2016-07-25 (×3): via INTRAVENOUS
  Filled 2016-07-24 (×7): qty 1000

## 2016-07-24 MED ORDER — MAGNESIUM SULFATE 2 GM/50ML IV SOLN
2.0000 g | INTRAVENOUS | Status: AC
  Administered 2016-07-24 (×2): 2 g via INTRAVENOUS
  Filled 2016-07-24 (×2): qty 50

## 2016-07-24 MED ORDER — DEXMEDETOMIDINE HCL IN NACL 200 MCG/50ML IV SOLN
0.4000 ug/kg/h | INTRAVENOUS | Status: DC
  Administered 2016-07-24 (×3): 1.2 ug/kg/h via INTRAVENOUS
  Administered 2016-07-24: 0.4 ug/kg/h via INTRAVENOUS
  Administered 2016-07-24: 1.1 ug/kg/h via INTRAVENOUS
  Administered 2016-07-25 (×6): 1.2 ug/kg/h via INTRAVENOUS
  Administered 2016-07-25: 1.8 ug/kg/h via INTRAVENOUS
  Administered 2016-07-25 (×2): 1.2 ug/kg/h via INTRAVENOUS
  Administered 2016-07-25: 1.8 ug/kg/h via INTRAVENOUS
  Administered 2016-07-25: 1.2 ug/kg/h via INTRAVENOUS
  Administered 2016-07-26 (×7): 1.8 ug/kg/h via INTRAVENOUS
  Filled 2016-07-24 (×2): qty 100
  Filled 2016-07-24 (×14): qty 50
  Filled 2016-07-24 (×2): qty 100
  Filled 2016-07-24: qty 50

## 2016-07-24 MED ORDER — SODIUM CHLORIDE 0.9 % IV SOLN
INTRAVENOUS | Status: AC
  Administered 2016-07-24: 08:00:00 via INTRAVENOUS

## 2016-07-24 MED ORDER — POTASSIUM CHLORIDE 10 MEQ/100ML IV SOLN
10.0000 meq | INTRAVENOUS | Status: AC
  Administered 2016-07-24 (×6): 10 meq via INTRAVENOUS
  Filled 2016-07-24 (×5): qty 100

## 2016-07-24 MED ORDER — LORAZEPAM 2 MG/ML IJ SOLN
2.0000 mg | INTRAMUSCULAR | Status: DC | PRN
  Administered 2016-07-24 – 2016-07-27 (×17): 4 mg via INTRAVENOUS
  Filled 2016-07-24 (×16): qty 2

## 2016-07-24 MED ORDER — LORAZEPAM 2 MG/ML IJ SOLN
INTRAMUSCULAR | Status: AC
  Administered 2016-07-24: 14:00:00
  Filled 2016-07-24: qty 1

## 2016-07-24 MED ORDER — LORAZEPAM 2 MG/ML IJ SOLN
2.0000 mg | Freq: Once | INTRAMUSCULAR | Status: AC
  Administered 2016-07-24: 2 mg via INTRAVENOUS

## 2016-07-24 MED ORDER — LORAZEPAM 2 MG/ML IJ SOLN
1.0000 mg | INTRAMUSCULAR | Status: DC | PRN
  Administered 2016-07-26: 2 mg via INTRAVENOUS
  Filled 2016-07-24 (×2): qty 1

## 2016-07-24 MED ORDER — CHLORDIAZEPOXIDE HCL 5 MG PO CAPS
10.0000 mg | ORAL_CAPSULE | Freq: Three times a day (TID) | ORAL | Status: DC
  Administered 2016-07-24 – 2016-07-26 (×5): 10 mg via ORAL
  Filled 2016-07-24 (×5): qty 2

## 2016-07-24 MED ORDER — POTASSIUM CHLORIDE CRYS ER 20 MEQ PO TBCR
60.0000 meq | EXTENDED_RELEASE_TABLET | Freq: Four times a day (QID) | ORAL | Status: AC
  Administered 2016-07-24 (×2): 60 meq via ORAL
  Filled 2016-07-24: qty 6
  Filled 2016-07-24: qty 3

## 2016-07-24 MED ORDER — MAGNESIUM SULFATE 50 % IJ SOLN: 2.0000 g | Freq: Once | INTRAMUSCULAR | Status: DC

## 2016-07-24 MED ORDER — DEXMEDETOMIDINE HCL IN NACL 200 MCG/50ML IV SOLN
INTRAVENOUS | Status: AC
  Administered 2016-07-24: 0.4 ug/kg/h via INTRAVENOUS
  Filled 2016-07-24: qty 50

## 2016-07-24 MED ORDER — POTASSIUM CHLORIDE CRYS ER 20 MEQ PO TBCR: 40.0000 meq | EXTENDED_RELEASE_TABLET | Freq: Four times a day (QID) | ORAL | Status: DC

## 2016-07-24 NOTE — Progress Notes (Addendum)
PROGRESS NOTE                                                                                                                                                                                                             Patient Demographics:    George Valdez, is a 61 y.o. male, DOB - 03-21-1956, [id number]  Admit date - 07/22/2016   Admitting Physician Phillips Grout, MD  Outpatient Primary MD for the patient is Jana Half  LOS - 2  Chief Complaint  Patient presents with  . Loss of Consciousness  . Bradycardia       Brief Narrative  George Valdez is a 61 y.o. male with medical history significant of previous CVA with left sided weakness, etoh abuse last drink 12 days ago, chronic pain, HTN, CKD 4, CAD , COPD comes in with frequent falls and says he is weaker than normal. In the ER he was found to be severely dehydrated, was severely hypokalemic and wasn't ARF.   Subjective:    [name] today has, No headache, No chest pain, No abdominal pain - No Nausea, No new weakness tingling or numbness, No Cough - SOB.     Assessment  & Plan :     1.Severe dehydration, ARF with severe hyperkalemia & hypomagnesemia . This is from poor oral intake, Replaced potassium & Mag aggressively, hydrate with IV fluids, monitor electrolytes closely.  2. ARF on CK D4. Baseline creatinine close to 1.7. Due to dehydration and being on ACE inhibitor, stop ACE, hydrate and monitor. Recent ultrasound noted showing medical renal disease.  3. Ongoing alcohol abuse. He will go in DTs, continue CIWA protocol place him on low-dose Librium. Counseled to quit alcohol, stable B12. He denied any recent Alcohol use ( doubt it).  4. CAD/PAD. Chest pain free. Troponin trend flat and non-ACS, continue aspirin, statin, Plavix, Imdur, low-dose beta blocker once blood pressure stabilizes. No acute issues.  5. History of  CVA with mild left-sided weakness. Supportive care. Continue dual antiplatelet therapy and statin for secondary prevention.    Addendum - 3pm 07-24-16 - now in full blown DTs.    Family Communication  :  friend  Code Status :  Full  Diet : Diet full liquid Room service appropriate? Yes; Fluid consistency: Thin   Disposition Plan  :  Home  once electrolytes are stable  Consults  :  None  Procedures  :    CT scan of head and C-spine. Nonacute  DVT Prophylaxis  :   Heparin  Lab Results  Component Value Date   PLT 289 07/23/2016    Inpatient Medications  Scheduled Meds: . aspirin EC  81 mg Oral Daily  . clopidogrel  75 mg Oral Daily  . cyanocobalamin  1,000 mcg Intramuscular Q30 days  . DULoxetine  60 mg Oral Daily  . feeding supplement (PRO-STAT SUGAR FREE 64)  30 mL Oral TID WC  . folic acid  1 mg Oral Daily  . heparin subcutaneous  5,000 Units Subcutaneous Q8H  . Influenza vac split quadrivalent PF  0.5 mL Intramuscular Tomorrow-1000  . isosorbide mononitrate  10 mg Oral Daily  . LORazepam  0-4 mg Intravenous Q6H   Followed by  . [START ON 07/25/2016] LORazepam  0-4 mg Intravenous Q12H  . magnesium sulfate 1 - 4 g bolus IVPB  2 g Intravenous Q1 Hr x 2  . metoprolol succinate  25 mg Oral BID WC  . multivitamin with minerals  1 tablet Oral Daily  . pneumococcal 23 valent vaccine  0.5 mL Intramuscular Tomorrow-1000  . potassium chloride  10 mEq Intravenous Q1 Hr x 6  . potassium chloride  60 mEq Oral Q6H  . rosuvastatin  40 mg Oral q1800  . thiamine  100 mg Oral Daily   Or  . thiamine  100 mg Intravenous Daily  . tiotropium  18 mcg Inhalation Daily   Continuous Infusions: . sodium chloride 125 mL/hr at 07/24/16 0824  . 0.9 % sodium chloride with kcl     PRN Meds:.albuterol, LORazepam **OR** LORazepam  Antibiotics  :    Anti-infectives    None         Objective:   Vitals:   07/24/16 0500 07/24/16 0600 07/24/16 0700 07/24/16 0856  BP: 130/73 (!)  122/105    Pulse: 97 (!) 110 (!) 103   Resp: 14 (!) 22 17   Temp:      TempSrc:      SpO2: 99% 98% 98% 97%  Weight: 71.7 kg (158 lb 1.1 oz)     Height:        Wt Readings from Last 3 Encounters:  07/24/16 71.7 kg (158 lb 1.1 oz)  12/03/15 81.3 kg (179 lb 4.5 oz)  11/16/15 89.8 kg (198 lb)     Intake/Output Summary (Last 24 hours) at 07/24/16 1047 Last data filed at 07/24/16 0600  Gross per 24 hour  Intake          3741.25 ml  Output              453 ml  Net          3288.25 ml     Physical Exam  Awake Alert, Oriented X 2, No new F.N deficits, Normal affect [place].AT,PERRAL, Supple Neck,No JVD, No cervical lymphadenopathy appriciated.  Symmetrical Chest wall movement, Good air movement bilaterally, CTAB RRR,No Gallops,Rubs or new Murmurs, No Parasternal Heave +ve B.Sounds, Abd Soft, No tenderness, No organomegaly appriciated, No rebound - guarding or rigidity. No Cyanosis, Clubbing or edema, No new Rash or bruise       Data Review:    CBC  Recent Labs Lab 07/22/16 1820 07/23/16 0553  WBC 11.9* 11.9*  HGB 13.8 13.3  HCT 40.2 39.1  PLT 283 289  MCV 91.8 92.4  MCH 31.5 31.4  MCHC 34.3  34.0  RDW 15.7* 16.0*  LYMPHSABS 1.1  --   MONOABS 0.9  --   EOSABS 0.1  --   BASOSABS 0.0  --     Chemistries   Recent Labs Lab 07/22/16 1820 07/22/16 2308 07/23/16 0132 07/23/16 0553 07/23/16 1712 07/24/16 0608  NA 144  --  144 145  --  141  K 2.2*  --  2.2* 2.2* 2.5* 2.5*  CL 110  --  114* 114*  --  116*  CO2 26  --  20* 21*  --  19*  GLUCOSE 105*  --  92 80  --  121*  BUN 17  --  17 17  --  15  CREATININE 2.03*  --  1.82* 1.75*  --  1.28*  CALCIUM 8.9  --  8.1* 8.2*  --  7.5*  MG  --  1.9  --   --  1.7 1.5*  AST 25  --   --   --   --   --   ALT 17  --   --   --   --   --   ALKPHOS 81  --   --   --   --   --   BILITOT 0.6  --   --   --   --   --     ------------------------------------------------------------------------------------------------------------------ No results for input(s): CHOL, HDL, LDLCALC, TRIG, CHOLHDL, LDLDIRECT in the last 72 hours.  Lab Results  Component Value Date   HGBA1C 5.9 (H) 11/30/2015   ------------------------------------------------------------------------------------------------------------------ No results for input(s): TSH, T4TOTAL, T3FREE, THYROIDAB in the last 72 hours.  Invalid input(s): FREET3 ------------------------------------------------------------------------------------------------------------------  Recent Labs  07/23/16 0845  VITAMINB12 5,354*    Coagulation profile No results for input(s): INR, PROTIME in the last 168 hours.  No results for input(s): DDIMER in the last 72 hours.  Cardiac Enzymes  Recent Labs Lab 07/23/16 0132 07/23/16 0553 07/23/16 1158  TROPONINI 0.04* 0.06* 0.06*   ------------------------------------------------------------------------------------------------------------------ No results found for: BNP  Micro Results Recent Results (from the past 240 hour(s))  MRSA PCR Screening     Status: None   Collection Time: 07/22/16 11:35 PM  Result Value Ref Range Status   MRSA by PCR NEGATIVE NEGATIVE Final    Comment:        The GeneXpert MRSA Assay (FDA approved for NASAL specimens only), is one component of a comprehensive MRSA colonization surveillance program. It is not intended to diagnose MRSA infection nor to guide or monitor treatment for MRSA infections.     Radiology Reports Dg Chest 2 View  Result Date: 07/22/2016 CLINICAL DATA:  Syncopal episode. EXAM: CHEST  2 VIEW COMPARISON:  11/30/2015 FINDINGS: AP and lateral views of the chest show left base atelectasis or infiltrate. Right lung clear. Cardiopericardial silhouette is at upper limits of normal for size. The visualized bony structures of the thorax are intact. Old right  clavicle fracture noted. Telemetry leads overlie the chest. IMPRESSION: Minimal left base atelectasis or infiltrate. Electronically Signed   By: Misty Stanley M.D.   On: 07/22/2016 18:43   Ct Head Wo Contrast  Result Date: 07/22/2016 CLINICAL DATA:  Patient fell at home.  Abrasions to nose. EXAM: CT HEAD WITHOUT CONTRAST CT CERVICAL SPINE WITHOUT CONTRAST TECHNIQUE: Multidetector CT imaging of the head and cervical spine was performed following the standard protocol without intravenous contrast. Multiplanar CT image reconstructions of the cervical spine were also generated. COMPARISON:  Head CT 11/30/2015 cervical spine CT 09/05/2014. FINDINGS:  CT HEAD FINDINGS Brain: Stable appearance old left inferior cerebellar infarct. There is no evidence for acute hemorrhage, hydrocephalus, mass lesion, or abnormal extra-axial fluid collection. No definite CT evidence for acute infarction. Lacunar infarcts noted in the basal ganglia bilaterally . Vascular: Atherosclerotic calcification is visualized in the carotid arteries. No dense MCA sign. Major dural sinuses are unremarkable. Skull: No evidence for fracture. No worrisome lytic or sclerotic lesion. Sinuses/Orbits: The visualized paranasal sinuses and mastoid air cells are clear. Visualized portions of the globes and intraorbital fat are unremarkable. Other: None. CT CERVICAL SPINE FINDINGS Alignment: Straightening of the normal cervical lordosis evident. No subluxation. Skull base and vertebrae: No evidence of fracture from the skullbase through the T1 vertebral body. Soft tissues and spinal canal: No prevertebral fluid or swelling. No visible canal hematoma. Multiple surgical clips noted left neck. Disc levels: Loss of disc height seen at C5-6. Facets are well aligned bilaterally. Upper chest: Negative. Other: None. IMPRESSION: 1. Stable head CT. Old left inferior cerebellar infarct without new or acute intracranial abnormality. 2.  Degenerative changes C5-6.  No  cervical spine fracture. 3. Loss of cervical lordosis. This can be related to patient positioning, muscle spasm or soft tissue injury. Electronically Signed   By: Misty Stanley M.D.   On: 07/22/2016 19:32   Ct Cervical Spine Wo Contrast  Result Date: 07/22/2016 CLINICAL DATA:  Patient fell at home.  Abrasions to nose. EXAM: CT HEAD WITHOUT CONTRAST CT CERVICAL SPINE WITHOUT CONTRAST TECHNIQUE: Multidetector CT imaging of the head and cervical spine was performed following the standard protocol without intravenous contrast. Multiplanar CT image reconstructions of the cervical spine were also generated. COMPARISON:  Head CT 11/30/2015 cervical spine CT 09/05/2014. FINDINGS: CT HEAD FINDINGS Brain: Stable appearance old left inferior cerebellar infarct. There is no evidence for acute hemorrhage, hydrocephalus, mass lesion, or abnormal extra-axial fluid collection. No definite CT evidence for acute infarction. Lacunar infarcts noted in the basal ganglia bilaterally . Vascular: Atherosclerotic calcification is visualized in the carotid arteries. No dense MCA sign. Major dural sinuses are unremarkable. Skull: No evidence for fracture. No worrisome lytic or sclerotic lesion. Sinuses/Orbits: The visualized paranasal sinuses and mastoid air cells are clear. Visualized portions of the globes and intraorbital fat are unremarkable. Other: None. CT CERVICAL SPINE FINDINGS Alignment: Straightening of the normal cervical lordosis evident. No subluxation. Skull base and vertebrae: No evidence of fracture from the skullbase through the T1 vertebral body. Soft tissues and spinal canal: No prevertebral fluid or swelling. No visible canal hematoma. Multiple surgical clips noted left neck. Disc levels: Loss of disc height seen at C5-6. Facets are well aligned bilaterally. Upper chest: Negative. Other: None. IMPRESSION: 1. Stable head CT. Old left inferior cerebellar infarct without new or acute intracranial abnormality. 2.   Degenerative changes C5-6.  No cervical spine fracture. 3. Loss of cervical lordosis. This can be related to patient positioning, muscle spasm or soft tissue injury. Electronically Signed   By: Misty Stanley M.D.   On: 07/22/2016 19:32   Mr Brain Wo Contrast  Result Date: 07/22/2016 CLINICAL DATA:  Initial evaluation for weakness, multiple falls. History of prior stroke. EXAM: MRI HEAD WITHOUT CONTRAST TECHNIQUE: Multiplanar, multiecho pulse sequences of the brain and surrounding structures were obtained without intravenous contrast. COMPARISON:  Prior CT from earlier the same day. FINDINGS: Brain: Study moderately degraded by motion artifact. Mild diffuse prominence of the CSF containing spaces is compatible with generalized cerebral atrophy. Patchy and confluent T2/FLAIR hyperintensity within the periventricular and  deep white matter both cerebral hemispheres most compatible chronic vessel ischemic disease, mild to moderate nature. Chronic microvascular ischemic changes present within the pons as well. There are several superimposed small remote lacunar infarcts within the left basal ganglia. Small focus of encephalomalacia with gliosis within the cortical gray matter of the anterior left frontal lobe also compatible with a small remote infarct (series 9, image 16). Large remote left cerebellar infarct noted. There is a punctate 4 mm focus of diffusion abnormality within the cortical gray matter of the left parietal lobe (series 3, image 94), suspicious for possible small acute/early subacute cortical infarct. No associated hemorrhage. No other evidence for acute or subacute ischemia. Gray-white matter differentiation otherwise maintained. No evidence for acute or chronic intracranial hemorrhage. No mass lesion, midline shift or mass effect. No hydrocephalus. No extra-axial fluid collection. Major dural sinuses are grossly patent. Pituitary gland not well evaluated on this motion degraded study. Vascular: Left  vertebral artery is CED small/hypoplastic, and possibly occluded. Major intracranial vascular flow voids otherwise maintained. Skull and upper cervical spine: Craniocervical junction within normal limits. Visualized upper cervical spine unremarkable. Bone marrow signal intensity within normal limits. No scalp soft tissue abnormality. Sinuses/Orbits: Globes and orbital soft tissues within normal limits. Mild scattered mucosal thickening within the ethmoidal air cells and maxillary sinuses. No air-fluid level to suggest active sinus infection. No significant mastoid effusion. Inner ear structures grossly normal. IMPRESSION: 1. Punctate 4 mm focus of diffusion abnormality within the cortical gray matter of the left parietal lobe, suspicious for tiny acute/early subacute ischemic infarct. No associated hemorrhage. 2. No other acute intracranial process identified. 3. Large remote left cerebellar infarct, with additional small remote left frontal cortical infarct and remote lacunar infarcts within the left basal ganglia. 4. Mild to moderate chronic microvascular ischemic disease. Electronically Signed   By: Jeannine Boga M.D.   On: 07/22/2016 21:54    Time Spent in minutes  30   Daevon Holdren K M.D on 07/24/2016 at 10:47 AM  Between 7am to 7pm - Pager - [phone]  After 7pm go to [email or web address] - password [facility]  [facility] -  Office  [phone]

## 2016-07-24 NOTE — Progress Notes (Signed)
Dr. Candiss Norse informed about potassium of 2.5.

## 2016-07-25 LAB — HEPATIC FUNCTION PANEL
ALBUMIN: 2.5 g/dL — AB (ref 3.5–5.0)
ALK PHOS: 71 U/L (ref 38–126)
ALT: 19 U/L (ref 17–63)
AST: 28 U/L (ref 15–41)
BILIRUBIN TOTAL: 0.5 mg/dL (ref 0.3–1.2)
Bilirubin, Direct: 0.2 mg/dL (ref 0.1–0.5)
Indirect Bilirubin: 0.3 mg/dL (ref 0.3–0.9)
Total Protein: 5.6 g/dL — ABNORMAL LOW (ref 6.5–8.1)

## 2016-07-25 LAB — BASIC METABOLIC PANEL
ANION GAP: 5 (ref 5–15)
BUN: 12 mg/dL (ref 6–20)
CO2: 19 mmol/L — AB (ref 22–32)
Calcium: 7 mg/dL — ABNORMAL LOW (ref 8.9–10.3)
Chloride: 120 mmol/L — ABNORMAL HIGH (ref 101–111)
Creatinine, Ser: 0.98 mg/dL (ref 0.61–1.24)
GFR calc Af Amer: >60 mL/min (ref >60)
Glucose, Bld: 127 mg/dL — ABNORMAL HIGH (ref 65–99)
POTASSIUM: 4.3 mmol/L (ref 3.5–5.1)
Sodium: 144 mmol/L (ref 135–145)

## 2016-07-25 LAB — MAGNESIUM: Magnesium: 2 mg/dL (ref 1.7–2.4)

## 2016-07-25 MED ORDER — POTASSIUM CL IN DEXTROSE 5% 20 MEQ/L IV SOLN
20.0000 meq | INTRAVENOUS | Status: DC
  Administered 2016-07-25 – 2016-07-26 (×3): 20 meq via INTRAVENOUS
  Filled 2016-07-25 (×6): qty 1000

## 2016-07-25 NOTE — Progress Notes (Signed)
PROGRESS NOTE                                                                                                                                                                                                             Patient Demographics:    George Valdez, is a 61 y.o. male, DOB - 1956/05/03, [id number]  Admit date - 07/22/2016   Admitting Physician Phillips Grout, MD  Outpatient Primary MD for the patient is Jana Half  LOS - 3  Chief Complaint  Patient presents with  . Loss of Consciousness  . Bradycardia       Brief Narrative  George Valdez is a 61 y.o. male with medical history significant of previous CVA with left sided weakness, etoh abuse last drink 12 days ago, chronic pain, HTN, CKD 4, CAD , COPD comes in with frequent falls and says he is weaker than normal. In the ER he was found to be severely dehydrated, was severely hypokalemic and wasn't ARF.During the hospitalization he went into full blown DTs.   Subjective:    George Valdez today Is very confused due to being in DTs, unable to answer questions or follow commands.     Assessment  & Plan :     1.Severe dehydration, ARF with severe hyperkalemia & hypomagnesemia . This is from poor oral intake, Replaced potassium & Mag aggressively, All stable now, ARF resolved, monitor electrolytes closely.  2. ARF on CK D4. Baseline creatinine close to 1.7. Due to dehydration and being on ACE inhibitor, stop ACE, hydrate and monitor. Recent ultrasound noted showing medical renal disease.  3. Ongoing alcohol abuse. She denied upon admission and the next 2 days, now in full blown DTs, currently on CIWA protocol along with present extremity, was already counseled extensively to quit alcohol in the presence of his drinking buddy next day after admission.  4. CAD/PAD. Chest pain free. Troponin trend flat and non-ACS, continue aspirin,  statin, Plavix, Imdur, low-dose beta blocker once blood pressure stabilizes. No acute issues.  5. History of CVA with mild left-sided weakness. Supportive care. Continue dual antiplatelet therapy and statin for secondary prevention.    Addendum - 3pm 07-24-16 - now in full blown DTs.    Family Communication  :  friend  Code Status :  Full  Diet : Diet NPO time specified  Except for: Sips with Meds   Disposition Plan  :  Home once DTs have resolved  Consults  :  None  Procedures  :    CT scan of head and C-spine. Nonacute  DVT Prophylaxis  :   Heparin  Lab Results  Component Value Date   PLT 289 07/23/2016    Inpatient Medications  Scheduled Meds: . aspirin EC  81 mg Oral Daily  . chlordiazePOXIDE  10 mg Oral TID  . clopidogrel  75 mg Oral Daily  . cyanocobalamin  1,000 mcg Intramuscular Q30 days  . DULoxetine  60 mg Oral Daily  . feeding supplement (PRO-STAT SUGAR FREE 64)  30 mL Oral TID WC  . folic acid  1 mg Oral Daily  . heparin subcutaneous  5,000 Units Subcutaneous Q8H  . Influenza vac split quadrivalent PF  0.5 mL Intramuscular Tomorrow-1000  . isosorbide mononitrate  10 mg Oral Daily  . LORazepam  0-4 mg Intravenous Q12H  . metoprolol succinate  25 mg Oral BID WC  . multivitamin with minerals  1 tablet Oral Daily  . pneumococcal 23 valent vaccine  0.5 mL Intramuscular Tomorrow-1000  . rosuvastatin  40 mg Oral q1800  . thiamine  100 mg Oral Daily   Or  . thiamine  100 mg Intravenous Daily  . tiotropium  18 mcg Inhalation Daily   Continuous Infusions: . dexmedetomidine 1.2 mcg/kg/hr (07/25/16 0932)  . 0.9 % sodium chloride with kcl 125 mL/hr at 07/25/16 0930   PRN Meds:.albuterol, LORazepam, LORazepam  Antibiotics  :    Anti-infectives    None         Objective:   Vitals:   07/25/16 0542 07/25/16 0600 07/25/16 0733 07/25/16 0809  BP: 117/84     Pulse: 62 61 85   Resp: 18 17 (!) 22   Temp:   97.6 F (36.4 C)   TempSrc:   Axillary     SpO2: 97% 98% 100% 100%  Weight:      Height:        Wt Readings from Last 3 Encounters:  07/25/16 71.7 kg (158 lb 1.1 oz)  12/03/15 81.3 kg (179 lb 4.5 oz)  11/16/15 89.8 kg (198 lb)     Intake/Output Summary (Last 24 hours) at 07/25/16 1023 Last data filed at 07/25/16 0651  Gross per 24 hour  Intake          1926.89 ml  Output             2550 ml  Net          -623.11 ml     Physical Exam  Awake But confused, in DTs, moving all 4 extremities by himself George Valdez.AT,PERRAL, Supple Neck,No JVD, No cervical lymphadenopathy appriciated.  Symmetrical Chest wall movement, Good air movement bilaterally, CTAB RRR,No Gallops,Rubs or new Murmurs, No Parasternal Heave +ve B.Sounds, Abd Soft, No tenderness, No organomegaly appriciated, No rebound - guarding or rigidity. No Cyanosis, Clubbing or edema, No new Rash or bruise       Data Review:    CBC  Recent Labs Lab 07/22/16 1820 07/23/16 0553  WBC 11.9* 11.9*  HGB 13.8 13.3  HCT 40.2 39.1  PLT 283 289  MCV 91.8 92.4  MCH 31.5 31.4  MCHC 34.3 34.0  RDW 15.7* 16.0*  LYMPHSABS 1.1  --   MONOABS 0.9  --   EOSABS 0.1  --   BASOSABS 0.0  --     Chemistries   Recent Labs Lab  07/22/16 1820 07/22/16 2308 07/23/16 0132 07/23/16 0553 07/23/16 1712 07/24/16 0608 07/25/16 0500  NA 144  --  144 145  --  141 144  K 2.2*  --  2.2* 2.2* 2.5* 2.5* 4.3  CL 110  --  114* 114*  --  116* 120*  CO2 26  --  20* 21*  --  19* 19*  GLUCOSE 105*  --  92 80  --  121* 127*  BUN 17  --  17 17  --  15 12  CREATININE 2.03*  --  1.82* 1.75*  --  1.28* 0.98  CALCIUM 8.9  --  8.1* 8.2*  --  7.5* 7.0*  MG  --  1.9  --   --  1.7 1.5* 2.0  AST 25  --   --   --   --   --  28  ALT 17  --   --   --   --   --  19  ALKPHOS 81  --   --   --   --   --  71  BILITOT 0.6  --   --   --   --   --  0.5   ------------------------------------------------------------------------------------------------------------------ No results for input(s): CHOL, HDL,  LDLCALC, TRIG, CHOLHDL, LDLDIRECT in the last 72 hours.  Lab Results  Component Value Date   HGBA1C 5.9 (H) 11/30/2015   ------------------------------------------------------------------------------------------------------------------ No results for input(s): TSH, T4TOTAL, T3FREE, THYROIDAB in the last 72 hours.  Invalid input(s): FREET3 ------------------------------------------------------------------------------------------------------------------  Recent Labs  07/23/16 0845  VITAMINB12 5,354*    Coagulation profile No results for input(s): INR, PROTIME in the last 168 hours.  No results for input(s): DDIMER in the last 72 hours.  Cardiac Enzymes  Recent Labs Lab 07/23/16 0132 07/23/16 0553 07/23/16 1158  TROPONINI 0.04* 0.06* 0.06*   ------------------------------------------------------------------------------------------------------------------ No results found for: BNP  Micro Results Recent Results (from the past 240 hour(s))  MRSA PCR Screening     Status: None   Collection Time: 07/22/16 11:35 PM  Result Value Ref Range Status   MRSA by PCR NEGATIVE NEGATIVE Final    Comment:        The GeneXpert MRSA Assay (FDA approved for NASAL specimens only), is one component of a comprehensive MRSA colonization surveillance program. It is not intended to diagnose MRSA infection nor to guide or monitor treatment for MRSA infections.     Radiology Reports Dg Chest 2 View  Result Date: 07/22/2016 CLINICAL DATA:  Syncopal episode. EXAM: CHEST  2 VIEW COMPARISON:  11/30/2015 FINDINGS: AP and lateral views of the chest show left base atelectasis or infiltrate. Right lung clear. Cardiopericardial silhouette is at upper limits of normal for size. The visualized bony structures of the thorax are intact. Old right clavicle fracture noted. Telemetry leads overlie the chest. IMPRESSION: Minimal left base atelectasis or infiltrate. Electronically Signed   By: Misty Stanley  M.D.   On: 07/22/2016 18:43   Ct Head Wo Contrast  Result Date: 07/22/2016 CLINICAL DATA:  Patient fell at home.  Abrasions to nose. EXAM: CT HEAD WITHOUT CONTRAST CT CERVICAL SPINE WITHOUT CONTRAST TECHNIQUE: Multidetector CT imaging of the head and cervical spine was performed following the standard protocol without intravenous contrast. Multiplanar CT image reconstructions of the cervical spine were also generated. COMPARISON:  Head CT 11/30/2015 cervical spine CT 09/05/2014. FINDINGS: CT HEAD FINDINGS Brain: Stable appearance old left inferior cerebellar infarct. There is no evidence for acute hemorrhage, hydrocephalus, mass lesion, or abnormal  extra-axial fluid collection. No definite CT evidence for acute infarction. Lacunar infarcts noted in the basal ganglia bilaterally . Vascular: Atherosclerotic calcification is visualized in the carotid arteries. No dense MCA sign. Major dural sinuses are unremarkable. Skull: No evidence for fracture. No worrisome lytic or sclerotic lesion. Sinuses/Orbits: The visualized paranasal sinuses and mastoid air cells are clear. Visualized portions of the globes and intraorbital fat are unremarkable. Other: None. CT CERVICAL SPINE FINDINGS Alignment: Straightening of the normal cervical lordosis evident. No subluxation. Skull base and vertebrae: No evidence of fracture from the skullbase through the T1 vertebral body. Soft tissues and spinal canal: No prevertebral fluid or swelling. No visible canal hematoma. Multiple surgical clips noted left neck. Disc levels: Loss of disc height seen at C5-6. Facets are well aligned bilaterally. Upper chest: Negative. Other: None. IMPRESSION: 1. Stable head CT. Old left inferior cerebellar infarct without new or acute intracranial abnormality. 2.  Degenerative changes C5-6.  No cervical spine fracture. 3. Loss of cervical lordosis. This can be related to patient positioning, muscle spasm or soft tissue injury. Electronically Signed   By:  Misty Stanley M.D.   On: 07/22/2016 19:32   Ct Cervical Spine Wo Contrast  Result Date: 07/22/2016 CLINICAL DATA:  Patient fell at home.  Abrasions to nose. EXAM: CT HEAD WITHOUT CONTRAST CT CERVICAL SPINE WITHOUT CONTRAST TECHNIQUE: Multidetector CT imaging of the head and cervical spine was performed following the standard protocol without intravenous contrast. Multiplanar CT image reconstructions of the cervical spine were also generated. COMPARISON:  Head CT 11/30/2015 cervical spine CT 09/05/2014. FINDINGS: CT HEAD FINDINGS Brain: Stable appearance old left inferior cerebellar infarct. There is no evidence for acute hemorrhage, hydrocephalus, mass lesion, or abnormal extra-axial fluid collection. No definite CT evidence for acute infarction. Lacunar infarcts noted in the basal ganglia bilaterally . Vascular: Atherosclerotic calcification is visualized in the carotid arteries. No dense MCA sign. Major dural sinuses are unremarkable. Skull: No evidence for fracture. No worrisome lytic or sclerotic lesion. Sinuses/Orbits: The visualized paranasal sinuses and mastoid air cells are clear. Visualized portions of the globes and intraorbital fat are unremarkable. Other: None. CT CERVICAL SPINE FINDINGS Alignment: Straightening of the normal cervical lordosis evident. No subluxation. Skull base and vertebrae: No evidence of fracture from the skullbase through the T1 vertebral body. Soft tissues and spinal canal: No prevertebral fluid or swelling. No visible canal hematoma. Multiple surgical clips noted left neck. Disc levels: Loss of disc height seen at C5-6. Facets are well aligned bilaterally. Upper chest: Negative. Other: None. IMPRESSION: 1. Stable head CT. Old left inferior cerebellar infarct without new or acute intracranial abnormality. 2.  Degenerative changes C5-6.  No cervical spine fracture. 3. Loss of cervical lordosis. This can be related to patient positioning, muscle spasm or soft tissue injury.  Electronically Signed   By: Misty Stanley M.D.   On: 07/22/2016 19:32   Mr Brain Wo Contrast  Result Date: 07/22/2016 CLINICAL DATA:  Initial evaluation for weakness, multiple falls. History of prior stroke. EXAM: MRI HEAD WITHOUT CONTRAST TECHNIQUE: Multiplanar, multiecho pulse sequences of the brain and surrounding structures were obtained without intravenous contrast. COMPARISON:  Prior CT from earlier the same day. FINDINGS: Brain: Study moderately degraded by motion artifact. Mild diffuse prominence of the CSF containing spaces is compatible with generalized cerebral atrophy. Patchy and confluent T2/FLAIR hyperintensity within the periventricular and deep white matter both cerebral hemispheres most compatible chronic vessel ischemic disease, mild to moderate nature. Chronic microvascular ischemic changes present within the  pons as well. There are several superimposed small remote lacunar infarcts within the left basal ganglia. Small focus of encephalomalacia with gliosis within the cortical gray matter of the anterior left frontal lobe also compatible with a small remote infarct (series 9, image 16). Large remote left cerebellar infarct noted. There is a punctate 4 mm focus of diffusion abnormality within the cortical gray matter of the left parietal lobe (series 3, image 94), suspicious for possible small acute/early subacute cortical infarct. No associated hemorrhage. No other evidence for acute or subacute ischemia. Gray-white matter differentiation otherwise maintained. No evidence for acute or chronic intracranial hemorrhage. No mass lesion, midline shift or mass effect. No hydrocephalus. No extra-axial fluid collection. Major dural sinuses are grossly patent. Pituitary gland not well evaluated on this motion degraded study. Vascular: Left vertebral artery is CED small/hypoplastic, and possibly occluded. Major intracranial vascular flow voids otherwise maintained. Skull and upper cervical spine:  Craniocervical junction within normal limits. Visualized upper cervical spine unremarkable. Bone marrow signal intensity within normal limits. No scalp soft tissue abnormality. Sinuses/Orbits: Globes and orbital soft tissues within normal limits. Mild scattered mucosal thickening within the ethmoidal air cells and maxillary sinuses. No air-fluid level to suggest active sinus infection. No significant mastoid effusion. Inner ear structures grossly normal. IMPRESSION: 1. Punctate 4 mm focus of diffusion abnormality within the cortical gray matter of the left parietal lobe, suspicious for tiny acute/early subacute ischemic infarct. No associated hemorrhage. 2. No other acute intracranial process identified. 3. Large remote left cerebellar infarct, with additional small remote left frontal cortical infarct and remote lacunar infarcts within the left basal ganglia. 4. Mild to moderate chronic microvascular ischemic disease. Electronically Signed   By: Jeannine Boga M.D.   On: 07/22/2016 21:54    Time Spent in minutes  30   SINGH,PRASHANT K M.D on 07/25/2016 at 10:23 AM  Between 7am to 7pm - Pager - [phone]  After 7pm go to [email or web address] - password [facility]  [facility] -  Office  [phone]

## 2016-07-25 NOTE — Progress Notes (Signed)
eLink Physician-Brief Progress Note Patient Name: George Valdez DOB: Oct 18, 1955 MRN: [id number]   Date of Service  07/25/2016  HPI/Events of Note  Alcohol withdrawal on Precedex, Ativan. Patient is still agitated   eICU Interventions  Increase ceiling on Precedex to 1.8      Intervention Category Intermediate Interventions: Change in mental status - evaluation and management  Makaila Windle 07/25/2016, 8:34 PM

## 2016-07-26 LAB — COMPREHENSIVE METABOLIC PANEL
ALT: 22 U/L (ref 17–63)
AST: 26 U/L (ref 15–41)
Albumin: 2.9 g/dL — ABNORMAL LOW (ref 3.5–5.0)
Alkaline Phosphatase: 83 U/L (ref 38–126)
Anion gap: 6 (ref 5–15)
BILIRUBIN TOTAL: 0.9 mg/dL (ref 0.3–1.2)
BUN: 8 mg/dL (ref 6–20)
CO2: 21 mmol/L — ABNORMAL LOW (ref 22–32)
CREATININE: 0.99 mg/dL (ref 0.61–1.24)
Calcium: 7.6 mg/dL — ABNORMAL LOW (ref 8.9–10.3)
Chloride: 108 mmol/L (ref 101–111)
GFR calc Af Amer: >60 mL/min (ref >60)
GFR calc non Af Amer: >60 mL/min (ref >60)
Glucose, Bld: 113 mg/dL — ABNORMAL HIGH (ref 65–99)
Potassium: 3.2 mmol/L — ABNORMAL LOW (ref 3.5–5.1)
Sodium: 135 mmol/L (ref 135–145)
TOTAL PROTEIN: 6.3 g/dL — AB (ref 6.5–8.1)

## 2016-07-26 LAB — BASIC METABOLIC PANEL
ANION GAP: 6 (ref 5–15)
BUN: 8 mg/dL (ref 6–20)
CALCIUM: 7.3 mg/dL — AB (ref 8.9–10.3)
CO2: 20 mmol/L — ABNORMAL LOW (ref 22–32)
Chloride: 109 mmol/L (ref 101–111)
Creatinine, Ser: 0.81 mg/dL (ref 0.61–1.24)
GLUCOSE: 126 mg/dL — AB (ref 65–99)
POTASSIUM: 3.1 mmol/L — AB (ref 3.5–5.1)
SODIUM: 135 mmol/L (ref 135–145)

## 2016-07-26 LAB — CBC
HEMATOCRIT: 34.7 % — AB (ref 39.0–52.0)
HEMOGLOBIN: 12.2 g/dL — AB (ref 13.0–17.0)
MCH: 32 pg (ref 26.0–34.0)
MCHC: 35.2 g/dL (ref 30.0–36.0)
MCV: 91.1 fL (ref 78.0–100.0)
Platelets: 196 10*3/uL (ref 150–400)
RBC: 3.81 MIL/uL — AB (ref 4.22–5.81)
RDW: 15.4 % (ref 11.5–15.5)
WBC: 10.5 10*3/uL (ref 4.0–10.5)

## 2016-07-26 LAB — MAGNESIUM: MAGNESIUM: 1.3 mg/dL — AB (ref 1.7–2.4)

## 2016-07-26 MED ORDER — DEXTROSE 5 % IV SOLN
INTRAVENOUS | Status: DC
  Administered 2016-07-26: 1000 mL via INTRAVENOUS
  Administered 2016-07-27: 75 mL via INTRAVENOUS

## 2016-07-26 NOTE — Progress Notes (Signed)
PROGRESS NOTE                                                                                                                                                                                                             Patient Demographics:    George Valdez, is a 61 y.o. male, DOB - 05/07/1956, [id number]  Admit date - 07/22/2016   Admitting Physician Phillips Grout, MD  Outpatient Primary MD for the patient is Jana Half  LOS - 4  Chief Complaint  Patient presents with  . Loss of Consciousness  . Bradycardia       Brief Narrative  George Valdez is a 61 y.o. male with medical history significant of previous CVA with left sided weakness, etoh abuse last drink 12 days ago, chronic pain, HTN, CKD 4, CAD , COPD comes in with frequent falls and says he is weaker than normal. In the ER he was found to be severely dehydrated, was severely hypokalemic and  ARF. With hydration and electrolyte supplementation his electrolyte abnormalities and ARF have resolved, he is now in full blown DTs and is currently recovering from it.   Subjective:    George Valdez today Is very confused due to being in DTs, unable to answer questions or follow commands.     Assessment  & Plan :     1.Severe dehydration, ARF with severe hyperkalemia & hypomagnesemia . This is from poor oral intake, Replaced potassium & Mag aggressively, All stable now, ARF resolved, monitor electrolytes closely.  2. ARF on CK D4. Baseline creatinine close to 1.7. Due to dehydration and being on ACE inhibitor, stopped ACE, hydrated and ARF has resolved. Recent ultrasound noted showing medical renal disease.  3. Ongoing alcohol abuse. He denied upon admission and the next 2 days, now in full blown DTs, currently on CIWA protocol along with precidex drip, was already counseled extensively to quit alcohol in the presence of his drinking  buddy next day after admission. Keep NPO and in restrains.  4. CAD/PAD. Chest pain free. Troponin trend flat and non-ACS, continue aspirin, statin, Plavix, Imdur, low-dose beta blocker once blood pressure stabilizes. No acute issues.  5. History of CVA with mild left-sided weakness. Supportive care. Continue dual antiplatelet therapy and statin for secondary prevention when he can take PO.   Family Communication  :  friend  Code Status :  Full  Diet : Diet NPO time specified Except for: Sips with Meds   Disposition Plan  :  Home once DTs have resolved  Consults  :  None  Procedures  :    CT scan of head and C-spine. Nonacute  DVT Prophylaxis  :   Heparin  Lab Results  Component Value Date   PLT 196 07/26/2016    Inpatient Medications  Scheduled Meds: . aspirin EC  81 mg Oral Daily  . chlordiazePOXIDE  10 mg Oral TID  . clopidogrel  75 mg Oral Daily  . cyanocobalamin  1,000 mcg Intramuscular Q30 days  . DULoxetine  60 mg Oral Daily  . feeding supplement (PRO-STAT SUGAR FREE 64)  30 mL Oral TID WC  . folic acid  1 mg Oral Daily  . heparin subcutaneous  5,000 Units Subcutaneous Q8H  . Influenza vac split quadrivalent PF  0.5 mL Intramuscular Tomorrow-1000  . isosorbide mononitrate  10 mg Oral Daily  . LORazepam  0-4 mg Intravenous Q12H  . metoprolol succinate  25 mg Oral BID WC  . multivitamin with minerals  1 tablet Oral Daily  . pneumococcal 23 valent vaccine  0.5 mL Intramuscular Tomorrow-1000  . rosuvastatin  40 mg Oral q1800  . thiamine  100 mg Oral Daily   Or  . thiamine  100 mg Intravenous Daily  . tiotropium  18 mcg Inhalation Daily   Continuous Infusions: . dexmedetomidine Stopped (07/26/16 0755)  . dextrose     PRN Meds:.albuterol, LORazepam, LORazepam  Antibiotics  :    Anti-infectives    None         Objective:   Vitals:   07/26/16 0400 07/26/16 0500 07/26/16 0730 07/26/16 0828  BP: 111/75 122/73    Pulse: (!) 58 (!) 56 (!) 59   Resp:   17 20   Temp: 97.4 F (36.3 C)  97.4 F (36.3 C)   TempSrc: Axillary  Axillary   SpO2: 92% 91% 95% 97%  Weight:  71.5 kg (157 lb 10.1 oz)    Height:        Wt Readings from Last 3 Encounters:  07/26/16 71.5 kg (157 lb 10.1 oz)  12/03/15 81.3 kg (179 lb 4.5 oz)  11/16/15 89.8 kg (198 lb)     Intake/Output Summary (Last 24 hours) at 07/26/16 0924 Last data filed at 07/26/16 0825  Gross per 24 hour  Intake          1096.16 ml  Output             3150 ml  Net         -2053.84 ml     Physical Exam  Confused, in DTs, moving all 4 extremities by himself George Valdez.AT,PERRAL, Supple Neck,No JVD, No cervical lymphadenopathy appriciated.  Symmetrical Chest wall movement, Good air movement bilaterally, CTAB RRR,No Gallops,Rubs or new Murmurs, No Parasternal Heave +ve B.Sounds, Abd Soft, No tenderness, No organomegaly appriciated, No rebound - guarding or rigidity. No Cyanosis, Clubbing or edema, No new Rash or bruise       Data Review:    CBC  Recent Labs Lab 07/22/16 1820 07/23/16 0553 07/26/16 0902  WBC 11.9* 11.9* 10.5  HGB 13.8 13.3 12.2*  HCT 40.2 39.1 34.7*  PLT 283 289 196  MCV 91.8 92.4 91.1  MCH 31.5 31.4 32.0  MCHC 34.3 34.0 35.2  RDW 15.7* 16.0* 15.4  LYMPHSABS 1.1  --   --   MONOABS 0.9  --   --  EOSABS 0.1  --   --   BASOSABS 0.0  --   --     Chemistries   Recent Labs Lab 07/22/16 1820 07/22/16 2308 07/23/16 0132 07/23/16 0553 07/23/16 1712 07/24/16 0608 07/25/16 0500  NA 144  --  144 145  --  141 144  K 2.2*  --  2.2* 2.2* 2.5* 2.5* 4.3  CL 110  --  114* 114*  --  116* 120*  CO2 26  --  20* 21*  --  19* 19*  GLUCOSE 105*  --  92 80  --  121* 127*  BUN 17  --  17 17  --  15 12  CREATININE 2.03*  --  1.82* 1.75*  --  1.28* 0.98  CALCIUM 8.9  --  8.1* 8.2*  --  7.5* 7.0*  MG  --  1.9  --   --  1.7 1.5* 2.0  AST 25  --   --   --   --   --  28  ALT 17  --   --   --   --   --  19  ALKPHOS 81  --   --   --   --   --  71  BILITOT 0.6  --   --    --   --   --  0.5   ------------------------------------------------------------------------------------------------------------------ No results for input(s): CHOL, HDL, LDLCALC, TRIG, CHOLHDL, LDLDIRECT in the last 72 hours.  Lab Results  Component Value Date   HGBA1C 5.9 (H) 11/30/2015   ------------------------------------------------------------------------------------------------------------------ No results for input(s): TSH, T4TOTAL, T3FREE, THYROIDAB in the last 72 hours.  Invalid input(s): FREET3 ------------------------------------------------------------------------------------------------------------------ No results for input(s): VITAMINB12, FOLATE, FERRITIN, TIBC, IRON, RETICCTPCT in the last 72 hours.  Coagulation profile No results for input(s): INR, PROTIME in the last 168 hours.  No results for input(s): DDIMER in the last 72 hours.  Cardiac Enzymes  Recent Labs Lab 07/23/16 0132 07/23/16 0553 07/23/16 1158  TROPONINI 0.04* 0.06* 0.06*   ------------------------------------------------------------------------------------------------------------------ No results found for: BNP  Micro Results Recent Results (from the past 240 hour(s))  MRSA PCR Screening     Status: None   Collection Time: 07/22/16 11:35 PM  Result Value Ref Range Status   MRSA by PCR NEGATIVE NEGATIVE Final    Comment:        The GeneXpert MRSA Assay (FDA approved for NASAL specimens only), is one component of a comprehensive MRSA colonization surveillance program. It is not intended to diagnose MRSA infection nor to guide or monitor treatment for MRSA infections.     Radiology Reports Dg Chest 2 View  Result Date: 07/22/2016 CLINICAL DATA:  Syncopal episode. EXAM: CHEST  2 VIEW COMPARISON:  11/30/2015 FINDINGS: AP and lateral views of the chest show left base atelectasis or infiltrate. Right lung clear. Cardiopericardial silhouette is at upper limits of normal for size. The  visualized bony structures of the thorax are intact. Old right clavicle fracture noted. Telemetry leads overlie the chest. IMPRESSION: Minimal left base atelectasis or infiltrate. Electronically Signed   By: Misty Stanley M.D.   On: 07/22/2016 18:43   Ct Head Wo Contrast  Result Date: 07/22/2016 CLINICAL DATA:  Patient fell at home.  Abrasions to nose. EXAM: CT HEAD WITHOUT CONTRAST CT CERVICAL SPINE WITHOUT CONTRAST TECHNIQUE: Multidetector CT imaging of the head and cervical spine was performed following the standard protocol without intravenous contrast. Multiplanar CT image reconstructions of the cervical spine were also generated. COMPARISON:  Head  CT 11/30/2015 cervical spine CT 09/05/2014. FINDINGS: CT HEAD FINDINGS Brain: Stable appearance old left inferior cerebellar infarct. There is no evidence for acute hemorrhage, hydrocephalus, mass lesion, or abnormal extra-axial fluid collection. No definite CT evidence for acute infarction. Lacunar infarcts noted in the basal ganglia bilaterally . Vascular: Atherosclerotic calcification is visualized in the carotid arteries. No dense MCA sign. Major dural sinuses are unremarkable. Skull: No evidence for fracture. No worrisome lytic or sclerotic lesion. Sinuses/Orbits: The visualized paranasal sinuses and mastoid air cells are clear. Visualized portions of the globes and intraorbital fat are unremarkable. Other: None. CT CERVICAL SPINE FINDINGS Alignment: Straightening of the normal cervical lordosis evident. No subluxation. Skull base and vertebrae: No evidence of fracture from the skullbase through the T1 vertebral body. Soft tissues and spinal canal: No prevertebral fluid or swelling. No visible canal hematoma. Multiple surgical clips noted left neck. Disc levels: Loss of disc height seen at C5-6. Facets are well aligned bilaterally. Upper chest: Negative. Other: None. IMPRESSION: 1. Stable head CT. Old left inferior cerebellar infarct without new or acute  intracranial abnormality. 2.  Degenerative changes C5-6.  No cervical spine fracture. 3. Loss of cervical lordosis. This can be related to patient positioning, muscle spasm or soft tissue injury. Electronically Signed   By: Misty Stanley M.D.   On: 07/22/2016 19:32   Ct Cervical Spine Wo Contrast  Result Date: 07/22/2016 CLINICAL DATA:  Patient fell at home.  Abrasions to nose. EXAM: CT HEAD WITHOUT CONTRAST CT CERVICAL SPINE WITHOUT CONTRAST TECHNIQUE: Multidetector CT imaging of the head and cervical spine was performed following the standard protocol without intravenous contrast. Multiplanar CT image reconstructions of the cervical spine were also generated. COMPARISON:  Head CT 11/30/2015 cervical spine CT 09/05/2014. FINDINGS: CT HEAD FINDINGS Brain: Stable appearance old left inferior cerebellar infarct. There is no evidence for acute hemorrhage, hydrocephalus, mass lesion, or abnormal extra-axial fluid collection. No definite CT evidence for acute infarction. Lacunar infarcts noted in the basal ganglia bilaterally . Vascular: Atherosclerotic calcification is visualized in the carotid arteries. No dense MCA sign. Major dural sinuses are unremarkable. Skull: No evidence for fracture. No worrisome lytic or sclerotic lesion. Sinuses/Orbits: The visualized paranasal sinuses and mastoid air cells are clear. Visualized portions of the globes and intraorbital fat are unremarkable. Other: None. CT CERVICAL SPINE FINDINGS Alignment: Straightening of the normal cervical lordosis evident. No subluxation. Skull base and vertebrae: No evidence of fracture from the skullbase through the T1 vertebral body. Soft tissues and spinal canal: No prevertebral fluid or swelling. No visible canal hematoma. Multiple surgical clips noted left neck. Disc levels: Loss of disc height seen at C5-6. Facets are well aligned bilaterally. Upper chest: Negative. Other: None. IMPRESSION: 1. Stable head CT. Old left inferior cerebellar  infarct without new or acute intracranial abnormality. 2.  Degenerative changes C5-6.  No cervical spine fracture. 3. Loss of cervical lordosis. This can be related to patient positioning, muscle spasm or soft tissue injury. Electronically Signed   By: Misty Stanley M.D.   On: 07/22/2016 19:32   Mr Brain Wo Contrast  Result Date: 07/22/2016 CLINICAL DATA:  Initial evaluation for weakness, multiple falls. History of prior stroke. EXAM: MRI HEAD WITHOUT CONTRAST TECHNIQUE: Multiplanar, multiecho pulse sequences of the brain and surrounding structures were obtained without intravenous contrast. COMPARISON:  Prior CT from earlier the same day. FINDINGS: Brain: Study moderately degraded by motion artifact. Mild diffuse prominence of the CSF containing spaces is compatible with generalized cerebral atrophy. Patchy and  confluent T2/FLAIR hyperintensity within the periventricular and deep white matter both cerebral hemispheres most compatible chronic vessel ischemic disease, mild to moderate nature. Chronic microvascular ischemic changes present within the pons as well. There are several superimposed small remote lacunar infarcts within the left basal ganglia. Small focus of encephalomalacia with gliosis within the cortical gray matter of the anterior left frontal lobe also compatible with a small remote infarct (series 9, image 16). Large remote left cerebellar infarct noted. There is a punctate 4 mm focus of diffusion abnormality within the cortical gray matter of the left parietal lobe (series 3, image 94), suspicious for possible small acute/early subacute cortical infarct. No associated hemorrhage. No other evidence for acute or subacute ischemia. Gray-white matter differentiation otherwise maintained. No evidence for acute or chronic intracranial hemorrhage. No mass lesion, midline shift or mass effect. No hydrocephalus. No extra-axial fluid collection. Major dural sinuses are grossly patent. Pituitary gland not  well evaluated on this motion degraded study. Vascular: Left vertebral artery is CED small/hypoplastic, and possibly occluded. Major intracranial vascular flow voids otherwise maintained. Skull and upper cervical spine: Craniocervical junction within normal limits. Visualized upper cervical spine unremarkable. Bone marrow signal intensity within normal limits. No scalp soft tissue abnormality. Sinuses/Orbits: Globes and orbital soft tissues within normal limits. Mild scattered mucosal thickening within the ethmoidal air cells and maxillary sinuses. No air-fluid level to suggest active sinus infection. No significant mastoid effusion. Inner ear structures grossly normal. IMPRESSION: 1. Punctate 4 mm focus of diffusion abnormality within the cortical gray matter of the left parietal lobe, suspicious for tiny acute/early subacute ischemic infarct. No associated hemorrhage. 2. No other acute intracranial process identified. 3. Large remote left cerebellar infarct, with additional small remote left frontal cortical infarct and remote lacunar infarcts within the left basal ganglia. 4. Mild to moderate chronic microvascular ischemic disease. Electronically Signed   By: Jeannine Boga M.D.   On: 07/22/2016 21:54    Time Spent in minutes  30   SINGH,PRASHANT K M.D on 07/26/2016 at 9:24 AM  Between 7am to 7pm - Pager - [phone]  After 7pm go to [email or web address] - password [facility]  [facility] -  Office  [phone]

## 2016-07-26 NOTE — Evaluation (Signed)
Physical Therapy Evaluation Patient Details Name: George Valdez MRN: [id number] DOB: [birth year] Today's Date: 07/26/2016   History of Present Illness  61 y.o. male with medical history significant of previous CVA with left sided weakness, etoh abuse last drink 12 days ago, chronic pain, HTN, CAD , COPD comes in with frequent falls and says he is weaker than normal.  Pt reports his left knee "just goes out" and "buckles" and he falls.  He denies any LOC.  He denies any recent illnesses.  No fevers.  No n/v/d.  Pt appears disheveled and has multiple scratches and open wounds to arms he says is due to his falls.  He denies any pain anywhere.  Denies any sob, cough or flu like symptoms.  He reports his left side may have been more weak than the other but overall he says he just felt weak.  Pt found to have k level of 2.2 and low bp and HR.  EMS gave him a dose of atropine in the field.  Pt has been given several liters of ivf and he says he is feeling much better.  He is referred for admission for low bp, low k level, dehydration with AKI.  Clinical Impression  Pt received in bed, asleep, but was agreeable to PT evaluation.  He is very difficult to understand due to speech deficits from previous CVA.  He states that he normally ambulates with a cane, and is independent with dressing and bathing.  Uncertain of how accurate this information is due to pt only being oriented x2.  He states he lives with his wife, but also states that his wife has been at [place] for the past 3 months due to B ankle fx's.  During PT evaluation, he required Max A for supine<>sit, and stand pivot transfer bed<>chair going to the right.  He is not safe to d/c home at this point due to poor mobility, high risk of recurrent falls, as well as limited assistance.  He is therefore recommended for SNF     Follow Up Recommendations SNF    Equipment Recommendations  None recommended by PT    Recommendations for Other Services        Precautions / Restrictions Precautions Precautions: Fall Precaution Comments: Pt has had multiple falls - he states it is related to his L sided weakness.        Mobility  Bed Mobility Overal bed mobility: Needs Assistance Bed Mobility: Supine to Sit     Supine to sit: Max assist;HOB elevated     General bed mobility comments: Poor initiation, and repors of dizziness upon sitting on the EOB. Therefore, increased time allowed for dizziness to subside.  Encouraged pt to pump LE's while sitting on the EOB.  Transfers Overall transfer level: Needs assistance Equipment used: None Transfers: Stand Pivot Transfers   Stand pivot transfers: Max assist (Going to the right.)          Ambulation/Gait Ambulation/Gait assistance:  (NA due to poor standing tolerance and transfer.  )              Stairs            Wheelchair Mobility    Modified Rankin (Stroke Patients Only)       Balance Overall balance assessment: History of Falls;Needs assistance Sitting-balance support: Feet supported;Bilateral upper extremity supported Sitting balance-Leahy Scale: Poor Sitting balance - Comments: Pt requires Min A due to c/o dizzness with strong lean to the right initially, however  as pt is able to get hips squared underneath him, his balance improves, but he contiues to require close supervision while sitting on the EOB.      Standing balance-Leahy Scale: Poor                               Pertinent Vitals/Pain Pain Assessment: No/denies pain    Home Living   Living Arrangements: Spouse/significant other (wife, however pt reports that wife was recently at [place] for 3 months due to B ankle fx's. and has just returned home. )     Home Access: Other (comment) ("not really, as far as I remember." )     Home Layout: One level Home Equipment: Cane - single point;Walker - 2 wheels;Wheelchair - Psychiatric nurse / Transfers  Assistance Needed: modified independent with a cane  ADL's / Homemaking Assistance Needed: Audry Pili - a friend, takes them grocery shopping.  Pt states he is independent with dressing and bathing.         Hand Dominance   Dominant Hand: Right    Extremity/Trunk Assessment   Upper Extremity Assessment Upper Extremity Assessment: LUE deficits/detail;Generalized weakness LUE Deficits / Details: spastic hemiplegia noted with increased extensor tone with mobility.     Lower Extremity Assessment Lower Extremity Assessment: Generalized weakness;LLE deficits/detail LLE Deficits / Details: Pt states he wears a brace on his L foot, and his L LE is much weaker compared to the right.     Cervical / Trunk Assessment Cervical / Trunk Assessment: Kyphotic  Communication   Communication:  (slurred speech)  Cognition Arousal/Alertness: Awake/alert Behavior During Therapy: WFL for tasks assessed/performed Overall Cognitive Status: Impaired/Different from baseline Area of Impairment: Orientation Orientation Level: Disoriented to;Place;Time                  General Comments      Exercises     Assessment/Plan    PT Assessment Patient needs continued PT services  PT Problem List Decreased strength;Decreased activity tolerance;Decreased balance;Decreased mobility;Decreased cognition;Decreased knowledge of use of DME;Decreased safety awareness;Decreased knowledge of precautions;Decreased coordination          PT Treatment Interventions DME instruction;Gait training;Functional mobility training;Therapeutic activities;Therapeutic exercise;Balance training;Neuromuscular re-education;Cognitive remediation;Patient/family education    PT Goals (Current goals can be found in the Care Plan section)  Acute Rehab PT Goals Patient Stated Goal: Pt expressed he wants to go home.  PT Goal Formulation: With patient Time For Goal Achievement: 08/02/16 Potential to Achieve Goals: Fair     Frequency Min 3X/week   Barriers to discharge Decreased caregiver support Pt lives with his wife, however he states that she was at [place] for 3 months because she had 2 broken ankles, and she just got back home.    Co-evaluation               End of Session Equipment Utilized During Treatment: Gait belt Activity Tolerance: Patient limited by fatigue Patient left: in chair;with call bell/phone within reach;with nursing/sitter in room Nurse Communication: Mobility status;Need for lift equipment (Crystal, RN notified of pt's mobiltiy status and recommendations to use STEDY for transfers.  Mobility sheet left in the room.  )    Functional Assessment Tool Used: [employer]	[place] AM-PAC "6-clicks" Functional Limitation: Mobility: Walking and moving around Mobility: Walking and Moving Around Current Status [phone]): At least 40 percent but less than 60 percent impaired, limited  or restricted Mobility: Walking and Moving Around Goal Status [phone]): At least 20 percent but less than 40 percent impaired, limited or restricted    Time: 1113-1149 PT Time Calculation (min) (ACUTE ONLY): 36 min   Charges:   PT Evaluation $PT Eval Moderate Complexity: 1 Procedure PT Treatments $Therapeutic Activity: 8-22 mins   PT G Codes:   PT G-Codes **NOT FOR INPATIENT CLASS** Functional Assessment Tool Used: [employer] "6-clicks" Functional Limitation: Mobility: Walking and moving around Mobility: Walking and Moving Around Current Status [phone]): At least 40 percent but less than 60 percent impaired, limited or restricted Mobility: Walking and Moving Around Goal Status [phone]): At least 20 percent but less than 40 percent impaired, limited or restricted    Beth Emeric Novinger, PT, DPT X: [phone]

## 2016-07-26 NOTE — Progress Notes (Signed)
[id number] Precedex IV gtt stopped x 2hrs per Dr.Singh order to allow patient to arouse.

## 2016-07-27 LAB — COMPREHENSIVE METABOLIC PANEL
ALT: 22 U/L (ref 17–63)
AST: 24 U/L (ref 15–41)
Albumin: 2.7 g/dL — ABNORMAL LOW (ref 3.5–5.0)
Alkaline Phosphatase: 73 U/L (ref 38–126)
Anion gap: 8 (ref 5–15)
BILIRUBIN TOTAL: 0.9 mg/dL (ref 0.3–1.2)
BUN: 6 mg/dL (ref 6–20)
CHLORIDE: 107 mmol/L (ref 101–111)
CO2: 20 mmol/L — ABNORMAL LOW (ref 22–32)
CREATININE: 0.93 mg/dL (ref 0.61–1.24)
Calcium: 7.8 mg/dL — ABNORMAL LOW (ref 8.9–10.3)
Glucose, Bld: 110 mg/dL — ABNORMAL HIGH (ref 65–99)
Potassium: 2.4 mmol/L — CL (ref 3.5–5.1)
Sodium: 135 mmol/L (ref 135–145)
Total Protein: 5.9 g/dL — ABNORMAL LOW (ref 6.5–8.1)

## 2016-07-27 LAB — CBC
HCT: 35.7 % — ABNORMAL LOW (ref 39.0–52.0)
Hemoglobin: 12.6 g/dL — ABNORMAL LOW (ref 13.0–17.0)
MCH: 31.8 pg (ref 26.0–34.0)
MCHC: 35.3 g/dL (ref 30.0–36.0)
MCV: 90.2 fL (ref 78.0–100.0)
PLATELETS: 277 10*3/uL (ref 150–400)
RBC: 3.96 MIL/uL — ABNORMAL LOW (ref 4.22–5.81)
RDW: 15.1 % (ref 11.5–15.5)
WBC: 9.9 10*3/uL (ref 4.0–10.5)

## 2016-07-27 LAB — BASIC METABOLIC PANEL
Anion gap: 9 (ref 5–15)
BUN: 7 mg/dL (ref 6–20)
CALCIUM: 7.7 mg/dL — AB (ref 8.9–10.3)
CO2: 19 mmol/L — ABNORMAL LOW (ref 22–32)
Chloride: 107 mmol/L (ref 101–111)
Creatinine, Ser: 0.91 mg/dL (ref 0.61–1.24)
GFR calc Af Amer: >60 mL/min (ref >60)
GLUCOSE: 89 mg/dL (ref 65–99)
Potassium: 2.5 mmol/L — CL (ref 3.5–5.1)
Sodium: 135 mmol/L (ref 135–145)

## 2016-07-27 LAB — MAGNESIUM: Magnesium: 1.3 mg/dL — ABNORMAL LOW (ref 1.7–2.4)

## 2016-07-27 MED ORDER — MAGNESIUM SULFATE 2 GM/50ML IV SOLN: 2.0000 g | Freq: Once | INTRAVENOUS | Status: DC

## 2016-07-27 MED ORDER — LORAZEPAM 2 MG/ML IJ SOLN: 1.0000 mg | Freq: Four times a day (QID) | INTRAMUSCULAR | Status: DC | PRN

## 2016-07-27 MED ORDER — POTASSIUM CHLORIDE 20 MEQ/15ML (10%) PO SOLN
40.0000 meq | Freq: Two times a day (BID) | ORAL | Status: AC
  Administered 2016-07-27 – 2016-07-28 (×2): 40 meq via ORAL
  Filled 2016-07-27 (×2): qty 30

## 2016-07-27 MED ORDER — SODIUM CHLORIDE 0.9 % IV SOLN
INTRAVENOUS | Status: DC
  Administered 2016-07-27: 15:00:00 via INTRAVENOUS
  Filled 2016-07-27 (×3): qty 1000

## 2016-07-27 MED ORDER — MAGNESIUM SULFATE 2 GM/50ML IV SOLN
2.0000 g | Freq: Once | INTRAVENOUS | Status: AC
  Administered 2016-07-27: 2 g via INTRAVENOUS
  Filled 2016-07-27: qty 50

## 2016-07-27 MED ORDER — METOPROLOL SUCCINATE ER 50 MG PO TB24
50.0000 mg | ORAL_TABLET | Freq: Two times a day (BID) | ORAL | Status: DC
  Administered 2016-07-27 – 2016-08-04 (×12): 50 mg via ORAL
  Filled 2016-07-27 (×12): qty 1

## 2016-07-27 MED ORDER — LOPERAMIDE HCL 2 MG PO CAPS
2.0000 mg | ORAL_CAPSULE | ORAL | Status: DC | PRN
  Filled 2016-07-27: qty 1

## 2016-07-27 MED ORDER — CHLORDIAZEPOXIDE HCL 5 MG PO CAPS
5.0000 mg | ORAL_CAPSULE | Freq: Three times a day (TID) | ORAL | Status: DC
  Administered 2016-07-27 – 2016-08-04 (×21): 5 mg via ORAL
  Filled 2016-07-27 (×19): qty 1

## 2016-07-27 MED ORDER — HALOPERIDOL LACTATE 5 MG/ML IJ SOLN
2.0000 mg | INTRAMUSCULAR | Status: DC | PRN
Start: 2016-07-27 — End: 2016-07-28
  Administered 2016-07-27 – 2016-07-28 (×5): 2 mg via INTRAVENOUS
  Filled 2016-07-27 (×5): qty 1

## 2016-07-27 NOTE — Progress Notes (Signed)
PROGRESS NOTE                                                                                                                                                                                                             Patient Demographics:    George Valdez, is a 61 y.o. male, DOB - Sep 04, 1955, [id number]  Admit date - 07/22/2016   Admitting Physician Phillips Grout, MD  Outpatient Primary MD for the patient is Jana Half  LOS - 5  Chief Complaint  Patient presents with  . Loss of Consciousness  . Bradycardia       Brief Narrative  George Valdez is a 61 y.o. male with medical history significant of previous CVA with left sided weakness, etoh abuse last drink 12 days ago, chronic pain, HTN, CKD 4, CAD , COPD comes in with frequent falls and says he is weaker than normal. In the ER he was found to be severely dehydrated, was severely hypokalemic and  ARF. With hydration and electrolyte supplementation his electrolyte abnormalities and ARF have resolved, he is now in full blown DTs and has developed diarrhea for which C. difficile infection is being ruled out.   Subjective:    George Valdez today Is very confused due to being in DTs, unable to answer questions or follow commands.     Assessment  & Plan :     1.Severe dehydration, ARF with severe hyperkalemia & hypomagnesemia . This was from poor oral intake, Replaced potassium & Mag aggressively,  ARF resolved, He developed diarrhea night of 07/26/2016 causing further potassium and magnesium loss, electrolytes will be replaced again .  2. ARF on CK D4. Baseline creatinine close to 1.7. Due to dehydration and being on ACE inhibitor, stopped ACE, hydrated and ARF has resolved. Recent ultrasound noted showing medical renal disease.  3. Ongoing alcohol abuse. He denied upon admission and the next 2 days, now in DTs, needed  CIWA protocol  along with precidex drip, was already counseled extensively to quit alcohol in the presence of his drinking buddy. He seems to be slightly better will try to manage him with Librium along with Haldol and low-dose Ativan now.  4. CAD/PAD. Chest pain free. Troponin trend flat and non-ACS, continue aspirin, statin, Plavix, Imdur, low-dose beta blocker once blood pressure stabilizes. No acute issues.  5.  History of CVA with mild left-sided weakness. Supportive care. Continue dual antiplatelet therapy and statin for secondary prevention when he can take PO.  6. Diarrhea night of 07/26/2016. No antibiotic exposure in the hospital, no leukocytosis, clinically less likely to be C. difficile will check. If negative place on Imodium.    Family Communication  :  friend  Code Status :  Full  Diet : Diet clear liquid Room service appropriate? Yes; Fluid consistency: Thin   Disposition Plan  :  Home once DTs have resolved  Consults  :  None  Procedures  :    CT scan of head and C-spine. Nonacute  DVT Prophylaxis  :   Heparin  Lab Results  Component Value Date   PLT 277 07/27/2016    Inpatient Medications  Scheduled Meds: . aspirin EC  81 mg Oral Daily  . chlordiazePOXIDE  5 mg Oral TID  . clopidogrel  75 mg Oral Daily  . cyanocobalamin  1,000 mcg Intramuscular Q30 days  . DULoxetine  60 mg Oral Daily  . feeding supplement (PRO-STAT SUGAR FREE 64)  30 mL Oral TID WC  . folic acid  1 mg Oral Daily  . heparin subcutaneous  5,000 Units Subcutaneous Q8H  . Influenza vac split quadrivalent PF  0.5 mL Intramuscular Tomorrow-1000  . isosorbide mononitrate  10 mg Oral Daily  . magnesium sulfate 1 - 4 g bolus IVPB  2 g Intravenous Once  . metoprolol succinate  50 mg Oral BID WC  . multivitamin with minerals  1 tablet Oral Daily  . pneumococcal 23 valent vaccine  0.5 mL Intramuscular Tomorrow-1000  . potassium chloride  40 mEq Oral BID  . rosuvastatin  40 mg Oral q1800  . thiamine  100 mg  Intravenous Daily  . tiotropium  18 mcg Inhalation Daily   Continuous Infusions: . 0.9 % sodium chloride with kcl     PRN Meds:.albuterol, haloperidol lactate, loperamide, LORazepam  Antibiotics  :    Anti-infectives    None         Objective:   Vitals:   07/27/16 0400 07/27/16 0500 07/27/16 0600 07/27/16 0742  BP: (!) 144/92 (!) 126/92 114/81   Pulse: (!) 110 (!) 115 (!) 110 (!) 117  Resp: (!) 22 (!) 26 (!) 24 (!) 36  Temp: 98.9 F (37.2 C)   100 F (37.8 C)  TempSrc: Oral   Axillary  SpO2: 96% 99% 92% 93%  Weight:  67.9 kg (149 lb 11.1 oz)    Height:        Wt Readings from Last 3 Encounters:  07/27/16 67.9 kg (149 lb 11.1 oz)  12/03/15 81.3 kg (179 lb 4.5 oz)  11/16/15 89.8 kg (198 lb)     Intake/Output Summary (Last 24 hours) at 07/27/16 0908 Last data filed at 07/27/16 0600  Gross per 24 hour  Intake          1777.56 ml  Output             1500 ml  Net           277.56 ml     Physical Exam  Less Confused, still in DTs, moving all 4 extremities by himself [place].AT,PERRAL, Supple Neck,No JVD, No cervical lymphadenopathy appriciated.  Symmetrical Chest wall movement, Good air movement bilaterally, CTAB RRR,No Gallops,Rubs or new Murmurs, No Parasternal Heave +ve B.Sounds, Abd Soft, No tenderness, No organomegaly appriciated, No rebound - guarding or rigidity. No Cyanosis, Clubbing or edema, No new  Rash or bruise       Data Review:    CBC  Recent Labs Lab 07/22/16 1820 07/23/16 0553 07/26/16 0902 07/27/16 0431  WBC 11.9* 11.9* 10.5 9.9  HGB 13.8 13.3 12.2* 12.6*  HCT 40.2 39.1 34.7* 35.7*  PLT 283 289 196 277  MCV 91.8 92.4 91.1 90.2  MCH 31.5 31.4 32.0 31.8  MCHC 34.3 34.0 35.2 35.3  RDW 15.7* 16.0* 15.4 15.1  LYMPHSABS 1.1  --   --   --   MONOABS 0.9  --   --   --   EOSABS 0.1  --   --   --   BASOSABS 0.0  --   --   --     Chemistries   Recent Labs Lab 07/22/16 1820  07/23/16 1712 07/24/16 0608 07/25/16 0500 07/26/16 0902  07/26/16 0929 07/27/16 0431  NA 144  < >  --  141 144 135 135 135  K 2.2*  < > 2.5* 2.5* 4.3 3.1* 3.2* 2.5*  CL 110  < >  --  116* 120* 109 108 107  CO2 26  < >  --  19* 19* 20* 21* 19*  GLUCOSE 105*  < >  --  121* 127* 126* 113* 89  BUN 17  < >  --  15 12 8 8 7   CREATININE 2.03*  < >  --  1.28* 0.98 0.81 0.99 0.91  CALCIUM 8.9  < >  --  7.5* 7.0* 7.3* 7.6* 7.7*  MG  --   < > 1.7 1.5* 2.0 1.3*  --  1.3*  AST 25  --   --   --  28  --  26  --   ALT 17  --   --   --  19  --  22  --   ALKPHOS 81  --   --   --  71  --  83  --   BILITOT 0.6  --   --   --  0.5  --  0.9  --   < > = values in this interval not displayed. ------------------------------------------------------------------------------------------------------------------ No results for input(s): CHOL, HDL, LDLCALC, TRIG, CHOLHDL, LDLDIRECT in the last 72 hours.  Lab Results  Component Value Date   HGBA1C 5.9 (H) 11/30/2015   ------------------------------------------------------------------------------------------------------------------ No results for input(s): TSH, T4TOTAL, T3FREE, THYROIDAB in the last 72 hours.  Invalid input(s): FREET3 ------------------------------------------------------------------------------------------------------------------ No results for input(s): VITAMINB12, FOLATE, FERRITIN, TIBC, IRON, RETICCTPCT in the last 72 hours.  Coagulation profile No results for input(s): INR, PROTIME in the last 168 hours.  No results for input(s): DDIMER in the last 72 hours.  Cardiac Enzymes  Recent Labs Lab 07/23/16 0132 07/23/16 0553 07/23/16 1158  TROPONINI 0.04* 0.06* 0.06*   ------------------------------------------------------------------------------------------------------------------ No results found for: BNP  Micro Results Recent Results (from the past 240 hour(s))  MRSA PCR Screening     Status: None   Collection Time: 07/22/16 11:35 PM  Result Value Ref Range Status   MRSA by PCR  NEGATIVE NEGATIVE Final    Comment:        The GeneXpert MRSA Assay (FDA approved for NASAL specimens only), is one component of a comprehensive MRSA colonization surveillance program. It is not intended to diagnose MRSA infection nor to guide or monitor treatment for MRSA infections.     Radiology Reports Dg Chest 2 View  Result Date: 07/22/2016 CLINICAL DATA:  Syncopal episode. EXAM: CHEST  2 VIEW COMPARISON:  11/30/2015 FINDINGS: AP and  lateral views of the chest show left base atelectasis or infiltrate. Right lung clear. Cardiopericardial silhouette is at upper limits of normal for size. The visualized bony structures of the thorax are intact. Old right clavicle fracture noted. Telemetry leads overlie the chest. IMPRESSION: Minimal left base atelectasis or infiltrate. Electronically Signed   By: Misty Stanley M.D.   On: 07/22/2016 18:43   Ct Head Wo Contrast  Result Date: 07/22/2016 CLINICAL DATA:  Patient fell at home.  Abrasions to nose. EXAM: CT HEAD WITHOUT CONTRAST CT CERVICAL SPINE WITHOUT CONTRAST TECHNIQUE: Multidetector CT imaging of the head and cervical spine was performed following the standard protocol without intravenous contrast. Multiplanar CT image reconstructions of the cervical spine were also generated. COMPARISON:  Head CT 11/30/2015 cervical spine CT 09/05/2014. FINDINGS: CT HEAD FINDINGS Brain: Stable appearance old left inferior cerebellar infarct. There is no evidence for acute hemorrhage, hydrocephalus, mass lesion, or abnormal extra-axial fluid collection. No definite CT evidence for acute infarction. Lacunar infarcts noted in the basal ganglia bilaterally . Vascular: Atherosclerotic calcification is visualized in the carotid arteries. No dense MCA sign. Major dural sinuses are unremarkable. Skull: No evidence for fracture. No worrisome lytic or sclerotic lesion. Sinuses/Orbits: The visualized paranasal sinuses and mastoid air cells are clear. Visualized portions  of the globes and intraorbital fat are unremarkable. Other: None. CT CERVICAL SPINE FINDINGS Alignment: Straightening of the normal cervical lordosis evident. No subluxation. Skull base and vertebrae: No evidence of fracture from the skullbase through the T1 vertebral body. Soft tissues and spinal canal: No prevertebral fluid or swelling. No visible canal hematoma. Multiple surgical clips noted left neck. Disc levels: Loss of disc height seen at C5-6. Facets are well aligned bilaterally. Upper chest: Negative. Other: None. IMPRESSION: 1. Stable head CT. Old left inferior cerebellar infarct without new or acute intracranial abnormality. 2.  Degenerative changes C5-6.  No cervical spine fracture. 3. Loss of cervical lordosis. This can be related to patient positioning, muscle spasm or soft tissue injury. Electronically Signed   By: Misty Stanley M.D.   On: 07/22/2016 19:32   Ct Cervical Spine Wo Contrast  Result Date: 07/22/2016 CLINICAL DATA:  Patient fell at home.  Abrasions to nose. EXAM: CT HEAD WITHOUT CONTRAST CT CERVICAL SPINE WITHOUT CONTRAST TECHNIQUE: Multidetector CT imaging of the head and cervical spine was performed following the standard protocol without intravenous contrast. Multiplanar CT image reconstructions of the cervical spine were also generated. COMPARISON:  Head CT 11/30/2015 cervical spine CT 09/05/2014. FINDINGS: CT HEAD FINDINGS Brain: Stable appearance old left inferior cerebellar infarct. There is no evidence for acute hemorrhage, hydrocephalus, mass lesion, or abnormal extra-axial fluid collection. No definite CT evidence for acute infarction. Lacunar infarcts noted in the basal ganglia bilaterally . Vascular: Atherosclerotic calcification is visualized in the carotid arteries. No dense MCA sign. Major dural sinuses are unremarkable. Skull: No evidence for fracture. No worrisome lytic or sclerotic lesion. Sinuses/Orbits: The visualized paranasal sinuses and mastoid air cells are  clear. Visualized portions of the globes and intraorbital fat are unremarkable. Other: None. CT CERVICAL SPINE FINDINGS Alignment: Straightening of the normal cervical lordosis evident. No subluxation. Skull base and vertebrae: No evidence of fracture from the skullbase through the T1 vertebral body. Soft tissues and spinal canal: No prevertebral fluid or swelling. No visible canal hematoma. Multiple surgical clips noted left neck. Disc levels: Loss of disc height seen at C5-6. Facets are well aligned bilaterally. Upper chest: Negative. Other: None. IMPRESSION: 1. Stable head CT. Old left  inferior cerebellar infarct without new or acute intracranial abnormality. 2.  Degenerative changes C5-6.  No cervical spine fracture. 3. Loss of cervical lordosis. This can be related to patient positioning, muscle spasm or soft tissue injury. Electronically Signed   By: Misty Stanley M.D.   On: 07/22/2016 19:32   Mr Brain Wo Contrast  Result Date: 07/22/2016 CLINICAL DATA:  Initial evaluation for weakness, multiple falls. History of prior stroke. EXAM: MRI HEAD WITHOUT CONTRAST TECHNIQUE: Multiplanar, multiecho pulse sequences of the brain and surrounding structures were obtained without intravenous contrast. COMPARISON:  Prior CT from earlier the same day. FINDINGS: Brain: Study moderately degraded by motion artifact. Mild diffuse prominence of the CSF containing spaces is compatible with generalized cerebral atrophy. Patchy and confluent T2/FLAIR hyperintensity within the periventricular and deep white matter both cerebral hemispheres most compatible chronic vessel ischemic disease, mild to moderate nature. Chronic microvascular ischemic changes present within the pons as well. There are several superimposed small remote lacunar infarcts within the left basal ganglia. Small focus of encephalomalacia with gliosis within the cortical gray matter of the anterior left frontal lobe also compatible with a small remote infarct  (series 9, image 16). Large remote left cerebellar infarct noted. There is a punctate 4 mm focus of diffusion abnormality within the cortical gray matter of the left parietal lobe (series 3, image 94), suspicious for possible small acute/early subacute cortical infarct. No associated hemorrhage. No other evidence for acute or subacute ischemia. Gray-white matter differentiation otherwise maintained. No evidence for acute or chronic intracranial hemorrhage. No mass lesion, midline shift or mass effect. No hydrocephalus. No extra-axial fluid collection. Major dural sinuses are grossly patent. Pituitary gland not well evaluated on this motion degraded study. Vascular: Left vertebral artery is CED small/hypoplastic, and possibly occluded. Major intracranial vascular flow voids otherwise maintained. Skull and upper cervical spine: Craniocervical junction within normal limits. Visualized upper cervical spine unremarkable. Bone marrow signal intensity within normal limits. No scalp soft tissue abnormality. Sinuses/Orbits: Globes and orbital soft tissues within normal limits. Mild scattered mucosal thickening within the ethmoidal air cells and maxillary sinuses. No air-fluid level to suggest active sinus infection. No significant mastoid effusion. Inner ear structures grossly normal. IMPRESSION: 1. Punctate 4 mm focus of diffusion abnormality within the cortical gray matter of the left parietal lobe, suspicious for tiny acute/early subacute ischemic infarct. No associated hemorrhage. 2. No other acute intracranial process identified. 3. Large remote left cerebellar infarct, with additional small remote left frontal cortical infarct and remote lacunar infarcts within the left basal ganglia. 4. Mild to moderate chronic microvascular ischemic disease. Electronically Signed   By: Jeannine Boga M.D.   On: 07/22/2016 21:54    Time Spent in minutes  30   Nickayla Mcinnis K M.D on 07/27/2016 at 9:08 AM  Between 7am to  7pm - Pager - [phone]  After 7pm go to [email or web address] - password [facility]  [facility] -  Office  [phone]

## 2016-07-27 NOTE — Progress Notes (Signed)
Paged Md to advise pt has had 6 loose stools since 7pm, and also there is no order for am K+. Md placed pt on Enteric and ordered Cdiff screen. No further orders.

## 2016-07-27 NOTE — Progress Notes (Signed)
SLP Cancellation Note     Patient Details Name: George Valdez MRN: [id number] DOB: 1956-03-21   Cancelled treatment:        Attempted BSE, however pt too lethargic and uncooperative at this time. Plan to attempt at a later time.   Thank you,   Renato Gails. Megan Salon Holcomb, CCC-SLP  Speech-Language Pathologist [phone]       Luther Redo 07/27/2016, 1:20 PM

## 2016-07-28 DIAGNOSIS — G9340 Encephalopathy, unspecified: Secondary | ICD-10-CM

## 2016-07-28 LAB — CBC
HEMATOCRIT: 33.3 % — AB (ref 39.0–52.0)
HEMOGLOBIN: 11.1 g/dL — AB (ref 13.0–17.0)
MCH: 30.3 pg (ref 26.0–34.0)
MCHC: 33.3 g/dL (ref 30.0–36.0)
MCV: 91 fL (ref 78.0–100.0)
Platelets: 255 10*3/uL (ref 150–400)
RBC: 3.66 MIL/uL — AB (ref 4.22–5.81)
RDW: 14.7 % (ref 11.5–15.5)
WBC: 8.9 10*3/uL (ref 4.0–10.5)

## 2016-07-28 LAB — BASIC METABOLIC PANEL
Anion gap: 8 (ref 5–15)
BUN: 8 mg/dL (ref 6–20)
CHLORIDE: 112 mmol/L — AB (ref 101–111)
CO2: 18 mmol/L — AB (ref 22–32)
Calcium: 8.1 mg/dL — ABNORMAL LOW (ref 8.9–10.3)
Creatinine, Ser: 0.93 mg/dL (ref 0.61–1.24)
GFR calc Af Amer: >60 mL/min (ref >60)
GFR calc non Af Amer: >60 mL/min (ref >60)
GLUCOSE: 111 mg/dL — AB (ref 65–99)
POTASSIUM: 2.7 mmol/L — AB (ref 3.5–5.1)
SODIUM: 138 mmol/L (ref 135–145)

## 2016-07-28 LAB — MAGNESIUM: Magnesium: 2 mg/dL (ref 1.7–2.4)

## 2016-07-28 LAB — GLUCOSE, CAPILLARY: Glucose-Capillary: 125 mg/dL — ABNORMAL HIGH (ref 65–99)

## 2016-07-28 MED ORDER — LORAZEPAM 2 MG/ML IJ SOLN
2.0000 mg | INTRAMUSCULAR | Status: DC | PRN
  Administered 2016-07-28 – 2016-07-29 (×2): 3 mg via INTRAVENOUS
  Filled 2016-07-28 (×2): qty 2

## 2016-07-28 MED ORDER — DEXTROSE-NACL 5-0.45 % IV SOLN
INTRAVENOUS | Status: DC
  Administered 2016-07-28: 1000 mL via INTRAVENOUS

## 2016-07-28 MED ORDER — POTASSIUM CHLORIDE 20 MEQ PO PACK
40.0000 meq | PACK | Freq: Once | ORAL | Status: AC
  Administered 2016-07-28: 40 meq via ORAL
  Filled 2016-07-28: qty 2

## 2016-07-28 MED ORDER — POTASSIUM CHLORIDE 10 MEQ/100ML IV SOLN
10.0000 meq | INTRAVENOUS | Status: AC
  Administered 2016-07-28 (×4): 10 meq via INTRAVENOUS
  Filled 2016-07-28 (×3): qty 100

## 2016-07-28 MED ORDER — LORAZEPAM 2 MG/ML IJ SOLN
1.0000 mg | INTRAMUSCULAR | Status: DC | PRN
  Administered 2016-07-28 – 2016-07-29 (×9): 2 mg via INTRAVENOUS
  Filled 2016-07-28 (×9): qty 1

## 2016-07-28 NOTE — Evaluation (Signed)
Clinical/Bedside Swallow Evaluation Patient Details  Name: George Valdez George Valdez MRN: [id number] Date of Birth: [birth year]  Today's Date: 07/28/2016 Time: SLP Start Time (ACUTE ONLY): [id number] SLP Stop Time (ACUTE ONLY): 1705 SLP Time Calculation (min) (ACUTE ONLY): 55 min  Past Medical History:  Past Medical History:  Diagnosis Date  . Anxiety   . BPH (benign prostatic hyperplasia)   . Chronic pain   . Coronary artery disease   . Depression   . Hyperlipemia   . Hypertension   . Neuropathy ([place])   . Pernicious anemia   . Stroke George Valdez Valdez)    Past Surgical History:  Past Surgical History:  Procedure Laterality Date  . CARDIAC CATHETERIZATION    . CAROTID ENDARTERECTOMY     HPI:  George Germani Campleseis a 61 y.o.malewith medical history significant of previous CVA with left sided weakness, etoh abuse last drink 12 days ago, chronic pain, HTN, CKD 4, CAD , COPD comes in with frequent falls and says he is weaker than normal. In the ER he was found to be severely dehydrated, was severely hypokalemic and ARF. With hydration and electrolyte supplementation his electrolyte abnormalities and ARF have resolved, he is now in full blown DTs and has developed diarrhea for which C. difficile infection is being ruled out. Severe dehydration, ARF with severe hyperkalemia &hypomagnesemia . This was from poor oral intake, Replaced potassium &Mag aggressively, ARF resolved, He developed diarrhea night of 07/26/2016 causing further potassium and magnesium loss, electrolytes will be replaced again. MRI shows: Punctate 4 mm focus of diffusion abnormality within the cortical gray matter of the left parietal lobe, suspicious for tiny acute/early subacute ischemic infarct. No associated hemorrhage. Large remote left cerebellar infarct, with additional small remote left frontal cortical infarct and remote lacunar infarcts within the left basal ganglia. Chest x-ray: Minimal left base atelectasis or infiltrate. Pt  currently afebrile, but did have a fever yesterday. BSE ordered to evaluate safety for po intake.   Assessment / Plan / Recommendation Clinical Impression  Pt seen at bedside for clinical evaluation of swallow. RN reports that he has been giving Pt some clear liquids today and po meds whole with water. Pt demonstrates generalized oral weakness and deconditioning in setting of DTs and possible new "tiny acute/early subacute ischemic infarct". Pt with previous left cerebellar and remote lacunar infarcts within left basal ganglia (2016 or before). It is difficult to determine baseline due to current cognitive status. Pt has moments of lucidity (able to tell me about his wife) and then confusion (seeing a bear in the room and attempting to eat washcloth). Oral care completed and pt accepting of po trials with verbal and tactile cues. Pt presents with cognitive based dysphagia, which negatively impacts pharyngeal phase (suspect difficulty coordinating respiration with swallow at times) as evidenced by occasional coughing after drinking thin liquids. Pt tolerated several swallows of thin via straw initially before blowing into straw, inhaling, and reflexively coughing. SLP assisted RN with administration of po meds whole with thin and also presenting Prostat via teaspoon presentations. Pt's ability to safely tolerate po will depend on his fluctuations in cognition. SLP worked with pt 2x today and both times he was found horizontally in the bed making him an unlikely candidate for NG. Recommend changing clear liquid diet to nectar-thick liquids and D1/puree with 100% feeder assist and strict aspiration and reflux precautions. Pt will need assist with oral care. SLP will follow for diet tolerance and upgrades as appropriate. Pt may benefit from MBSS before transfer  to SNF if possible. Hopefully, Pt will be able to participate on Monday if still here.     Aspiration Risk  Moderate aspiration risk    Diet  Recommendation Dysphagia 1 (Puree);Nectar-thick liquid   Liquid Administration via: Cup;Straw Medication Administration: Whole meds with liquid Supervision: Staff to assist with self feeding;Full supervision/cueing for compensatory strategies Compensations: Minimize environmental distractions;Slow rate;Lingual sweep for clearance of pocketing;Clear throat intermittently Postural Changes: Seated upright at 90 degrees;Remain upright for at least 30 minutes after po intake    Other  Recommendations Oral Care Recommendations: Oral care BID;Staff/trained caregiver to provide oral care Other Recommendations: Order thickener from pharmacy;Prohibited food (jello, ice cream, thin soups);Remove water pitcher;Clarify dietary restrictions   Follow up Recommendations Skilled Nursing facility      Frequency and Duration min 2x/week  1 week       Prognosis Prognosis for Safe Diet Advancement: Fair Barriers to Reach Goals: Cognitive deficits;Behavior      Swallow Study   General Date of Onset: 07/22/16 HPI: George Tysdal Campleseis a 61 y.o.malewith medical history significant of previous CVA with left sided weakness, etoh abuse last drink 12 days ago, chronic pain, HTN, CKD 4, CAD , COPD comes in with frequent falls and says he is weaker than normal. In the ER he was found to be severely dehydrated, was severely hypokalemic and ARF. With hydration and electrolyte supplementation his electrolyte abnormalities and ARF have resolved, he is now in full blown DTs and has developed diarrhea for which C. difficile infection is being ruled out. Severe dehydration, ARF with severe hyperkalemia &hypomagnesemia . This was from poor oral intake, Replaced potassium &Mag aggressively, ARF resolved, He developed diarrhea night of 07/26/2016 causing further potassium and magnesium loss, electrolytes will be replaced again. MRI shows: Punctate 4 mm focus of diffusion abnormality within the cortical gray matter of  the left parietal lobe, suspicious for tiny acute/early subacute ischemic infarct. No associated hemorrhage. Large remote left cerebellar infarct, with additional small remote left frontal cortical infarct and remote lacunar infarcts within the left basal ganglia. Chest x-ray: Minimal left base atelectasis or infiltrate. Pt currently afebrile, but did have a fever yesterday. BSE ordered to evaluate safety for po intake. Type of Study: Bedside Swallow Evaluation Previous Swallow Assessment: None on record Diet Prior to this Study: Thin liquids (clear liquids) Temperature Spikes Noted: No Respiratory Status: Room air History of Recent Intubation: No Behavior/Cognition: Confused;Lethargic/Drowsy Oral Cavity Assessment: Within Functional Limits Oral Care Completed by SLP: Yes Oral Cavity - Dentition: Adequate natural dentition Vision: Impaired for self-feeding Self-Feeding Abilities: Needs assist Patient Positioning: Upright in bed Baseline Vocal Quality: Normal;Low vocal intensity Volitional Cough: Cognitively unable to elicit Volitional Swallow: Able to elicit    Oral/Motor/Sensory Function Overall Oral Motor/Sensory Function: Generalized oral weakness Facial ROM: Within Functional Limits Facial Symmetry: Within Functional Limits Facial Strength: Reduced right;Reduced left Facial Sensation: Within Functional Limits Lingual ROM: Reduced right;Reduced left Lingual Symmetry: Within Functional Limits Lingual Strength: Reduced Lingual Sensation: Within Functional Limits Velum: Within Functional Limits Mandible: Within Functional Limits   Ice Chips Ice chips: Impaired Presentation: Spoon Oral Phase Impairments: Reduced lingual movement/coordination;Poor awareness of bolus Oral Phase Functional Implications: Prolonged oral transit Pharyngeal Phase Impairments: Suspected delayed Swallow;Cough - Immediate   Thin Liquid Thin Liquid: Impaired Presentation: Cup;Straw Pharyngeal  Phase  Impairments: Suspected delayed Swallow;Multiple swallows;Cough - Delayed    Nectar Thick Nectar Thick Liquid: Impaired Presentation: Cup;Straw;Self Fed Oral phase functional implications: Prolonged oral transit Pharyngeal Phase Impairments: Cough - Delayed  Honey Thick Honey Thick Liquid: Not tested   Puree Puree: Impaired Presentation: Spoon Oral Phase Impairments: Reduced lingual movement/coordination (verbal cues required) Oral Phase Functional Implications: Prolonged oral transit Pharyngeal Phase Impairments: Cough - Delayed   Solid      Solid: Impaired Presentation: Spoon Oral Phase Impairments: Reduced lingual movement/coordination Oral Phase Functional Implications: Prolonged oral transit       Thank you,  Genene Churn, [place]  Johathan Province 07/28/2016,5:07 PM

## 2016-07-28 NOTE — Progress Notes (Addendum)
PROGRESS NOTE    George Valdez  [id number] DOB: 1955/09/09 DOA: 07/22/2016 PCP: Jana Half    Brief Narrative:  61 yo male with chronic etho abuse presents with weakness and frequent falls. On the initial examination he was dehydrated, hypotensive, bradycardic and hypokalemic. During his hospitalization has developed delirium tremens. Currently on benzodiazepines and antipsychotics. Still very confused and combative.   Assessment & Plan:   Principal Problem:   Hypokalemia Active Problems:   Sinus bradycardia   CAD (coronary artery disease)   PVD (peripheral vascular disease) (HCC)   Acute renal failure (ARF) ([facility])   Falls   Weakness of left side of body   h/o CVA (cerebral vascular accident) ([place]) with left sided weakness   Alcohol abuse   Hypotension   1. Dehydration with AKI. Renal function has recovered, cr down to 0.93, will continue maintenance IV fluids with D5 /2 saline, to prevent worsening hyperchloremia. Will follow renal panel in am, avoid hypotension and nephrotoxic medications. Note patient with poor oral intake.   2. Hypokalemia. Continue aggressive correction of K with kcl IV and PO, will give 80 meq today, follow renal panel in am, will continue hydration with IV dextrose and 1/2 saline at 75 cc/hr. Mag at 2.   3. CAD. No chest pain. Continue asa and clopidogrel. Continue rosuvastatin, isosorbide and metoprolol. Systolic blood pressure 90 to 100.   4. History of CVA with left side weakness. Will continue neuro checks per unit protocol.   5. Alcohol withdrawal syndrome with delirium tremens. Significant persistent metabolic encephalopathy. Will dc haldol and will continue on chlordiazepoxide, long acting benzodiazepine, will avoid antipsychotics and will resume as needed lorazepam per protocol for withdrawal. Will continue supportive IV fluids and vitamins including thiamine and folic acid. Continue duloxetine.      DVT prophylaxis:  Heparin  Code Status: Full  Family Communication: No family at the bedside. Disposition Plan: Home  Consultants:    Procedures:   Antimicrobials:   Subjective: Patient orientated x1, not able to respond simple questions. No nausea or vomiting, but very poor oral intake, combative. All information from nursing at the bedside, patient not able to give any history.   Objective: Vitals:   07/27/16 1800 07/27/16 2000 07/28/16 0400 07/28/16 0500  BP:      Pulse: (!) 121     Resp: (!) 25     Temp:  99.5 F (37.5 C) 98.5 F (36.9 C)   TempSrc:  Oral Oral   SpO2: 93%     Weight:    67.6 kg (149 lb 0.5 oz)  Height:        Intake/Output Summary (Last 24 hours) at 07/28/16 0807 Last data filed at 07/28/16 0500  Gross per 24 hour  Intake           923.33 ml  Output             1250 ml  Net          -326.67 ml   Filed Weights   07/26/16 0500 07/27/16 0500 07/28/16 0500  Weight: 71.5 kg (157 lb 10.1 oz) 67.9 kg (149 lb 11.1 oz) 67.6 kg (149 lb 0.5 oz)    Examination:  General exam: deconditioned and ill looking appearing. E ENT: conjunctival pallor, no icterus, dry oral mucosa.  Respiratory system: Positive bilateral scattered rhonchi, no wheezing or rales. Respiratory effort normal.  Cardiovascular system: S1 & S2 heard, RRR. No JVD, murmurs, rubs, gallops or clicks. No pedal edema. Gastrointestinal  system: Abdomen is nondistended, soft and nontender. No organomegaly or masses felt. Normal bowel sounds heard. Central nervous system: awake, orientated only times person, follows simple commands. Disorientated, not agitated. Nonfocal.  Extremities: Symmetric 5 x 5 power. Skin: No rashes, lesions or ulcers      Data Reviewed: I have personally reviewed following labs and imaging studies  CBC:  Recent Labs Lab 07/22/16 1820 07/23/16 0553 07/26/16 0902 07/27/16 0431 07/28/16 0454  WBC 11.9* 11.9* 10.5 9.9 8.9  NEUTROABS 9.7*  --   --   --   --   HGB 13.8 13.3 12.2*  12.6* 11.1*  HCT 40.2 39.1 34.7* 35.7* 33.3*  MCV 91.8 92.4 91.1 90.2 91.0  PLT 283 289 196 277 [id number]   Basic Metabolic Panel:  Recent Labs Lab 07/24/16 0608 07/25/16 0500 07/26/16 0902 07/26/16 0929 07/27/16 0431 07/27/16 0942 07/28/16 0454  NA 141 144 135 135 135 135  --   K 2.5* 4.3 3.1* 3.2* 2.5* 2.4*  --   CL 116* 120* 109 108 107 107  --   CO2 19* 19* 20* 21* 19* 20*  --   GLUCOSE 121* 127* 126* 113* 89 110*  --   BUN 15 12 8 8 7 6   --   CREATININE 1.28* 0.98 0.81 0.99 0.91 0.93  --   CALCIUM 7.5* 7.0* 7.3* 7.6* 7.7* 7.8*  --   MG 1.5* 2.0 1.3*  --  1.3*  --  2.0   GFR: Estimated Creatinine Clearance: 69.8 mL/min (by C-G formula based on SCr of 0.93 mg/dL). Liver Function Tests:  Recent Labs Lab 07/22/16 1820 07/25/16 0500 07/26/16 0929 07/27/16 0942  AST 25 28 26 24   ALT 17 19 22 22   ALKPHOS 81 71 83 73  BILITOT 0.6 0.5 0.9 0.9  PROT 6.3* 5.6* 6.3* 5.9*  ALBUMIN 2.9* 2.5* 2.9* 2.7*   No results for input(s): LIPASE, AMYLASE in the last 168 hours.  Recent Labs Lab 07/22/16 1821  AMMONIA 24   Coagulation Profile: No results for input(s): INR, PROTIME in the last 168 hours. Cardiac Enzymes:  Recent Labs Lab 07/22/16 1820 07/22/16 2030 07/23/16 0132 07/23/16 0553 07/23/16 1158  TROPONINI 0.04* 0.04* 0.04* 0.06* 0.06*   BNP (last 3 results) No results for input(s): PROBNP in the last 8760 hours. HbA1C: No results for input(s): HGBA1C in the last 72 hours. CBG: No results for input(s): GLUCAP in the last 168 hours. Lipid Profile: No results for input(s): CHOL, HDL, LDLCALC, TRIG, CHOLHDL, LDLDIRECT in the last 72 hours. Thyroid Function Tests: No results for input(s): TSH, T4TOTAL, FREET4, T3FREE, THYROIDAB in the last 72 hours. Anemia Panel: No results for input(s): VITAMINB12, FOLATE, FERRITIN, TIBC, IRON, RETICCTPCT in the last 72 hours. Sepsis Labs:  Recent Labs Lab 07/22/16 1821 07/22/16 2308  PROCALCITON  --  <0.10  LATICACIDVEN  0.8  --     Recent Results (from the past 240 hour(s))  MRSA PCR Screening     Status: None   Collection Time: 07/22/16 11:35 PM  Result Value Ref Range Status   MRSA by PCR NEGATIVE NEGATIVE Final    Comment:        The GeneXpert MRSA Assay (FDA approved for NASAL specimens only), is one component of a comprehensive MRSA colonization surveillance program. It is not intended to diagnose MRSA infection nor to guide or monitor treatment for MRSA infections.          Radiology Studies: No results found.      Scheduled  Meds: . aspirin EC  81 mg Oral Daily  . chlordiazePOXIDE  5 mg Oral TID  . clopidogrel  75 mg Oral Daily  . cyanocobalamin  1,000 mcg Intramuscular Q30 days  . DULoxetine  60 mg Oral Daily  . feeding supplement (PRO-STAT SUGAR FREE 64)  30 mL Oral TID WC  . folic acid  1 mg Oral Daily  . heparin subcutaneous  5,000 Units Subcutaneous Q8H  . Influenza vac split quadrivalent PF  0.5 mL Intramuscular Tomorrow-1000  . isosorbide mononitrate  10 mg Oral Daily  . metoprolol succinate  50 mg Oral BID WC  . multivitamin with minerals  1 tablet Oral Daily  . pneumococcal 23 valent vaccine  0.5 mL Intramuscular Tomorrow-1000  . rosuvastatin  40 mg Oral q1800  . thiamine  100 mg Intravenous Daily  . tiotropium  18 mcg Inhalation Daily   Continuous Infusions: . 0.9 % sodium chloride with kcl 100 mL/hr at 07/27/16 1440     LOS: 6 days      George Trice Gerome Apley, MD [facility] Pager [phone]  If 7PM-7AM, please contact night-coverage [email or web address] Password [email or web address] 07/28/2016, 8:07 AM

## 2016-07-28 NOTE — Progress Notes (Signed)
Physical Therapy Treatment Patient Details Name: George Valdez MRN: [id number] DOB: 06-Feb-1956 Today's Date: 07/28/2016    History of Present Illness 61 y.o. male with medical history significant of previous CVA with left sided weakness, etoh abuse last drink 12 days ago, chronic pain, HTN, CAD , COPD comes in with frequent falls and says he is weaker than normal.  Pt reports his left knee "just goes out" and "buckles" and he falls.  He denies any LOC.  He denies any recent illnesses.  No fevers.  No n/v/d.  Pt appears disheveled and has multiple scratches and open wounds to arms he says is due to his falls.  He denies any pain anywhere.  Denies any sob, cough or flu like symptoms.  He reports his left side may have been more weak than the other but overall he says he just felt weak.  Pt found to have k level of 2.2 and low bp and HR.  EMS gave him a dose of atropine in the field.  Pt has been given several liters of ivf and he says he is feeling much better.  He is referred for admission for low bp, low k level, dehydration with AKI.    PT Comments    Pt sidelying in bed and willing to participate with therapist.  Due to time of day and RN request no transfer complete this session.  Session focus on bed mobility and sitting balance activities.  Pt presents with mod A for supine to sitting with increased time due to motor control.  Pt with increased difficulty without HHA while sitting required A to reduce posture Rt lean.  Pt able to transfer toward [facility] independently following cueing for technique.  Pt left in bed with call bell within reach and RN aware of status.    Follow Up Recommendations        Equipment Recommendations       Recommendations for Other Services       Precautions / Restrictions Precautions Precautions: Fall Precaution Comments: Pt has had multiple falls - he states it is related to his L sided weakness.      Mobility  Bed Mobility Overal bed mobility:  Independent Bed Mobility: Supine to Sit     Supine to sit: Mod assist;HOB elevated     General bed mobility comments: Poor initiation, increased time to complete supine to sitting on EOB.  Due to time of day, RN requests no transfer as was difficult to transfer back to bed (required 3 max A).  Presents with poor sitting balance  Transfers                    Ambulation/Gait                 Stairs            Wheelchair Mobility    Modified Rankin (Stroke Patients Only)       Balance                                    Cognition Arousal/Alertness: Awake/alert Behavior During Therapy: WFL for tasks assessed/performed Overall Cognitive Status: Impaired/Different from baseline Area of Impairment: Orientation Orientation Level: Disoriented to;Place;Time                  Exercises      General Comments        Pertinent Vitals/Pain  Pain Assessment: No/denies pain    Home Living                      Prior Function            PT Goals (current goals can now be found in the care plan section)      Frequency           PT Plan      Co-evaluation             End of Session   Activity Tolerance: Patient limited by fatigue Patient left: in bed;with call bell/phone within reach;with nursing/sitter in room     Time: 1705-1725 PT Time Calculation (min) (ACUTE ONLY): 20 min  Charges:  $Therapeutic Activity: 8-22 mins                    G Codes:     Ihor Austin, LPTA; CBIS [phone]  Aldona Lento 07/28/2016, 5:26 PM

## 2016-07-29 DIAGNOSIS — I739 Peripheral vascular disease, unspecified: Secondary | ICD-10-CM

## 2016-07-29 DIAGNOSIS — I2510 Atherosclerotic heart disease of native coronary artery without angina pectoris: Secondary | ICD-10-CM

## 2016-07-29 DIAGNOSIS — R531 Weakness: Secondary | ICD-10-CM

## 2016-07-29 DIAGNOSIS — I2583 Coronary atherosclerosis due to lipid rich plaque: Secondary | ICD-10-CM

## 2016-07-29 LAB — BASIC METABOLIC PANEL
ANION GAP: 5 (ref 5–15)
BUN: 9 mg/dL (ref 6–20)
CALCIUM: 8.4 mg/dL — AB (ref 8.9–10.3)
CO2: 21 mmol/L — ABNORMAL LOW (ref 22–32)
Chloride: 115 mmol/L — ABNORMAL HIGH (ref 101–111)
Creatinine, Ser: 0.87 mg/dL (ref 0.61–1.24)
GFR calc Af Amer: >60 mL/min (ref >60)
GLUCOSE: 120 mg/dL — AB (ref 65–99)
Potassium: 2.9 mmol/L — ABNORMAL LOW (ref 3.5–5.1)
Sodium: 141 mmol/L (ref 135–145)

## 2016-07-29 LAB — MAGNESIUM: MAGNESIUM: 1.7 mg/dL (ref 1.7–2.4)

## 2016-07-29 MED ORDER — QUETIAPINE FUMARATE 25 MG PO TABS
50.0000 mg | ORAL_TABLET | Freq: Every day | ORAL | Status: DC
  Administered 2016-07-29: 50 mg via ORAL
  Filled 2016-07-29 (×2): qty 1
  Filled 2016-07-29: qty 2
  Filled 2016-07-29: qty 1

## 2016-07-29 MED ORDER — SODIUM CHLORIDE 0.9 % IV SOLN: 30.0000 meq | Freq: Once | INTRAVENOUS | Status: DC

## 2016-07-29 MED ORDER — POTASSIUM CHLORIDE CRYS ER 20 MEQ PO TBCR
40.0000 meq | EXTENDED_RELEASE_TABLET | Freq: Three times a day (TID) | ORAL | Status: DC
  Administered 2016-07-29 (×3): 40 meq via ORAL
  Filled 2016-07-29 (×3): qty 2

## 2016-07-29 MED ORDER — POTASSIUM CHLORIDE 10 MEQ/100ML IV SOLN
10.0000 meq | INTRAVENOUS | Status: AC
  Administered 2016-07-29 (×3): 10 meq via INTRAVENOUS

## 2016-07-29 MED ORDER — LORAZEPAM 2 MG/ML IJ SOLN
4.0000 mg | INTRAMUSCULAR | Status: DC | PRN
  Administered 2016-07-29 – 2016-07-30 (×8): 4 mg via INTRAVENOUS
  Filled 2016-07-29 (×8): qty 2

## 2016-07-29 MED ORDER — HALOPERIDOL LACTATE 5 MG/ML IJ SOLN
5.0000 mg | Freq: Once | INTRAMUSCULAR | Status: AC
  Administered 2016-07-29: 5 mg via INTRAVENOUS
  Filled 2016-07-29: qty 1

## 2016-07-29 MED ORDER — VITAMIN B-1 100 MG PO TABS
100.0000 mg | ORAL_TABLET | Freq: Every day | ORAL | Status: DC
  Administered 2016-07-29 – 2016-08-04 (×5): 100 mg via ORAL
  Filled 2016-07-29 (×5): qty 1

## 2016-07-29 MED ORDER — KCL IN DEXTROSE-NACL 20-5-0.9 MEQ/L-%-% IV SOLN
INTRAVENOUS | Status: DC
  Administered 2016-07-29 (×2): via INTRAVENOUS

## 2016-07-29 NOTE — Clinical Social Work Note (Signed)
Pt referred for SNF. Pt has been very confused and combative. CIWA score this morning 18. CSW will complete assessment for d/c planning when appropriate.  Benay Pike, [place]

## 2016-07-29 NOTE — Progress Notes (Signed)
PROGRESS NOTE    George Valdez  [id number] DOB: [birth year] DOA: 07/22/2016 PCP: George Valdez    Brief Narrative:  61 yo male with chronic etho abuse presents with weakness and frequent falls. On the initial examination he was dehydrated, hypotensive, bradycardic and hypokalemic. During his hospitalization has developed delirium tremens. Currently on benzodiazepines and antipsychotics. Still very confused and combative.   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 Dehydration with AKI. Renal function has recovered, cr down to 0.93, will continue maintenance IV fluids with D5 /2 saline, to prevent worsening hyperchloremia. Will follow renal panel in am, avoid hypotension and nephrotoxic medications. Note patient with poor oral intake.   2. Hypokalemia. Continue aggressive correction of K with kcl IV and PO, normal magnesium noted, ordered oral potassium replacement and IV replacement, follow renal panel in am, see orders.   3. CAD. No chest pain. Continue asa and clopidogrel. Continue rosuvastatin, isosorbide and metoprolol. Systolic blood pressure 90 to 100.   4. History of CVA with left side weakness. Will continue neuro checks per unit protocol.   5. Alcohol withdrawal syndrome with delirium tremens. Significant persistent metabolic encephalopathy. will continue on chlordiazepoxide, continue lorazepam as needed. Will continue supportive IV fluids and vitamins including thiamine and folic acid. Continue duloxetine.  Add seroquel QHS.      DVT prophylaxis: Heparin  Code Status: Full  Family Communication: No family at the bedside. Disposition Plan:  Home   Procedures:   Antimicrobials:   Subjective:  Pt is still very confused at night.  He had to get a dose of haldol overnight to help calm him down.     Objective: Vitals:   07/29/16 0300 07/29/16 0400 07/29/16 0500 07/29/16 0600  BP:      Pulse: 83 85 81 91  Resp: (!) 27 (!) 24 18 (!) 28  Temp:  98.3 F (36.8 C)    TempSrc:  Axillary    SpO2: 95% 97% 94% 95%  Weight:   68.5 kg (151 lb 0.2 oz)   Height:        Intake/Output Summary (Last 24 hours) at 07/29/16 0740 Last data filed at 07/29/16 0500  Gross per 24 hour  Intake              260 ml  Output             1550 ml  Net            -1290 ml   Filed Weights   07/27/16 0500 07/28/16 0500 07/29/16 0500  Weight: 67.9 kg (149 lb 11.1 oz) 67.6 kg (149 lb 0.5 oz) 68.5 kg (151 lb 0.2 oz)    Examination:  General exam: deconditioned chronically ill appearing, vocalizing but still confused E ENT: conjunctival pallor, no icterus, dry oral mucosa.  Respiratory system:  no wheezing or rales. Respiratory effort normal.  Cardiovascular system: S1 & S2 heard. No JVD, murmurs, rubs, gallops or clicks. No pedal edema. Gastrointestinal system: Abdomen is nondistended, soft and nontender. No organomegaly or masses felt. Normal bowel sounds heard. Central nervous system: awake, orientated only times person, follows simple commands. Disorientated, not  agitated. Nonfocal.  Extremities: Symmetric 5 x 5 power. Skin: No rashes, lesions or ulcers   Data Reviewed: I have personally reviewed following labs and imaging studies  CBC:  Recent Labs Lab 07/22/16 1820 07/23/16 0553 07/26/16 0902 07/27/16 0431 07/28/16 0454  WBC 11.9* 11.9* 10.5 9.9 8.9  NEUTROABS 9.7*  --   --   --   --   HGB 13.8 13.3 12.2* 12.6* 11.1*  HCT 40.2 39.1 34.7* 35.7* 33.3*  MCV 91.8 92.4 91.1 90.2 91.0  PLT 283 289 196 277 [id number]   Basic Metabolic Panel:  Recent Labs Lab 07/25/16 0500 07/26/16 0902 07/26/16 0929 07/27/16 0431 07/27/16 0942  07/28/16 0454 07/28/16 0817 07/29/16 0436  NA 144 135 135 135 135  --  138 141  K 4.3 3.1* 3.2* 2.5* 2.4*  --  2.7* 2.9*  CL 120* 109 108 107 107  --  112* 115*  CO2 19* 20* 21* 19* 20*  --  18* 21*  GLUCOSE 127* 126* 113* 89 110*  --  111* 120*  BUN 12 8 8 7 6   --  8 9  CREATININE 0.98 0.81 0.99 0.91 0.93  --  0.93 0.87  CALCIUM 7.0* 7.3* 7.6* 7.7* 7.8*  --  8.1* 8.4*  MG 2.0 1.3*  --  1.3*  --  2.0  --  1.7   GFR: Estimated Creatinine Clearance: 74.7 mL/min (by C-G formula based on SCr of 0.87 mg/dL). Liver Function Tests:  Recent Labs Lab 07/22/16 1820 07/25/16 0500 07/26/16 0929 07/27/16 0942  AST 25 28 26 24   ALT 17 19 22 22   ALKPHOS 81 71 83 73  BILITOT 0.6 0.5 0.9 0.9  PROT 6.3* 5.6* 6.3* 5.9*  ALBUMIN 2.9* 2.5* 2.9* 2.7*   No results for input(s): LIPASE, AMYLASE in the last 168 hours.  Recent Labs Lab 07/22/16 1821  AMMONIA 24   Coagulation Profile: No results for input(s): INR, PROTIME in the last 168 hours. Cardiac Enzymes:  Recent Labs Lab 07/22/16 1820 07/22/16 2030 07/23/16 0132 07/23/16 0553 07/23/16 1158  TROPONINI 0.04* 0.04* 0.04* 0.06* 0.06*   BNP (last 3 results) No results for input(s): PROBNP in the last 8760 hours. HbA1C: No results for input(s): HGBA1C in the last 72 hours. CBG:  Recent Labs Lab 07/28/16 0923  GLUCAP 125*   Lipid Profile: No results for input(s): CHOL, HDL, LDLCALC, TRIG, CHOLHDL, LDLDIRECT in the last 72 hours. Thyroid Function Tests: No results for input(s): TSH, T4TOTAL, FREET4, T3FREE, THYROIDAB in the last 72 hours. Anemia Panel: No results for input(s): VITAMINB12, FOLATE, FERRITIN, TIBC, IRON, RETICCTPCT in the last 72 hours. Sepsis Labs:  Recent Labs Lab 07/22/16 1821 07/22/16 2308  PROCALCITON  --  <0.10  LATICACIDVEN 0.8  --     Recent Results (from the past 240 hour(s))  MRSA PCR Screening     Status: None   Collection Time: 07/22/16 11:35 PM  Result Value Ref Range Status   MRSA by  PCR NEGATIVE NEGATIVE Final    Comment:        The GeneXpert MRSA Assay (FDA approved for NASAL specimens only), is one component of a comprehensive MRSA colonization surveillance program. It is not intended to diagnose MRSA infection nor to guide or monitor treatment for MRSA infections.     Radiology Studies: No results found.  Scheduled Meds: . aspirin EC  81 mg Oral Daily  . chlordiazePOXIDE  5 mg Oral TID  . clopidogrel  75 mg Oral Daily  .  cyanocobalamin  1,000 mcg Intramuscular Q30 days  . DULoxetine  60 mg Oral Daily  . feeding supplement (PRO-STAT SUGAR FREE 64)  30 mL Oral TID WC  . folic acid  1 mg Oral Daily  . heparin subcutaneous  5,000 Units Subcutaneous Q8H  . Influenza vac split quadrivalent PF  0.5 mL Intramuscular Tomorrow-1000  . isosorbide mononitrate  10 mg Oral Daily  . metoprolol succinate  50 mg Oral BID WC  . multivitamin with minerals  1 tablet Oral Daily  . pneumococcal 23 valent vaccine  0.5 mL Intramuscular Tomorrow-1000  . potassium chloride  10 mEq Intravenous Q1 Hr x 3  . potassium chloride  40 mEq Oral TID  . rosuvastatin  40 mg Oral q1800  . thiamine  100 mg Oral Daily  . tiotropium  18 mcg Inhalation Daily   Continuous Infusions: . dextrose 5 % and 0.9 % NaCl with KCl 20 mEq/L     Crittical Care Time Spent 40 mins   LOS: 7 days   Irwin Brakeman, MD [facility] Pager [phone]  If 7PM-7AM, please contact night-coverage [email or web address] Password [email or web address] 07/29/2016, 7:40 AM

## 2016-07-29 NOTE — Progress Notes (Signed)
Initial Nutrition Assessment  DOCUMENTATION CODES:  Severe malnutrition in context of acute illness/injury   Pt meets criteria for SEVERE MALNUTRITION in the context of Acute Illness as evidenced by an estimated energy intake that has met < or equal to 50% of needs for > or equal to 5 days and a wt loss of >4% bw in the last week.  INTERVENTION:  With help of Dextrose containing IVF, Pt is only estimated to be meeting ~25% of kcal/pro needs, due to his frequent strenuous activity, burning a large number of calories. Malnourished in acute context  Best nutritional option would be placement of small bore postpyloric feeding tube.   Pt would obviously need to be placed in restraints to reduce chance of tube displacement and prevent him from putting himself in position that is high risk for TF aspiration.   NUTRITION DIAGNOSIS:  Inadequate oral intake related to  (Delirium, withdrawl) as evidenced by  an estimated energy intake that has met < or equal to 50% of needs for > or equal to 5 days and a wt loss of >4% bw in the last week.  GOAL:  Patient will meet greater than or equal to 90% of their needs  MONITOR:  PO intake, Diet advancement, Labs, Weight trends, I & O's  REASON FOR ASSESSMENT:  Low [place]    ASSESSMENT:  61 y/o male PMHx CVA, HTN, CAD, PVD, COPD, Depression, Anxiety, Chronic Pain and etoh abuse. Presented with frequent falls and weakness. Found to have K of 2.2, hypotension, low HR and have AKI.  Has had severe DTs  RD was notified by ST yesterday of patients prolonged period w/o PO intake. He was placed on diet yesterday, but RN today says he has eaten only "bites". Since admission the most he has been documented as eating is 25% of a single meal. He has been getting some kcals and Protein from Prostat. Also receiving some kcals from D5 IVF  Both times he has been seen, he has been extremely active in bed with constant twisting, turning and attempting to remove mits.  Undoubtedly burning high number of calories from his constant physical activity.  Briefly spoke with wife at bedside. She said at baseline, the patient would only eat 1 meal a day. The wife was recently in rehab for the past few months and feels this played a large role in the patients decline.   Wife gives UBW of 180 lbs. Per chart review, he has lost 30 lbs in the last 7 months. This equates to ~15% bw  Medications: Librium, vit B12, Prostat, mvi with min, kcl, Seroquel, ativan, thiamin, folate. D5 IVF @ 75-providing 13 kcal/hr Labs: Hypokalemia improving, BG 110-130, Albumin: 2.7, MAG: WDL     Recent Labs Lab 07/27/16 0431 07/27/16 0942 07/28/16 0454 07/28/16 0817 07/29/16 0436  NA 135 135  --  138 141  K 2.5* 2.4*  --  2.7* 2.9*  CL 107 107  --  112* 115*  CO2 19* 20*  --  18* 21*  BUN 7 6  --  8 9  CREATININE 0.91 0.93  --  0.93 0.87  CALCIUM 7.7* 7.8*  --  8.1* 8.4*  MG 1.3*  --  2.0  --  1.7  GLUCOSE 89 110*  --  111* 120*   Diet Order:  DIET - DYS 1 Room service appropriate? Yes; Fluid consistency: Nectar Thick  Skin:  Abrasions/excoriated nose, face, chest, arm   Last BM:  2/2- Diarrhea  Height:  Ht Readings from Last 1 Encounters:  07/22/16 '5\' 4"'  (1.626 m)   Weight:  Wt Readings from Last 1 Encounters:  07/29/16 151 lb 0.2 oz (68.5 kg)   Wt Readings from Last 10 Encounters:  07/29/16 151 lb 0.2 oz (68.5 kg)  12/03/15 179 lb 4.5 oz (81.3 kg)  11/16/15 198 lb (89.8 kg)  01/27/15 215 lb (97.5 kg)  12/30/14 218 lb (98.9 kg)  12/09/14 208 lb (94.3 kg)  10/02/14 218 lb (98.9 kg)  09/22/14 194 lb (88 kg)  09/10/14 193 lb (87.5 kg)   Ideal Body Weight:  59.09 kg  BMI:  Body mass index is 25.92 kg/m.  Estimated Nutritional Needs:  Kcal:  1700-1900 kcals (25-28 kcal/kg bw) Protein:  75-90 g Pro (1.1-1.3 g/kg bw) Fluid:  >2 Liters (30 ml/kg bw)  EDUCATION NEEDS:  No education needs identified at this time  [name] RD, LDN, [place]  Nutrition Pager: [phone] 07/29/2016 4:42 PM

## 2016-07-29 NOTE — Progress Notes (Signed)
0707 K+ 2.9 this AM lab, MD notified.

## 2016-07-30 DIAGNOSIS — E43 Unspecified severe protein-calorie malnutrition: Secondary | ICD-10-CM | POA: Insufficient documentation

## 2016-07-30 LAB — CBC
HCT: 32.4 % — ABNORMAL LOW (ref 39.0–52.0)
HEMOGLOBIN: 10.9 g/dL — AB (ref 13.0–17.0)
MCH: 31.5 pg (ref 26.0–34.0)
MCHC: 33.6 g/dL (ref 30.0–36.0)
MCV: 93.6 fL (ref 78.0–100.0)
Platelets: 278 10*3/uL (ref 150–400)
RBC: 3.46 MIL/uL — AB (ref 4.22–5.81)
RDW: 16 % — ABNORMAL HIGH (ref 11.5–15.5)
WBC: 8.3 10*3/uL (ref 4.0–10.5)

## 2016-07-30 LAB — BASIC METABOLIC PANEL
ANION GAP: 7 (ref 5–15)
BUN: 9 mg/dL (ref 6–20)
CHLORIDE: 121 mmol/L — AB (ref 101–111)
CO2: 19 mmol/L — ABNORMAL LOW (ref 22–32)
Calcium: 8.3 mg/dL — ABNORMAL LOW (ref 8.9–10.3)
Creatinine, Ser: 0.8 mg/dL (ref 0.61–1.24)
GFR calc Af Amer: >60 mL/min (ref >60)
Glucose, Bld: 137 mg/dL — ABNORMAL HIGH (ref 65–99)
POTASSIUM: 4.2 mmol/L (ref 3.5–5.1)
SODIUM: 147 mmol/L — AB (ref 135–145)

## 2016-07-30 MED ORDER — HALOPERIDOL LACTATE 5 MG/ML IJ SOLN
5.0000 mg | Freq: Once | INTRAMUSCULAR | Status: DC
  Filled 2016-07-30: qty 1

## 2016-07-30 MED ORDER — DEXMEDETOMIDINE HCL IN NACL 200 MCG/50ML IV SOLN
0.2000 ug/kg/h | INTRAVENOUS | Status: AC
  Administered 2016-07-30: 0.2 ug/kg/h via INTRAVENOUS
  Administered 2016-07-30: 0.3 ug/kg/h via INTRAVENOUS
  Administered 2016-07-30: 0.2 ug/kg/h via INTRAVENOUS
  Administered 2016-07-31 (×2): 0.4 ug/kg/h via INTRAVENOUS
  Filled 2016-07-30 (×6): qty 50

## 2016-07-30 MED ORDER — LORAZEPAM 2 MG/ML IJ SOLN
INTRAMUSCULAR | Status: AC
  Filled 2016-07-30: qty 3

## 2016-07-30 MED ORDER — LORAZEPAM 2 MG/ML IJ SOLN
2.0000 mg/h | INTRAVENOUS | Status: DC
  Administered 2016-07-30: 2 mg/h via INTRAVENOUS
  Filled 2016-07-30: qty 25

## 2016-07-30 MED ORDER — KCL IN DEXTROSE-NACL 20-5-0.45 MEQ/L-%-% IV SOLN
INTRAVENOUS | Status: DC
  Administered 2016-07-30 – 2016-08-01 (×5): via INTRAVENOUS
  Administered 2016-08-02: 1000 mL via INTRAVENOUS
  Administered 2016-08-02: 03:00:00 via INTRAVENOUS

## 2016-07-30 NOTE — Progress Notes (Signed)
1252 Patient noted moderately sedated and sleeping most of the day, Precedex gtt titrated down to 0.76mcg at this time. Will continue to monitor.

## 2016-07-30 NOTE — Progress Notes (Signed)
37 spoke with patient's wife [name] and telephone consent was given for insertion of PICC line. Charissa Bash stated "Please do whatever you can to help him." Telephone consent verified with [facility] nurse Toma Copier, RN.

## 2016-07-30 NOTE — Progress Notes (Addendum)
Nutrition Follow-up  DOCUMENTATION CODES:  Severe malnutrition in context of acute illness/injury   Pt meets criteria for SEVERE MALNUTRITION in the context of Acute Illness  INTERVENTION:  Nursing unable to place NGT due to edema/difficult to navigate nasal canal. A small bore, 10 [place], soft tipped tube may be much more easier to pass. At [facility], the small bores are kept in materials or in IR.   See PICC line is to be placed.   Do not believe TPN is in best interest of patient due to increased risk of infection, hyperglycemia, cholecystitis, gut bacteria translocation and fungemia. Furthermore, would lose function of gut associated lymphoid tissue and gut may atrophy.  EN has been shown multiple times to reduce risk of infection when compared to TPN. Finally, there is a system wide shortage of TPN bags at this time; should be saved for patients with absolute contraindications.    NUTRITION DIAGNOSIS:  Inadequate oral intake related to  (Delirium, withdrawl)   Onoging  GOAL:  Patient will meet greater than or equal to 90% of their needs  MONITOR:  PO intake, Diet advancement, Labs, Weight trends, I & O's  ASSESSMENT:  61 y/o male PMHx CVA, HTN, CAD, PVD, COPD, Depression, Anxiety, Chronic Pain and etoh abuse. Presented with frequent falls and weakness. Found to have K of 2.2, hypotension, low HR and have AKI.  Has had severe DTs  Since admission the most he has been documented as eating is 25% of a single meal. He has been getting some kcals and Protein from Prostat. Also receiving some kcals from D5 IVF  Both times he has been seen, he has been extremely active in bed with constant twisting, turning and attempting to remove mits. Undoubtedly burning high number of calories from his constant physical activity.  Wife gives UBW of 180 lbs. Per chart review, he has lost 30 lbs in the last 7 months. This equates to ~15% bw  Medications: Librium, vit B12, Prostat, mvi with min,  kcl, Seroquel, ativan, thiamin, folate. D5 IVF @ 75-providing 13 kcal/hr Labs: Hypokalemia improving, BG 110-130, Albumin: 2.7, MAG: WDL     Recent Labs Lab 07/27/16 0431  07/28/16 0454 07/28/16 0817 07/29/16 0436 07/30/16 0442  NA 135  < >  --  138 141 147*  K 2.5*  < >  --  2.7* 2.9* 4.2  CL 107  < >  --  112* 115* 121*  CO2 19*  < >  --  18* 21* 19*  BUN 7  < >  --  8 9 9   CREATININE 0.91  < >  --  0.93 0.87 0.80  CALCIUM 7.7*  < >  --  8.1* 8.4* 8.3*  MG 1.3*  --  2.0  --  1.7  --   GLUCOSE 89  < >  --  111* 120* 137*  < > = values in this interval not displayed. Diet Order:  DIET - DYS 1 Room service appropriate? Yes; Fluid consistency: Nectar Thick  Skin:  Abrasions/excoriated nose, face, chest, arm   Last BM:  2/2- Diarrhea  Height:  Ht Readings from Last 1 Encounters:  07/22/16 5\' 4"  (1.626 m)   Weight:  Wt Readings from Last 1 Encounters:  07/30/16 153 lb 7 oz (69.6 kg)   Wt Readings from Last 10 Encounters:  07/30/16 153 lb 7 oz (69.6 kg)  12/03/15 179 lb 4.5 oz (81.3 kg)  11/16/15 198 lb (89.8 kg)  01/27/15 215 lb (97.5  kg)  12/30/14 218 lb (98.9 kg)  12/09/14 208 lb (94.3 kg)  10/02/14 218 lb (98.9 kg)  09/22/14 194 lb (88 kg)  09/10/14 193 lb (87.5 kg)   Ideal Body Weight:  59.09 kg  BMI:  Body mass index is 26.34 kg/m.  Estimated Nutritional Needs:  Kcal:  1700-1900 kcals (25-28 kcal/kg bw) Protein:  75-90 g Pro (1.1-1.3 g/kg bw) Fluid:  >2 Liters (30 ml/kg bw)  EDUCATION NEEDS:  No education needs identified at this time  George Valdez RD, LDN, [employer] Nutrition Pager: [phone] 07/30/2016 8:31 AM

## 2016-07-30 NOTE — Progress Notes (Signed)
[id number] Attempt x 2 made to insert OG tube as ordered. Unsuccessful d/t patient refusal and resistance to tube placement. MD notified.

## 2016-07-30 NOTE — Progress Notes (Signed)
SLP Cancellation Note  Patient Details Name: George Valdez MRN: [id number] DOB: Apr 01, 1956   Cancelled treatment:       Reason Eval/Treat Not Completed: Fatigue/lethargy limiting ability to participate;Medical issues which prohibited therapy;Patient not medically ready. Pt was administered Presedex yesterday evening and is not appropriate for PO at this time. SLP communicated with RN at length regarding current diet recommendation and communicated that SLP on call will come in for diet check later today or tomorrow upon pt being appropriate for intervention. Thank you,  [place] H. Roddie Mc, CCC-SLP Speech Language Pathologist   Wende Bushy 07/30/2016, 11:38 AM

## 2016-07-30 NOTE — Progress Notes (Signed)
0751 Attempted x 3 to insert NG tube for feeding purposes as ordered. Unable to pass through nasal canal d/t edema and injury prior to admission. MD notified and order to attempt OG tube placement first given and order for PICC line placement given. [facility] notified.

## 2016-07-30 NOTE — Progress Notes (Signed)
0818 Vascular wellness called to give ETA of 10:30am for PICC line placement.

## 2016-07-30 NOTE — Progress Notes (Signed)
Had spoken to RN and MD this morning regarding best option for nutrition moving forward. GI may be consulted to determine if they can place NGT. Patient may require PEG. TPN not indicated and is last resort.   RD to continue to follow. Will d/c TF consult as there is currently no access to administer.   [name] RD, LDN, CNSC Clinical Nutrition Pager: [phone] 07/30/2016 4:21 PM

## 2016-07-30 NOTE — Progress Notes (Signed)
PROGRESS NOTE    George Valdez  [id number] DOB: [birth year] DOA: 07/22/2016 PCP: Jana Half    Brief Narrative:  61 yo male with chronic etho abuse presents with weakness and frequent falls. On the initial examination he was dehydrated, hypotensive, bradycardic and hypokalemic. During his hospitalization has developed delirium tremens. Currently on benzodiazepines and antipsychotics. Still very confused and combative.  Had to be placed on precedex infusion.    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 Dehydration with AKI. Renal function has recovered, cr down to 0.93, Continue maintenance IV fluids,  to prevent worsening hyperchloremia. Will follow renal panel in am, avoid hypotension and nephrotoxic medications.  Note patient with poor oral intake.   2. Hypokalemia.  Repleted and following.  normal magnesium noted, follow renal panel,  see orders.   3. CAD. No chest pain. Continue asa and clopidogrel. Continue rosuvastatin, isosorbide and metoprolol. Systolic blood pressure 90 to 100.   4. History of CVA with left side weakness. Will continue neuro checks per unit protocol.   5. Alcohol withdrawal syndrome with delirium tremens. Significant persistent metabolic encephalopathy.   Pt is having severe withdrawal and delirium tremens, Pt had to be started on precedex infusion overnight.  Will insert feeding tube and start tube feeds.   Will continue supportive IV fluids and vitamins including thiamine and folic acid. Continue duloxetine.     6.  Severe protein calorie malnutrition - Insert feeding tube and start tube feeds  per dietitian recommendations.    DVT prophylaxis: Heparin  Code Status: Full  Family Communication: wife Disposition Plan:  Likely SNF  Procedures:   Antimicrobials:   Subjective:  Pt is still very confused at night.  Pt had to be placed on precedex infusion overnight.     Objective: Vitals:   07/30/16 0300 07/30/16 0400 07/30/16 0500 07/30/16 0600  BP: (!) 89/65 98/62 92/63  (!) 85/56  Pulse: 67 68 69 75  Resp: 17 16 14 14   Temp:      TempSrc:      SpO2: 94% 96% 96% 97%  Weight:   69.6 kg (153 lb 7 oz)   Height:        Intake/Output Summary (Last 24 hours) at 07/30/16 0718 Last data filed at 07/30/16 0415  Gross per 24 hour  Intake           938.43 ml  Output              875 ml  Net            63.43 ml   Filed Weights   07/28/16 0500 07/29/16 0500 07/30/16 0500  Weight: 67.6 kg (149 lb 0.5 oz) 68.5 kg (151 lb 0.2 oz) 69.6 kg (153 lb 7 oz)    Examination:  General exam: deconditioned chronically ill appearing, sedated. E ENT: conjunctival pallor, no icterus, pink oral mucosa.  Respiratory system:  no wheezing or rales. Respiratory effort normal.  Cardiovascular system: S1 & S2 heard. No JVD, murmurs, rubs, gallops or clicks. No pedal edema. Gastrointestinal system: Abdomen is nondistended, soft and nontender. No organomegaly or masses  felt. Normal bowel sounds heard. Central nervous system: awake, orientated only times person, follows simple commands. Disorientated, not agitated. Nonfocal.  Extremities: Symmetric 5 x 5 power. Skin: No rashes, lesions or ulcers   Data Reviewed: I have personally reviewed following labs and imaging studies  CBC:  Recent Labs Lab 07/26/16 0902 07/27/16 0431 07/28/16 0454 07/30/16 0442  WBC 10.5 9.9 8.9 8.3  HGB 12.2* 12.6* 11.1* 10.9*  HCT 34.7* 35.7* 33.3* 32.4*  MCV 91.1 90.2 91.0 93.6  PLT 196 277 255 0000000   Basic Metabolic Panel:  Recent Labs Lab 07/25/16 0500 07/26/16 0902  07/27/16 0431 07/27/16 0942  07/28/16 0454 07/28/16 0817 07/29/16 0436 07/30/16 0442  NA 144 135  < > 135 135  --  138 141 147*  K 4.3 3.1*  < > 2.5* 2.4*  --  2.7* 2.9* 4.2  CL 120* 109  < > 107 107  --  112* 115* 121*  CO2 19* 20*  < > 19* 20*  --  18* 21* 19*  GLUCOSE 127* 126*  < > 89 110*  --  111* 120* 137*  BUN 12 8  < > 7 6  --  8 9 9   CREATININE 0.98 0.81  < > 0.91 0.93  --  0.93 0.87 0.80  CALCIUM 7.0* 7.3*  < > 7.7* 7.8*  --  8.1* 8.4* 8.3*  MG 2.0 1.3*  --  1.3*  --  2.0  --  1.7  --   < > = values in this interval not displayed. GFR: Estimated Creatinine Clearance: 81.2 mL/min (by C-G formula based on SCr of 0.8 mg/dL). Liver Function Tests:  Recent Labs Lab 07/25/16 0500 07/26/16 0929 07/27/16 0942  AST 28 26 24   ALT 19 22 22   ALKPHOS 71 83 73  BILITOT 0.5 0.9 0.9  PROT 5.6* 6.3* 5.9*  ALBUMIN 2.5* 2.9* 2.7*   No results for input(s): LIPASE, AMYLASE in the last 168 hours. No results for input(s): AMMONIA in the last 168 hours. Coagulation Profile: No results for input(s): INR, PROTIME in the last 168 hours. Cardiac Enzymes:  Recent Labs Lab 07/23/16 1158  TROPONINI 0.06*   BNP (last 3 results) No results for input(s): PROBNP in the last 8760 hours. HbA1C: No results for input(s): HGBA1C in the last 72 hours. CBG:  Recent Labs Lab 07/28/16 0923  GLUCAP 125*   Lipid Profile: No results for input(s): CHOL, HDL, LDLCALC, TRIG, CHOLHDL, LDLDIRECT in the last 72 hours. Thyroid Function Tests: No results for input(s): TSH, T4TOTAL, FREET4, T3FREE, THYROIDAB in the last 72 hours. Anemia Panel: No results for input(s): VITAMINB12, FOLATE, FERRITIN, TIBC, IRON, RETICCTPCT in the last 72 hours. Sepsis Labs: No results for input(s): PROCALCITON, LATICACIDVEN in the last 168 hours.  Recent Results (from the past 240 hour(s))  MRSA PCR Screening     Status: None   Collection Time: 07/22/16 11:35 PM  Result Value Ref Range Status   MRSA by PCR NEGATIVE NEGATIVE Final    Comment:         The GeneXpert MRSA Assay (FDA approved for NASAL specimens only), is one component of a comprehensive MRSA colonization surveillance program. It is not intended to diagnose MRSA infection nor to guide or monitor treatment for MRSA infections.     Radiology Studies: No results found.  Scheduled Meds: . aspirin EC  81 mg Oral Daily  . chlordiazePOXIDE  5 mg Oral TID  . clopidogrel  75 mg Oral Daily  .  cyanocobalamin  1,000 mcg Intramuscular Q30 days  . DULoxetine  60 mg Oral Daily  . feeding supplement (PRO-STAT SUGAR FREE 64)  30 mL Oral TID WC  . folic acid  1 mg Oral Daily  . haloperidol lactate  5 mg Intravenous Once  . heparin subcutaneous  5,000 Units Subcutaneous Q8H  . Influenza vac split quadrivalent PF  0.5 mL Intramuscular Tomorrow-1000  . isosorbide mononitrate  10 mg Oral Daily  . metoprolol succinate  50 mg Oral BID WC  . multivitamin with minerals  1 tablet Oral Daily  . pneumococcal 23 valent vaccine  0.5 mL Intramuscular Tomorrow-1000  . potassium chloride  40 mEq Oral TID  . QUEtiapine  50 mg Oral QHS  . rosuvastatin  40 mg Oral q1800  . thiamine  100 mg Oral Daily  . tiotropium  18 mcg Inhalation Daily   Continuous Infusions: . dexmedetomidine 0.3 mcg/kg/hr (07/30/16 0530)  . dextrose 5 % and 0.45 % NaCl with KCl 20 mEq/L    . LORazepam (ATIVAN) infusion Stopped (07/30/16 0415)   Crittical Care Time Spent 43 mins   LOS: 8 days   Irwin Brakeman, MD [facility] Pager [phone]  If 7PM-7AM, please contact night-coverage [email or web address] Password [email or web address] 07/30/2016, 7:18 AM

## 2016-07-30 NOTE — Progress Notes (Signed)
0900 Patient unable to swallow PO medications at this time d/t current condition. MD notified with request for IV route change for PO medications if possible.

## 2016-07-31 DIAGNOSIS — I639 Cerebral infarction, unspecified: Secondary | ICD-10-CM

## 2016-07-31 DIAGNOSIS — E876 Hypokalemia: Secondary | ICD-10-CM

## 2016-07-31 DIAGNOSIS — E43 Unspecified severe protein-calorie malnutrition: Secondary | ICD-10-CM

## 2016-07-31 LAB — BASIC METABOLIC PANEL WITH GFR
Anion gap: 7 (ref 5–15)
BUN: 8 mg/dL (ref 6–20)
CO2: 20 mmol/L — ABNORMAL LOW (ref 22–32)
Calcium: 8.6 mg/dL — ABNORMAL LOW (ref 8.9–10.3)
Chloride: 117 mmol/L — ABNORMAL HIGH (ref 101–111)
Creatinine, Ser: 0.83 mg/dL (ref 0.61–1.24)
GFR calc Af Amer: >60 mL/min
GFR calc non Af Amer: >60 mL/min
Glucose, Bld: 122 mg/dL — ABNORMAL HIGH (ref 65–99)
Potassium: 3.6 mmol/L (ref 3.5–5.1)
Sodium: 144 mmol/L (ref 135–145)

## 2016-07-31 LAB — CBC
HCT: 32.9 % — ABNORMAL LOW (ref 39.0–52.0)
Hemoglobin: 11 g/dL — ABNORMAL LOW (ref 13.0–17.0)
MCH: 31.6 pg (ref 26.0–34.0)
MCHC: 33.4 g/dL (ref 30.0–36.0)
MCV: 94.5 fL (ref 78.0–100.0)
Platelets: 283 K/uL (ref 150–400)
RBC: 3.48 MIL/uL — ABNORMAL LOW (ref 4.22–5.81)
RDW: 15.9 % — ABNORMAL HIGH (ref 11.5–15.5)
WBC: 8.9 K/uL (ref 4.0–10.5)

## 2016-07-31 MED ORDER — CHLORHEXIDINE GLUCONATE 0.12 % MT SOLN
15.0000 mL | Freq: Two times a day (BID) | OROMUCOSAL | Status: DC
  Administered 2016-08-01 – 2016-08-04 (×8): 15 mL via OROMUCOSAL
  Filled 2016-07-31 (×6): qty 15

## 2016-07-31 MED ORDER — ORAL CARE MOUTH RINSE
15.0000 mL | Freq: Two times a day (BID) | OROMUCOSAL | Status: DC
  Administered 2016-08-01 – 2016-08-04 (×6): 15 mL via OROMUCOSAL

## 2016-07-31 MED ORDER — DEXMEDETOMIDINE HCL IN NACL 200 MCG/50ML IV SOLN
0.2000 ug/kg/h | INTRAVENOUS | Status: AC
  Administered 2016-08-01: 0.6 ug/kg/h via INTRAVENOUS
  Administered 2016-08-01 (×2): 0.4 ug/kg/h via INTRAVENOUS
  Administered 2016-08-01: 0.6 ug/kg/h via INTRAVENOUS
  Filled 2016-07-31 (×5): qty 50

## 2016-07-31 NOTE — Progress Notes (Signed)
Patient still unable to swallow PO meds at this time.

## 2016-07-31 NOTE — Progress Notes (Signed)
PROGRESS NOTE    George Valdez  [id number] DOB: 1955/12/25 DOA: 07/22/2016 PCP: Jana Half    Brief Narrative:  61 yo male with chronic etho abuse presents with weakness and frequent falls. On the initial examination he was dehydrated, hypotensive, bradycardic and hypokalemic. During his hospitalization has developed delirium tremens. Currently on benzodiazepines and antipsychotics. Still very confused and combative.  Had to be placed on precedex infusion.    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 Dehydration with AKI. Renal function has recovered, cr down to 0.93, Continue maintenance IV fluids. Will follow renal panel.  Encourage oral drinking today.  2. Hypokalemia.  Repleted and following.  normal magnesium noted, follow renal panel.   3. CAD. No chest pain. Continue asa and clopidogrel. Continue rosuvastatin, isosorbide and metoprolol. Systolic blood pressure 90 to 100.   4. History of CVA with left side weakness. Will continue neuro checks per unit protocol.   5. Alcohol withdrawal syndrome with severe delirium tremens.  Pt is having severe withdrawal and delirium tremens but slowly recovering, Pt had to be started on precedex infusion and slowly weaning it.  Unable to insert feeding tube and start tube feeds, hopefully patient will be able to take in more oral nutrition today, I asked GI to eval to see if they could get the feeding tube in, He may need PEG.   Will continue supportive IV fluids and vitamins including thiamine and folic acid. Continue duloxetine.     6.  Severe protein calorie malnutrition - As  above, unable to Insert feeding tube and start tube feeds. Encourage oral drinking today as he is weaned off precedex infusion.    DVT prophylaxis: Heparin  Code Status: Full  Family Communication: wife Disposition Plan:  Likely SNF  Procedures:   Antimicrobials:   Subjective:  Pt is still very confused but says that he is thirsty and wants to drink this morning.      Objective: Vitals:   07/31/16 0300 07/31/16 0400 07/31/16 0500 07/31/16 0600  BP: 113/73 119/72 110/69 100/68  Pulse: 73 73 72 69  Resp: (!) 24 13 18 12   Temp:  97.9 F (36.6 C)    TempSrc:  Axillary    SpO2: 96% 97% 96% 96%  Weight:   66.9 kg (147 lb 7.8 oz)   Height:        Intake/Output Summary (Last 24 hours) at 07/31/16 0734 Last data filed at 07/31/16 0600  Gross per 24 hour  Intake          1510.59 ml  Output             1075 ml  Net           435.59 ml   Filed Weights   07/29/16 0500 07/30/16 0500 07/31/16 0500  Weight: 68.5 kg (151 lb 0.2 oz) 69.6 kg (153 lb 7 oz) 66.9 kg (147 lb 7.8 oz)    Examination:  General exam: deconditioned chronically ill appearing, confused but able to answer some questions. E ENT: conjunctival pallor, no icterus, dry oral mucosa, poor dentition.  Respiratory system:  no wheezing or rales. Respiratory effort normal.  Cardiovascular system: S1 & S2 heard. No JVD, murmurs, rubs, gallops or clicks. No pedal edema. Gastrointestinal system: Abdomen is nondistended, soft and nontender. No organomegaly or masses felt. Normal bowel sounds heard. Central nervous system: awake, orientated only times person, follows simple commands. Disorientated, not agitated. Nonfocal.  Extremities: Symmetric 5 x 5 power. Skin: No rashes, lesions or ulcers   Data Reviewed: I have personally reviewed following labs and imaging studies  CBC:  Recent Labs Lab 07/26/16 0902 07/27/16 0431 07/28/16 0454 07/30/16 0442 07/31/16 0427  WBC 10.5 9.9 8.9 8.3 8.9  HGB 12.2* 12.6* 11.1* 10.9*  11.0*  HCT 34.7* 35.7* 33.3* 32.4* 32.9*  MCV 91.1 90.2 91.0 93.6 94.5  PLT 196 277 255 278 [id number]   Basic Metabolic Panel:  Recent Labs Lab 07/25/16 0500 07/26/16 0902  07/27/16 0431 07/27/16 0942 07/28/16 0454 07/28/16 0817 07/29/16 0436 07/30/16 0442 07/31/16 0427  NA 144 135  < > 135 135  --  138 141 147* 144  K 4.3 3.1*  < > 2.5* 2.4*  --  2.7* 2.9* 4.2 3.6  CL 120* 109  < > 107 107  --  112* 115* 121* 117*  CO2 19* 20*  < > 19* 20*  --  18* 21* 19* 20*  GLUCOSE 127* 126*  < > 89 110*  --  111* 120* 137* 122*  BUN 12 8  < > 7 6  --  8 9 9 8   CREATININE 0.98 0.81  < > 0.91 0.93  --  0.93 0.87 0.80 0.83  CALCIUM 7.0* 7.3*  < > 7.7* 7.8*  --  8.1* 8.4* 8.3* 8.6*  MG 2.0 1.3*  --  1.3*  --  2.0  --  1.7  --   --   < > = values in this interval not displayed. GFR: Estimated Creatinine Clearance: 78.3 mL/min (by C-G formula based on SCr of 0.83 mg/dL). Liver Function Tests:  Recent Labs Lab 07/25/16 0500 07/26/16 0929 07/27/16 0942  AST 28 26 24   ALT 19 22 22   ALKPHOS 71 83 73  BILITOT 0.5 0.9 0.9  PROT 5.6* 6.3* 5.9*  ALBUMIN 2.5* 2.9* 2.7*   No results for input(s): LIPASE, AMYLASE in the last 168 hours. No results for input(s): AMMONIA in the last 168 hours. Coagulation Profile: No results for input(s): INR, PROTIME in the last 168 hours. Cardiac Enzymes: No results for input(s): CKTOTAL, CKMB, CKMBINDEX, TROPONINI in the last 168 hours. BNP (last 3 results) No results for input(s): PROBNP in the last 8760 hours. HbA1C: No results for input(s): HGBA1C in the last 72 hours. CBG:  Recent Labs Lab 07/28/16 0923  GLUCAP 125*   Lipid Profile: No results for input(s): CHOL, HDL, LDLCALC, TRIG, CHOLHDL, LDLDIRECT in the last 72 hours. Thyroid Function Tests: No results for input(s): TSH, T4TOTAL, FREET4, T3FREE, THYROIDAB in the last 72 hours. Anemia Panel: No results for input(s): VITAMINB12, FOLATE, FERRITIN, TIBC, IRON, RETICCTPCT in the last 72  hours. Sepsis Labs: No results for input(s): PROCALCITON, LATICACIDVEN in the last 168 hours.  Recent Results (from the past 240 hour(s))  MRSA PCR Screening     Status: None   Collection Time: 07/22/16 11:35 PM  Result Value Ref Range Status   MRSA by PCR NEGATIVE NEGATIVE Final    Comment:        The GeneXpert MRSA Assay (FDA approved for NASAL specimens only), is one component of a comprehensive MRSA  colonization surveillance program. It is not intended to diagnose MRSA infection nor to guide or monitor treatment for MRSA infections.     Radiology Studies: No results found.  Scheduled Meds: . aspirin EC  81 mg Oral Daily  . chlordiazePOXIDE  5 mg Oral TID  . clopidogrel  75 mg Oral Daily  . cyanocobalamin  1,000 mcg Intramuscular Q30 days  . DULoxetine  60 mg Oral Daily  . feeding supplement (PRO-STAT SUGAR FREE 64)  30 mL Oral TID WC  . folic acid  1 mg Oral Daily  . haloperidol lactate  5 mg Intravenous Once  . heparin subcutaneous  5,000 Units Subcutaneous Q8H  . Influenza vac split quadrivalent PF  0.5 mL Intramuscular Tomorrow-1000  . isosorbide mononitrate  10 mg Oral Daily  . metoprolol succinate  50 mg Oral BID WC  . multivitamin with minerals  1 tablet Oral Daily  . pneumococcal 23 valent vaccine  0.5 mL Intramuscular Tomorrow-1000  . rosuvastatin  40 mg Oral q1800  . thiamine  100 mg Oral Daily  . tiotropium  18 mcg Inhalation Daily   Continuous Infusions: . dexmedetomidine 0.5 mcg/kg/hr (07/31/16 0714)  . dextrose 5 % and 0.45 % NaCl with KCl 20 mEq/L 75 mL/hr at 07/30/16 2100  . LORazepam (ATIVAN) infusion Stopped (07/30/16 0415)   Crittical Care Time Spent 38 mins   LOS: 9 days   Irwin Brakeman, MD [facility] Pager [phone]  If 7PM-7AM, please contact night-coverage [email or web address] Password [facility] 07/31/2016, 7:34 AM

## 2016-07-31 NOTE — Consult Note (Signed)
Referring Provider: Irwin Brakeman, MD Primary Care Physician:  Jana Half Primary Gastroenterologist:  Dr. Laural Golden  Reason for Consultation:    Gastrostomy tube placement.  HPI:   Patient unable to provide any history is obtained from patient's chart and nursing staff. Patient's wife is not at bedside.  Patient is 61 year old Caucasian male with multiple medical problems including chronic ethanol abuse and history of CVA who was admitted to hospitalist service on 07/22/2016 when he presented to emergency room with history of syncope, all in episodes and weakness. He was noted to be hypotensive and bradycardic. He was noted to be profoundly hypokalemic with serum potassium of 2.2. Serum creatinine creatinine was 2.03 and albumin was 2.9. Her ammonia was 24. Hemoglobin was 13.8 and MCV 91.1 platelet count was 280 3K. Patient was admitted to ICU and given IV KCl. He was also hydrated because he was felt to be dehydrated. His troponin levels were mildly elevated. He was also treated for hypomagnesemia. Patient was begun oc CIWA protocol. Unenhanced head CT on admission revealed old infarct. He also had MR brain without contrast on 120 12/20/2016 which revealed large remote left cerebellar infarct as well as small remote left frontal cortical infarct and Lackner infarcts within the left basal ganglia. It also suggested 4 mm diffusion abnormality within the cortical matter of left parietal lobe suspicious acute or subacute ischemic infarct. He also had evidence of mild to moderate chronic microvascular ischemic disease. Despite therapy he went into DTs and had to be placed on Precedex drip. He is not being tapered. Metabolic abnormalities have been corrected and his serum creatinine is down to 0.83. Patient's oral intake has been poor. He was evaluated by Ms. Genene Churn of speech pathology and felt to be moderate risk for aspiration. Since patient's by mouth intake has minimal, nursing  staff tried to place NG tube but was not successful. Patient apparently had bruised and swollen nose and tube could not be advanced. According to nursing staff he has been more alert today and did drink a few sips of liquids. He did not cough.   Past Medical History:  Diagnosis Date  . Anxiety   . BPH (benign prostatic hyperplasia)   . Chronic pain   . Coronary artery disease   . Depression   . Hyperlipemia   . Hypertension   . Neuropathy ([place])   . Pernicious anemia   . Stroke [facility])         Chronic ethanol abuse.  Past Surgical History:  Procedure Laterality Date  . CARDIAC CATHETERIZATION    . CAROTID ENDARTERECTOMY      Prior to Admission medications   Medication Sig Start Date End Date Taking? Authorizing Provider  albuterol (PROVENTIL HFA;VENTOLIN HFA) 108 (90 BASE) MCG/ACT inhaler Inhale 2 puffs into the lungs every 6 (six) hours as needed for wheezing or shortness of breath.   Yes Historical Provider, MD  ALPRAZolam Duanne Moron) 0.5 MG tablet Take 0.5 mg by mouth 2 (two) times daily as needed for anxiety.   Yes Historical Provider, MD  aspirin EC 81 MG tablet Take 81 mg by mouth daily. Reported on 11/16/2015   Yes Historical Provider, MD  baclofen (LIORESAL) 20 MG tablet Take 20 mg by mouth 2 (two) times daily as needed for muscle spasms.   Yes Historical Provider, MD  clopidogrel (PLAVIX) 75 MG tablet Take 75 mg by mouth daily.   Yes Historical Provider, MD  DULoxetine (CYMBALTA) 60 MG capsule Take 60 mg by mouth daily.  Yes Historical Provider, MD  enalapril (VASOTEC) 10 MG tablet Take 10 mg by mouth daily.   Yes Historical Provider, MD  gabapentin (NEURONTIN) 800 MG tablet Take 800 mg by mouth 3 (three) times daily.   Yes Historical Provider, MD  HYDROcodone-acetaminophen (NORCO/VICODIN) 5-325 MG per tablet Take 1 tablet by mouth 3 (three) times daily as needed for moderate pain.   Yes Historical Provider, MD  isosorbide mononitrate (ISMO,MONOKET) 20 MG tablet Take 10 mg by  mouth daily.   Yes Historical Provider, MD  metoprolol succinate (TOPROL-XL) 50 MG 24 hr tablet Take 50 mg by mouth 2 (two) times daily. Take with or immediately following a meal.   Yes Historical Provider, MD  Multiple Vitamin (MULTIVITAMIN WITH MINERALS) TABS tablet Take 1 tablet by mouth daily. 09/12/14  Yes Samuella Cota, MD  rosuvastatin (CRESTOR) 40 MG tablet Take 40 mg by mouth daily.   Yes Historical Provider, MD  thiamine 100 MG tablet Take 1 tablet (100 mg total) by mouth daily. 12/03/15  Yes Orvan Falconer, MD  tiotropium (SPIRIVA) 18 MCG inhalation capsule Place 18 mcg into inhaler and inhale daily.    Yes Historical Provider, MD  vitamin E 400 UNIT capsule Take 400 Units by mouth daily.   Yes Historical Provider, MD  Cyanocobalamin (B-12) 1000 MCG/ML KIT Inject 1,000 mcg as directed every 30 (thirty) days.    Historical Provider, MD    Current Facility-Administered Medications  Medication Dose Route Frequency Provider Last Rate Last Dose  . albuterol (PROVENTIL) (2.5 MG/3ML) 0.083% nebulizer solution 3 mL  3 mL Inhalation Q6H PRN Phillips Grout, MD      . aspirin EC tablet 81 mg  81 mg Oral Daily Phillips Grout, MD   81 mg at 07/29/16 1000  . chlordiazePOXIDE (LIBRIUM) capsule 5 mg  5 mg Oral TID Thurnell Lose, MD   5 mg at 07/29/16 2140  . clopidogrel (PLAVIX) tablet 75 mg  75 mg Oral Daily Phillips Grout, MD   75 mg at 07/29/16 0801  . cyanocobalamin ((VITAMIN B-12)) injection 1,000 mcg  1,000 mcg Intramuscular Q30 days Thurnell Lose, MD   1,000 mcg at 07/23/16 0930  . dexmedetomidine (PRECEDEX) 200 MCG/50ML (4 mcg/mL) infusion  0.2-0.7 mcg/kg/hr Intravenous Continuous Jani Gravel, MD 6.9 mL/hr at 07/31/16 1301 0.4 mcg/kg/hr at 07/31/16 1301  . dextrose 5 % and 0.45 % NaCl with KCl 20 mEq/L infusion   Intravenous Continuous Clanford Marisa Hua, MD 75 mL/hr at 07/31/16 1044    . DULoxetine (CYMBALTA) DR capsule 60 mg  60 mg Oral Daily Phillips Grout, MD   60 mg at 07/29/16 0800  .  feeding supplement (PRO-STAT SUGAR FREE 64) liquid 30 mL  30 mL Oral TID WC Thurnell Lose, MD   30 mL at 07/31/16 1301  . folic acid (FOLVITE) tablet 1 mg  1 mg Oral Daily Phillips Grout, MD   1 mg at 07/29/16 0800  . haloperidol lactate (HALDOL) injection 5 mg  5 mg Intravenous Once Ritta Slot, NP   Stopped at 07/30/16 0541  . heparin injection 5,000 Units  5,000 Units Subcutaneous Q8H Thurnell Lose, MD   5,000 Units at 07/31/16 0539  . Influenza vac split quadrivalent PF (FLUARIX) injection 0.5 mL  0.5 mL Intramuscular Tomorrow-1000 Rachal A Shanon Brow, MD      . isosorbide mononitrate (ISMO,MONOKET) tablet 10 mg  10 mg Oral Daily Thurnell Lose, MD   10 mg at  07/29/16 1000  . loperamide (IMODIUM) capsule 2 mg  2 mg Oral Q2H PRN Leroy Sea, MD      . LORazepam (ATIVAN) 50 mg in dextrose 5 % 50 mL (1 mg/mL) infusion  2-4 mg/hr Intravenous Titrated Jinger Neighbors, NP   Stopped at 07/30/16 [phone]  . metoprolol succinate (TOPROL-XL) 24 hr tablet 50 mg  50 mg Oral BID WC Leroy Sea, MD   50 mg at 07/29/16 1754  . multivitamin with minerals tablet 1 tablet  1 tablet Oral Daily Haydee Monica, MD   1 tablet at 07/29/16 0800  . pneumococcal 23 valent vaccine (PNU-IMMUNE) injection 0.5 mL  0.5 mL Intramuscular Tomorrow-1000 Rachal A Onalee Hua, MD      . rosuvastatin (CRESTOR) tablet 40 mg  40 mg Oral q1800 Leroy Sea, MD   40 mg at 07/29/16 1755  . thiamine (VITAMIN B-1) tablet 100 mg  100 mg Oral Daily Clanford L Johnson, MD   100 mg at 07/29/16 1000  . tiotropium (SPIRIVA) inhalation capsule 18 mcg  18 mcg Inhalation Daily Haydee Monica, MD   18 mcg at 07/31/16 1103    Allergies as of 07/22/2016 - Review Complete 07/22/2016  Allergen Reaction Noted  . Codeine Itching 09/05/2014    Family History  Problem Relation Age of Onset  . Congestive Heart Failure Mother 70  . Heart attack Father 47  . Congestive Heart Failure Sister     Social History   Social History  . Marital  status: Married    Spouse name: N/A  . Number of children: N/A  . Years of education: N/A   Occupational History  . Not on file.   Social History Main Topics  . Smoking status: Current Every Day Smoker    Packs/day: 1.00    Years: 40.00    Types: Cigarettes  . Smokeless tobacco: Never Used     Comment: down to 5 cigarettes/day from 1.5 PPD  . Alcohol use 7.2 oz/week    12 Standard drinks or equivalent per week  . Drug use: No  . Sexual activity: Not on file   Other Topics Concern  . Not on file   Social History Narrative  . No narrative on file    Review of Systems: See HPI, otherwise normal ROS  Physical Exam: Temp:  [97.9 F (36.6 C)-98.8 F (37.1 C)] 98.1 F (36.7 C) (02/04 0739) Pulse Rate:  [62-98] 74 (02/04 1300) Resp:  [7-31] 11 (02/04 1300) BP: (93-162)/(44-85) 121/59 (02/04 1300) SpO2:  [91 %-100 %] 96 % (02/04 1300) Weight:  [147 lb 7.8 oz (66.9 kg)] 147 lb 7.8 oz (66.9 kg) (02/04 0500) Last BM Date: 07/29/16  Patient is drowsy but responds appropriately to questions. He knows he is in a hospital. He remembers is her birth and he tells me he has 3 children. Conjunctiva is pink. Sclerae nonicteric. Pupils are small and equal. Nasal bridge is swollen. Oropharyngeal exam reveals dentition in poor condition. Gums are swollen and retracted. Uvula is midline. No neck masses or thyromegaly noted. Neck is suitable. No spider range Ohmeda or gynecomastia noted. Cardiac exam with regular rhythm normal S1 and S2. No murmur or gallop noted. Abdomen is flat. Bowel sounds are normal. On palpation it is soft and nontender without organomegaly or masses. Genitalia unremarkable with Foley's catheter in place. No peripheral edema or clubbing noted.  Lab Results:  Recent Labs  07/30/16 0442 07/31/16 0427  WBC 8.3 8.9  HGB 10.9*  11.0*  HCT 32.4* 32.9*  PLT 278 283   BMET  Recent Labs  07/29/16 0436 07/30/16 0442 07/31/16 0427  NA 141 147* 144  K 2.9* 4.2  3.6  CL 115* 121* 117*  CO2 21* 19* 20*  GLUCOSE 120* 137* 122*  BUN _0 CREATININE 0.87 0.80 0.83  CALCIUM 8.4* 8.3* 8.6*    Assessment;  Patient is 61 year old Caucasian male with multiple medical problems including history of CVAs. Coronary artery disease chronic ethanol abuse was hospitalized 7 days ago for profound weakness falling episode and syncope. He he was profoundly hypokalemic dehydrated with azotemia as well as hypomagnesemic. Despite therapy he went into delirium tremens and may finally be recovering from it. Since patient's oral intake has been close to nothing NG tube has been was attempted by nursing staff but not possible because of abnormal nasal passages. Patient remains on Precedex dose is being tapered down. I observed patient while he was being fed by nursing staff and he had no difficulty with by you read food and ice tea. Therefore his dysphagia does not appear to be pharyngeal or esophageal problem rather due to neurologic dysfunction. Now that he is more alert he may be able to swallow better and hopefully would not need gastrostomy tube.   Recommendations;  Nursing staff will continue to Asst. and encouraged patient to eat. Will check INR with a.m. Lab. Continue to encourage by mouth intake. Patient will be reevaluated in a.m.   LOS: 9 days   Seraya Jobst  07/31/2016, 1:33 PM

## 2016-07-31 NOTE — Progress Notes (Signed)
More arousable this AM, reported that he was thirsty. Able to drink nectar thick liquids but refused to eat breakfast. Eggs, magic cup, grits, etc offered to pt w/o success. Will offer ensure.

## 2016-07-31 NOTE — Progress Notes (Signed)
Patient consumed less than 10% of his breakfast. He mostly c/o thirst and was able to tolerate drinking nectar thick tea and ensure. Patient encouraged to eat but declines.

## 2016-08-01 LAB — BASIC METABOLIC PANEL
ANION GAP: 4 — AB (ref 5–15)
BUN: 12 mg/dL (ref 6–20)
CHLORIDE: 112 mmol/L — AB (ref 101–111)
CO2: 23 mmol/L (ref 22–32)
Calcium: 8.5 mg/dL — ABNORMAL LOW (ref 8.9–10.3)
Creatinine, Ser: 0.78 mg/dL (ref 0.61–1.24)
GFR calc non Af Amer: >60 mL/min (ref >60)
GLUCOSE: 133 mg/dL — AB (ref 65–99)
POTASSIUM: 3.2 mmol/L — AB (ref 3.5–5.1)
Sodium: 139 mmol/L (ref 135–145)

## 2016-08-01 LAB — CBC
HEMATOCRIT: 32.3 % — AB (ref 39.0–52.0)
HEMOGLOBIN: 11 g/dL — AB (ref 13.0–17.0)
MCH: 32 pg (ref 26.0–34.0)
MCHC: 34.1 g/dL (ref 30.0–36.0)
MCV: 93.9 fL (ref 78.0–100.0)
Platelets: 267 10*3/uL (ref 150–400)
RBC: 3.44 MIL/uL — ABNORMAL LOW (ref 4.22–5.81)
RDW: 15.4 % (ref 11.5–15.5)
WBC: 8.3 10*3/uL (ref 4.0–10.5)

## 2016-08-01 LAB — MAGNESIUM: Magnesium: 1.3 mg/dL — ABNORMAL LOW (ref 1.7–2.4)

## 2016-08-01 MED ORDER — POTASSIUM CHLORIDE CRYS ER 20 MEQ PO TBCR
40.0000 meq | EXTENDED_RELEASE_TABLET | Freq: Two times a day (BID) | ORAL | Status: AC
  Administered 2016-08-01 – 2016-08-02 (×3): 40 meq via ORAL
  Filled 2016-08-01: qty 2
  Filled 2016-08-01: qty 4
  Filled 2016-08-01: qty 2

## 2016-08-01 NOTE — Plan of Care (Signed)
Problem: SLP Dysphagia Goals Goal: Patient will utilize recommended strategies Patient will utilize recommended strategies during swallow to increase swallowing safety with  08/01/16 1419  SLP dysphagia goals  Patient will utilize recommended strategies during swallow to increase swallowing safety with  min assist

## 2016-08-01 NOTE — Progress Notes (Signed)
Patient has no complaints. He ate all of his breakfast and most of his lunch. He has been reevaluated by speech pathologist unclear for clear liquids. Since he does not have oropharyngeal or esophageal dysphagia and oral intake has increased dramatically in the last 24 hours will hold off PEG placement. Will sign off. Call if patient's condition changes.

## 2016-08-01 NOTE — Progress Notes (Signed)
Ativan 30cc wasted in sink; witnessed by Terence Lux, RN.

## 2016-08-01 NOTE — Clinical Social Work Note (Signed)
Clinical Social Work Assessment  Patient Details  Name: George Valdez MRN: [id number] Date of Birth: 22-Mar-1956  Date of referral:  08/01/16               Reason for consult:  Discharge Planning                Permission sought to share information with:    Permission granted to share information::     Name::        Agency::     Relationship::     Contact Information:  Wife, [name], listed on chart  Housing/Transportation Living arrangements for the past 2 months:  Single Family Home Source of Information:  Spouse Patient Interpreter Needed:  None Criminal Activity/Legal Involvement Pertinent to Current Situation/Hospitalization:  No - Comment as needed Significant Relationships:  Spouse Lives with:  Spouse Do you feel safe going back to the place where you live?  Yes Need for family participation in patient care:  No (Coment)  Care giving concerns:  None identified at baseline.  Social Worker assessment / plan:  Mrs. Chappelle advised that at baseline patient has a walker, was experiencing frequent falls and was independent in his ADLs.  She advised that they were interested in patient being placed at [facility] or [facility].   Employment status:  Disabled (Comment on whether or not currently receiving Disability) Insurance information:  Medicare PT Recommendations:  [place] / Referral to community resources:  [place]  Patient/Family's Response to care:  Spouse is agreeable to placement.   Patient/Family's Understanding of and Emotional Response to Diagnosis, Current Treatment, and Prognosis:  Spouse understands patients diagnosis, treatment and prognosis.   Emotional Assessment Appearance:  Appears stated age Attitude/Demeanor/Rapport:  Unable to Assess Affect (typically observed):  Unable to Assess Orientation:  Oriented to Self, Oriented to Place Alcohol / Substance use:  Not Applicable Psych involvement (Current and /or in  the community):  No (Comment)  Discharge Needs  Concerns to be addressed:  Discharge Planning Concerns Readmission within the last 30 days:  No Current discharge risk:   (ETOH abuse) Barriers to Discharge:  No Barriers Identified   Ihor Gully, LCSW 08/01/2016, 11:33 AM

## 2016-08-01 NOTE — Clinical Social Work Note (Signed)
No family was in patient's room.   LCSW attempted to contact patient's wife at home number listed, however it was an incorrect number.    LCSW contacted patient's spouse's cell phone. No answer was received and the voicemail had not been set up.     Kaegan Stigler, Clydene Pugh, LCSW

## 2016-08-01 NOTE — Progress Notes (Signed)
Speech Language Pathology Treatment: Dysphagia  Patient Details Name: George Valdez MRN: [id number] DOB: August 13, 1955 Today's Date: 08/01/2016 Time: [id number] SLP Time Calculation (min) (ACUTE ONLY): 22 min  Assessment / Plan / Recommendation Clinical Impression  Pt seen at bedside with RN and nurse tech present. Both report that when pt is alert, he takes almost 100% of food presented. Pt continues to present with primarily cognitive based dysphagia due to altered alertness and mental status. Pt observed with straw sips thin water, which he appeared to swallow without incident. No overt coughing or throat clearing observed. He continues to require verbal and tactile cues to move tongue, swallow, etc. His nurse tech continues to feel that pureed textures are appropriate for pt given amount of cueing needed at this time (pt also currently not complaining about textures). Mech soft textures deferred for today, however will upgrade to thin/all liquids given safe and efficient intake today without overt s/sx aspiration. SLP will continue to follow in acute setting for upgrades.    HPI HPI: George Binger Campleseis a 61 y.o.malewith medical history significant of previous CVA with left sided weakness, etoh abuse last drink 12 days ago, chronic pain, HTN, CKD 4, CAD , COPD comes in with frequent falls and says he is weaker than normal. In the ER he was found to be severely dehydrated, was severely hypokalemic and ARF. With hydration and electrolyte supplementation his electrolyte abnormalities and ARF have resolved, he is now in full blown DTs and has developed diarrhea for which C. difficile infection is being ruled out. Severe dehydration, ARF with severe hyperkalemia &hypomagnesemia . This was from poor oral intake, Replaced potassium &Mag aggressively, ARF resolved, He developed diarrhea night of 07/26/2016 causing further potassium and magnesium loss, electrolytes will be replaced again. MRI  shows: Punctate 4 mm focus of diffusion abnormality within the cortical gray matter of the left parietal lobe, suspicious for tiny acute/early subacute ischemic infarct. No associated hemorrhage. Large remote left cerebellar infarct, with additional small remote left frontal cortical infarct and remote lacunar infarcts within the left basal ganglia. Chest x-ray: Minimal left base atelectasis or infiltrate. Pt currently afebrile, but did have a fever yesterday. BSE ordered to evaluate safety for po intake.      SLP Plan  Continue with current plan of care     Recommendations  Diet recommendations: Dysphagia 1 (puree);Thin liquid Liquids provided via: Cup;Straw Medication Administration: Whole meds with puree (or water) Supervision: Staff to assist with self feeding;Full supervision/cueing for compensatory strategies Compensations: Minimize environmental distractions;Slow rate;Lingual sweep for clearance of pocketing;Clear throat intermittently Postural Changes and/or Swallow Maneuvers: Seated upright 90 degrees;Upright 30-60 min after meal                Oral Care Recommendations: Oral care BID;Staff/trained caregiver to provide oral care Follow up Recommendations: Skilled Nursing facility Plan: Continue with current plan of care       Thank you,  Genene Churn, [place]                 [place] 08/01/2016, 2:13 PM

## 2016-08-01 NOTE — NC FL2 (Signed)
[place] LEVEL OF CARE SCREENING TOOL     IDENTIFICATION  Patient Name: George Valdez Birthdate: 02-24-1956 Sex: male Admission Date (Current Location): 07/22/2016  [facility] and [place] Number:  [employer] and Address:  [place] [address] [place]      Provider Number: [id number]  Attending Physician Name and Address:  Murlean Iba, MD  Relative Name and Phone Number:       Current Level of Care: Hospital Recommended Level of Care: Nursing Facility Prior Approval Number:    Date Approved/Denied:   PASRR Number: [id number] ([id number])  Discharge Plan: SNF    Current Diagnoses: Patient Active Problem List   Diagnosis Date Noted  . Protein-calorie malnutrition, severe 07/30/2016  . Falls 07/22/2016  . Weakness of left side of body 07/22/2016  . h/o CVA (cerebral vascular accident) ([place]) with left sided weakness 07/22/2016  . Alcohol abuse 07/22/2016  . Hypotension 07/22/2016  . Frequent falls   . Anemia 11/30/2015  . Hypokalemia 11/30/2015  . Acute renal failure (ARF) ([place]) 11/30/2015  . Acute encephalopathy 09/10/2014  . Alcohol withdrawal delirium ([place]) 09/10/2014  . ARF (acute renal failure) ([place]) 09/10/2014  . Sinus bradycardia 09/10/2014  . HTN (hypertension) 09/10/2014  . CAD (coronary artery disease) 09/10/2014  . PVD (peripheral vascular disease) ([place]) 09/10/2014  . Pernicious anemia 09/10/2014    Orientation RESPIRATION BLADDER Height & Weight     Self, Place  Normal Continent Weight: 150 lb 12.7 oz (68.4 kg) Height:  5\' 4"  (162.6 cm)  BEHAVIORAL SYMPTOMS/MOOD NEUROLOGICAL BOWEL NUTRITION STATUS      Continent Diet (DIET DYS 1. Fluid Consistency Nectar Thick)  AMBULATORY STATUS COMMUNICATION OF NEEDS Skin   Extensive Assist Verbally Normal                       Personal Care Assistance Level of Assistance  Bathing, Feeding, Dressing Bathing Assistance: Limited  assistance Feeding assistance: Independent Dressing Assistance: Limited assistance     Functional Limitations Info  Sight, Hearing, Speech Sight Info: Adequate Hearing Info: Adequate Speech Info: Adequate    SPECIAL CARE FACTORS FREQUENCY  PT (By licensed PT), Speech therapy     PT Frequency: 5x/week       Speech Therapy Frequency: 3x/week      Contractures Contractures Info: Not present    Additional Factors Info  Code Status, Allergies, Psychotropic Code Status Info: Full Allergies Info: Codeine Psychotropic Info: Neurontin, Cymbalta, Xanax         Current Medications (08/01/2016):  This is the current hospital active medication list Current Facility-Administered Medications  Medication Dose Route Frequency Provider Last Rate Last Dose  . albuterol (PROVENTIL) (2.5 MG/3ML) 0.083% nebulizer solution 3 mL  3 mL Inhalation Q6H PRN Phillips Grout, MD      . aspirin EC tablet 81 mg  81 mg Oral Daily Phillips Grout, MD   81 mg at 07/29/16 1000  . chlordiazePOXIDE (LIBRIUM) capsule 5 mg  5 mg Oral TID Thurnell Lose, MD   5 mg at 07/31/16 2104  . chlorhexidine (PERIDEX) 0.12 % solution 15 mL  15 mL Mouth Rinse BID Clanford Marisa Hua, MD   15 mL at 08/01/16 0015  . clopidogrel (PLAVIX) tablet 75 mg  75 mg Oral Daily Phillips Grout, MD   75 mg at 07/29/16 0801  . cyanocobalamin ((VITAMIN B-12)) injection 1,000 mcg  1,000 mcg Intramuscular Q30 days Prashant K  Candiss Norse, MD   1,000 mcg at 07/23/16 0930  . dexmedetomidine (PRECEDEX) 200 MCG/50ML (4 mcg/mL) infusion  0.2-0.7 mcg/kg/hr Intravenous Continuous Rhetta Mura Schorr, NP 8.6 mL/hr at 08/01/16 0900 0.5 mcg/kg/hr at 08/01/16 0900  . dextrose 5 % and 0.45 % NaCl with KCl 20 mEq/L infusion   Intravenous Continuous Clanford Marisa Hua, MD 75 mL/hr at 08/01/16 0015    . DULoxetine (CYMBALTA) DR capsule 60 mg  60 mg Oral Daily Phillips Grout, MD   60 mg at 07/29/16 0800  . feeding supplement (PRO-STAT SUGAR FREE 64) liquid 30 mL  30 mL  Oral TID WC Thurnell Lose, MD   30 mL at 08/01/16 0851  . folic acid (FOLVITE) tablet 1 mg  1 mg Oral Daily Phillips Grout, MD   1 mg at 07/29/16 0800  . haloperidol lactate (HALDOL) injection 5 mg  5 mg Intravenous Once Ritta Slot, NP   Stopped at 07/30/16 0541  . heparin injection 5,000 Units  5,000 Units Subcutaneous Q8H Thurnell Lose, MD   5,000 Units at 08/01/16 0500  . Influenza vac split quadrivalent PF (FLUARIX) injection 0.5 mL  0.5 mL Intramuscular Tomorrow-1000 Rachal A Shanon Brow, MD      . isosorbide mononitrate (ISMO,MONOKET) tablet 10 mg  10 mg Oral Daily Thurnell Lose, MD   10 mg at 07/29/16 1000  . loperamide (IMODIUM) capsule 2 mg  2 mg Oral Q2H PRN Thurnell Lose, MD      . LORazepam (ATIVAN) 50 mg in dextrose 5 % 50 mL (1 mg/mL) infusion  2-4 mg/hr Intravenous Titrated Ritta Slot, NP   Stopped at 07/30/16 [phone]  . MEDLINE mouth rinse  15 mL Mouth Rinse q12n4p Clanford L Johnson, MD      . metoprolol succinate (TOPROL-XL) 24 hr tablet 50 mg  50 mg Oral BID WC Thurnell Lose, MD   50 mg at 08/01/16 0851  . multivitamin with minerals tablet 1 tablet  1 tablet Oral Daily Phillips Grout, MD   1 tablet at 07/29/16 0800  . pneumococcal 23 valent vaccine (PNU-IMMUNE) injection 0.5 mL  0.5 mL Intramuscular Tomorrow-1000 Rachal A David, MD      . potassium chloride SA (K-DUR,KLOR-CON) CR tablet 40 mEq  40 mEq Oral BID Clanford L Johnson, MD      . rosuvastatin (CRESTOR) tablet 40 mg  40 mg Oral q1800 Thurnell Lose, MD   40 mg at 07/29/16 1755  . thiamine (VITAMIN B-1) tablet 100 mg  100 mg Oral Daily Clanford L Johnson, MD   100 mg at 07/29/16 1000  . tiotropium (SPIRIVA) inhalation capsule 18 mcg  18 mcg Inhalation Daily Phillips Grout, MD   18 mcg at 08/01/16 [id number]     Discharge Medications: Please see discharge summary for a list of discharge medications.  Relevant Imaging Results:  Relevant Lab Results:   Additional Information SSN [id number],  Clydene Pugh, [place]

## 2016-08-01 NOTE — Progress Notes (Signed)
PROGRESS NOTE    George Valdez  [id number] DOB: 05/03/1956 DOA: 07/22/2016 PCP: Jana Half    Brief Narrative:  61 yo male with chronic etho abuse presents with weakness and frequent falls. On the initial examination he was dehydrated, hypotensive, bradycardic and hypokalemic. During his hospitalization has developed delirium tremens. Currently on benzodiazepines and antipsychotics. Still very confused.  Had to be placed on precedex infusion and is being weaned. Also having problems with poor oral intake but slowly improving.  If no improvement arrangements are being made for PEG consideration.     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 Dehydration with AKI. Renal function has recovered, cr down to 0.93, Continue maintenance IV fluids. Will follow renal panel.  Encourage oral drinking today.  2. Hypokalemia.  Repleted and following.  Give some additional oral potassium today.  Check magnesium.  Follow renal panel.   3. CAD. No chest pain. Continue asa and clopidogrel. Continue rosuvastatin, isosorbide and metoprolol. Systolic blood pressure 90 to 100.   4. History of CVA with left side weakness. Will continue neuro checks per unit protocol.   5. Alcohol withdrawal syndrome with severe delirium tremens.  Pt is having severe withdrawal and delirium tremens but slowly recovering, Pt had to be started on precedex infusion and slowly weaning it.  Unable to insert feeding tube and start tube feeds, hopefully patient will be able to take in more oral nutrition today, I asked GI to eval to see if they could get the feeding tube  in, He may need PEG.   Will continue supportive IV fluids and vitamins including thiamine and folic acid. Continue duloxetine.  Update 2/5: Pt ate well last night and drank 2 colas.  Will try to encourage more oral nutrition today and planning to wean off precedex as tolerated.  Also, pt more mentally alert and appropriate this morning.      6.  Severe protein calorie malnutrition - Pt ate well last night and drank well.  Encourage more oral eating today as he is weaned off precedex.      DVT prophylaxis: Heparin  Code Status: Full  Family Communication: wife Disposition Plan:  Hopefully can transfer out of SDU 2/6.  Planning for SNF placement  Procedures:   Antimicrobials:   Subjective:  Pt ate well last night and a little bit more alert this morning.       Objective: Vitals:   08/01/16 0500 08/01/16 0530 08/01/16 0600 08/01/16 0732  BP: (!) 160/92 (!) 147/85  (!) 147/83  Pulse: 74 70 69 69  Resp: 12 12 13    Temp:    98.1 F (36.7 C)  TempSrc:    Axillary  SpO2: 96% 96% 94% 98%  Weight: 68.4 kg (150 lb 12.7 oz)     Height:        Intake/Output Summary (Last 24 hours) at 08/01/16 0751 Last data filed at 08/01/16 0600  Gross per 24 hour  Intake          2667.06 ml  Output             1850  ml  Net           817.06 ml   Filed Weights   07/30/16 0500 07/31/16 0500 08/01/16 0500  Weight: 69.6 kg (153 lb 7 oz) 66.9 kg (147 lb 7.8 oz) 68.4 kg (150 lb 12.7 oz)    Examination:  General exam: deconditioned chronically ill appearing, confused but able to answer some questions and follow simple commands. E ENT: conjunctival pallor, no icterus, dry oral mucosa, poor dentition.  Respiratory system:  no wheezing or rales. Respiratory effort normal.  Cardiovascular system: S1 & S2 heard.  Gastrointestinal system: Abdomen is nondistended, soft and nontender. No organomegaly or masses felt. Normal bowel sounds heard. Central nervous system: awake, orientated only times person, follows  simple commands. Disorientated, not agitated. Nonfocal.  Extremities: Symmetric 5 x 5 power. Skin: No rashes, lesions or ulcers   Data Reviewed: I have personally reviewed following labs and imaging studies  CBC:  Recent Labs Lab 07/27/16 0431 07/28/16 0454 07/30/16 0442 07/31/16 0427 08/01/16 0430  WBC 9.9 8.9 8.3 8.9 8.3  HGB 12.6* 11.1* 10.9* 11.0* 11.0*  HCT 35.7* 33.3* 32.4* 32.9* 32.3*  MCV 90.2 91.0 93.6 94.5 93.9  PLT 277 255 278 283 [id number]   Basic Metabolic Panel:  Recent Labs Lab 07/26/16 0902  07/27/16 0431  07/28/16 0454 07/28/16 0817 07/29/16 0436 07/30/16 0442 07/31/16 0427 08/01/16 0430  NA 135  < > 135  < >  --  138 141 147* 144 139  K 3.1*  < > 2.5*  < >  --  2.7* 2.9* 4.2 3.6 3.2*  CL 109  < > 107  < >  --  112* 115* 121* 117* 112*  CO2 20*  < > 19*  < >  --  18* 21* 19* 20* 23  GLUCOSE 126*  < > 89  < >  --  111* 120* 137* 122* 133*  BUN 8  < > 7  < >  --  8 9 9 8 12   CREATININE 0.81  < > 0.91  < >  --  0.93 0.87 0.80 0.83 0.78  CALCIUM 7.3*  < > 7.7*  < >  --  8.1* 8.4* 8.3* 8.6* 8.5*  MG 1.3*  --  1.3*  --  2.0  --  1.7  --   --   --   < > = values in this interval not displayed. GFR: Estimated Creatinine Clearance: 81.2 mL/min (by C-G formula based on SCr of 0.78 mg/dL). Liver Function Tests:  Recent Labs Lab 07/26/16 0929 07/27/16 0942  AST 26 24  ALT 22 22  ALKPHOS 83 73  BILITOT 0.9 0.9  PROT 6.3* 5.9*  ALBUMIN 2.9* 2.7*   No results for input(s): LIPASE, AMYLASE in the last 168 hours. No results for input(s): AMMONIA in the last 168 hours. Coagulation Profile: No results for input(s): INR, PROTIME in the last 168 hours. Cardiac Enzymes: No results for input(s): CKTOTAL, CKMB, CKMBINDEX, TROPONINI in the last 168 hours. BNP (last 3 results) No results for input(s): PROBNP in the last 8760 hours. HbA1C: No results for input(s): HGBA1C in the last 72 hours. CBG:  Recent Labs Lab 07/28/16 0923  GLUCAP 125*   Lipid  Profile: No results for input(s): CHOL, HDL, LDLCALC, TRIG, CHOLHDL, LDLDIRECT in the last 72 hours. Thyroid Function Tests: No results for input(s): TSH, T4TOTAL, FREET4, T3FREE, THYROIDAB in the last 72 hours. Anemia Panel: No results for input(s): VITAMINB12, FOLATE, FERRITIN, TIBC, IRON, RETICCTPCT in  the last 72 hours. Sepsis Labs: No results for input(s): PROCALCITON, LATICACIDVEN in the last 168 hours.  Recent Results (from the past 240 hour(s))  MRSA PCR Screening     Status: None   Collection Time: 07/22/16 11:35 PM  Result Value Ref Range Status   MRSA by PCR NEGATIVE NEGATIVE Final    Comment:        The GeneXpert MRSA Assay (FDA approved for NASAL specimens only), is one component of a comprehensive MRSA colonization surveillance program. It is not intended to diagnose MRSA infection nor to guide or monitor treatment for MRSA infections.     Radiology Studies: No results found.  Scheduled Meds: . aspirin EC  81 mg Oral Daily  . chlordiazePOXIDE  5 mg Oral TID  . chlorhexidine  15 mL Mouth Rinse BID  . clopidogrel  75 mg Oral Daily  . cyanocobalamin  1,000 mcg Intramuscular Q30 days  . DULoxetine  60 mg Oral Daily  . feeding supplement (PRO-STAT SUGAR FREE 64)  30 mL Oral TID WC  . folic acid  1 mg Oral Daily  . haloperidol lactate  5 mg Intravenous Once  . heparin subcutaneous  5,000 Units Subcutaneous Q8H  . Influenza vac split quadrivalent PF  0.5 mL Intramuscular Tomorrow-1000  . isosorbide mononitrate  10 mg Oral Daily  . mouth rinse  15 mL Mouth Rinse q12n4p  . metoprolol succinate  50 mg Oral BID WC  . multivitamin with minerals  1 tablet Oral Daily  . pneumococcal 23 valent vaccine  0.5 mL Intramuscular Tomorrow-1000  . potassium chloride  40 mEq Oral BID  . rosuvastatin  40 mg Oral q1800  . thiamine  100 mg Oral Daily  . tiotropium  18 mcg Inhalation Daily   Continuous Infusions: . dexmedetomidine 0.6 mcg/kg/hr (08/01/16 0455)  . dextrose 5 %  and 0.45 % NaCl with KCl 20 mEq/L 75 mL/hr at 08/01/16 0015  . LORazepam (ATIVAN) infusion Stopped (07/30/16 0415)   Crittical Care Time Spent 35 mins   LOS: 10 days   Irwin Brakeman, MD [facility] Pager [phone]  If 7PM-7AM, please contact night-coverage [email or web address] Password [email or web address] 08/01/2016, 7:51 AM

## 2016-08-01 NOTE — Progress Notes (Signed)
08/01/2016  I spoke to the patient's wife and provided her with an update.  She does not seem to be happy with my recommendation for SNF placement but she says that she can't care for patient at home.   I asked her to reconsider allowing patient to go to SNF.  She is in touch with the social workers.    Murvin Natal, MD

## 2016-08-02 LAB — BASIC METABOLIC PANEL
Anion gap: 5 (ref 5–15)
BUN: 12 mg/dL (ref 6–20)
CO2: 22 mmol/L (ref 22–32)
Calcium: 8.3 mg/dL — ABNORMAL LOW (ref 8.9–10.3)
Chloride: 110 mmol/L (ref 101–111)
Creatinine, Ser: 0.8 mg/dL (ref 0.61–1.24)
GFR calc Af Amer: >60 mL/min (ref >60)
GLUCOSE: 98 mg/dL (ref 65–99)
POTASSIUM: 4 mmol/L (ref 3.5–5.1)
Sodium: 137 mmol/L (ref 135–145)

## 2016-08-02 LAB — MAGNESIUM: MAGNESIUM: 1.2 mg/dL — AB (ref 1.7–2.4)

## 2016-08-02 MED ORDER — MAGNESIUM SULFATE 2 GM/50ML IV SOLN
3.0000 g | Freq: Once | INTRAVENOUS | Status: AC
  Administered 2016-08-02: 3 g via INTRAVENOUS
  Filled 2016-08-02: qty 100

## 2016-08-02 MED ORDER — DEXMEDETOMIDINE HCL IN NACL 200 MCG/50ML IV SOLN
INTRAVENOUS | Status: AC
  Filled 2016-08-02: qty 50

## 2016-08-02 MED ORDER — CHLORHEXIDINE GLUCONATE CLOTH 2 % EX PADS
6.0000 | MEDICATED_PAD | Freq: Every day | CUTANEOUS | Status: DC
  Administered 2016-08-02 – 2016-08-04 (×3): 6 via TOPICAL

## 2016-08-02 MED ORDER — ENSURE ENLIVE PO LIQD
237.0000 mL | ORAL | Status: DC
  Administered 2016-08-02 – 2016-08-03 (×2): 237 mL via ORAL

## 2016-08-02 MED ORDER — LORAZEPAM 2 MG/ML IJ SOLN
4.0000 mg | INTRAMUSCULAR | Status: DC | PRN
  Administered 2016-08-02 – 2016-08-03 (×3): 4 mg via INTRAVENOUS
  Filled 2016-08-02 (×3): qty 2

## 2016-08-02 MED ORDER — DEXMEDETOMIDINE HCL IN NACL 200 MCG/50ML IV SOLN
0.4000 ug/kg/h | INTRAVENOUS | Status: DC
  Administered 2016-08-02: 06:00:00 via INTRAVENOUS
  Filled 2016-08-02: qty 50

## 2016-08-02 NOTE — Clinical Social Work Placement (Signed)
   CLINICAL SOCIAL WORK PLACEMENT  NOTE  Date:  08/02/2016  Patient Details  Name: George Valdez MRN: [id number] Date of Birth: Aug 26, 1955  Clinical Social Work is seeking post-discharge placement for this patient at the [place] level of care (*CSW will initial, date and re-position this form in  chart as items are completed):  Yes   Patient/family provided with [place] Work Department's list of facilities offering this level of care within the geographic area requested by the patient (or if unable, by the patient's family).  Yes   Patient/family informed of their freedom to choose among providers that offer the needed level of care, that participate in Medicare, Medicaid or managed care program needed by the patient, have an available bed and are willing to accept the patient.  Yes   Patient/family informed of [place]'s ownership interest in [facility] and [facility], as well as of the fact that they are under no obligation to receive care at these facilities.  PASRR submitted to EDS on       PASRR number received on       Existing PASRR number confirmed on 08/01/16     FL2 transmitted to all facilities in geographic area requested by pt/family on 08/01/16     FL2 transmitted to all facilities within larger geographic area on       Patient informed that his/her managed care company has contracts with or will negotiate with certain facilities, including the following:            Patient/family informed of bed offers received.  Patient chooses bed at       Physician recommends and patient chooses bed at      Patient to be transferred to   on  .  Patient to be transferred to facility by       Patient family notified on   of transfer.  Name of family member notified:        PHYSICIAN       Additional Comment:    _______________________________________________ Ihor Gully, LCSW 08/02/2016, 9:56 AM

## 2016-08-02 NOTE — Progress Notes (Signed)
PROGRESS NOTE    George Valdez  [id number] DOB: 02/16/1956 DOA: 07/22/2016 PCP: Jana Half    Brief Narrative:  61 yo male with chronic etho abuse presents with weakness and frequent falls. On the initial examination he was dehydrated, hypotensive, bradycardic and hypokalemic. During his hospitalization has developed delirium tremens. Currently on benzodiazepines and antipsychotics. Still very confused.  Had to be placed on precedex infusion and is being weaned. Also having problems with poor oral intake but slowly improving.  If no improvement arrangements are being made for PEG consideration.   2/5: Pt starting to eat much better, no need for PEG.  2/6: weaned off precedex and transfer out of ICU.   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 Dehydration with AKI. Renal function has recovered, cr down to 0.93, Continue maintenance IV fluids. Will follow renal panel.  Encourage oral drinking today.  2. Hypokalemia.  Repleted and following.  Given oral potassium.  Magnesium level low today, ordered 3 gm IV mag sulfate.  Follow renal panel.   3. CAD. No chest pain. Continue asa and clopidogrel. Continue rosuvastatin, isosorbide and metoprolol. Systolic blood pressure 90 to 100.   4. History of CVA with left side weakness. Will continue neuro checks per unit protocol.   5. Alcohol withdrawal syndrome with severe delirium tremens.  Pt is having severe withdrawal and delirium tremens but slowly recovering, Pt had to be started on precedex infusion and slowly weaning it.  Unable to insert feeding tube and start tube feeds,  hopefully patient will be able to take in more oral nutrition today, I asked GI to eval to see if they could get the feeding tube in, He may need PEG.   Will continue supportive IV fluids and vitamins including thiamine and folic acid. Continue duloxetine.  Update 2/6: Pt ate well and drank 2 colas.  Will try to encourage more oral nutrition and wean off precedex today.  Also, pt more mentally alert and appropriate this morning.   He is still confused but overall much improved. Planning to transfer out of ICU today.      6.  Severe protein calorie malnutrition - Pt ate well last night and drank well.  Encourage more oral eating today as he is weaned off precedex.     7. Hypomagnesemia - Ordered 3 gm Mag sulfate IV.     DVT prophylaxis: Heparin  Code Status: Full  Family Communication: I had long discussion with wife on phone  Disposition Plan:  Hopefully can transfer out of SDU 2/6.  Planning for SNF placement  Procedures:   Antimicrobials:   Subjective:  Pt ate well yesterday.       Objective: Vitals:   08/02/16 0300 08/02/16 0400 08/02/16 0500 08/02/16 0600  BP: 126/81 (!) 141/87 (!) 99/59 (!) 109/57  Pulse: 76 80 70 69  Resp: 17 13 11 16   Temp:  98.7 F (37.1 C)    TempSrc:  Axillary    SpO2: 98% 97% 93% 94%  Weight:   68.4 kg (150 lb 12.7 oz)   Height:  Intake/Output Summary (Last 24 hours) at 08/02/16 0734 Last data filed at 08/02/16 0244  Gross per 24 hour  Intake          2272.47 ml  Output              750 ml  Net          1522.47 ml   Filed Weights   07/31/16 0500 08/01/16 0500 08/02/16 0500  Weight: 66.9 kg (147 lb 7.8 oz) 68.4 kg (150 lb 12.7 oz) 68.4 kg (150 lb 12.7 oz)    Examination:  General exam: deconditioned chronically ill appearing, confused but able to answer some questions and follow simple commands. E ENT: conjunctival pallor, no icterus, dry oral mucosa, poor dentition.  Respiratory system:  no wheezing or rales. Respiratory effort  normal.  Cardiovascular system: S1 & S2 heard.  Gastrointestinal system: Abdomen is nondistended, soft and nontender. No organomegaly or masses felt. Normal bowel sounds heard. Central nervous system: awake, orientated only times person, follows simple commands. Disorientated, not agitated. Nonfocal.  Extremities: Symmetric 5 x 5 power. Skin: No rashes, lesions or ulcers   Data Reviewed: I have personally reviewed following labs and imaging studies  CBC:  Recent Labs Lab 07/27/16 0431 07/28/16 0454 07/30/16 0442 07/31/16 0427 08/01/16 0430  WBC 9.9 8.9 8.3 8.9 8.3  HGB 12.6* 11.1* 10.9* 11.0* 11.0*  HCT 35.7* 33.3* 32.4* 32.9* 32.3*  MCV 90.2 91.0 93.6 94.5 93.9  PLT 277 255 278 283 [id number]   Basic Metabolic Panel:  Recent Labs Lab 07/27/16 0431  07/28/16 0454  07/29/16 0436 07/30/16 0442 07/31/16 0427 08/01/16 0430 08/02/16 0501  NA 135  < >  --   < > 141 147* 144 139 137  K 2.5*  < >  --   < > 2.9* 4.2 3.6 3.2* 4.0  CL 107  < >  --   < > 115* 121* 117* 112* 110  CO2 19*  < >  --   < > 21* 19* 20* 23 22  GLUCOSE 89  < >  --   < > 120* 137* 122* 133* 98  BUN 7  < >  --   < > 9 9 8 12 12   CREATININE 0.91  < >  --   < > 0.87 0.80 0.83 0.78 0.80  CALCIUM 7.7*  < >  --   < > 8.4* 8.3* 8.6* 8.5* 8.3*  MG 1.3*  --  2.0  --  1.7  --   --  1.3* 1.2*  < > = values in this interval not displayed. GFR: Estimated Creatinine Clearance: 81.2 mL/min (by C-G formula based on SCr of 0.8 mg/dL). Liver Function Tests:  Recent Labs Lab 07/26/16 0929 07/27/16 0942  AST 26 24  ALT 22 22  ALKPHOS 83 73  BILITOT 0.9 0.9  PROT 6.3* 5.9*  ALBUMIN 2.9* 2.7*   No results for input(s): LIPASE, AMYLASE in the last 168 hours. No results for input(s): AMMONIA in the last 168 hours. Coagulation Profile: No results for input(s): INR, PROTIME in the last 168 hours. Cardiac Enzymes: No results for input(s): CKTOTAL, CKMB, CKMBINDEX, TROPONINI in the last 168 hours. BNP (last 3 results) No  results for input(s): PROBNP in the last 8760 hours. HbA1C: No results for input(s): HGBA1C in the last 72 hours. CBG:  Recent Labs Lab 07/28/16 0923  GLUCAP 125*   Lipid Profile: No results for input(s): CHOL, HDL, LDLCALC, TRIG, CHOLHDL, LDLDIRECT in the last  72 hours. Thyroid Function Tests: No results for input(s): TSH, T4TOTAL, FREET4, T3FREE, THYROIDAB in the last 72 hours. Anemia Panel: No results for input(s): VITAMINB12, FOLATE, FERRITIN, TIBC, IRON, RETICCTPCT in the last 72 hours. Sepsis Labs: No results for input(s): PROCALCITON, LATICACIDVEN in the last 168 hours.  No results found for this or any previous visit (from the past 240 hour(s)).  Radiology Studies: No results found.  Scheduled Meds: . aspirin EC  81 mg Oral Daily  . chlordiazePOXIDE  5 mg Oral TID  . chlorhexidine  15 mL Mouth Rinse BID  . clopidogrel  75 mg Oral Daily  . cyanocobalamin  1,000 mcg Intramuscular Q30 days  . DULoxetine  60 mg Oral Daily  . feeding supplement (PRO-STAT SUGAR FREE 64)  30 mL Oral TID WC  . folic acid  1 mg Oral Daily  . haloperidol lactate  5 mg Intravenous Once  . heparin subcutaneous  5,000 Units Subcutaneous Q8H  . Influenza vac split quadrivalent PF  0.5 mL Intramuscular Tomorrow-1000  . isosorbide mononitrate  10 mg Oral Daily  . magnesium sulfate 1 - 4 g bolus IVPB  3 g Intravenous Once  . mouth rinse  15 mL Mouth Rinse q12n4p  . metoprolol succinate  50 mg Oral BID WC  . multivitamin with minerals  1 tablet Oral Daily  . pneumococcal 23 valent vaccine  0.5 mL Intramuscular Tomorrow-1000  . potassium chloride  40 mEq Oral BID  . rosuvastatin  40 mg Oral q1800  . thiamine  100 mg Oral Daily  . tiotropium  18 mcg Inhalation Daily   Continuous Infusions: . dextrose 5 % and 0.45 % NaCl with KCl 20 mEq/L 75 mL/hr at 08/02/16 0244   Crittical Care Time Spent 33 mins   LOS: 11 days   Irwin Brakeman, MD [facility] Pager [phone]  If 7PM-7AM,  please contact night-coverage [email or web address] Password [facility] 08/02/2016, 7:34 AM

## 2016-08-02 NOTE — Progress Notes (Signed)
Nutrition Follow-up  DOCUMENTATION CODES:  Severe malnutrition in context of acute illness/injury   Pt meets criteria for SEVERE MALNUTRITION in the context of Acute Illness  INTERVENTION:  Ensure Enlive q 24 hrs, each supplement provides 350 kcal and 20 grams of protein  Magic cup q 24 hrs, each supplement provides 290 kcal and 9 grams of protein  Continue 30 mL Prostat TID, each supplement provides 100 kcal and 15 grams of protein.  Follow as diet progresses   NUTRITION DIAGNOSIS:  Inadequate oral intake related to  (Delirium, withdrawl)   Resolving  GOAL:  Patient will meet greater than or equal to 90% of their needs  MONITOR:  PO intake, Diet advancement, Labs, Weight trends, I & O's  ASSESSMENT:  61 y/o male PMHx CVA, HTN, CAD, PVD, COPD, Depression, Anxiety, Chronic Pain and etoh abuse. Presented with frequent falls and weakness. Found to have K of 2.2, hypotension, low HR and have AKI.  Has had severe DTs  Interval history since 2/3: Patient's mentation improved shortly after failed FT placement. GI was consulted regarding potential PEG. However, due to pt's improvement, have signed off. No longer on many sedating meds. Precedex tapered off  Patient is much responsive and oriented today. He answers most questions appropriately. He is no longer flailing about.   It appears that since Sunday Evening, the patient has eaten close to 3 full meals. He has refused meals since then due to lack of hunger, but patients wife had reported that he only ever ate 1 meal a day, so it is not expected he would eat much more than this now.   Patient says he is feeling better. Denies n/v/c/d. He says he can "take or leave" the supplements. He remains on a D1 diet. RD offered him a variety of supplements. He only had vague interest to these. Explained RD would older a couple different supplements. Though 1 meal is his baseline, He would still benefit from eating more than 1 meal a day.   Wife  gives UBW of 180 lbs. Per chart review, he has lost 30 lbs in the last 7 months. This equates to ~15% bw  Medications: Librium, vit B12, Prostat, mvi with min thiamin, folate. D5 IVF @ 60-providing 10 kcal/hr  Labs: Hypokalemia resolved, BG 100-140, Mag trending down slightly   Recent Labs Lab 07/29/16 0436  07/31/16 0427 08/01/16 0430 08/02/16 0501  NA 141  < > 144 139 137  K 2.9*  < > 3.6 3.2* 4.0  CL 115*  < > 117* 112* 110  CO2 21*  < > 20* 23 22  BUN 9  < > 8 12 12   CREATININE 0.87  < > 0.83 0.78 0.80  CALCIUM 8.4*  < > 8.6* 8.5* 8.3*  MG 1.7  --   --  1.3* 1.2*  GLUCOSE 120*  < > 122* 133* 98  < > = values in this interval not displayed. Diet Order:  DIET - DYS 1 Room service appropriate? Yes; Fluid consistency: Thin  Skin:  Abrasions/excoriated nose, face, chest, arm   Last BM:  2/4  Height:  Ht Readings from Last 1 Encounters:  07/22/16 5\' 4"  (1.626 m)   Weight:  Wt Readings from Last 1 Encounters:  08/02/16 150 lb 12.7 oz (68.4 kg)   Wt Readings from Last 10 Encounters:  08/02/16 150 lb 12.7 oz (68.4 kg)  12/03/15 179 lb 4.5 oz (81.3 kg)  11/16/15 198 lb (89.8 kg)  01/27/15 215 lb (97.5  kg)  12/30/14 218 lb (98.9 kg)  12/09/14 208 lb (94.3 kg)  10/02/14 218 lb (98.9 kg)  09/22/14 194 lb (88 kg)  09/10/14 193 lb (87.5 kg)  Dosing weight 147.5 lbs (67 kg)  Ideal Body Weight:  59.09 kg  BMI:  Body mass index is 25.88 kg/m.  Estimated Nutritional Needs:  Kcal:  1700-1900 kcals (25-28 kcal/kg bw) Protein:  75-90 g Pro (1.1-1.3 g/kg bw) Fluid:  >2 Liters (30 ml/kg bw)  EDUCATION NEEDS:  No education needs identified at this time  [name] RD, LDN, [facility] Pager: [phone] 08/02/2016 12:21 PM

## 2016-08-03 DIAGNOSIS — E876 Hypokalemia: Secondary | ICD-10-CM

## 2016-08-03 DIAGNOSIS — N179 Acute kidney failure, unspecified: Secondary | ICD-10-CM

## 2016-08-03 DIAGNOSIS — E43 Unspecified severe protein-calorie malnutrition: Secondary | ICD-10-CM

## 2016-08-03 LAB — BASIC METABOLIC PANEL
ANION GAP: 8 (ref 5–15)
BUN: 10 mg/dL (ref 6–20)
CHLORIDE: 104 mmol/L (ref 101–111)
CO2: 24 mmol/L (ref 22–32)
Calcium: 8.6 mg/dL — ABNORMAL LOW (ref 8.9–10.3)
Creatinine, Ser: 0.73 mg/dL (ref 0.61–1.24)
GFR calc non Af Amer: >60 mL/min (ref >60)
Glucose, Bld: 98 mg/dL (ref 65–99)
Potassium: 3.8 mmol/L (ref 3.5–5.1)
Sodium: 136 mmol/L (ref 135–145)

## 2016-08-03 LAB — CBC
HCT: 36.5 % — ABNORMAL LOW (ref 39.0–52.0)
HEMOGLOBIN: 12.2 g/dL — AB (ref 13.0–17.0)
MCH: 31.3 pg (ref 26.0–34.0)
MCHC: 33.4 g/dL (ref 30.0–36.0)
MCV: 93.6 fL (ref 78.0–100.0)
Platelets: 352 10*3/uL (ref 150–400)
RBC: 3.9 MIL/uL — AB (ref 4.22–5.81)
RDW: 14.8 % (ref 11.5–15.5)
WBC: 10.8 10*3/uL — ABNORMAL HIGH (ref 4.0–10.5)

## 2016-08-03 LAB — MAGNESIUM: Magnesium: 2 mg/dL (ref 1.7–2.4)

## 2016-08-03 NOTE — Progress Notes (Signed)
PROGRESS NOTE    George Valdez  [id number] DOB: September 24, 1955 DOA: 07/22/2016 PCP: Jana Half    Brief Narrative:  61 yo male with chronic etho abuse presents with weakness and frequent falls. On the initial examination he was dehydrated, hypotensive, bradycardic and hypokalemic. During his hospitalization has developed delirium tremens. Currently on benzodiazepines and antipsychotics. Still very confused.  Had to be placed on precedex infusion and has been weaned. Also having problems with poor oral intake but slowly improving.  If no improvement arrangements are being made for PEG consideration.   2/5: Pt starting to eat much better, no need for PEG.  2/6: weaned off precedex and transfer out of ICU. 2/7: Plan to discontinue sitter today with the hope of SNF in a.m.  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 Dehydration with AKI. Renal function has recovered, cr down to 0.73,  2. Hypokalemia.  Repleted and following.  Given oral potassium.  Magnesium replaced, 2.0 today.  3. CAD. Stable, No chest pain. Continue asa and clopidogrel. Continue rosuvastatin, isosorbide and metoprolol. Systolic blood pressure 90 to 100.   4. History of CVA with left side weakness. Will continue neuro checks per unit protocol.   5. Alcohol withdrawal syndrome with severe delirium tremens.  Pt is having severe withdrawal and delirium tremens but slowly recovering, Pt had to be started on precedex infusion and has been weaned. Still having some mild episodes of DTs being managed with Ativan on the floor.     6.  Severe protein calorie  malnutrition - Pt ate well last night and drank well.  Encourage more oral eating today as he is weaned off precedex.     7. Hypomagnesemia - Ordered 3 gm Mag sulfate IV.  Replaced appropriately, mag 2.0.   DVT prophylaxis: Heparin  Code Status: Full  Family Communication: Patient only Disposition Plan:  Plan for SNF on 2/8  Procedures:   Antimicrobials:   Subjective:  Pt ate well yesterday.    Still confused, restless   Objective: Vitals:   08/02/16 1800 08/02/16 2300 08/03/16 0600 08/03/16 1117  BP: (!) 144/75 (!) 158/62 (!) 167/64   Pulse: 84 87 83   Resp: 18 15 16    Temp:      TempSrc:      SpO2: 99% 100% 100% 98%  Weight:      Height:        Intake/Output Summary (Last 24 hours) at 08/03/16 1646 Last data filed at 08/03/16 1500  Gross per 24 hour  Intake             2002 ml  Output              200 ml  Net             1802 ml   Filed Weights   07/31/16 0500 08/01/16 0500 08/02/16 0500  Weight: 66.9 kg (147 lb 7.8 oz) 68.4 kg (150 lb 12.7 oz) 68.4 kg (150 lb 12.7 oz)    Examination:  General exam: deconditioned chronically ill appearing, confused but able to answer some questions and follow simple  commands. E ENT: conjunctival pallor, no icterus, dry oral mucosa, poor dentition.  Respiratory system:  no wheezing or rales. Respiratory effort normal.  Cardiovascular system: S1 & S2 heard.  Gastrointestinal system: Abdomen is nondistended, soft and nontender. No organomegaly or masses felt. Normal bowel sounds heard. Central nervous system: awake, orientated only times person, follows simple commands. Disorientated, not agitated. Nonfocal.  Extremities: Symmetric 5 x 5 power. Skin: No rashes, lesions or ulcers   Data Reviewed: I have personally reviewed following labs and imaging studies  CBC:  Recent Labs Lab 07/28/16 0454 07/30/16 0442 07/31/16 0427 08/01/16 0430 08/03/16 0523  WBC 8.9 8.3 8.9 8.3 10.8*  HGB 11.1* 10.9* 11.0* 11.0* 12.2*  HCT 33.3*  32.4* 32.9* 32.3* 36.5*  MCV 91.0 93.6 94.5 93.9 93.6  PLT 255 278 283 267 [id number]   Basic Metabolic Panel:  Recent Labs Lab 07/28/16 0454  07/29/16 0436 07/30/16 0442 07/31/16 0427 08/01/16 0430 08/02/16 0501 08/03/16 0523  NA  --   < > 141 147* 144 139 137 136  K  --   < > 2.9* 4.2 3.6 3.2* 4.0 3.8  CL  --   < > 115* 121* 117* 112* 110 104  CO2  --   < > 21* 19* 20* 23 22 24   GLUCOSE  --   < > 120* 137* 122* 133* 98 98  BUN  --   < > 9 9 8 12 12 10   CREATININE  --   < > 0.87 0.80 0.83 0.78 0.80 0.73  CALCIUM  --   < > 8.4* 8.3* 8.6* 8.5* 8.3* 8.6*  MG 2.0  --  1.7  --   --  1.3* 1.2* 2.0  < > = values in this interval not displayed. GFR: Estimated Creatinine Clearance: 81.2 mL/min (by C-G formula based on SCr of 0.73 mg/dL). Liver Function Tests: No results for input(s): AST, ALT, ALKPHOS, BILITOT, PROT, ALBUMIN in the last 168 hours. No results for input(s): LIPASE, AMYLASE in the last 168 hours. No results for input(s): AMMONIA in the last 168 hours. Coagulation Profile: No results for input(s): INR, PROTIME in the last 168 hours. Cardiac Enzymes: No results for input(s): CKTOTAL, CKMB, CKMBINDEX, TROPONINI in the last 168 hours. BNP (last 3 results) No results for input(s): PROBNP in the last 8760 hours. HbA1C: No results for input(s): HGBA1C in the last 72 hours. CBG:  Recent Labs Lab 07/28/16 0923  GLUCAP 125*   Lipid Profile: No results for input(s): CHOL, HDL, LDLCALC, TRIG, CHOLHDL, LDLDIRECT in the last 72 hours. Thyroid Function Tests: No results for input(s): TSH, T4TOTAL, FREET4, T3FREE, THYROIDAB in the last 72 hours. Anemia Panel: No results for input(s): VITAMINB12, FOLATE, FERRITIN, TIBC, IRON, RETICCTPCT in the last 72 hours. Sepsis Labs: No results for input(s): PROCALCITON, LATICACIDVEN in the last 168 hours.  No results found for this or any previous visit (from the past 240 hour(s)).  Radiology Studies: No results found.  Scheduled  Meds: . aspirin EC  81 mg Oral Daily  . chlordiazePOXIDE  5 mg Oral TID  . chlorhexidine  15 mL Mouth Rinse BID  . Chlorhexidine Gluconate Cloth  6 each Topical Daily  . clopidogrel  75 mg Oral Daily  . cyanocobalamin  1,000 mcg Intramuscular Q30 days  . DULoxetine  60 mg Oral Daily  . feeding supplement (ENSURE ENLIVE)  237 mL Oral Q24H  . feeding supplement (PRO-STAT SUGAR FREE 64)  30 mL Oral TID WC  . folic acid  1 mg Oral Daily  . haloperidol lactate  5 mg Intravenous Once  . heparin subcutaneous  5,000 Units Subcutaneous Q8H  . Influenza vac split quadrivalent PF  0.5 mL Intramuscular Tomorrow-1000  . isosorbide mononitrate  10 mg Oral Daily  . mouth rinse  15 mL Mouth Rinse q12n4p  . metoprolol succinate  50 mg Oral BID WC  . multivitamin with minerals  1 tablet Oral Daily  . pneumococcal 23 valent vaccine  0.5 mL Intramuscular Tomorrow-1000  . rosuvastatin  40 mg Oral q1800  . thiamine  100 mg Oral Daily  . tiotropium  18 mcg Inhalation Daily   Continuous Infusions: . dextrose 5 % and 0.45 % NaCl with KCl 20 mEq/L 1,000 mL (08/02/16 1753)   Time Spent: 25 minutes   LOS: 12 days   Lelon Frohlich, MD [facility] Pager [phone]  If 7PM-7AM, please contact night-coverage [email or web address] Password [facility] 08/03/2016, 4:46 PM

## 2016-08-03 NOTE — Progress Notes (Signed)
Physical Therapy Treatment Patient Details Name: George Valdez MRN: [id number] DOB: 02-01-1956 Today's Date: 08/03/2016    History of Present Illness 61 y.o. male with medical history significant of previous CVA with left sided weakness, etoh abuse last drink 12 days ago, chronic pain, HTN, CAD , COPD comes in with frequent falls and says he is weaker than normal.  Pt reports his left knee "just goes out" and "buckles" and he falls.  He denies any LOC.  He denies any recent illnesses.  No fevers.  No n/v/d.  Pt appears disheveled and has multiple scratches and open wounds to arms he says is due to his falls.  He denies any pain anywhere.  Denies any sob, cough or flu like symptoms.  He reports his left side may have been more weak than the other but overall he says he just felt weak.  Pt found to have k level of 2.2 and low bp and HR.  EMS gave him a dose of atropine in the field.  Pt has been given several liters of ivf and he says he is feeling much better.  He is referred for admission for low bp, low k level, dehydration with AKI.    PT Comments    Pt received attempting to get out of the bed, and he was agreeable to PT tx.  Pt continues to be mildly confused, but improved from previous treatments.  He was able to perform bed mobility at Mod (I) level.  Sit<>stand with RW and min guard, and he ambulated 31ft with RW and Min A due to poor RW navigation and veering to the left.  Pt was able to perform seated and some standing exercises as well, but needs cues for technique.  Continue to recommend SNF.   Follow Up Recommendations  SNF     Equipment Recommendations  None recommended by PT    Recommendations for Other Services       Precautions / Restrictions Precautions Precautions: Fall Precaution Comments: Pt has had multiple falls - he states it is related to his L sided weakness.   Restrictions Weight Bearing Restrictions: No    Mobility  Bed Mobility Overal bed mobility:  Independent Bed Mobility: Supine to Sit;Sit to Supine     Supine to sit: Modified independent (Device/Increase time) Sit to supine: Modified independent (Device/Increase time)      Transfers Overall transfer level: Needs assistance Equipment used: Rolling walker (2 wheeled) Transfers: Sit to/from Stand Sit to Stand: Min assist            Ambulation/Gait Ambulation/Gait assistance: Min assist Ambulation Distance (Feet): 60 Feet Assistive device: Rolling walker (2 wheeled) Gait Pattern/deviations: Shuffle;Decreased step length - right;Decreased step length - left;Trunk flexed;Drifts right/left   Gait velocity interpretation: <1.8 ft/sec, indicative of risk for recurrent falls General Gait Details: Pt has a tendancy to veer left from center and needs PT to constantly adjust RW to prevent running into the wall.     Stairs            Wheelchair Mobility    Modified Rankin (Stroke Patients Only)       Balance Overall balance assessment: History of Falls;Needs assistance Sitting-balance support: Feet supported;Bilateral upper extremity supported Sitting balance-Leahy Scale: Good     Standing balance support: Bilateral upper extremity supported Standing balance-Leahy Scale: Fair                      Cognition   Behavior During Therapy:  WFL for tasks assessed/performed     Orientation Level: Disoriented to;Place;Time                  Exercises General Exercises - Lower Extremity Long Arc Quad: Strengthening;Both;10 reps;Seated Hip Flexion/Marching: Strengthening;Both;10 reps;Seated Other Exercises Other Exercises: 5x sit<>stand with Min guard - pt adding in additional exercises (ie: once he came into standing, he would that stand on 1 foot with UE's supported on RW).     General Comments        Pertinent Vitals/Pain Pain Assessment: No/denies pain    Home Living                      Prior Function            PT Goals  (current goals can now be found in the care plan section) Acute Rehab PT Goals Patient Stated Goal: Pt expressed he wants to go home.  PT Goal Formulation: With patient Time For Goal Achievement: 08/10/16 Potential to Achieve Goals: Fair Progress towards PT goals: Progressing toward goals    Frequency    Min 3X/week      PT Plan Current plan remains appropriate    Co-evaluation             End of Session Equipment Utilized During Treatment: Gait belt Activity Tolerance: Patient tolerated treatment well Patient left: in bed;with call bell/phone within reach;with bed alarm set     Time: [id number] PT Time Calculation (min) (ACUTE ONLY): 20 min  Charges:  $Gait Training: 8-22 mins                    G Codes:     Beth Braxtyn Bojarski, PT, DPT X: [phone]

## 2016-08-04 ENCOUNTER — Inpatient Hospital Stay (HOSPITAL_COMMUNITY): Payer: Medicare Other

## 2016-08-04 DIAGNOSIS — F1010 Alcohol abuse, uncomplicated: Secondary | ICD-10-CM

## 2016-08-04 LAB — CBC
HCT: 35.9 % — ABNORMAL LOW (ref 39.0–52.0)
HEMOGLOBIN: 11.8 g/dL — AB (ref 13.0–17.0)
MCH: 30.9 pg (ref 26.0–34.0)
MCHC: 32.9 g/dL (ref 30.0–36.0)
MCV: 94 fL (ref 78.0–100.0)
PLATELETS: 294 10*3/uL (ref 150–400)
RBC: 3.82 MIL/uL — AB (ref 4.22–5.81)
RDW: 14.8 % (ref 11.5–15.5)
WBC: 16.8 10*3/uL — ABNORMAL HIGH (ref 4.0–10.5)

## 2016-08-04 LAB — BASIC METABOLIC PANEL
ANION GAP: 10 (ref 5–15)
BUN: 11 mg/dL (ref 6–20)
CALCIUM: 8.8 mg/dL — AB (ref 8.9–10.3)
CO2: 23 mmol/L (ref 22–32)
CREATININE: 0.93 mg/dL (ref 0.61–1.24)
Chloride: 105 mmol/L (ref 101–111)
GFR calc Af Amer: >60 mL/min (ref >60)
Glucose, Bld: 99 mg/dL (ref 65–99)
Potassium: 3.5 mmol/L (ref 3.5–5.1)
Sodium: 138 mmol/L (ref 135–145)

## 2016-08-04 MED ORDER — FOLIC ACID 1 MG PO TABS: 1.0000 mg | ORAL_TABLET | Freq: Every day | ORAL | Status: DC

## 2016-08-04 MED ORDER — ALPRAZOLAM 0.5 MG PO TABS: 0.5000 mg | ORAL_TABLET | Freq: Two times a day (BID) | ORAL | 0 refills | Status: DC | PRN

## 2016-08-04 MED ORDER — CHLORDIAZEPOXIDE HCL 5 MG PO CAPS: 5.0000 mg | ORAL_CAPSULE | Freq: Three times a day (TID) | ORAL | 0 refills | Status: DC

## 2016-08-04 NOTE — Progress Notes (Signed)
Patient with orders to be discharge. Report called to [facility], nurse at [facility]. Discharge packet sent with patient. Patient stable. Patient transported via EMS.

## 2016-08-04 NOTE — Clinical Social Work Placement (Signed)
   CLINICAL SOCIAL WORK PLACEMENT  NOTE  Date:  08/04/2016  Patient Details  Name: George Valdez MRN: [id number] Date of Birth: 01-02-1956  Clinical Social Work is seeking post-discharge placement for this patient at the [place] level of care (*CSW will initial, date and re-position this form in  chart as items are completed):  Yes   Patient/family provided with [place] Work Department's list of facilities offering this level of care within the geographic area requested by the patient (or if unable, by the patient's family).  Yes   Patient/family informed of their freedom to choose among providers that offer the needed level of care, that participate in Medicare, Medicaid or managed care program needed by the patient, have an available bed and are willing to accept the patient.  Yes   Patient/family informed of [facility]'s ownership interest in [facility] and [facility], as well as of the fact that they are under no obligation to receive care at these facilities.  PASRR submitted to EDS on       PASRR number received on       Existing PASRR number confirmed on 08/01/16     FL2 transmitted to all facilities in geographic area requested by pt/family on 08/01/16     FL2 transmitted to all facilities within larger geographic area on       Patient informed that his/her managed care company has contracts with or will negotiate with certain facilities, including the following:        Yes   Patient/family informed of bed offers received.  Patient chooses bed at [facility]     Physician recommends and patient chooses bed at      Patient to be transferred to [facility] at [place] on 08/04/16.  Patient to be transferred to facility by [facility]     Patient family notified on 08/04/16 of transfer.  Name of family member notified:  Thaddeous Lorino, spouse     PHYSICIAN       Additional Comment: LCSW notified Debbie at  [facility] that patient was discharging. LCSW arranged transportation with [facility]. LCSW signing off.   _______________________________________________ Ihor Gully, LCSW 08/04/2016, 12:36 PM

## 2016-08-04 NOTE — Care Management Note (Signed)
Case Management Note  Patient Details  Name: George Valdez MRN: [id number] Date of Birth: 1955/08/13  Subjective/Objective:                  Pt is from home. Plan for DC to SNF. CSW has made arrangements for SNF placement.   Action/Plan: Pt discharging to SNF today. No CM needs.   Expected Discharge Date:  08/04/16               Expected Discharge Plan:  [place]  In-House Referral:  Clinical Social Work  Discharge planning Services  CM Consult  Post Acute Care Choice:  NA Choice offered to:  NA  Status of Service:  Completed, signed off  Sherald Barge, RN 08/04/2016, 1:17 PM

## 2016-08-04 NOTE — Progress Notes (Signed)
Modified Barium Swallow Progress Note  Patient Details  Name: George Valdez MRN: [id number] Date of Birth: 04-14-1956  Today's Date: 08/04/2016  Modified Barium Swallow completed.  Full report located under Chart Review in the Imaging Section.  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 Pt with history of previous left cerebellar and basal ganglia CVA in the past with possible new left parietal lobe stroke (MRI this visit) and pt on detox from ETOH protocol. Pt with swallow trigger at the level of the valleculae for puree and regular textures resulting in good pharyngeal clearance, epiglottic deflection, and little to know residuals. Pt demonstrated poor tongue base retraction and epiglottic deflection with liquids (thin and nectar) which resulted in penetration and aspiration of thins during and after the swallow. Pt had one frank episode of aspiration of thins (taken via straw sips) after the swallow from residuals in pyriforms and no spontaneous cough response elicited. Pt cued to cough and he produced weak cough which was ineffective in clearing from trachea. Pt demonstrated impulsivity with self presentations of all liquids, taking large sequential sips despite verbal cues to take "small sips". Pt penetrated thin liquids during and after the swallow which resulted in eventual aspiration (to cords and then back above) in trace amounts which were occasionally sensed, but not completely cleared from underside of epiglottis/vestibule. Pt with moderate vallecular and pyriform residue with thins and nectars after the swallow and pt required moderate verbal cues to continue swallowing to clear. Although no aspiration of nectar was observed, pt is at risk due to residuals in pharynx. Recommend D3/mech soft with NTL via cup sips/ no straws and f/u  with SLP at SNF for continued dysphagia therapy with a focus on improving endurance, alertness, epiglottic deflection, producing strong cough, and swallowing on demand. Pt appeared to become more fatigued as study progressed. Treating SLP can likely upgrade pt to thins clinically once vocal quality improves (pt with wet vocal quality when penetration/aspiration observed and loose, wet cough when cued to cough) and pt closer to baseline. Note that Pt with elevated WBC for the past two days, but no fever and no recent chest x-ray. Results will be faxed directly to [facility].    Swallow Evaluation Recommendations       SLP Diet Recommendations: Dysphagia 3 (Mech soft) solids;Nectar thick liquid;Free water protocol after oral care   Liquid Administration via: Cup;No straw   Medication Administration: Whole meds with liquid   Supervision: Patient able to self feed;Full supervision/cueing for compensatory strategies   Compensations: Minimize environmental distractions;Slow rate;Small sips/bites;Clear throat intermittently   Postural Changes: Remain semi-upright after after feeds/meals (Comment);Seated upright at 90 degrees   Oral Care Recommendations: Oral care BID;Staff/trained caregiver to provide oral care   Other Recommendations: Order thickener from pharmacy;Prohibited food (jello, ice cream, thin soups);Clarify dietary restrictions  Thank you,  Genene Churn, [place]   [place] 08/04/2016,5:42 PM

## 2016-08-04 NOTE — Discharge Summary (Signed)
Physician Discharge Summary  George Valdez HAL:[id number] DOB: [birth year] DOA: 07/22/2016  PCP: Jana Half  Admit date: 07/22/2016 Discharge date: 08/04/2016  Time spent: 45 minutes  Recommendations for Outpatient Follow-up:  -Will be discharged to SNF today.   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 Shanon Brow on 1/26: George Valdez is a 61 y.o. male with medical history significant of previous CVA with left sided weakness, etoh abuse last drink 12 days ago, chronic pain, HTN, CAD , COPD comes in with frequent falls and says he is weaker than normal.  Pt reports his left knee "just goes out" and "buckles" and he falls.  He denies any LOC.  He denies any recent illnesses.  No fevers.  No n/v/d.  Pt appears disheveled and has multiple scratches and open wounds to arms he says is due to his falls.  He denies any pain anywhere.  Denies any sob, cough or flu like symptoms.  He reports his left side may have been more weak than the other but overall he says he just felt weak.  Pt found to have k level of 2.2 and low bp and HR.  EMS gave him a dose of atropine in the field.  Pt has been given several liters of ivf and he says he is feeling much better.  He is referred for admission for low bp, low k level, dehydration with AKI.  He denies any urinary symptoms.  Hospital Course:   1. Dehydration with  AKI. Renal function has recovered, cr down to 0.93 on DC.  2. Hypokalemia.  Replaced  3. CAD. Stable, No chest pain. Continue asa and clopidogrel. Continue rosuvastatin, isosorbide and metoprolol. Systolic blood pressure 90 to 100.   4. History of CVA with left side weakness. No acute changes  5. Alcohol withdrawal syndrome with severe delirium tremens.  Pt is having severe withdrawal and delirium tremens but slowly recovering, Pt had to be started on precedex infusion and has been weaned. Still having some mild episodes of DTs being managed with Ativan and librium on the floor. Spent a restful night.    6.  Severe protein calorie malnutrition - Pt ate well last night and drank well.  Encourage more oral eating today as he is weaned off precedex.     7. Hypomagnesemia -  Replaced appropriately, mag 2.0.  Procedures:  None   Consultations:  None  Discharge Instructions  Discharge Instructions    Diet - low sodium heart healthy    Complete by:  As directed    Increase activity slowly    Complete by:  As directed      Allergies as of 08/04/2016      Reactions   Codeine Itching      Medication List    STOP taking these medications   enalapril 10 MG tablet  Commonly known as:  VASOTEC   HYDROcodone-acetaminophen 5-325 MG tablet Commonly known as:  NORCO/VICODIN     TAKE these medications   albuterol 108 (90 Base) MCG/ACT inhaler Commonly known as:  PROVENTIL HFA;VENTOLIN HFA Inhale 2 puffs into the lungs every 6 (six) hours as needed for wheezing or shortness of breath.   ALPRAZolam 0.5 MG tablet Commonly known as:  XANAX Take 1 tablet (0.5 mg total) by mouth 2 (two) times daily as needed for anxiety.   aspirin EC 81 MG tablet Take 81 mg by mouth daily. Reported on 11/16/2015   B-12 1000 MCG/ML Kit Inject 1,000 mcg as directed every 30 (thirty) days.   baclofen 20 MG tablet Commonly known as:  LIORESAL Take 20 mg by mouth 2 (two) times daily as needed for  muscle spasms.   chlordiazePOXIDE 5 MG capsule Commonly known as:  LIBRIUM Take 1 capsule (5 mg total) by mouth 3 (three) times daily.   clopidogrel 75 MG tablet Commonly known as:  PLAVIX Take 75 mg by mouth daily.   DULoxetine 60 MG capsule Commonly known as:  CYMBALTA Take 60 mg by mouth daily.   folic acid 1 MG tablet Commonly known as:  FOLVITE Take 1 tablet (1 mg total) by mouth daily.   gabapentin 800 MG tablet Commonly known as:  NEURONTIN Take 800 mg by mouth 3 (three) times daily.   isosorbide mononitrate 20 MG tablet Commonly known as:  ISMO,MONOKET Take 10 mg by mouth daily.   metoprolol succinate 50 MG 24 hr tablet Commonly known as:  TOPROL-XL Take 50 mg by mouth 2 (two) times daily. Take with or immediately following a meal.   multivitamin with minerals Tabs tablet Take 1 tablet by mouth daily.   rosuvastatin 40 MG tablet Commonly known as:  CRESTOR Take 40 mg by mouth daily.   thiamine 100 MG tablet Take 1 tablet (100 mg total) by mouth daily.   tiotropium 18 MCG inhalation capsule Commonly known as:  SPIRIVA Place 18 mcg into inhaler and inhale daily.   vitamin E 400 UNIT capsule Take 400 Units by mouth daily.      Allergies  Allergen Reactions  . Codeine Itching      The results of significant diagnostics from this hospitalization (including imaging, microbiology, ancillary and laboratory) are listed below for reference.    Significant Diagnostic Studies: Dg Chest 2 View  Result Date: 07/22/2016 CLINICAL DATA:  Syncopal episode. EXAM: CHEST  2 VIEW COMPARISON:  11/30/2015 FINDINGS: AP and lateral views of the chest show left base atelectasis or infiltrate. Right lung clear. Cardiopericardial silhouette is at upper limits of normal for size. The visualized bony structures of the thorax are intact. Old right clavicle fracture noted. Telemetry leads overlie the chest. IMPRESSION: Minimal left base atelectasis or infiltrate. Electronically  Signed   By: Misty Stanley M.D.   On: 07/22/2016 18:43   Ct Head Wo Contrast  Result Date: 07/22/2016 CLINICAL DATA:  Patient fell at home.  Abrasions to nose. EXAM: CT HEAD WITHOUT CONTRAST CT CERVICAL SPINE WITHOUT CONTRAST TECHNIQUE: Multidetector CT imaging of the head and cervical spine was performed following the standard protocol without intravenous contrast. Multiplanar CT image reconstructions of the cervical spine were also generated. COMPARISON:  Head CT 11/30/2015 cervical spine CT 09/05/2014. FINDINGS: CT HEAD FINDINGS Brain: Stable appearance old left inferior cerebellar infarct. There is no evidence for acute hemorrhage, hydrocephalus, mass lesion, or abnormal extra-axial fluid collection. No definite CT evidence for acute infarction.  Lacunar infarcts noted in the basal ganglia bilaterally . Vascular: Atherosclerotic calcification is visualized in the carotid arteries. No dense MCA sign. Major dural sinuses are unremarkable. Skull: No evidence for fracture. No worrisome lytic or sclerotic lesion. Sinuses/Orbits: The visualized paranasal sinuses and mastoid air cells are clear. Visualized portions of the globes and intraorbital fat are unremarkable. Other: None. CT CERVICAL SPINE FINDINGS Alignment: Straightening of the normal cervical lordosis evident. No subluxation. Skull base and vertebrae: No evidence of fracture from the skullbase through the T1 vertebral body. Soft tissues and spinal canal: No prevertebral fluid or swelling. No visible canal hematoma. Multiple surgical clips noted left neck. Disc levels: Loss of disc height seen at C5-6. Facets are well aligned bilaterally. Upper chest: Negative. Other: None. IMPRESSION: 1. Stable head CT. Old left inferior cerebellar infarct without new or acute intracranial abnormality. 2.  Degenerative changes C5-6.  No cervical spine fracture. 3. Loss of cervical lordosis. This can be related to patient positioning, muscle spasm or soft tissue injury.  Electronically Signed   By: Misty Stanley M.D.   On: 07/22/2016 19:32   Ct Cervical Spine Wo Contrast  Result Date: 07/22/2016 CLINICAL DATA:  Patient fell at home.  Abrasions to nose. EXAM: CT HEAD WITHOUT CONTRAST CT CERVICAL SPINE WITHOUT CONTRAST TECHNIQUE: Multidetector CT imaging of the head and cervical spine was performed following the standard protocol without intravenous contrast. Multiplanar CT image reconstructions of the cervical spine were also generated. COMPARISON:  Head CT 11/30/2015 cervical spine CT 09/05/2014. FINDINGS: CT HEAD FINDINGS Brain: Stable appearance old left inferior cerebellar infarct. There is no evidence for acute hemorrhage, hydrocephalus, mass lesion, or abnormal extra-axial fluid collection. No definite CT evidence for acute infarction. Lacunar infarcts noted in the basal ganglia bilaterally . Vascular: Atherosclerotic calcification is visualized in the carotid arteries. No dense MCA sign. Major dural sinuses are unremarkable. Skull: No evidence for fracture. No worrisome lytic or sclerotic lesion. Sinuses/Orbits: The visualized paranasal sinuses and mastoid air cells are clear. Visualized portions of the globes and intraorbital fat are unremarkable. Other: None. CT CERVICAL SPINE FINDINGS Alignment: Straightening of the normal cervical lordosis evident. No subluxation. Skull base and vertebrae: No evidence of fracture from the skullbase through the T1 vertebral body. Soft tissues and spinal canal: No prevertebral fluid or swelling. No visible canal hematoma. Multiple surgical clips noted left neck. Disc levels: Loss of disc height seen at C5-6. Facets are well aligned bilaterally. Upper chest: Negative. Other: None. IMPRESSION: 1. Stable head CT. Old left inferior cerebellar infarct without new or acute intracranial abnormality. 2.  Degenerative changes C5-6.  No cervical spine fracture. 3. Loss of cervical lordosis. This can be related to patient positioning, muscle spasm  or soft tissue injury. Electronically Signed   By: Misty Stanley M.D.   On: 07/22/2016 19:32   Mr Brain Wo Contrast  Result Date: 07/22/2016 CLINICAL DATA:  Initial evaluation for weakness, multiple falls. History of prior stroke. EXAM: MRI HEAD WITHOUT CONTRAST TECHNIQUE: Multiplanar, multiecho pulse sequences of the brain and surrounding structures were obtained without intravenous contrast. COMPARISON:  Prior CT from earlier the same day. FINDINGS: Brain: Study moderately degraded by motion artifact. Mild diffuse prominence of the CSF containing spaces is compatible with generalized cerebral atrophy. Patchy and confluent T2/FLAIR hyperintensity within the periventricular and deep white matter both cerebral hemispheres most compatible chronic vessel ischemic disease, mild to moderate nature. Chronic microvascular ischemic changes present within the pons as well. There are several superimposed small remote lacunar  infarcts within the left basal ganglia. Small focus of encephalomalacia with gliosis within the cortical gray matter of the anterior left frontal lobe also compatible with a small remote infarct (series 9, image 16). Large remote left cerebellar infarct noted. There is a punctate 4 mm focus of diffusion abnormality within the cortical gray matter of the left parietal lobe (series 3, image 94), suspicious for possible small acute/early subacute cortical infarct. No associated hemorrhage. No other evidence for acute or subacute ischemia. Gray-white matter differentiation otherwise maintained. No evidence for acute or chronic intracranial hemorrhage. No mass lesion, midline shift or mass effect. No hydrocephalus. No extra-axial fluid collection. Major dural sinuses are grossly patent. Pituitary gland not well evaluated on this motion degraded study. Vascular: Left vertebral artery is CED small/hypoplastic, and possibly occluded. Major intracranial vascular flow voids otherwise maintained. Skull and upper  cervical spine: Craniocervical junction within normal limits. Visualized upper cervical spine unremarkable. Bone marrow signal intensity within normal limits. No scalp soft tissue abnormality. Sinuses/Orbits: Globes and orbital soft tissues within normal limits. Mild scattered mucosal thickening within the ethmoidal air cells and maxillary sinuses. No air-fluid level to suggest active sinus infection. No significant mastoid effusion. Inner ear structures grossly normal. IMPRESSION: 1. Punctate 4 mm focus of diffusion abnormality within the cortical gray matter of the left parietal lobe, suspicious for tiny acute/early subacute ischemic infarct. No associated hemorrhage. 2. No other acute intracranial process identified. 3. Large remote left cerebellar infarct, with additional small remote left frontal cortical infarct and remote lacunar infarcts within the left basal ganglia. 4. Mild to moderate chronic microvascular ischemic disease. Electronically Signed   By: Jeannine Boga M.D.   On: 07/22/2016 21:54    Microbiology: No results found for this or any previous visit (from the past 240 hour(s)).   Labs: Basic Metabolic Panel:  Recent Labs Lab 07/29/16 0436  07/31/16 0427 08/01/16 0430 08/02/16 0501 08/03/16 0523 08/04/16 0451  NA 141  < > 144 139 137 136 138  K 2.9*  < > 3.6 3.2* 4.0 3.8 3.5  CL 115*  < > 117* 112* 110 104 105  CO2 21*  < > 20* '23 22 24 23  ' GLUCOSE 120*  < > 122* 133* 98 98 99  BUN 9  < > '8 12 12 10 11  ' CREATININE 0.87  < > 0.83 0.78 0.80 0.73 0.93  CALCIUM 8.4*  < > 8.6* 8.5* 8.3* 8.6* 8.8*  MG 1.7  --   --  1.3* 1.2* 2.0  --   < > = values in this interval not displayed. Liver Function Tests: No results for input(s): AST, ALT, ALKPHOS, BILITOT, PROT, ALBUMIN in the last 168 hours. No results for input(s): LIPASE, AMYLASE in the last 168 hours. No results for input(s): AMMONIA in the last 168 hours. CBC:  Recent Labs Lab 07/30/16 0442 07/31/16 0427  08/01/16 0430 08/03/16 0523 08/04/16 0451  WBC 8.3 8.9 8.3 10.8* 16.8*  HGB 10.9* 11.0* 11.0* 12.2* 11.8*  HCT 32.4* 32.9* 32.3* 36.5* 35.9*  MCV 93.6 94.5 93.9 93.6 94.0  PLT 278 283 267 352 294   Cardiac Enzymes: No results for input(s): CKTOTAL, CKMB, CKMBINDEX, TROPONINI in the last 168 hours. BNP: BNP (last 3 results) No results for input(s): BNP in the last 8760 hours.  ProBNP (last 3 results) No results for input(s): PROBNP in the last 8760 hours.  CBG: No results for input(s): GLUCAP in the last 168 hours.     Signed:  HERNANDEZ  ACOSTA,Johnrobert Foti  [facility] Pager: [phone] 08/04/2016, 10:12 AM

## 2016-08-04 NOTE — Care Management Important Message (Signed)
Important Message  Patient Details  Name: George Valdez MRN: [id number] Date of Birth: 10-09-1955   Medicare Important Message Given:  Yes    Sherald Barge, RN 08/04/2016, 1:09 PM

## 2016-08-04 NOTE — Progress Notes (Signed)
Speech Language Pathology Treatment: Dysphagia  Patient Details Name: COHL WYSINGER MRN: [id number] DOB: 04-12-1956 Today's Date: 08/04/2016 Time: 1210-1230 SLP Time Calculation (min) (ACUTE ONLY): 20 min  Assessment / Plan / Recommendation Clinical Impression  Pt seen at bedside for ongoing diagnostic dysphagia intervention. Pt has reportedly been consuming D1/puree with thin liquids and tolerating without incident per staff. Pt sleeping upon SLP arrival, but he was able to assist with repositioning to upright once awakened. Pt consumed cup and straw sip presentations of thin water. One strong coughing episode observed suspicious for possible penetration/aspiration. Vocal quality intermittently wet and pt cued to cough to clear (minimally effective). Pt is drastically improved in terms of alertness and ability to participate (following directions more consistently), so will pursue MBSS this date prior to D/C to SNF. MD in agreement with plan of care. Will arrange.    HPI HPI: Dairo Hemric Campleseis a 61 y.o.malewith medical history significant of previous CVA with left sided weakness, etoh abuse last drink 12 days ago, chronic pain, HTN, CKD 4, CAD , COPD comes in with frequent falls and says he is weaker than normal. In the ER he was found to be severely dehydrated, was severely hypokalemic and ARF. With hydration and electrolyte supplementation his electrolyte abnormalities and ARF have resolved, he is now in full blown DTs and has developed diarrhea for which C. difficile infection is being ruled out. Severe dehydration, ARF with severe hyperkalemia &hypomagnesemia . This was from poor oral intake, Replaced potassium &Mag aggressively, ARF resolved, He developed diarrhea night of 07/26/2016 causing further potassium and magnesium loss, electrolytes will be replaced again. MRI shows: Punctate 4 mm focus of diffusion abnormality within the cortical gray matter of the left parietal lobe,  suspicious for tiny acute/early subacute ischemic infarct. No associated hemorrhage. Large remote left cerebellar infarct, with additional small remote left frontal cortical infarct and remote lacunar infarcts within the left basal ganglia. Chest x-ray: Minimal left base atelectasis or infiltrate. Pt currently afebrile, but did have a fever yesterday. BSE ordered to evaluate safety for po intake.      SLP Plan  MBS     Recommendations                   Plan: MBS       Thank you,  Genene Churn, [place]                 [place] 08/04/2016, 12:37 PM

## 2016-08-16 ENCOUNTER — Other Ambulatory Visit: Payer: Self-pay | Admitting: *Deleted

## 2016-08-17 DIAGNOSIS — I959 Hypotension, unspecified: Secondary | ICD-10-CM | POA: Diagnosis not present

## 2016-08-17 DIAGNOSIS — R001 Bradycardia, unspecified: Secondary | ICD-10-CM | POA: Diagnosis not present

## 2016-08-17 DIAGNOSIS — E860 Dehydration: Secondary | ICD-10-CM | POA: Diagnosis not present

## 2016-08-17 DIAGNOSIS — E876 Hypokalemia: Secondary | ICD-10-CM | POA: Diagnosis not present

## 2016-08-19 ENCOUNTER — Other Ambulatory Visit: Payer: Self-pay | Admitting: *Deleted

## 2016-08-19 NOTE — Patient Outreach (Signed)
[facility]) Care Management  08/19/2016  George Valdez 11-22-1955 [id number]  I spoke with [name], Admissions Coordinator at [facility] in [place]. [name] confirmed that [name] has been referred to [facility] and anticipated possible discharge by today. I have been unable to reach [name] thus far but will reach out to him/his wife again by phone on Monday.    [facility]  [phone]

## 2016-08-22 ENCOUNTER — Emergency Department (HOSPITAL_COMMUNITY): Payer: Medicare Other

## 2016-08-22 ENCOUNTER — Observation Stay (HOSPITAL_COMMUNITY)
Admission: EM | Admit: 2016-08-22 | Discharge: 2016-08-23 | Disposition: A | Discharge status: Home / Self Care | Payer: Medicare Other | Attending: Internal Medicine | Admitting: Internal Medicine

## 2016-08-22 ENCOUNTER — Encounter (HOSPITAL_COMMUNITY): Payer: Self-pay | Admitting: *Deleted

## 2016-08-22 ENCOUNTER — Other Ambulatory Visit: Payer: Self-pay | Admitting: *Deleted

## 2016-08-22 DIAGNOSIS — R531 Weakness: Secondary | ICD-10-CM | POA: Diagnosis not present

## 2016-08-22 DIAGNOSIS — N390 Urinary tract infection, site not specified: Secondary | ICD-10-CM | POA: Diagnosis not present

## 2016-08-22 DIAGNOSIS — F329 Major depressive disorder, single episode, unspecified: Secondary | ICD-10-CM | POA: Diagnosis not present

## 2016-08-22 DIAGNOSIS — I69354 Hemiplegia and hemiparesis following cerebral infarction affecting left non-dominant side: Secondary | ICD-10-CM | POA: Insufficient documentation

## 2016-08-22 DIAGNOSIS — F419 Anxiety disorder, unspecified: Secondary | ICD-10-CM | POA: Insufficient documentation

## 2016-08-22 DIAGNOSIS — I2510 Atherosclerotic heart disease of native coronary artery without angina pectoris: Secondary | ICD-10-CM | POA: Insufficient documentation

## 2016-08-22 DIAGNOSIS — E876 Hypokalemia: Secondary | ICD-10-CM | POA: Diagnosis not present

## 2016-08-22 DIAGNOSIS — G9340 Encephalopathy, unspecified: Secondary | ICD-10-CM | POA: Diagnosis not present

## 2016-08-22 DIAGNOSIS — R4182 Altered mental status, unspecified: Secondary | ICD-10-CM | POA: Diagnosis present

## 2016-08-22 DIAGNOSIS — Z7902 Long term (current) use of antithrombotics/antiplatelets: Secondary | ICD-10-CM | POA: Insufficient documentation

## 2016-08-22 DIAGNOSIS — E860 Dehydration: Secondary | ICD-10-CM | POA: Insufficient documentation

## 2016-08-22 DIAGNOSIS — F1010 Alcohol abuse, uncomplicated: Secondary | ICD-10-CM | POA: Diagnosis not present

## 2016-08-22 DIAGNOSIS — R410 Disorientation, unspecified: Secondary | ICD-10-CM | POA: Diagnosis not present

## 2016-08-22 DIAGNOSIS — R404 Transient alteration of awareness: Secondary | ICD-10-CM | POA: Diagnosis not present

## 2016-08-22 DIAGNOSIS — Z7982 Long term (current) use of aspirin: Secondary | ICD-10-CM | POA: Insufficient documentation

## 2016-08-22 DIAGNOSIS — R05 Cough: Secondary | ICD-10-CM | POA: Diagnosis not present

## 2016-08-22 DIAGNOSIS — J690 Pneumonitis due to inhalation of food and vomit: Secondary | ICD-10-CM | POA: Diagnosis not present

## 2016-08-22 DIAGNOSIS — I639 Cerebral infarction, unspecified: Secondary | ICD-10-CM | POA: Diagnosis present

## 2016-08-22 DIAGNOSIS — I10 Essential (primary) hypertension: Secondary | ICD-10-CM | POA: Diagnosis not present

## 2016-08-22 DIAGNOSIS — F17210 Nicotine dependence, cigarettes, uncomplicated: Secondary | ICD-10-CM | POA: Diagnosis not present

## 2016-08-22 DIAGNOSIS — Z79899 Other long term (current) drug therapy: Secondary | ICD-10-CM | POA: Diagnosis not present

## 2016-08-22 HISTORY — DX: Alcohol abuse, uncomplicated: F10.10

## 2016-08-22 HISTORY — DX: Repeated falls: R29.6

## 2016-08-22 LAB — DIFFERENTIAL
Basophils Absolute: 0 10*3/uL (ref 0.0–0.1)
Basophils Relative: 0 %
EOS PCT: 0 %
Eosinophils Absolute: 0 10*3/uL (ref 0.0–0.7)
LYMPHS ABS: 0.9 10*3/uL (ref 0.7–4.0)
LYMPHS PCT: 6 %
MONO ABS: 0.6 10*3/uL (ref 0.1–1.0)
Monocytes Relative: 4 %
Neutro Abs: 12 10*3/uL — ABNORMAL HIGH (ref 1.7–7.7)
Neutrophils Relative %: 90 %

## 2016-08-22 LAB — COMPREHENSIVE METABOLIC PANEL
ALT: 14 U/L — ABNORMAL LOW (ref 17–63)
AST: 21 U/L (ref 15–41)
Albumin: 3.4 g/dL — ABNORMAL LOW (ref 3.5–5.0)
Alkaline Phosphatase: 69 U/L (ref 38–126)
Anion gap: 7 (ref 5–15)
BUN: 14 mg/dL (ref 6–20)
CHLORIDE: 105 mmol/L (ref 101–111)
CO2: 31 mmol/L (ref 22–32)
Calcium: 9.7 mg/dL (ref 8.9–10.3)
Creatinine, Ser: 1.04 mg/dL (ref 0.61–1.24)
Glucose, Bld: 148 mg/dL — ABNORMAL HIGH (ref 65–99)
POTASSIUM: 2.9 mmol/L — AB (ref 3.5–5.1)
SODIUM: 143 mmol/L (ref 135–145)
Total Bilirubin: 0.4 mg/dL (ref 0.3–1.2)
Total Protein: 7.7 g/dL (ref 6.5–8.1)

## 2016-08-22 LAB — URINALYSIS, ROUTINE W REFLEX MICROSCOPIC
BILIRUBIN URINE: NEGATIVE
Glucose, UA: NEGATIVE mg/dL
HGB URINE DIPSTICK: NEGATIVE
KETONES UR: NEGATIVE mg/dL
NITRITE: NEGATIVE
Protein, ur: 100 mg/dL — AB
SPECIFIC GRAVITY, URINE: 1.011 (ref 1.005–1.030)
pH: 6 (ref 5.0–8.0)

## 2016-08-22 LAB — RAPID URINE DRUG SCREEN, HOSP PERFORMED
Amphetamines: NOT DETECTED
BARBITURATES: NOT DETECTED
BENZODIAZEPINES: POSITIVE — AB
Cocaine: NOT DETECTED
Opiates: NOT DETECTED
Tetrahydrocannabinol: NOT DETECTED

## 2016-08-22 LAB — I-STAT CHEM 8, ED
BUN: 14 mg/dL (ref 6–20)
CHLORIDE: 104 mmol/L (ref 101–111)
CREATININE: 1.1 mg/dL (ref 0.61–1.24)
Calcium, Ion: 1.26 mmol/L (ref 1.15–1.40)
GLUCOSE: 142 mg/dL — AB (ref 65–99)
HCT: 38 % — ABNORMAL LOW (ref 39.0–52.0)
Hemoglobin: 12.9 g/dL — ABNORMAL LOW (ref 13.0–17.0)
Potassium: 3 mmol/L — ABNORMAL LOW (ref 3.5–5.1)
Sodium: 147 mmol/L — ABNORMAL HIGH (ref 135–145)
TCO2: 30 mmol/L (ref 0–100)

## 2016-08-22 LAB — ETHANOL

## 2016-08-22 LAB — CBC
HEMATOCRIT: 39.6 % (ref 39.0–52.0)
Hemoglobin: 13.1 g/dL (ref 13.0–17.0)
MCH: 31 pg (ref 26.0–34.0)
MCHC: 33.1 g/dL (ref 30.0–36.0)
MCV: 93.8 fL (ref 78.0–100.0)
Platelets: 338 10*3/uL (ref 150–400)
RBC: 4.22 MIL/uL (ref 4.22–5.81)
RDW: 15.1 % (ref 11.5–15.5)
WBC: 13.5 10*3/uL — ABNORMAL HIGH (ref 4.0–10.5)

## 2016-08-22 LAB — MAGNESIUM: MAGNESIUM: 1.9 mg/dL (ref 1.7–2.4)

## 2016-08-22 LAB — TROPONIN I

## 2016-08-22 LAB — TSH: TSH: 0.369 u[IU]/mL (ref 0.350–4.500)

## 2016-08-22 LAB — ACETAMINOPHEN LEVEL

## 2016-08-22 LAB — SALICYLATE LEVEL: Salicylate Lvl: <7 mg/dL (ref 2.8–30.0)

## 2016-08-22 MED ORDER — POTASSIUM CHLORIDE CRYS ER 20 MEQ PO TBCR
40.0000 meq | EXTENDED_RELEASE_TABLET | Freq: Once | ORAL | Status: AC
  Administered 2016-08-22: 40 meq via ORAL
  Filled 2016-08-22: qty 2

## 2016-08-22 MED ORDER — DULOXETINE HCL 60 MG PO CPEP
60.0000 mg | ORAL_CAPSULE | Freq: Every day | ORAL | Status: DC
  Administered 2016-08-23: 60 mg via ORAL
  Filled 2016-08-22: qty 1

## 2016-08-22 MED ORDER — POTASSIUM CHLORIDE IN NACL 40-0.9 MEQ/L-% IV SOLN
INTRAVENOUS | Status: DC
  Administered 2016-08-22 – 2016-08-23 (×2): 100 mL/h via INTRAVENOUS

## 2016-08-22 MED ORDER — VITAMIN B-1 100 MG PO TABS
100.0000 mg | ORAL_TABLET | Freq: Every day | ORAL | Status: DC
  Administered 2016-08-23: 100 mg via ORAL
  Filled 2016-08-22: qty 1

## 2016-08-22 MED ORDER — ASPIRIN EC 81 MG PO TBEC
81.0000 mg | DELAYED_RELEASE_TABLET | Freq: Every day | ORAL | Status: DC
  Filled 2016-08-22: qty 1

## 2016-08-22 MED ORDER — METOPROLOL SUCCINATE ER 50 MG PO TB24
50.0000 mg | ORAL_TABLET | Freq: Two times a day (BID) | ORAL | Status: DC
  Administered 2016-08-22 – 2016-08-23 (×2): 50 mg via ORAL
  Filled 2016-08-22 (×2): qty 1

## 2016-08-22 MED ORDER — CLOPIDOGREL BISULFATE 75 MG PO TABS
75.0000 mg | ORAL_TABLET | Freq: Every day | ORAL | Status: DC
  Administered 2016-08-23: 75 mg via ORAL
  Filled 2016-08-22: qty 1

## 2016-08-22 MED ORDER — ACETAMINOPHEN 325 MG PO TABS: 650.0000 mg | ORAL_TABLET | Freq: Four times a day (QID) | ORAL | Status: DC | PRN

## 2016-08-22 MED ORDER — FOLIC ACID 1 MG PO TABS
1.0000 mg | ORAL_TABLET | Freq: Every day | ORAL | Status: DC
  Administered 2016-08-23: 1 mg via ORAL
  Filled 2016-08-22: qty 1

## 2016-08-22 MED ORDER — ALBUTEROL SULFATE (2.5 MG/3ML) 0.083% IN NEBU: 2.5000 mg | INHALATION_SOLUTION | RESPIRATORY_TRACT | Status: DC | PRN

## 2016-08-22 MED ORDER — ROSUVASTATIN CALCIUM 20 MG PO TABS: 40.0000 mg | ORAL_TABLET | Freq: Every day | ORAL | Status: DC

## 2016-08-22 MED ORDER — TIOTROPIUM BROMIDE MONOHYDRATE 18 MCG IN CAPS
ORAL_CAPSULE | RESPIRATORY_TRACT | Status: AC
  Filled 2016-08-22: qty 5

## 2016-08-22 MED ORDER — ONDANSETRON HCL 4 MG/2ML IJ SOLN: 4.0000 mg | Freq: Four times a day (QID) | INTRAMUSCULAR | Status: DC | PRN

## 2016-08-22 MED ORDER — ISOSORBIDE MONONITRATE 20 MG PO TABS
10.0000 mg | ORAL_TABLET | Freq: Every day | ORAL | Status: DC
  Administered 2016-08-23: 10 mg via ORAL
  Filled 2016-08-22: qty 1

## 2016-08-22 MED ORDER — ENOXAPARIN SODIUM 40 MG/0.4ML ~~LOC~~ SOLN
40.0000 mg | SUBCUTANEOUS | Status: DC
  Administered 2016-08-22: 40 mg via SUBCUTANEOUS
  Filled 2016-08-22: qty 0.4

## 2016-08-22 MED ORDER — POTASSIUM CHLORIDE CRYS ER 20 MEQ PO TBCR
40.0000 meq | EXTENDED_RELEASE_TABLET | ORAL | Status: AC
  Administered 2016-08-22 (×2): 40 meq via ORAL
  Filled 2016-08-22 (×2): qty 2

## 2016-08-22 MED ORDER — SODIUM CHLORIDE 0.9 % IV SOLN
INTRAVENOUS | Status: AC
  Filled 2016-08-22 (×2): qty 3

## 2016-08-22 MED ORDER — ONDANSETRON HCL 4 MG PO TABS: 4.0000 mg | ORAL_TABLET | Freq: Four times a day (QID) | ORAL | Status: DC | PRN

## 2016-08-22 MED ORDER — POTASSIUM CHLORIDE 10 MEQ/100ML IV SOLN
10.0000 meq | Freq: Once | INTRAVENOUS | Status: AC
  Administered 2016-08-22: 10 meq via INTRAVENOUS
  Filled 2016-08-22: qty 100

## 2016-08-22 MED ORDER — GABAPENTIN 400 MG PO CAPS
800.0000 mg | ORAL_CAPSULE | Freq: Three times a day (TID) | ORAL | Status: DC
  Administered 2016-08-22 – 2016-08-23 (×4): 800 mg via ORAL
  Filled 2016-08-22 (×4): qty 2

## 2016-08-22 MED ORDER — ACETAMINOPHEN 650 MG RE SUPP: 650.0000 mg | Freq: Four times a day (QID) | RECTAL | Status: DC | PRN

## 2016-08-22 MED ORDER — TIOTROPIUM BROMIDE MONOHYDRATE 18 MCG IN CAPS
18.0000 ug | ORAL_CAPSULE | Freq: Every day | RESPIRATORY_TRACT | Status: DC
  Administered 2016-08-23: 18 ug via RESPIRATORY_TRACT
  Filled 2016-08-22: qty 5

## 2016-08-22 MED ORDER — SODIUM CHLORIDE 0.9 % IV SOLN
3.0000 g | Freq: Four times a day (QID) | INTRAVENOUS | Status: DC
  Administered 2016-08-23 (×3): 3 g via INTRAVENOUS
  Filled 2016-08-22 (×8): qty 3

## 2016-08-22 MED ORDER — AMPICILLIN-SULBACTAM SODIUM 3 (2-1) G IJ SOLR
3.0000 g | Freq: Once | INTRAMUSCULAR | Status: AC
  Administered 2016-08-22: 3 g via INTRAVENOUS
  Filled 2016-08-22: qty 3

## 2016-08-22 NOTE — ED Triage Notes (Signed)
Per family member, pt woke up at 48 today wanting to go to work. Per family, patient hasn't worked in years. Pt is alert. He is oriented to self. Disoriented to time.

## 2016-08-22 NOTE — Patient Outreach (Signed)
[facility]) Care Management  08/22/2016  George Valdez 06-Oct-1955 [id number]  I reached out to George Valdez at home again today. I see that he presented to the ED early this morning with confusion and altered mental status and is being kept under observation.   I am familiar with George Valdez from our work with him in 2016. George Valdez was referred to [facility] from his provider's office after a hospitalization for encephalopathy. He was followed by social work, nursing, and pharmacy at that time for community resources/psychosocial needs/ medication management and adherence, hypertension management, and fall risk reduction.   When George Valdez was discharged from care management services, he was taking all his medications as prescribed and had taken up his favorite hobby - cooking. He began attending scheduled provider appointments and self monitoring his blood pressure.   Plan: I will reach out to George Valdez again tomorrow and will plan a face to face visit in his home as soon as he will allow me to schedule.    [facility]  [phone]

## 2016-08-22 NOTE — H&P (Signed)
History and Physical    George Valdez [id number] DOB: 1956/03/16 DOA: 08/22/2016  PCP: Jana Half  Patient coming from: home  Chief Complaint: confusion  HPI: George Valdez is a 61 y.o. male with medical history significant of alcohol abuse and previous stroke, was recently discharged from hospital on 2/8 after being treated for renal failure and alcohol withdrawal. He was discharged to a skilled nursing facility. It is unclear when the patient was discharged home. Apparently, patient woke up at 2:00 this morning "wanted to go to work". The patient had not worked in years. He is unable to provide any significant history at this time and no family is present. He denies any complaints at this time. He is somewhat disoriented, does not know that he is in the hospital and does not know the year.  ED Course: Patient was evaluated in the emergency room where CT scan of the head was found to unremarkable. Chest x-ray indicated a possible aspiration pneumonia. Urinalysis indicated possible UTI. Lab work showed that he was hypokalemic at 2.9. Remainder of lab work was relatively unremarkable. Vitals were otherwise stable. Due to his persistent confusion and other findings, he is referred for admission.  Review of Systems: As per HPI otherwise 10 point review of systems negative.    Past Medical History:  Diagnosis Date  . Alcohol abuse   . Anxiety   . BPH (benign prostatic hyperplasia)   . Chronic pain   . Coronary artery disease   . Depression   . Frequent falls   . Hyperlipemia   . Hypertension   . Neuropathy ([place])   . Pernicious anemia   . Stroke [facility])    left sided weakness    Past Surgical History:  Procedure Laterality Date  . CARDIAC CATHETERIZATION    . CAROTID ENDARTERECTOMY       reports that he has been smoking Cigarettes.  He has a 40.00 pack-year smoking history. He has never used smokeless tobacco. He reports that he drinks about 7.2 oz of  alcohol per week . He reports that he does not use drugs.  Allergies  Allergen Reactions  . Codeine Itching    Family History  Problem Relation Age of Onset  . Congestive Heart Failure Mother 70  . Heart attack Father 41  . Congestive Heart Failure Sister     Prior to Admission medications   Medication Sig Start Date End Date Taking? Authorizing Provider  albuterol (PROVENTIL HFA;VENTOLIN HFA) 108 (90 BASE) MCG/ACT inhaler Inhale 2 puffs into the lungs every 6 (six) hours as needed for wheezing or shortness of breath.   Yes Historical Provider, MD  aspirin EC 81 MG tablet Take 81 mg by mouth daily. Reported on 11/16/2015   Yes Historical Provider, MD  baclofen (LIORESAL) 20 MG tablet Take 20 mg by mouth 2 (two) times daily as needed for muscle spasms.   Yes Historical Provider, MD  chlordiazePOXIDE (LIBRIUM) 5 MG capsule Take 1 capsule (5 mg total) by mouth 3 (three) times daily. 08/04/16  Yes Erline Hau, MD  clopidogrel (PLAVIX) 75 MG tablet Take 75 mg by mouth daily.   Yes Historical Provider, MD  cyanocobalamin (,VITAMIN B-12,) 1000 MCG/ML injection Inject 1,000 mcg into the muscle every 30 (thirty) days.   Yes Historical Provider, MD  DULoxetine (CYMBALTA) 60 MG capsule Take 60 mg by mouth daily.   Yes Historical Provider, MD  folic acid (FOLVITE) 1 MG tablet Take 1 tablet (1 mg total) by  mouth daily. 08/04/16  Yes Erline Hau, MD  gabapentin (NEURONTIN) 800 MG tablet Take 800 mg by mouth 3 (three) times daily.   Yes Historical Provider, MD  isosorbide mononitrate (ISMO,MONOKET) 20 MG tablet Take 10 mg by mouth daily.   Yes Historical Provider, MD  metoprolol succinate (TOPROL-XL) 50 MG 24 hr tablet Take 50 mg by mouth 2 (two) times daily. Take with or immediately following a meal.   Yes Historical Provider, MD  Multiple Vitamin (MULTIVITAMIN WITH MINERALS) TABS tablet Take 1 tablet by mouth daily. 09/12/14  Yes Samuella Cota, MD  rosuvastatin (CRESTOR) 40  MG tablet Take 40 mg by mouth daily.   Yes Historical Provider, MD  thiamine 100 MG tablet Take 1 tablet (100 mg total) by mouth daily. 12/03/15  Yes Orvan Falconer, MD  tiotropium (SPIRIVA) 18 MCG inhalation capsule Place 18 mcg into inhaler and inhale daily.    Yes Historical Provider, MD  vitamin E 400 UNIT capsule Take 400 Units by mouth daily.   Yes Historical Provider, MD    Physical Exam: Vitals:   08/22/16 1125 08/22/16 1130 08/22/16 1300 08/22/16 1551  BP:  151/85 (!) 163/85 (!) 131/44  Pulse: 92  91 84  Resp: 17 16 16 18   Temp:   98.3 F (36.8 C) 99.1 F (37.3 C)  TempSrc:   Axillary Oral  SpO2: 99%  98% 95%  Weight:      Height:          Constitutional: NAD, calm, comfortable Vitals:   08/22/16 1125 08/22/16 1130 08/22/16 1300 08/22/16 1551  BP:  151/85 (!) 163/85 (!) 131/44  Pulse: 92  91 84  Resp: 17 16 16 18   Temp:   98.3 F (36.8 C) 99.1 F (37.3 C)  TempSrc:   Axillary Oral  SpO2: 99%  98% 95%  Weight:      Height:       Eyes: PERRL, lids and conjunctivae normal ENMT: Mucous membranes are moist. Posterior pharynx clear of any exudate or lesions.Normal dentition.  Neck: normal, supple, no masses, no thyromegaly Respiratory: clear to auscultation bilaterally, no wheezing, no crackles. Normal respiratory effort. No accessory muscle use.  Cardiovascular: Regular rate and rhythm, no murmurs / rubs / gallops. No extremity edema. 2+ pedal pulses. No carotid bruits.  Abdomen: no tenderness, no masses palpated. No hepatosplenomegaly. Bowel sounds positive.  Musculoskeletal: no clubbing / cyanosis. No joint deformity upper and lower extremities. Good ROM, no contractures. Normal muscle tone.  Skin: no rashes, lesions, ulcers. No induration Neurologic: CN 2-12 grossly intact. Sensation intact, DTR normal. Strength 5/5 in all 4.  Psychiatric: confused   Labs on Admission: I have personally reviewed following labs and imaging studies  CBC:  Recent Labs Lab  08/22/16 0750 08/22/16 0757  WBC 13.5*  --   NEUTROABS  --  12.0*  HGB 13.1 12.9*  HCT 39.6 38.0*  MCV 93.8  --   PLT 338  --    Basic Metabolic Panel:  Recent Labs Lab 08/22/16 0750 08/22/16 0757  NA 143 147*  K 2.9* 3.0*  CL 105 104  CO2 31  --   GLUCOSE 148* 142*  BUN 14 14  CREATININE 1.04 1.10  CALCIUM 9.7  --   MG  --  1.9   GFR: Estimated Creatinine Clearance: 59.1 mL/min (by C-G formula based on SCr of 1.1 mg/dL). Liver Function Tests:  Recent Labs Lab 08/22/16 0750  AST 21  ALT 14*  ALKPHOS 69  BILITOT 0.4  PROT 7.7  ALBUMIN 3.4*   No results for input(s): LIPASE, AMYLASE in the last 168 hours. No results for input(s): AMMONIA in the last 168 hours. Coagulation Profile: No results for input(s): INR, PROTIME in the last 168 hours. Cardiac Enzymes:  Recent Labs Lab 08/22/16 0757  TROPONINI <0.03   BNP (last 3 results) No results for input(s): PROBNP in the last 8760 hours. HbA1C: No results for input(s): HGBA1C in the last 72 hours. CBG: No results for input(s): GLUCAP in the last 168 hours. Lipid Profile: No results for input(s): CHOL, HDL, LDLCALC, TRIG, CHOLHDL, LDLDIRECT in the last 72 hours. Thyroid Function Tests: No results for input(s): TSH, T4TOTAL, FREET4, T3FREE, THYROIDAB in the last 72 hours. Anemia Panel: No results for input(s): VITAMINB12, FOLATE, FERRITIN, TIBC, IRON, RETICCTPCT in the last 72 hours. Urine analysis:    Component Value Date/Time   COLORURINE YELLOW 08/22/2016 0937   APPEARANCEUR HAZY (A) 08/22/2016 0937   LABSPEC 1.011 08/22/2016 0937   PHURINE 6.0 08/22/2016 0937   GLUCOSEU NEGATIVE 08/22/2016 0937   HGBUR NEGATIVE 08/22/2016 0937   BILIRUBINUR NEGATIVE 08/22/2016 0937   KETONESUR NEGATIVE 08/22/2016 0937   PROTEINUR 100 (A) 08/22/2016 0937   UROBILINOGEN 0.2 09/10/2014 0908   NITRITE NEGATIVE 08/22/2016 0937   LEUKOCYTESUR MODERATE (A) 08/22/2016 0937    Radiological Exams on Admission: Dg Chest  1 View  Result Date: 08/22/2016 CLINICAL DATA:  Altered mental status this morning. Cough since last night. History of coronary artery disease, previous CVA, long-term smoker, alcohol abuse with encephalopathy and withdrawal episodes. EXAM: CHEST 1 VIEW COMPARISON:  Chest x-ray of July 22, 2016 FINDINGS: The lungs are adequately inflated. The interstitial markings are coarse though stable. There is subtle increased density in the right infrahilar region however that is slightly more conspicuous than in the past. The heart is normal in size. The pulmonary vascularity is not engorged. There is calcification in the wall of the aortic arch. There is old deformity of the midshaft of the right clavicle. IMPRESSION: Chronic interstitial prominence likely related to the patient's smoking history. However, there is superimposed slightly increased interstitial density at the right lung base which may reflect early aspiration pneumonia in the appropriate clinical setting. There is no CHF or alveolar pneumonia. Thoracic aortic atherosclerosis. Electronically Signed   By: David  Martinique M.D.   On: 08/22/2016 09:19   Ct Head Wo Contrast  Result Date: 08/22/2016 CLINICAL DATA:  Acute disorientation beginning last night. EXAM: CT HEAD WITHOUT CONTRAST TECHNIQUE: Contiguous axial images were obtained from the base of the skull through the vertex without intravenous contrast. COMPARISON:  MRI 07/22/2016 and CT 07/22/2016 FINDINGS: Brain: Old left cerebellar infarction as seen previously. Mild to moderate chronic small-vessel ischemic changes of the hemispheric white matter with old lacunar infarctions in the left basal ganglia. No evidence of acute infarction, mass lesion, hemorrhage, hydrocephalus or extra-axial collection. Vascular: There is atherosclerotic calcification of the major vessels at the base of the brain. Skull: Negative Sinuses/Orbits: Clear/normal Other: None significant IMPRESSION: No acute finding by CT.   Old ischemic changes as outlined above. Electronically Signed   By: Nelson Chimes M.D.   On: 08/22/2016 09:06    EKG: Independently reviewed. Sinus rhythm without acute changes.  Assessment/Plan Active Problems:   Acute encephalopathy   HTN (hypertension)   Hypokalemia   h/o CVA (cerebral vascular accident) ([place]) with left sided weakness   Alcohol abuse   Altered mental status   Aspiration pneumonia ([place])  UTI (urinary tract infection)    1. Acute encephalopathy. Etiology is unclear. Possible UTI versus pneumonia may be contributing. Check TSH and ammonia. Start on gentle hydration. If unchanged by tomorrow, can consider further imaging such as MRI.  2. Hypokalemia. History of the same in the past. Start on potassium supplements and check magnesium.  3. Possible UTI. Started on antibiotics. Follow-up urine culture.  4. Aspiration pneumonia. Noted on chest x-ray. He does not really have any symptoms. Started on Unasyn. Continue to follow clinically.  5. History of alcohol abuse. No signs of withdrawal at present. Continue to monitor.  6. Social. Patient's friend who briefly at his bedside in the emergency room said that his family brought him home from skilled nursing facility and were not providing adequate care for him. We'll asked social work and case management to assist. Physical therapy consult.   DVT prophylaxis: lovenox Code Status: full code Family Communication: no family present Disposition Plan: discharge home when improved Consults called:  Admission status: observation, Dollene Cleveland MD [facility] Pager [phone]  If 7PM-7AM, please contact night-coverage [email or web address] Password [facility]  08/22/2016, 6:23 PM

## 2016-08-22 NOTE — ED Provider Notes (Signed)
[place] DEPT Provider Note   CSN: [id number] Arrival date & time: 08/22/16  0745     History   Chief Complaint Chief Complaint  Patient presents with  . Altered Mental Status    HPI George Valdez is a 61 y.o. male.  The history is provided by the patient and the EMS personnel. The history is limited by the condition of the patient (AMS).  Altered Mental Status    Pt was seen at 0755. Per EMS and family report: Pt's family called EMS because pt woke up at 0200 this morning "wanting to go to work." Pt apparently "hasn't worked in years." Pt states he "feels fine" and does not know why he was sent to the ED today. Denies CP/SOB, no abd pain, no N/V/D, no new focal motor weakness from baseline.    Past Medical History:  Diagnosis Date  . Alcohol abuse   . Anxiety   . BPH (benign prostatic hyperplasia)   . Chronic pain   . Coronary artery disease   . Depression   . Frequent falls   . Hyperlipemia   . Hypertension   . Neuropathy ([place])   . Pernicious anemia   . Stroke [facility])    left sided weakness    Patient Active Problem List   Diagnosis Date Noted  . Protein-calorie malnutrition, severe 07/30/2016  . Falls 07/22/2016  . Weakness of left side of body 07/22/2016  . h/o CVA (cerebral vascular accident) ([place]) with left sided weakness 07/22/2016  . Alcohol abuse 07/22/2016  . Hypotension 07/22/2016  . Frequent falls   . Anemia 11/30/2015  . Hypokalemia 11/30/2015  . Acute renal failure (ARF) ([place]) 11/30/2015  . Acute encephalopathy 09/10/2014  . Alcohol withdrawal delirium ([place]) 09/10/2014  . ARF (acute renal failure) ([place]) 09/10/2014  . Sinus bradycardia 09/10/2014  . HTN (hypertension) 09/10/2014  . CAD (coronary artery disease) 09/10/2014  . PVD (peripheral vascular disease) ([place]) 09/10/2014  . Pernicious anemia 09/10/2014    Past Surgical History:  Procedure Laterality Date  . CARDIAC CATHETERIZATION    . CAROTID ENDARTERECTOMY          Home Medications    Prior to Admission medications   Medication Sig Start Date End Date Taking? Authorizing Provider  albuterol (PROVENTIL HFA;VENTOLIN HFA) 108 (90 BASE) MCG/ACT inhaler Inhale 2 puffs into the lungs every 6 (six) hours as needed for wheezing or shortness of breath.    Historical Provider, MD  ALPRAZolam Duanne Moron) 0.5 MG tablet Take 1 tablet (0.5 mg total) by mouth 2 (two) times daily as needed for anxiety. 08/04/16   Erline Hau, MD  aspirin EC 81 MG tablet Take 81 mg by mouth daily. Reported on 11/16/2015    Historical Provider, MD  baclofen (LIORESAL) 20 MG tablet Take 20 mg by mouth 2 (two) times daily as needed for muscle spasms.    Historical Provider, MD  chlordiazePOXIDE (LIBRIUM) 5 MG capsule Take 1 capsule (5 mg total) by mouth 3 (three) times daily. 08/04/16   Erline Hau, MD  clopidogrel (PLAVIX) 75 MG tablet Take 75 mg by mouth daily.    Historical Provider, MD  Cyanocobalamin (B-12) 1000 MCG/ML KIT Inject 1,000 mcg as directed every 30 (thirty) days.    Historical Provider, MD  DULoxetine (CYMBALTA) 60 MG capsule Take 60 mg by mouth daily.    Historical Provider, MD  folic acid (FOLVITE) 1 MG tablet Take 1 tablet (1 mg total) by mouth daily. 08/04/16  Erline Hau, MD  gabapentin (NEURONTIN) 800 MG tablet Take 800 mg by mouth 3 (three) times daily.    Historical Provider, MD  isosorbide mononitrate (ISMO,MONOKET) 20 MG tablet Take 10 mg by mouth daily.    Historical Provider, MD  metoprolol succinate (TOPROL-XL) 50 MG 24 hr tablet Take 50 mg by mouth 2 (two) times daily. Take with or immediately following a meal.    Historical Provider, MD  Multiple Vitamin (MULTIVITAMIN WITH MINERALS) TABS tablet Take 1 tablet by mouth daily. 09/12/14   Samuella Cota, MD  rosuvastatin (CRESTOR) 40 MG tablet Take 40 mg by mouth daily.    Historical Provider, MD  thiamine 100 MG tablet Take 1 tablet (100 mg total) by mouth daily. 12/03/15    Orvan Falconer, MD  tiotropium (SPIRIVA) 18 MCG inhalation capsule Place 18 mcg into inhaler and inhale daily.     Historical Provider, MD  vitamin E 400 UNIT capsule Take 400 Units by mouth daily.    Historical Provider, MD    Family History Family History  Problem Relation Age of Onset  . Congestive Heart Failure Mother 47  . Heart attack Father 22  . Congestive Heart Failure Sister     Social History Social History  Substance Use Topics  . Smoking status: Current Every Day Smoker    Packs/day: 1.00    Years: 40.00    Types: Cigarettes  . Smokeless tobacco: Never Used     Comment: down to 5 cigarettes/day from 1.5 PPD  . Alcohol use 7.2 oz/week    12 Standard drinks or equivalent per week     Allergies   Codeine   Review of Systems Review of Systems  Unable to perform ROS: Mental status change     Physical Exam Updated Vital Signs BP (!) 176/108 (BP Location: Right Arm)   Pulse 99   Temp 98.1 F (36.7 C) (Oral)   Resp 18   Ht '5\' 4"'  (1.626 m)   Wt 138 lb (62.6 kg)   SpO2 96%   BMI 23.69 kg/m   Physical Exam 0800: Physical examination:  Nursing notes reviewed; Vital signs and O2 SAT reviewed;  Constitutional: Well developed, Well nourished, Well hydrated, In no acute distress; Head:  Normocephalic, atraumatic; Eyes: EOMI, PERRL, No scleral icterus; ENMT: Mouth and pharynx normal, Mucous membranes moist; Neck: Supple, Full range of motion, No lymphadenopathy; Cardiovascular: Regular rate and rhythm, No gallop; Respiratory: Breath sounds clear & equal bilaterally, No wheezes.  Speaking full sentences with ease, Normal respiratory effort/excursion; Chest: Nontender, Movement normal; Abdomen: Soft, Nontender, Nondistended, Normal bowel sounds; Genitourinary: No CVA tenderness; Extremities: Pulses normal, No tenderness, No edema, No calf edema or asymmetry.; Neuro: Sleeping, then awakens to name, then falls back asleep. Alert, confused re: time, events. No facial droop.  Speech clear. +left sided weakness per hx, otherwise no gross focal motor deficits in extremities.; Skin: Color normal, Warm, Dry.   ED Treatments / Results  Labs (all labs ordered are listed, but only abnormal results are displayed)   EKG  EKG Interpretation  Date/Time:  Monday August 22 2016 07:46:58 EST Ventricular Rate:  92 PR Interval:    QRS Duration: 107 QT Interval:  399 QTC Calculation: 497 R Axis:   -40 Text Interpretation:  Sinus rhythm Incomplete left bundle branch block Left ventricular hypertrophy Borderline prolonged QT interval Artifact When compared with ECG of 07/22/2016 QT has lengthened Confirmed by [facility]  MD, Chryl Holten ([id number]) on 08/22/2016 8:03:38 AM  Radiology   Procedures Procedures (including critical care time)  Medications Ordered in ED Medications - No data to display   Initial Impression / Assessment and Plan / ED Course  I have reviewed the triage vital signs and the nursing notes.  Pertinent labs & imaging results that were available during my care of the patient were reviewed by me and considered in my medical decision making (see chart for details).  MDM Reviewed: previous chart, nursing note and vitals Reviewed previous: labs and ECG Interpretation: labs, ECG, x-ray and CT scan   Results for orders placed or performed during the hospital encounter of 08/22/16  Comprehensive metabolic panel  Result Value Ref Range   Sodium 143 135 - 145 mmol/L   Potassium 2.9 (L) 3.5 - 5.1 mmol/L   Chloride 105 101 - 111 mmol/L   CO2 31 22 - 32 mmol/L   Glucose, Bld 148 (H) 65 - 99 mg/dL   BUN 14 6 - 20 mg/dL   Creatinine, Ser 1.04 0.61 - 1.24 mg/dL   Calcium 9.7 8.9 - 10.3 mg/dL   Total Protein 7.7 6.5 - 8.1 g/dL   Albumin 3.4 (L) 3.5 - 5.0 g/dL   AST 21 15 - 41 U/L   ALT 14 (L) 17 - 63 U/L   Alkaline Phosphatase 69 38 - 126 U/L   Total Bilirubin 0.4 0.3 - 1.2 mg/dL   GFR calc non Af Amer >60 >60 mL/min   GFR calc Af Amer >60 >60  mL/min   Anion gap 7 5 - 15  CBC  Result Value Ref Range   WBC 13.5 (H) 4.0 - 10.5 K/uL   RBC 4.22 4.22 - 5.81 MIL/uL   Hemoglobin 13.1 13.0 - 17.0 g/dL   HCT 39.6 39.0 - 52.0 %   MCV 93.8 78.0 - 100.0 fL   MCH 31.0 26.0 - 34.0 pg   MCHC 33.1 30.0 - 36.0 g/dL   RDW 15.1 11.5 - 15.5 %   Platelets 338 150 - 400 K/uL  Differential  Result Value Ref Range   Neutrophils Relative % 90 %   Neutro Abs 12.0 (H) 1.7 - 7.7 K/uL   Lymphocytes Relative 6 %   Lymphs Abs 0.9 0.7 - 4.0 K/uL   Monocytes Relative 4 %   Monocytes Absolute 0.6 0.1 - 1.0 K/uL   Eosinophils Relative 0 %   Eosinophils Absolute 0.0 0.0 - 0.7 K/uL   Basophils Relative 0 %   Basophils Absolute 0.0 0.0 - 0.1 K/uL  Troponin I  Result Value Ref Range   Troponin I <0.03 <0.03 ng/mL  Ethanol  Result Value Ref Range   Alcohol, Ethyl (B) <5 <5 mg/dL  Acetaminophen level  Result Value Ref Range   Acetaminophen (Tylenol), Serum <10 (L) 10 - 30 ug/mL  Salicylate level  Result Value Ref Range   Salicylate Lvl <5.9 2.8 - 30.0 mg/dL  Urine rapid drug screen (hosp performed)  Result Value Ref Range   Opiates NONE DETECTED NONE DETECTED   Cocaine NONE DETECTED NONE DETECTED   Benzodiazepines POSITIVE (A) NONE DETECTED   Amphetamines NONE DETECTED NONE DETECTED   Tetrahydrocannabinol NONE DETECTED NONE DETECTED   Barbiturates NONE DETECTED NONE DETECTED  Urinalysis, Routine w reflex microscopic  Result Value Ref Range   Color, Urine YELLOW YELLOW   APPearance HAZY (A) CLEAR   Specific Gravity, Urine 1.011 1.005 - 1.030   pH 6.0 5.0 - 8.0   Glucose, UA NEGATIVE NEGATIVE mg/dL   Hgb urine  dipstick NEGATIVE NEGATIVE   Bilirubin Urine NEGATIVE NEGATIVE   Ketones, ur NEGATIVE NEGATIVE mg/dL   Protein, ur 100 (A) NEGATIVE mg/dL   Nitrite NEGATIVE NEGATIVE   Leukocytes, UA MODERATE (A) NEGATIVE   RBC / HPF 0-5 0 - 5 RBC/hpf   WBC, UA 6-30 0 - 5 WBC/hpf   Bacteria, UA RARE (A) NONE SEEN  Magnesium  Result Value Ref Range    Magnesium 1.9 1.7 - 2.4 mg/dL  I-Stat Chem 8, ED  Result Value Ref Range   Sodium 147 (H) 135 - 145 mmol/L   Potassium 3.0 (L) 3.5 - 5.1 mmol/L   Chloride 104 101 - 111 mmol/L   BUN 14 6 - 20 mg/dL   Creatinine, Ser 1.10 0.61 - 1.24 mg/dL   Glucose, Bld 142 (H) 65 - 99 mg/dL   Calcium, Ion 1.26 1.15 - 1.40 mmol/L   TCO2 30 0 - 100 mmol/L   Hemoglobin 12.9 (L) 13.0 - 17.0 g/dL   HCT 38.0 (L) 39.0 - 52.0 %    Dg Chest 1 View Result Date: 08/22/2016 CLINICAL DATA:  Altered mental status this morning. Cough since last night. History of coronary artery disease, previous CVA, long-term smoker, alcohol abuse with encephalopathy and withdrawal episodes. EXAM: CHEST 1 VIEW COMPARISON:  Chest x-ray of July 22, 2016 FINDINGS: The lungs are adequately inflated. The interstitial markings are coarse though stable. There is subtle increased density in the right infrahilar region however that is slightly more conspicuous than in the past. The heart is normal in size. The pulmonary vascularity is not engorged. There is calcification in the wall of the aortic arch. There is old deformity of the midshaft of the right clavicle. IMPRESSION: Chronic interstitial prominence likely related to the patient's smoking history. However, there is superimposed slightly increased interstitial density at the right lung base which may reflect early aspiration pneumonia in the appropriate clinical setting. There is no CHF or alveolar pneumonia. Thoracic aortic atherosclerosis. Electronically Signed   By: David  Martinique M.D.   On: 08/22/2016 09:19   Ct Head Wo Contrast Result Date: 08/22/2016 CLINICAL DATA:  Acute disorientation beginning last night. EXAM: CT HEAD WITHOUT CONTRAST TECHNIQUE: Contiguous axial images were obtained from the base of the skull through the vertex without intravenous contrast. COMPARISON:  MRI 07/22/2016 and CT 07/22/2016 FINDINGS: Brain: Old left cerebellar infarction as seen previously. Mild to  moderate chronic small-vessel ischemic changes of the hemispheric white matter with old lacunar infarctions in the left basal ganglia. No evidence of acute infarction, mass lesion, hemorrhage, hydrocephalus or extra-axial collection. Vascular: There is atherosclerotic calcification of the major vessels at the base of the brain. Skull: Negative Sinuses/Orbits: Clear/normal Other: None significant IMPRESSION: No acute finding by CT.  Old ischemic changes as outlined above. Electronically Signed   By: Nelson Chimes M.D.   On: 08/22/2016 09:06         1035:  Pt passed bedside swallow screen. Potassium repleted IV and PO. Pt's friend at bedside states pt's family "pulled him out of the SNF and took him home but don't take care of him." Pt's friend thinks "he needs to be back in the SNF."  Will have CM investigate further. Will dose IV unasyn for possible aspiration pneumonia on CXR. T/C to [facility]. Roderic Palau, case discussed, including:  HPI, pertinent PM/SHx, VS/PE, dx testing, ED course and treatment:  Agreeable to observation admit.    Final Clinical Impressions(s) / ED Diagnoses   Final diagnoses:  None  New Prescriptions New Prescriptions   No medications on file     Francine Graven, DO 08/28/16 1532

## 2016-08-22 NOTE — Progress Notes (Signed)
08/22/16  1300  Patient admitted with AMS. Pt is alert to self. Pt is not able to complete the admission history questions and there is no family present.

## 2016-08-22 NOTE — Progress Notes (Signed)
Blood draw for ammonia test deemed useless by blood bank.  Md notified and new order placed for fresh blood(on ice) and repeat test for ammonia levels.

## 2016-08-23 ENCOUNTER — Other Ambulatory Visit: Payer: Self-pay | Admitting: *Deleted

## 2016-08-23 DIAGNOSIS — N390 Urinary tract infection, site not specified: Secondary | ICD-10-CM | POA: Diagnosis not present

## 2016-08-23 DIAGNOSIS — E876 Hypokalemia: Secondary | ICD-10-CM | POA: Diagnosis not present

## 2016-08-23 DIAGNOSIS — G9340 Encephalopathy, unspecified: Secondary | ICD-10-CM | POA: Diagnosis not present

## 2016-08-23 DIAGNOSIS — F1010 Alcohol abuse, uncomplicated: Secondary | ICD-10-CM | POA: Diagnosis not present

## 2016-08-23 DIAGNOSIS — J690 Pneumonitis due to inhalation of food and vomit: Secondary | ICD-10-CM | POA: Diagnosis not present

## 2016-08-23 LAB — COMPREHENSIVE METABOLIC PANEL
ALT: 11 U/L — ABNORMAL LOW (ref 17–63)
AST: 21 U/L (ref 15–41)
Albumin: 2.7 g/dL — ABNORMAL LOW (ref 3.5–5.0)
Alkaline Phosphatase: 55 U/L (ref 38–126)
Anion gap: 6 (ref 5–15)
BILIRUBIN TOTAL: 0.5 mg/dL (ref 0.3–1.2)
BUN: 11 mg/dL (ref 6–20)
CHLORIDE: 118 mmol/L — AB (ref 101–111)
CO2: 23 mmol/L (ref 22–32)
Calcium: 8.5 mg/dL — ABNORMAL LOW (ref 8.9–10.3)
Creatinine, Ser: 0.93 mg/dL (ref 0.61–1.24)
Glucose, Bld: 87 mg/dL (ref 65–99)
Potassium: 4.7 mmol/L (ref 3.5–5.1)
Sodium: 147 mmol/L — ABNORMAL HIGH (ref 135–145)
TOTAL PROTEIN: 6 g/dL — AB (ref 6.5–8.1)

## 2016-08-23 LAB — CBC
HEMATOCRIT: 35.8 % — AB (ref 39.0–52.0)
Hemoglobin: 11.2 g/dL — ABNORMAL LOW (ref 13.0–17.0)
MCH: 30.4 pg (ref 26.0–34.0)
MCHC: 31.3 g/dL (ref 30.0–36.0)
MCV: 97 fL (ref 78.0–100.0)
PLATELETS: 228 10*3/uL (ref 150–400)
RBC: 3.69 MIL/uL — AB (ref 4.22–5.81)
RDW: 15.4 % (ref 11.5–15.5)
WBC: 9.4 10*3/uL (ref 4.0–10.5)

## 2016-08-23 LAB — MAGNESIUM: MAGNESIUM: 1.8 mg/dL (ref 1.7–2.4)

## 2016-08-23 LAB — HIV ANTIBODY (ROUTINE TESTING W REFLEX): HIV Screen 4th Generation wRfx: NONREACTIVE

## 2016-08-23 LAB — AMMONIA: Ammonia: 12 umol/L (ref 9–35)

## 2016-08-23 MED ORDER — POTASSIUM CHLORIDE ER 20 MEQ PO TBCR: 20.0000 meq | EXTENDED_RELEASE_TABLET | Freq: Every day | ORAL | 0 refills | Status: AC

## 2016-08-23 MED ORDER — AMOXICILLIN-POT CLAVULANATE 875-125 MG PO TABS: 1.0000 | ORAL_TABLET | Freq: Two times a day (BID) | ORAL | 0 refills | Status: DC

## 2016-08-23 MED ORDER — CHLORDIAZEPOXIDE HCL 5 MG PO CAPS: 5.0000 mg | ORAL_CAPSULE | Freq: Two times a day (BID) | ORAL | 0 refills | Status: DC | PRN

## 2016-08-23 NOTE — Patient Outreach (Signed)
[facility]) Care Management  08/23/2016  Maynard 04-25-1956 [id number]  Mr. Bronson Vodicka is a 61 y.o. Gentleman who lives in [place] with his wife. He has a medical history which includes alcohol abuse with hospitalizations for encephalopathy and stroke. He was discharged from the hospital on 2/8 after being treated for renal failure and alcohol withdrawal and went to [facility] skilled nursing facility for physical rehab care. He discharged from [facility] to home over the past weekend and was referred to [facility] for follow up. I reached outt to Mr. Kochan at home yesterday but he had already reported to the ED.  Mr. Maton reported to the ED yesterday morning after he awakened at 2am with altered mental status/confusion. He was evaluated with CT Scan of the head which was unremarkable. Chest x-ray indicated a possible aspiration pneumonia and Urinalysis indicated possible UTI. In addition, Mr. Deitch had hypokalemia (K + =  2.9). He was admitted to the hospital yesterday on 08/22/16 for treatment of acute encephalopathy, hypokalemia, aspiration pneumonia, and possible UTI.   I am familiar with Mr. Sheffler from our work with him in 2016. Mr. Sines was referred to [facility] from his provider's office after a hospitalization for encephalopathy. He was followed by social work, nursing, and pharmacy at that time for community resources/psychosocial needs/ medication management and adherence, hypertension management, and fall risk reduction.   When Mr. Gillyard was discharged from care management services, he was still drinking but had decreased his intake considerably. He was taking all his medications as prescribed and had taken up his favorite hobby - cooking. He began attending scheduled provider appointments and self monitoring his blood pressure.   Plan: [facility] Management will follow Mr. Adney's progress and provide assistance  with transition of care services and planning for community follow up and care coordination.    [facility]  [phone]

## 2016-08-23 NOTE — Care Management Obs Status (Signed)
[facility] NOTIFICATION   Patient Details  Name: George Valdez MRN: [id number] Date of Birth: 02-04-1956   Medicare Observation Status Notification Given:  Yes    Sherald Barge, RN 08/23/2016, 1:29 PM
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
## 2016-08-23 NOTE — Discharge Summary (Addendum)
Physician Discharge Summary  George Valdez [id number] DOB: 1956/05/14 DOA: 08/22/2016  PCP: Jana Half  Admit date: 08/22/2016 Discharge date: 08/23/2016  Admitted From: Home Disposition:  Home  Recommendations for Outpatient Follow-up:  1. Follow up with PCP in 1-2 weeks 2. Please obtain BMP/CBC in one week  Home Health: Home health RN and PT Equipment/Devices:  Discharge Condition: Stable CODE STATUS: Full Diet recommendation: Heart Healthy  Brief/Interim Summary: George Valdez is a 61 y.o. male with medical history significant of alcohol abuse and previous stroke, was recently discharged from hospital on 2/8 after being treated for renal failure and alcohol withdrawal. He was discharged to a skilled nursing facility. It is unclear when the patient was discharged home. Apparently, patient woke up at 2:00 this morning "wanted to go to work". The patient had not worked in years. He is unable to provide any significant history at this time and no family is present. He denies any complaints at this time. He is somewhat disoriented, does not know that he is in the hospital and does not know the year.  Patient was monitored in the hospital and started on IV fluids. He was treated with intravenous Unasyn for possible aspiration pneumonia and urinary tract infection. He did not have any fevers while in the hospital. Librium and baclofen were held in the hospital. The following day, the patient's mental status had significantly improved. Confusion had resolved and he was alert and oriented. Speaking appropriately. The patient will be transitioned to a course of Augmentin. Librium dose has been decreased and changed to when necessary. Baclofen has been discontinued. He was seen by physical therapy recommended home health physical therapy. Patient has denied any further skilled nursing facility placement. His encephalopathy is felt to be multifactorial, related to  medications, possible infection and some degree of dehydration. Since the patient has recurrent hypokalemia, will discharge him on a maintenance dose of potassium. He will need a repeat basic metabolic panel in 1 week. He otherwise appears stable for discharge.  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   Alcohol abuse   Altered mental status   Aspiration pneumonia (HCC)   UTI (urinary tract infection)    Discharge Instructions  Discharge Instructions    Diet - low sodium heart healthy    Complete by:  As directed    Increase activity slowly    Complete by:  As directed      Allergies as of 08/23/2016      Reactions   Codeine Itching      Medication List    STOP taking these medications   baclofen 20 MG tablet Commonly known as:  LIORESAL     TAKE these medications   albuterol 108 (90 Base) MCG/ACT inhaler Commonly known as:  PROVENTIL HFA;VENTOLIN HFA Inhale 2 puffs into the lungs every 6 (six) hours as needed for wheezing or shortness of breath.   amoxicillin-clavulanate 875-125 MG tablet Commonly known as:  AUGMENTIN Take 1 tablet by mouth 2 (two) times daily.   aspirin EC 81 MG tablet Take 81 mg by mouth daily. Reported on 11/16/2015   chlordiazePOXIDE 5 MG capsule Commonly known as:  LIBRIUM Take 1 capsule (5 mg total) by mouth 2 (two) times daily as needed for anxiety. What changed:  when to take this  reasons to take this   clopidogrel 75 MG tablet Commonly  known as:  PLAVIX Take 75 mg by mouth daily.   cyanocobalamin 1000 MCG/ML injection Commonly known as:  (VITAMIN B-12) Inject 1,000 mcg into the muscle every 30 (thirty) days.   DULoxetine 60 MG capsule Commonly known as:  CYMBALTA Take 60 mg by mouth daily.   folic acid 1 MG tablet Commonly known as:  FOLVITE Take 1 tablet (1 mg total) by mouth daily.    gabapentin 800 MG tablet Commonly known as:  NEURONTIN Take 800 mg by mouth 3 (three) times daily.   isosorbide mononitrate 20 MG tablet Commonly known as:  ISMO,MONOKET Take 10 mg by mouth daily.   metoprolol succinate 50 MG 24 hr tablet Commonly known as:  TOPROL-XL Take 50 mg by mouth 2 (two) times daily. Take with or immediately following a meal.   multivitamin with minerals Tabs tablet Take 1 tablet by mouth daily.   Potassium Chloride ER 20 MEQ Tbcr Take 20 mEq by mouth daily.   rosuvastatin 40 MG tablet Commonly known as:  CRESTOR Take 40 mg by mouth daily.   thiamine 100 MG tablet Take 1 tablet (100 mg total) by mouth daily.   tiotropium 18 MCG inhalation capsule Commonly known as:  SPIRIVA Place 18 mcg into inhaler and inhale daily.   vitamin E 400 UNIT capsule Take 400 Units by mouth daily.      Follow-up [facility] Follow up.   Contact information: [place] [address] [phone]          Allergies  Allergen Reactions  . Codeine Itching    Consultations:     Procedures/Studies: Dg Chest 1 View  Result Date: 08/22/2016 CLINICAL DATA:  Altered mental status this morning. Cough since last night. History of coronary artery disease, previous CVA, long-term smoker, alcohol abuse with encephalopathy and withdrawal episodes. EXAM: CHEST 1 VIEW COMPARISON:  Chest x-ray of July 22, 2016 FINDINGS: The lungs are adequately inflated. The interstitial markings are coarse though stable. There is subtle increased density in the right infrahilar region however that is slightly more conspicuous than in the past. The heart is normal in size. The pulmonary vascularity is not engorged. There is calcification in the wall of the aortic arch. There is old deformity of the midshaft of the right clavicle. IMPRESSION: Chronic interstitial prominence likely related to the patient's smoking history. However, there is  superimposed slightly increased interstitial density at the right lung base which may reflect early aspiration pneumonia in the appropriate clinical setting. There is no CHF or alveolar pneumonia. Thoracic aortic atherosclerosis. Electronically Signed   By: David  Martinique M.D.   On: 08/22/2016 09:19   Ct Head Wo Contrast  Result Date: 08/22/2016 CLINICAL DATA:  Acute disorientation beginning last night. EXAM: CT HEAD WITHOUT CONTRAST TECHNIQUE: Contiguous axial images were obtained from the base of the skull through the vertex without intravenous contrast. COMPARISON:  MRI 07/22/2016 and CT 07/22/2016 FINDINGS: Brain: Old left cerebellar infarction as seen previously. Mild to moderate chronic small-vessel ischemic changes of the hemispheric white matter with old lacunar infarctions in the left basal ganglia. No evidence of acute infarction, mass lesion, hemorrhage, hydrocephalus or extra-axial collection. Vascular: There is atherosclerotic calcification of the major vessels at the base of the brain. Skull: Negative Sinuses/Orbits: Clear/normal Other: None significant IMPRESSION: No acute finding by CT.  Old ischemic changes as outlined above. Electronically Signed   By: Nelson Chimes M.D.   On: 08/22/2016 09:06  Dg Swallowing Func-speech Pathology  Result Date: 08/04/2016 Objective Swallowing Evaluation: Type of Study: MBS-Modified Barium Swallow Study Patient Details Name: LEMAN BOLASH MRN: [id number] Date of Birth: Dec 02, 1955 Today's Date: 08/04/2016 Time: SLP Start Time (ACUTE ONLY): 1235-SLP Stop Time (ACUTE ONLY): 1307 SLP Time Calculation (min) (ACUTE ONLY): 32 min Past Medical History: Past Medical History: Diagnosis Date . Anxiety  . BPH (benign prostatic hyperplasia)  . Chronic pain  . Coronary artery disease  . Depression  . Hyperlipemia  . Hypertension  . Neuropathy ([place])  . Pernicious anemia  . Stroke [facility])  Past Surgical History: Past Surgical History: Procedure Laterality Date . CARDIAC  CATHETERIZATION   . CAROTID ENDARTERECTOMY   HPI: Dauson Lawman Campleseis a 61 y.o.malewith medical history significant of previous CVA with left sided weakness, etoh abuse last drink 12 days ago, chronic pain, HTN, CKD 4, CAD , COPD comes in with frequent falls and says he is weaker than normal. In the ER he was found to be severely dehydrated, was severely hypokalemic and ARF. With hydration and electrolyte supplementation his electrolyte abnormalities and ARF have resolved, he is now in full blown DTs and has developed diarrhea for which C. difficile infection is being ruled out. Severe dehydration, ARF with severe hyperkalemia &hypomagnesemia . This was from poor oral intake, Replaced potassium &Mag aggressively, ARF resolved, He developed diarrhea night of 07/26/2016 causing further potassium and magnesium loss, electrolytes will be replaced again. MRI shows: Punctate 4 mm focus of diffusion abnormality within the cortical gray matter of the left parietal lobe, suspicious for tiny acute/early subacute ischemic infarct. No associated hemorrhage. Large remote left cerebellar infarct, with additional small remote left frontal cortical infarct and remote lacunar infarcts within the left basal ganglia. Chest x-ray: Minimal left base atelectasis or infiltrate. Pt currently afebrile, but did have a fever yesterday. BSE ordered to evaluate safety for po intake. Subjective: "The cardiac diet is for the birds." Assessment / Plan / Recommendation CHL IP CLINICAL IMPRESSIONS 08/04/2016 Therapy Diagnosis Mild oral phase dysphagia;Moderate pharyngeal phase dysphagia Clinical Impression Pt presents with mild oral phase and moderate pharyngeal phase dysphagia with suspected neurogenic and cognitive based etiology. Pt with history of previous left cerebellar and basal ganglia CVA in the past with possible new left parietal lobe stroke (MRI this visit) and pt on detox from ETOH protocol. Pt with swallow trigger at the  level of the valleculae for puree and regular textures resulting in good pharyngeal clearance, epiglottic deflection, and little to know residuals. Pt demonstrated poor tongue base retraction and epiglottic deflection with liquids (thin and nectar) which resulted in penetration and aspiration of thins during and after the swallow. Pt had one frank episode of aspiration of thins (taken via straw sips) after the swallow from residuals in pyriforms and no spontaneous cough response elicited. Pt cued to cough and he produced weak cough which was ineffective in clearing from trachea. Pt demonstrated impulsivity with self presentations of all liquids, taking large sequential sips despite verbal cues to take "small sips". Pt penetrated thin liquids during and after the swallow which resulted in eventual aspiration (to cords and then back above) in trace amounts which were occasionally sensed, but not completely cleared from underside of epiglottis/vestibule. Pt with moderate vallecular and pyriform residue with thins and nectars after the swallow and pt required moderate verbal cues to continue swallowing to clear. Although no aspiration of nectar was observed, pt is at risk due to residuals in pharynx. Recommend D3/mech soft with NTL  via cup sips/ no straws and f/u with SLP at SNF for continued dysphagia therapy with a focus on improving endurance, alertness, epiglottic deflection, producing strong cough, and swallowing on demand. Pt appeared to become more fatigued as study progressed. Treating SLP can likely upgrade pt to thins clinically once vocal quality improves (pt with wet vocal quality when penetration/aspiration observed and loose, wet cough when cued to cough) and pt closer to baseline. Note that Pt with elevated WBC for the past two days, but no fever and no recent chest x-ray. Results will be faxed directly to [facility]. Impact on safety and function Moderate aspiration risk   CHL IP TREATMENT RECOMMENDATION  08/04/2016 Treatment Recommendations Defer treatment plan to f/u with SLP   Prognosis 08/04/2016 Prognosis for Safe Diet Advancement Fair Barriers to Reach Goals Cognitive deficits;Behavior;Severity of deficits Barriers/Prognosis Comment -- CHL IP DIET RECOMMENDATION 08/04/2016 SLP Diet Recommendations Dysphagia 3 (Mech soft) solids;Nectar thick liquid;Free water protocol after oral care Liquid Administration via Cup;No straw Medication Administration Whole meds with liquid Compensations Minimize environmental distractions;Slow rate;Small sips/bites;Clear throat intermittently Postural Changes Remain semi-upright after after feeds/meals (Comment);Seated upright at 90 degrees   CHL IP OTHER RECOMMENDATIONS 08/04/2016 Recommended Consults -- Oral Care Recommendations Oral care BID;Staff/trained caregiver to provide oral care Other Recommendations Order thickener from pharmacy;Prohibited food (jello, ice cream, thin soups);Clarify dietary restrictions   CHL IP FOLLOW UP RECOMMENDATIONS 08/04/2016 Follow up Recommendations Skilled Nursing facility   [facility] IP FREQUENCY AND DURATION 07/28/2016 Speech Therapy Frequency (ACUTE ONLY) min 2x/week Treatment Duration 1 week      CHL IP ORAL PHASE 08/04/2016 Oral Phase Impaired Oral - Pudding Teaspoon -- Oral - Pudding Cup -- Oral - Honey Teaspoon -- Oral - Honey Cup -- Oral - Nectar Teaspoon -- Oral - Nectar Cup -- Oral - Nectar Straw -- Oral - Thin Teaspoon -- Oral - Thin Cup -- Oral - Thin Straw -- Oral - Puree -- Oral - Mech Soft -- Oral - Regular Weak lingual manipulation;Delayed oral transit Oral - Multi-Consistency -- Oral - Pill -- Oral Phase - Comment --  CHL IP PHARYNGEAL PHASE 08/04/2016 Pharyngeal Phase Impaired Pharyngeal- Pudding Teaspoon -- Pharyngeal -- Pharyngeal- Pudding Cup -- Pharyngeal -- Pharyngeal- Honey Teaspoon -- Pharyngeal -- Pharyngeal- Honey Cup -- Pharyngeal -- Pharyngeal- Nectar Teaspoon -- Pharyngeal -- Pharyngeal- Nectar Cup Delayed swallow  initiation-vallecula;Reduced epiglottic inversion;Reduced airway/laryngeal closure;Pharyngeal residue - valleculae;Pharyngeal residue - pyriform Pharyngeal -- Pharyngeal- Nectar Straw -- Pharyngeal -- Pharyngeal- Thin Teaspoon -- Pharyngeal -- Pharyngeal- Thin Cup Delayed swallow initiation-vallecula;Delayed swallow initiation-pyriform sinuses;Reduced epiglottic inversion;Reduced airway/laryngeal closure;Reduced tongue base retraction;Penetration/Aspiration during swallow;Penetration/Apiration after swallow;Trace aspiration;Moderate aspiration;Pharyngeal residue - valleculae;Pharyngeal residue - pyriform Pharyngeal Material does not enter airway;Material enters airway, passes BELOW cords then ejected out;Material enters airway, CONTACTS cords and not ejected out;Material enters airway, remains ABOVE vocal cords and not ejected out Pharyngeal- Thin Straw Delayed swallow initiation-pyriform sinuses;Delayed swallow initiation-vallecula;Reduced epiglottic inversion;Reduced airway/laryngeal closure;Reduced tongue base retraction;Penetration/Aspiration during swallow;Penetration/Apiration after swallow;Moderate aspiration;Pharyngeal residue - valleculae;Pharyngeal residue - pyriform Pharyngeal Material enters airway, passes BELOW cords without attempt by patient to eject out (silent aspiration) Pharyngeal- Puree Delayed swallow initiation-vallecula Pharyngeal -- Pharyngeal- Mechanical Soft -- Pharyngeal -- Pharyngeal- Regular WFL;Delayed swallow initiation-vallecula Pharyngeal -- Pharyngeal- Multi-consistency -- Pharyngeal -- Pharyngeal- Pill -- Pharyngeal -- Pharyngeal Comment Primary deficit appeared to be related to decreased epiglottic deflection with liquids  CHL IP CERVICAL ESOPHAGEAL PHASE 08/04/2016 Cervical Esophageal Phase WFL Pudding Teaspoon -- Pudding Cup -- Honey Teaspoon -- Honey Cup -- Nectar Teaspoon --  Nectar Cup -- [employer] -- Thin Teaspoon -- Thin Cup -- Thin Straw -- Puree -- Mechanical Soft --  Regular -- Multi-consistency -- Pill -- Cervical Esophageal Comment -- Thank you, Genene Churn, [place] No flowsheet data found. PORTER,DABNEY 08/04/2016, 5:43 PM                 Subjective: Denies any shortness of breath or cough. Wants to go home.  Discharge Exam: Vitals:   08/23/16 1014 08/23/16 1413  BP: (!) 185/71 132/74  Pulse: 63 70  Resp:  16  Temp:  98.8 F (37.1 C)   Vitals:   08/23/16 0512 08/23/16 0812 08/23/16 1014 08/23/16 1413  BP: 125/62  (!) 185/71 132/74  Pulse: 61  63 70  Resp: 16   16  Temp: 97.6 F (36.4 C)   98.8 F (37.1 C)  TempSrc: Oral   Oral  SpO2: 100% 98%  98%  Weight:      Height:        General: Pt is alert, awake, not in acute distress Cardiovascular: RRR, S1/S2 +, no rubs, no gallops Respiratory: CTA bilaterally, no wheezing, no rhonchi Abdominal: Soft, NT, ND, bowel sounds + Extremities: no edema, no cyanosis    The results of significant diagnostics from this hospitalization (including imaging, microbiology, ancillary and laboratory) are listed below for reference.     Microbiology: No results found for this or any previous visit (from the past 240 hour(s)).   Labs: BNP (last 3 results) No results for input(s): BNP in the last 8760 hours. Basic Metabolic Panel:  Recent Labs Lab 08/22/16 0750 08/22/16 0757 08/23/16 0458  NA 143 147* 147*  K 2.9* 3.0* 4.7  CL 105 104 118*  CO2 31  --  23  GLUCOSE 148* 142* 87  BUN 14 14 11   CREATININE 1.04 1.10 0.93  CALCIUM 9.7  --  8.5*  MG  --  1.9 1.8   Liver Function Tests:  Recent Labs Lab 08/22/16 0750 08/23/16 0458  AST 21 21  ALT 14* 11*  ALKPHOS 69 55  BILITOT 0.4 0.5  PROT 7.7 6.0*  ALBUMIN 3.4* 2.7*   No results for input(s): LIPASE, AMYLASE in the last 168 hours.  Recent Labs Lab 08/23/16 0745  AMMONIA 12   CBC:  Recent Labs Lab 08/22/16 0750 08/22/16 0757 08/23/16 0458  WBC 13.5*  --  9.4  NEUTROABS  --  12.0*  --   HGB 13.1 12.9*  11.2*  HCT 39.6 38.0* 35.8*  MCV 93.8  --  97.0  PLT 338  --  228   Cardiac Enzymes:  Recent Labs Lab 08/22/16 0757  TROPONINI <0.03   BNP: Invalid input(s): POCBNP CBG: No results for input(s): GLUCAP in the last 168 hours. D-Dimer No results for input(s): DDIMER in the last 72 hours. Hgb A1c No results for input(s): HGBA1C in the last 72 hours. Lipid Profile No results for input(s): CHOL, HDL, LDLCALC, TRIG, CHOLHDL, LDLDIRECT in the last 72 hours. Thyroid function studies  Recent Labs  08/22/16 1801  TSH 0.369   Anemia work up No results for input(s): VITAMINB12, FOLATE, FERRITIN, TIBC, IRON, RETICCTPCT in the last 72 hours. Urinalysis    Component Value Date/Time   COLORURINE YELLOW 08/22/2016 0937   APPEARANCEUR HAZY (A) 08/22/2016 0937   LABSPEC 1.011 08/22/2016 [place] 6.0 08/22/2016 [place] 08/22/2016 [place] 08/22/2016 [place] NEGATIVE 08/22/2016 [id number]   Benjamin Stain  NEGATIVE 08/22/2016 0937   PROTEINUR 100 (A) 08/22/2016 0937   UROBILINOGEN 0.2 09/10/2014 0908   NITRITE NEGATIVE 08/22/2016 0937   LEUKOCYTESUR MODERATE (A) 08/22/2016 0937   Sepsis Labs Invalid input(s): PROCALCITONIN,  WBC,  LACTICIDVEN Microbiology No results found for this or any previous visit (from the past 240 hour(s)).   Time coordinating discharge: Over 30 minutes  SIGNED:   Kathie Dike, MD  [facility] 08/23/2016, 3:40 PM Pager   If 7PM-7AM, please contact night-coverage [email or web address] Password [email or web address]

## 2016-08-23 NOTE — Care Management Note (Signed)
Case Management Note  Patient Details  Name: George Valdez MRN: [id number] Date of Birth: 19-Dec-1955  Subjective/Objective:                  Pt admitted with AMS. He is from home, lives with wife. He was recently DC'd from SNF and is active with [facility]. Pt plans to return home with resumption of his [facility] services through [employer]. [place], from [place] aware of admission as observation and DC today. Pt does not need order to resume services. Pt aware HH has 48hrs to resume services. Pt has walker and hoverround. No DME needs at this time. Wife at bedside for DC plan discussion.  Action/Plan: Pt plans to return home with [facility] services today.   Expected Discharge Date:    08/23/2016             Expected Discharge Plan:  [place]  In-House Referral:  Clinical Social Work  Discharge planning Services  CM Consult  Post Acute Care Choice:  Home Health, Resumption of Svcs/PTA Provider Choice offered to:  Patient HH Arranged:  Therapist, sports, PT [facility] Agency:  [place]  Status of Service:  Completed, signed off  Sherald Barge, RN 08/23/2016, 1:34 PM

## 2016-08-25 LAB — URINE CULTURE: Culture: >=100000 — AB

## 2016-08-26 ENCOUNTER — Other Ambulatory Visit: Payer: Self-pay | Admitting: *Deleted

## 2016-08-26 NOTE — Patient Outreach (Signed)
[facility]) Care Management  08/26/2016  Tremon Hollan [place] 1956-06-08 [id number]  I have been unable to reach Mr. Quaye or his wife at any of the numbers listed. I reached out to Arty Baumgartner, Patient Care coordinator at [facility] and [name], Admissions Coordinator at [facility] to request contact information and notify them that I have not been able to reach Mr. Wnek since his release from [facility].   Plan: I will follow up and continue with collaboration with the primary care team next week.    [place] [facility]  [phone]

## 2016-08-26 NOTE — Progress Notes (Signed)
Discharge instructions read to patient and his family. Both verbalized understanding. Discharged to hme with family

## 2016-08-29 ENCOUNTER — Other Ambulatory Visit: Payer: Self-pay | Admitting: *Deleted

## 2016-08-29 NOTE — Patient Outreach (Signed)
[facility]) Care Management  08/29/2016  [place] 1955/11/20 [id number]  I have tried to reach Mr. Lien by phone at multiple numbers listed on several occasions since his discharge from [facility] facility and have been unable to reach him. I have collaborated with Arty Baumgartner, patient care coordinator at [facility] and with [name], admission/discharge coordinator at [facility] nursing facility, both of whom have also tried to reach Mr. Neeb unsuccessfully since his discharge from the facility. I also collaborate with [employer] [facility] LCSW [name] re: Mr. Nuzum.   In light of Mr. Rhoderick complex medical history, history of cognitive impairment, and recent extended illness, I called the [facility] and requested a safety check at the patient's home. I spoke with Anderson Malta in dispatch who stated she would send police and paramedics given Mr. Meany's medical history.   Plan: I will follow up with the aforementioned team when more information is available to me.    [place] [facility]  [phone]

## 2016-08-30 ENCOUNTER — Other Ambulatory Visit: Payer: Self-pay | Admitting: *Deleted

## 2016-08-30 ENCOUNTER — Encounter: Payer: Self-pay | Admitting: *Deleted

## 2016-08-30 NOTE — Patient Outreach (Signed)
[facility]) Care Management  08/30/2016  George Valdez December 14, 1955 [id number]  George Valdez is a 61 y.o. Gentleman who lives in [place] with his wife. He has a medical history which includes alcohol abuse with hospitalizations for encephalopathy and stroke. He was discharged from the hospital on 2/8 after being treated for renal failure and alcohol withdrawal and went to [facility] skilled nursing facility for physical rehab care. He discharged from [facility] and was referred to [facility] for follow up. [name] returned to the ED within 48 hours of hospital discharge. I reached to [name] on 4 separate occasions, collaborated with the PCP office, and with the SNF admissions/discharge coordinator. After a police safety check I was able to make contact with [name]. He expressed his appreciation for my call but said he didn't feel he was in need of care management services at this time. I offered my contact information and encouraged him to call if he changed his mind or if care management needs arose.   Plan: Case Closure.    [place] [facility]  [phone]

## 2016-10-17 DIAGNOSIS — G894 Chronic pain syndrome: Secondary | ICD-10-CM | POA: Diagnosis not present

## 2016-10-17 DIAGNOSIS — Z6827 Body mass index (BMI) 27.0-27.9, adult: Secondary | ICD-10-CM | POA: Diagnosis not present

## 2016-10-17 DIAGNOSIS — I2510 Atherosclerotic heart disease of native coronary artery without angina pectoris: Secondary | ICD-10-CM | POA: Diagnosis not present

## 2016-10-17 DIAGNOSIS — J449 Chronic obstructive pulmonary disease, unspecified: Secondary | ICD-10-CM | POA: Diagnosis not present

## 2016-10-17 DIAGNOSIS — I10 Essential (primary) hypertension: Secondary | ICD-10-CM | POA: Diagnosis not present

## 2016-10-17 DIAGNOSIS — E663 Overweight: Secondary | ICD-10-CM | POA: Diagnosis not present

## 2016-10-17 DIAGNOSIS — F419 Anxiety disorder, unspecified: Secondary | ICD-10-CM | POA: Diagnosis not present

## 2016-10-17 DIAGNOSIS — I739 Peripheral vascular disease, unspecified: Secondary | ICD-10-CM | POA: Diagnosis not present

## 2016-12-19 DIAGNOSIS — Z1389 Encounter for screening for other disorder: Secondary | ICD-10-CM | POA: Diagnosis not present

## 2016-12-19 DIAGNOSIS — Z79891 Long term (current) use of opiate analgesic: Secondary | ICD-10-CM | POA: Diagnosis not present

## 2016-12-19 DIAGNOSIS — Z6826 Body mass index (BMI) 26.0-26.9, adult: Secondary | ICD-10-CM | POA: Diagnosis not present

## 2016-12-19 DIAGNOSIS — J449 Chronic obstructive pulmonary disease, unspecified: Secondary | ICD-10-CM | POA: Diagnosis not present

## 2016-12-19 DIAGNOSIS — G894 Chronic pain syndrome: Secondary | ICD-10-CM | POA: Diagnosis not present

## 2016-12-19 DIAGNOSIS — F419 Anxiety disorder, unspecified: Secondary | ICD-10-CM | POA: Diagnosis not present

## 2017-01-18 DIAGNOSIS — E663 Overweight: Secondary | ICD-10-CM | POA: Diagnosis not present

## 2017-01-18 DIAGNOSIS — J449 Chronic obstructive pulmonary disease, unspecified: Secondary | ICD-10-CM | POA: Diagnosis not present

## 2017-01-18 DIAGNOSIS — Z6827 Body mass index (BMI) 27.0-27.9, adult: Secondary | ICD-10-CM | POA: Diagnosis not present

## 2017-01-18 DIAGNOSIS — R200 Anesthesia of skin: Secondary | ICD-10-CM | POA: Diagnosis not present

## 2017-01-29 IMAGING — CR DG CHEST 1V PORT
1 series · 1 of 1 positions shown · non-contrast
Comparison: September 10, 2014

CLINICAL DATA: Weakness and fatigue.

EXAM:
PORTABLE CHEST 1 VIEW

[ap portable]
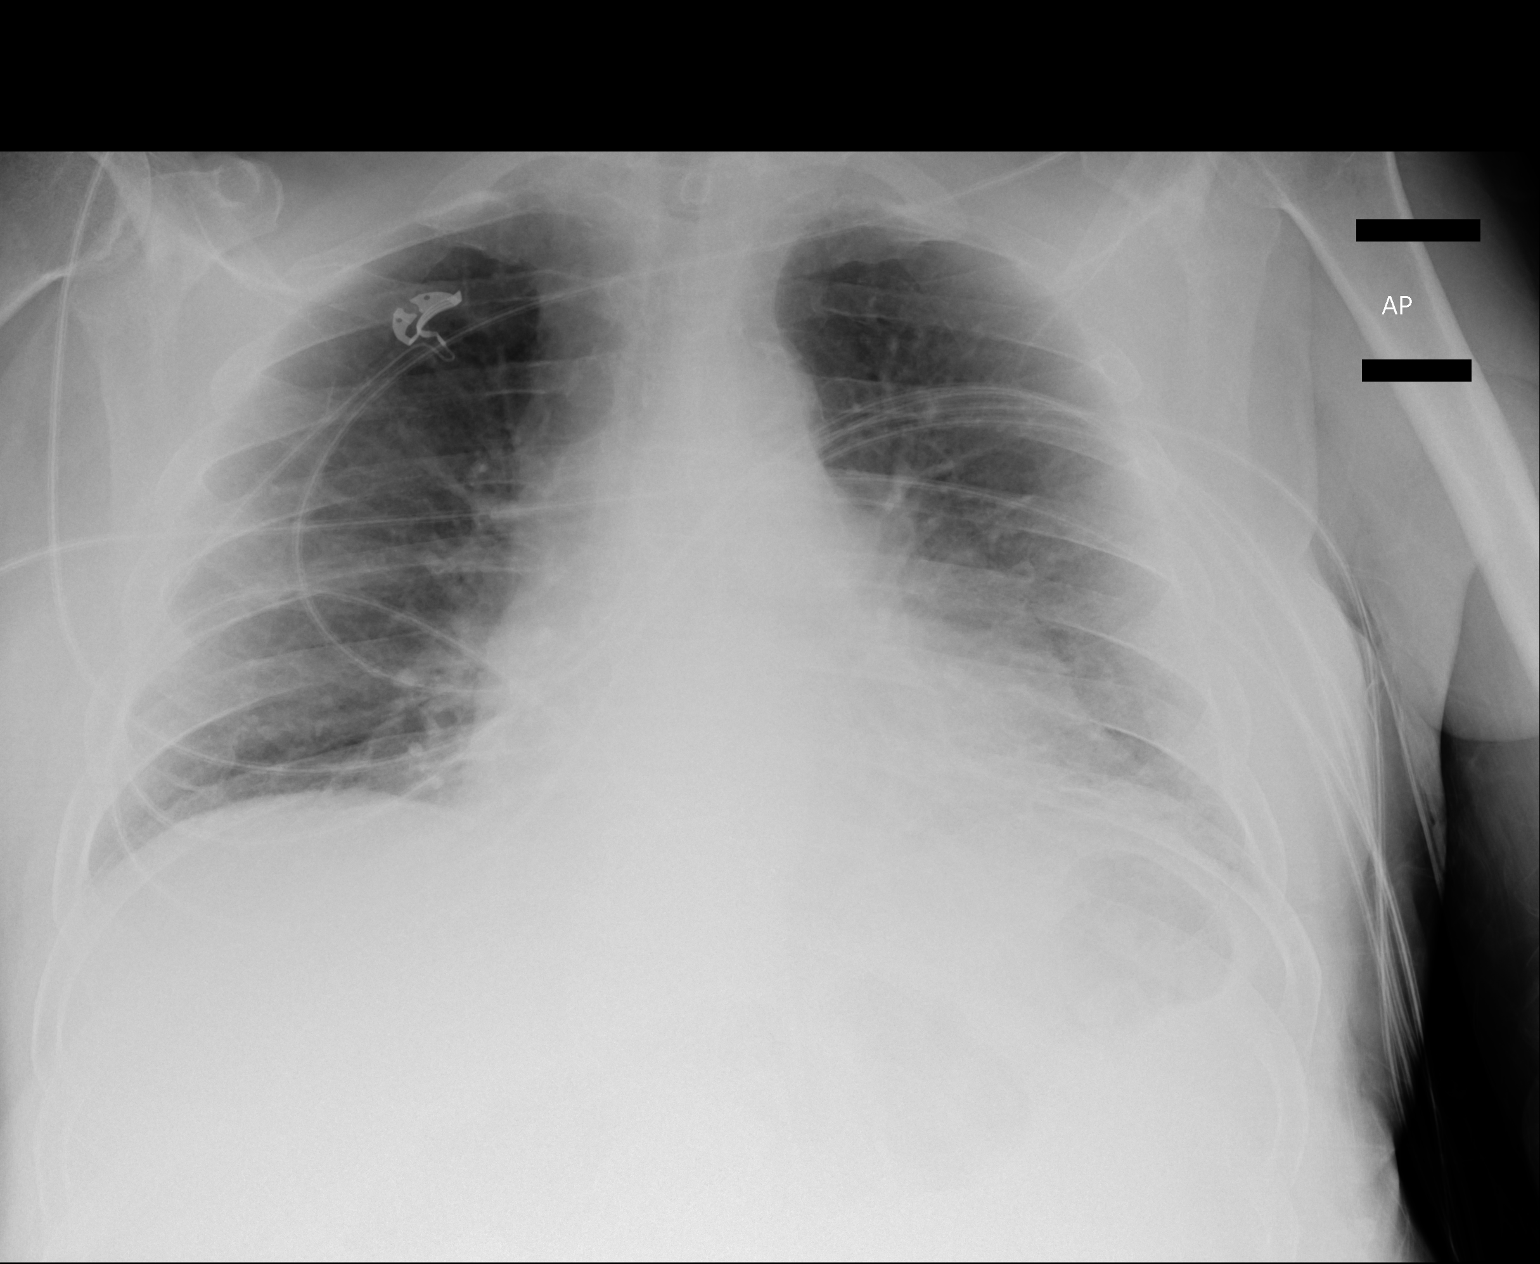

[1 of 1 positions shown; findings below may reference images not displayed]

FINDINGS: Stable cardiomegaly. Mild atelectasis in the left lung base. The
hila and mediastinum are normal. No pulmonary nodules or masses. No
other infiltrates.
IMPRESSION: Mild opacity in the left lung base is favored to represent
atelectasis. Mild cardiomegaly. No other changes.

## 2017-03-21 DIAGNOSIS — G894 Chronic pain syndrome: Secondary | ICD-10-CM | POA: Diagnosis not present

## 2017-03-21 DIAGNOSIS — E663 Overweight: Secondary | ICD-10-CM | POA: Diagnosis not present

## 2017-03-21 DIAGNOSIS — Z6826 Body mass index (BMI) 26.0-26.9, adult: Secondary | ICD-10-CM | POA: Diagnosis not present

## 2017-03-21 DIAGNOSIS — Z7689 Persons encountering health services in other specified circumstances: Secondary | ICD-10-CM | POA: Diagnosis not present

## 2017-03-21 DIAGNOSIS — F419 Anxiety disorder, unspecified: Secondary | ICD-10-CM | POA: Diagnosis not present

## 2017-06-21 DIAGNOSIS — Z7689 Persons encountering health services in other specified circumstances: Secondary | ICD-10-CM | POA: Diagnosis not present

## 2017-06-21 DIAGNOSIS — Z23 Encounter for immunization: Secondary | ICD-10-CM | POA: Diagnosis not present

## 2017-06-21 DIAGNOSIS — G894 Chronic pain syndrome: Secondary | ICD-10-CM | POA: Diagnosis not present

## 2017-08-29 DIAGNOSIS — Z0000 Encounter for general adult medical examination without abnormal findings: Secondary | ICD-10-CM | POA: Diagnosis not present

## 2017-08-29 DIAGNOSIS — Z1389 Encounter for screening for other disorder: Secondary | ICD-10-CM | POA: Diagnosis not present

## 2017-08-29 DIAGNOSIS — Z719 Counseling, unspecified: Secondary | ICD-10-CM | POA: Diagnosis not present

## 2017-08-29 DIAGNOSIS — G894 Chronic pain syndrome: Secondary | ICD-10-CM | POA: Diagnosis not present

## 2017-10-10 DIAGNOSIS — G894 Chronic pain syndrome: Secondary | ICD-10-CM | POA: Diagnosis not present

## 2017-10-10 DIAGNOSIS — G64 Other disorders of peripheral nervous system: Secondary | ICD-10-CM | POA: Diagnosis not present

## 2017-10-10 DIAGNOSIS — J449 Chronic obstructive pulmonary disease, unspecified: Secondary | ICD-10-CM | POA: Diagnosis not present

## 2017-10-22 IMAGING — DX DG CHEST 1V
1 series · 1 of 1 positions shown · non-contrast
Comparison: Chest x-ray of July 22, 2016

CLINICAL DATA: Altered mental status this morning. Cough since last
night. History of coronary artery disease, previous CVA, long-term
smoker, alcohol abuse with encephalopathy and withdrawal episodes.

EXAM:
CHEST 1 VIEW

[chest ap]
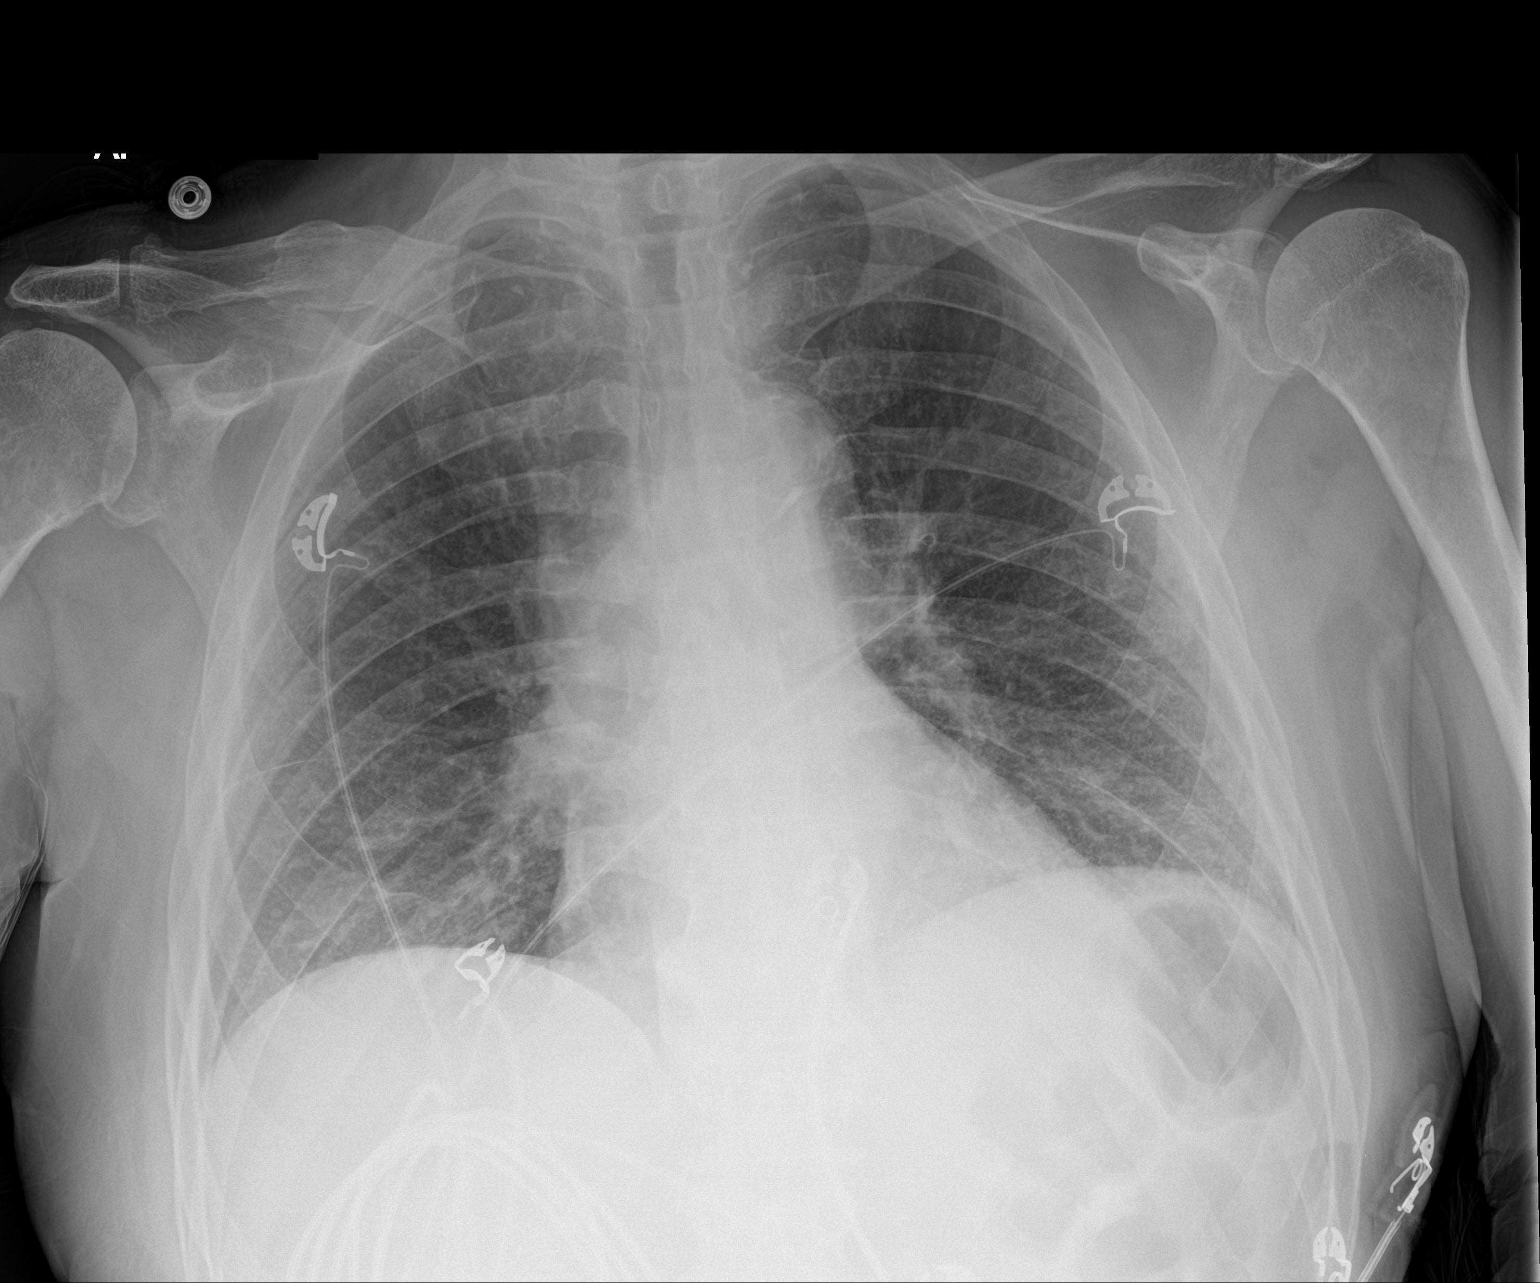

[1 of 1 positions shown; findings below may reference images not displayed]

FINDINGS: The lungs are adequately inflated. The interstitial markings are
coarse though stable. There is subtle increased density in the right
infrahilar region however that is slightly more conspicuous than in
the past. The heart is normal in size. The pulmonary vascularity is
not engorged. There is calcification in the wall of the aortic arch.
There is old deformity of the midshaft of the right clavicle.
IMPRESSION: Chronic interstitial prominence likely related to the patient's
smoking history. However, there is superimposed slightly increased
interstitial density at the right lung base which may reflect early
aspiration pneumonia in the appropriate clinical setting. There is
no CHF or alveolar pneumonia.

Thoracic aortic atherosclerosis.

## 2017-11-15 DIAGNOSIS — Z0000 Encounter for general adult medical examination without abnormal findings: Secondary | ICD-10-CM | POA: Diagnosis not present

## 2017-11-15 DIAGNOSIS — E782 Mixed hyperlipidemia: Secondary | ICD-10-CM | POA: Diagnosis not present

## 2017-11-15 DIAGNOSIS — Z1389 Encounter for screening for other disorder: Secondary | ICD-10-CM | POA: Diagnosis not present

## 2017-11-17 DIAGNOSIS — N183 Chronic kidney disease, stage 3 (moderate): Secondary | ICD-10-CM | POA: Diagnosis not present

## 2017-11-17 DIAGNOSIS — D649 Anemia, unspecified: Secondary | ICD-10-CM | POA: Diagnosis not present

## 2017-11-17 DIAGNOSIS — Z1389 Encounter for screening for other disorder: Secondary | ICD-10-CM | POA: Diagnosis not present

## 2017-11-21 DIAGNOSIS — R7309 Other abnormal glucose: Secondary | ICD-10-CM | POA: Diagnosis not present

## 2017-11-21 DIAGNOSIS — R739 Hyperglycemia, unspecified: Secondary | ICD-10-CM | POA: Diagnosis not present

## 2017-11-23 DIAGNOSIS — R600 Localized edema: Secondary | ICD-10-CM | POA: Diagnosis not present

## 2017-11-23 DIAGNOSIS — N183 Chronic kidney disease, stage 3 (moderate): Secondary | ICD-10-CM | POA: Diagnosis not present

## 2017-11-23 DIAGNOSIS — Z1389 Encounter for screening for other disorder: Secondary | ICD-10-CM | POA: Diagnosis not present

## 2017-11-23 DIAGNOSIS — D649 Anemia, unspecified: Secondary | ICD-10-CM | POA: Diagnosis not present

## 2017-11-23 DIAGNOSIS — G64 Other disorders of peripheral nervous system: Secondary | ICD-10-CM | POA: Diagnosis not present

## 2017-11-23 DIAGNOSIS — I739 Peripheral vascular disease, unspecified: Secondary | ICD-10-CM | POA: Diagnosis not present

## 2017-11-23 DIAGNOSIS — J449 Chronic obstructive pulmonary disease, unspecified: Secondary | ICD-10-CM | POA: Diagnosis not present

## 2017-11-28 ENCOUNTER — Encounter: Payer: Self-pay | Admitting: Cardiovascular Disease

## 2017-11-28 ENCOUNTER — Ambulatory Visit: Payer: Medicare Other | Admitting: Cardiovascular Disease

## 2017-11-28 VITALS — BP 118/78 | HR 75 | Ht 63.0 in | Wt 160.0 lb

## 2017-11-28 DIAGNOSIS — N189 Chronic kidney disease, unspecified: Secondary | ICD-10-CM | POA: Diagnosis not present

## 2017-11-28 DIAGNOSIS — I252 Old myocardial infarction: Secondary | ICD-10-CM | POA: Diagnosis not present

## 2017-11-28 DIAGNOSIS — Z95828 Presence of other vascular implants and grafts: Secondary | ICD-10-CM | POA: Diagnosis not present

## 2017-11-28 DIAGNOSIS — Z8673 Personal history of transient ischemic attack (TIA), and cerebral infarction without residual deficits: Secondary | ICD-10-CM | POA: Diagnosis not present

## 2017-11-28 DIAGNOSIS — I739 Peripheral vascular disease, unspecified: Secondary | ICD-10-CM | POA: Diagnosis not present

## 2017-11-28 DIAGNOSIS — I7090 Unspecified atherosclerosis: Secondary | ICD-10-CM

## 2017-11-28 DIAGNOSIS — E785 Hyperlipidemia, unspecified: Secondary | ICD-10-CM | POA: Diagnosis not present

## 2017-11-28 DIAGNOSIS — Z9889 Other specified postprocedural states: Secondary | ICD-10-CM | POA: Diagnosis not present

## 2017-11-28 DIAGNOSIS — I25118 Atherosclerotic heart disease of native coronary artery with other forms of angina pectoris: Secondary | ICD-10-CM

## 2017-11-28 DIAGNOSIS — I708 Atherosclerosis of other arteries: Secondary | ICD-10-CM | POA: Diagnosis not present

## 2017-11-28 DIAGNOSIS — Z720 Tobacco use: Secondary | ICD-10-CM | POA: Diagnosis not present

## 2017-11-28 NOTE — Progress Notes (Signed)
CARDIOLOGY CONSULT NOTE  Patient ID: George Valdez MRN: [id number] DOB/AGE: 10-07-1955 62 y.o.  Admit date: (Not on file) Primary Physician: Scherrie Bateman Referring Physician: Hiram Comber, *.    Reason for Consultation: Coronary artery disease  HPI: George Valdez is a 62 y.o. male who is being seen today for the evaluation of coronary artery disease at the request of Hiram Comber, *.  I reviewed extensive documentation in the electronic medical record as well as from his PCP.  He reportedly has a history of coronary artery disease and myocardial infarction.  Past medical history significant for alcohol abuse, previous stroke, and tobacco abuse.  I reviewed previous ECGs from January and February 2018 which have demonstrated sinus rhythm with nonspecific IVCD and left axis deviation.  ECG performed in our office today demonstrates sinus rhythm with left anterior fascicular block and LVH.  He denies exertional chest pain.  He also denies leg swelling, palpitations, and syncope.  He has chronic lightheadedness and dizziness as he has sustained 3 strokes.  He has some mild exertional dyspnea which is seldom.  He typically walks with a walker or cane.  He is to follow with Dr. Laverle Patter in [place], [place].  He tells me he has bilateral carotid artery stents and bilateral lower extremity stents.  His wife told me that he has a 45% blockage in the left circumflex coronary artery.  She tells me that his kidney function is gotten significantly worse.  I do not have a copy of his most recent basic metabolic panel nor his lipid panel.  It appears he is undergone cardiac catheterization in the past but did not require percutaneous coronary intervention.    Allergies  Allergen Reactions  . Codeine Itching    Current Outpatient Medications  Medication Sig Dispense Refill  . albuterol (PROVENTIL HFA;VENTOLIN HFA) 108 (90 BASE)  MCG/ACT inhaler Inhale 2 puffs into the lungs every 6 (six) hours as needed for wheezing or shortness of breath.    Marland Kitchen amoxicillin-clavulanate (AUGMENTIN) 875-125 MG tablet Take 1 tablet by mouth 2 (two) times daily. 10 tablet 0  . aspirin EC 81 MG tablet Take 81 mg by mouth daily. Reported on 11/16/2015    . chlordiazePOXIDE (LIBRIUM) 5 MG capsule Take 1 capsule (5 mg total) by mouth 2 (two) times daily as needed for anxiety. 30 capsule 0  . clopidogrel (PLAVIX) 75 MG tablet Take 75 mg by mouth daily.    . cyanocobalamin (,VITAMIN B-12,) 1000 MCG/ML injection Inject 1,000 mcg into the muscle every 30 (thirty) days.    . DULoxetine (CYMBALTA) 60 MG capsule Take 60 mg by mouth daily.    . folic acid (FOLVITE) 1 MG tablet Take 1 tablet (1 mg total) by mouth daily.    Marland Kitchen gabapentin (NEURONTIN) 800 MG tablet Take 800 mg by mouth 3 (three) times daily.    . isosorbide mononitrate (ISMO,MONOKET) 20 MG tablet Take 10 mg by mouth daily.    . metoprolol succinate (TOPROL-XL) 50 MG 24 hr tablet Take 50 mg by mouth 2 (two) times daily. Take with or immediately following a meal.    . Multiple Vitamin (MULTIVITAMIN WITH MINERALS) TABS tablet Take 1 tablet by mouth daily.    . potassium chloride 20 MEQ TBCR Take 20 mEq by mouth daily. 30 tablet 0  . rosuvastatin (CRESTOR) 40 MG tablet Take 40 mg by mouth daily.    Marland Kitchen thiamine 100 MG tablet Take  1 tablet (100 mg total) by mouth daily. 30 tablet 0  . tiotropium (SPIRIVA) 18 MCG inhalation capsule Place 18 mcg into inhaler and inhale daily.     . vitamin E 400 UNIT capsule Take 400 Units by mouth daily.     No current facility-administered medications for this visit.     Past Medical History:  Diagnosis Date  . Alcohol abuse   . Anxiety   . BPH (benign prostatic hyperplasia)   . Chronic pain   . Coronary artery disease   . Depression   . Frequent falls   . Hyperlipemia   . Hypertension   . Neuropathy   . Pernicious anemia   . Stroke [facility])    left sided  weakness    Past Surgical History:  Procedure Laterality Date  . CARDIAC CATHETERIZATION    . CAROTID ENDARTERECTOMY      Social History   Socioeconomic History  . Marital status: Married    Spouse name: Not on file  . Number of children: Not on file  . Years of education: Not on file  . Highest education level: Not on file  Occupational History  . Not on file  Social Needs  . Financial resource strain: Not on file  . Food insecurity:    Worry: Not on file    Inability: Not on file  . Transportation needs:    Medical: Not on file    Non-medical: Not on file  Tobacco Use  . Smoking status: Current Every Day Smoker    Packs/day: 1.00    Years: 40.00    Pack years: 40.00    Types: Cigarettes  . Smokeless tobacco: Never Used  . Tobacco comment: down to 5 cigarettes/day from 1.5 PPD  Substance and Sexual Activity  . Alcohol use: Yes    Alcohol/week: 7.2 oz    Types: 12 Standard drinks or equivalent per week  . Drug use: No  . Sexual activity: Not on file  Lifestyle  . Physical activity:    Days per week: Not on file    Minutes per session: Not on file  . Stress: Not on file  Relationships  . Social connections:    Talks on phone: Not on file    Gets together: Not on file    Attends religious service: Not on file    Active member of club or organization: Not on file    Attends meetings of clubs or organizations: Not on file    Relationship status: Not on file  . Intimate partner violence:    Fear of current or ex partner: Not on file    Emotionally abused: Not on file    Physically abused: Not on file    Forced sexual activity: Not on file  Other Topics Concern  . Not on file  Social History Narrative  . Not on file     No family history of premature CAD in 1st degree relatives.  Current Meds  Medication Sig  . albuterol (PROVENTIL HFA;VENTOLIN HFA) 108 (90 BASE) MCG/ACT inhaler Inhale 2 puffs into the lungs every 6 (six) hours as needed for wheezing or  shortness of breath.  Marland Kitchen amoxicillin-clavulanate (AUGMENTIN) 875-125 MG tablet Take 1 tablet by mouth 2 (two) times daily.  Marland Kitchen aspirin EC 81 MG tablet Take 81 mg by mouth daily. Reported on 11/16/2015  . chlordiazePOXIDE (LIBRIUM) 5 MG capsule Take 1 capsule (5 mg total) by mouth 2 (two) times daily as needed for anxiety.  Marland Kitchen  clopidogrel (PLAVIX) 75 MG tablet Take 75 mg by mouth daily.  . cyanocobalamin (,VITAMIN B-12,) 1000 MCG/ML injection Inject 1,000 mcg into the muscle every 30 (thirty) days.  . DULoxetine (CYMBALTA) 60 MG capsule Take 60 mg by mouth daily.  . folic acid (FOLVITE) 1 MG tablet Take 1 tablet (1 mg total) by mouth daily.  Marland Kitchen gabapentin (NEURONTIN) 800 MG tablet Take 800 mg by mouth 3 (three) times daily.  . isosorbide mononitrate (ISMO,MONOKET) 20 MG tablet Take 10 mg by mouth daily.  . metoprolol succinate (TOPROL-XL) 50 MG 24 hr tablet Take 50 mg by mouth 2 (two) times daily. Take with or immediately following a meal.  . Multiple Vitamin (MULTIVITAMIN WITH MINERALS) TABS tablet Take 1 tablet by mouth daily.  . potassium chloride 20 MEQ TBCR Take 20 mEq by mouth daily.  . rosuvastatin (CRESTOR) 40 MG tablet Take 40 mg by mouth daily.  Marland Kitchen thiamine 100 MG tablet Take 1 tablet (100 mg total) by mouth daily.  Marland Kitchen tiotropium (SPIRIVA) 18 MCG inhalation capsule Place 18 mcg into inhaler and inhale daily.   . vitamin E 400 UNIT capsule Take 400 Units by mouth daily.      Review of systems complete and found to be negative unless listed above in HPI    Physical exam Height '5\' 3"'  (1.6 m), weight 160 lb (72.6 kg). General: NAD Neck: No JVD, no thyromegaly or thyroid nodule.  Lungs: Clear to auscultation bilaterally with normal respiratory effort. CV: Nondisplaced PMI. Regular rate and rhythm, normal S1/S2, no S3/S4, no murmur.  No peripheral edema.  No carotid bruit.   Abdomen: Soft, nontender, no distention.  Skin: Intact without lesions or rashes.  Neurologic: Alert and oriented x  3.  Psych: Normal affect. Extremities: No clubbing or cyanosis.  HEENT: Normal.   ECG: Most recent ECG reviewed.   Labs: Lab Results  Component Value Date/Time   K 4.7 08/23/2016 04:58 AM   BUN 11 08/23/2016 04:58 AM   CREATININE 0.93 08/23/2016 04:58 AM   ALT 11 (L) 08/23/2016 04:58 AM   TSH 0.369 08/22/2016 06:01 PM   TSH 0.094 (L) 09/10/2014 03:09 PM   HGB 11.2 (L) 08/23/2016 04:58 AM     Lipids: No results found for: LDLCALC, LDLDIRECT, CHOL, TRIG, HDL      ASSESSMENT AND PLAN:  1.  Coronary disease with history of MI: To medically stable.  Currently on aspirin, metoprolol succinate 50 mg daily, rosuvastatin 40 mg, and Imdur (apparently 10 mg).  It appears he is undergone cardiac catheterization in the past but did not require percutaneous coronary intervention. He is apparently on Plavix for history of stroke. I will obtain an echocardiogram to evaluate cardiac structure and function. I will try to obtain all cardiac records including cardiac catheterization reports and stress test reports from his previous cardiologist, Dr. Laverle Patter in [place], [place].  2.  History of CVA: He has had 3 CVAs.  Currently on Plavix and statin therapy.  3.  Peripheral vascular disease with history of bilateral carotid artery stents and bilateral lower external any stents: He has some chronic neuropathy but denies claudication pain per se.  He is on aspirin and statin therapy.  I will make a referral to vascular surgery so that he will have routine monitoring.  4.  Tobacco abuse: He continues to smoke 1/2 pack of cigarettes daily.  5.  Chronic kidney disease: Unclear what stage he has.  I will obtain a copy of his most recent  basic met about panel from his PCP.  6.  Hyperlipidemia: I will obtain a copy of his most recent lipids from his PCP.  He is currently on rosuvastatin 40 mg.    Disposition: Follow up in 6 months   Signed: Kate Sable, M.D., F.A.C.C.  11/28/2017, 9:18  AM

## 2017-11-28 NOTE — Patient Instructions (Addendum)
Your physician wants you to follow-up in:6 months  with Dr.Koneswaran You will receive a reminder letter in the mail two months in advance. If you don't receive a letter, please call our office to schedule the follow-up appointment.   Your physician has requested that you have an echocardiogram. Echocardiography is a painless test that uses sound waves to create images of your heart. It provides your doctor with information about the size and shape of your heart and how well your heart's chambers and valves are working. This procedure takes approximately one hour. There are no restrictions for this procedure.   Your physician recommends that you continue on your current medications as directed. Please refer to the Current Medication list given to you today.   If you need a refill on your cardiac medications before your next appointment, please call your pharmacy.    You have been referred to [facility] [phone], they will call you for an apt   No lab work or testing ordered today    Thank you for choosing [facility] !

## 2017-12-04 ENCOUNTER — Ambulatory Visit (HOSPITAL_COMMUNITY)
Admission: RE | Admit: 2017-12-04 | Discharge: 2017-12-04 | Disposition: A | Discharge status: Home / Self Care | Payer: Medicare Other | Source: Ambulatory Visit | Attending: Cardiovascular Disease | Admitting: Cardiovascular Disease

## 2017-12-04 DIAGNOSIS — I10 Essential (primary) hypertension: Secondary | ICD-10-CM | POA: Insufficient documentation

## 2017-12-04 DIAGNOSIS — I2510 Atherosclerotic heart disease of native coronary artery without angina pectoris: Secondary | ICD-10-CM | POA: Diagnosis not present

## 2017-12-04 DIAGNOSIS — I739 Peripheral vascular disease, unspecified: Secondary | ICD-10-CM | POA: Insufficient documentation

## 2017-12-04 DIAGNOSIS — I69354 Hemiplegia and hemiparesis following cerebral infarction affecting left non-dominant side: Secondary | ICD-10-CM | POA: Diagnosis not present

## 2017-12-04 DIAGNOSIS — I252 Old myocardial infarction: Secondary | ICD-10-CM | POA: Insufficient documentation

## 2017-12-04 DIAGNOSIS — I071 Rheumatic tricuspid insufficiency: Secondary | ICD-10-CM | POA: Insufficient documentation

## 2017-12-04 NOTE — Progress Notes (Signed)
*  PRELIMINARY RESULTS* Echocardiogram 2D Echocardiogram has been performed.  George Valdez 12/04/2017, 12:15 PM

## 2017-12-11 ENCOUNTER — Telehealth: Payer: Self-pay | Admitting: Cardiovascular Disease

## 2017-12-11 NOTE — Telephone Encounter (Signed)
Patient informed. 

## 2017-12-11 NOTE — Telephone Encounter (Signed)
Result Notes for ECHOCARDIOGRAM COMPLETE   Notes recorded by Herminio Commons, MD on 12/05/2017 at 9:16 PM EDT Normal pumping function.

## 2017-12-11 NOTE — Telephone Encounter (Signed)
Called wanting test results

## 2017-12-22 ENCOUNTER — Encounter (HOSPITAL_COMMUNITY): Payer: Medicare Other

## 2017-12-22 ENCOUNTER — Encounter: Payer: Medicare Other | Admitting: Vascular Surgery

## 2017-12-22 DIAGNOSIS — R6889 Other general symptoms and signs: Secondary | ICD-10-CM | POA: Diagnosis not present

## 2017-12-24 DIAGNOSIS — R600 Localized edema: Secondary | ICD-10-CM | POA: Diagnosis not present

## 2017-12-24 DIAGNOSIS — I739 Peripheral vascular disease, unspecified: Secondary | ICD-10-CM | POA: Diagnosis not present

## 2017-12-24 DIAGNOSIS — J449 Chronic obstructive pulmonary disease, unspecified: Secondary | ICD-10-CM | POA: Diagnosis not present

## 2017-12-24 DIAGNOSIS — G64 Other disorders of peripheral nervous system: Secondary | ICD-10-CM | POA: Diagnosis not present

## 2017-12-26 DIAGNOSIS — J449 Chronic obstructive pulmonary disease, unspecified: Secondary | ICD-10-CM | POA: Diagnosis not present

## 2017-12-26 DIAGNOSIS — N183 Chronic kidney disease, stage 3 (moderate): Secondary | ICD-10-CM | POA: Diagnosis not present

## 2017-12-26 DIAGNOSIS — G894 Chronic pain syndrome: Secondary | ICD-10-CM | POA: Diagnosis not present

## 2017-12-26 DIAGNOSIS — G629 Polyneuropathy, unspecified: Secondary | ICD-10-CM | POA: Diagnosis not present

## 2018-01-17 ENCOUNTER — Other Ambulatory Visit: Payer: Self-pay

## 2018-01-17 DIAGNOSIS — I6529 Occlusion and stenosis of unspecified carotid artery: Secondary | ICD-10-CM

## 2018-01-17 DIAGNOSIS — I739 Peripheral vascular disease, unspecified: Secondary | ICD-10-CM

## 2018-01-23 DIAGNOSIS — J449 Chronic obstructive pulmonary disease, unspecified: Secondary | ICD-10-CM | POA: Diagnosis not present

## 2018-01-23 DIAGNOSIS — I739 Peripheral vascular disease, unspecified: Secondary | ICD-10-CM | POA: Diagnosis not present

## 2018-01-23 DIAGNOSIS — R600 Localized edema: Secondary | ICD-10-CM | POA: Diagnosis not present

## 2018-01-23 DIAGNOSIS — G64 Other disorders of peripheral nervous system: Secondary | ICD-10-CM | POA: Diagnosis not present

## 2018-02-07 ENCOUNTER — Ambulatory Visit (INDEPENDENT_AMBULATORY_CARE_PROVIDER_SITE_OTHER)
Admission: RE | Admit: 2018-02-07 | Discharge: 2018-02-07 | Disposition: A | Discharge status: Home / Self Care | Payer: Medicare Other | Source: Ambulatory Visit | Attending: Family | Admitting: Family

## 2018-02-07 ENCOUNTER — Ambulatory Visit (HOSPITAL_COMMUNITY)
Admission: RE | Admit: 2018-02-07 | Discharge: 2018-02-07 | Disposition: A | Discharge status: Home / Self Care | Payer: Medicare Other | Source: Ambulatory Visit | Attending: Vascular Surgery | Admitting: Vascular Surgery

## 2018-02-07 DIAGNOSIS — I2510 Atherosclerotic heart disease of native coronary artery without angina pectoris: Secondary | ICD-10-CM | POA: Insufficient documentation

## 2018-02-07 DIAGNOSIS — I10 Essential (primary) hypertension: Secondary | ICD-10-CM | POA: Insufficient documentation

## 2018-02-07 DIAGNOSIS — I739 Peripheral vascular disease, unspecified: Secondary | ICD-10-CM | POA: Diagnosis not present

## 2018-02-07 DIAGNOSIS — I70203 Unspecified atherosclerosis of native arteries of extremities, bilateral legs: Secondary | ICD-10-CM | POA: Diagnosis not present

## 2018-02-07 DIAGNOSIS — F17200 Nicotine dependence, unspecified, uncomplicated: Secondary | ICD-10-CM | POA: Diagnosis not present

## 2018-02-07 DIAGNOSIS — R6889 Other general symptoms and signs: Secondary | ICD-10-CM | POA: Diagnosis not present

## 2018-02-07 DIAGNOSIS — I6529 Occlusion and stenosis of unspecified carotid artery: Secondary | ICD-10-CM | POA: Insufficient documentation

## 2018-02-07 DIAGNOSIS — Z8673 Personal history of transient ischemic attack (TIA), and cerebral infarction without residual deficits: Secondary | ICD-10-CM | POA: Insufficient documentation

## 2018-02-14 ENCOUNTER — Encounter: Payer: Self-pay | Admitting: Vascular Surgery

## 2018-02-14 ENCOUNTER — Other Ambulatory Visit: Payer: Self-pay

## 2018-02-14 ENCOUNTER — Ambulatory Visit (INDEPENDENT_AMBULATORY_CARE_PROVIDER_SITE_OTHER): Payer: Medicare Other | Admitting: Vascular Surgery

## 2018-02-14 VITALS — BP 91/62 | HR 65 | Temp 96.8°F | Resp 18 | Ht 63.0 in | Wt 169.0 lb

## 2018-02-14 DIAGNOSIS — I6523 Occlusion and stenosis of bilateral carotid arteries: Secondary | ICD-10-CM

## 2018-02-14 DIAGNOSIS — R6889 Other general symptoms and signs: Secondary | ICD-10-CM | POA: Diagnosis not present

## 2018-02-14 DIAGNOSIS — I739 Peripheral vascular disease, unspecified: Secondary | ICD-10-CM

## 2018-02-14 NOTE — Progress Notes (Signed)
REASON FOR CONSULT:    Previous carotid and lower extremity stents.  The consult is requested by Dr. Edson Snowball.  HPI:   George Valdez is a pleasant 62 y.o. male, who presents for establish vascular care.  He tells me that he had bilateral carotid stents and is also had stents in his legs elsewhere.  I do not have these records.   With respect to his carotids he tells me that he had 2 strokes in the 1990s and a third stroke in 2013.  These were left brain strokes associated with right-sided weakness.  He has had no recent stroke, TIAs, expressive or receptive aphasia, or amaurosis fugax.  He is on aspirin, Plavix, and a statin.  With respect to his peripheral vascular disease he does describe some bilateral calf claudication at a short distance.  His activity is fairly limited.  I do not get any history of thigh or hip claudication.  His symptoms are brought on by ambulation and relieved with rest.  I do not get any history of rest pain or nonhealing ulcers.  He does complain of some shooting pain in his feet and also some twitching in both legs.  Past Medical History:  Diagnosis Date  . Alcohol abuse   . Anxiety   . BPH (benign prostatic hyperplasia)   . Chronic pain   . Coronary artery disease   . Depression   . Frequent falls   . Hyperlipemia   . Hypertension   . Neuropathy   . Pernicious anemia   . Stroke [facility])    left sided weakness    Family History  Problem Relation Age of Onset  . Congestive Heart Failure Mother 8  . Heart attack Father 17  . Congestive Heart Failure Sister     SOCIAL HISTORY: Social History   Socioeconomic History  . Marital status: Married    Spouse name: Not on file  . Number of children: Not on file  . Years of education: Not on file  . Highest education level: Not on file  Occupational History  . Not on file  Social Needs  . Financial resource strain: Not on file  . Food insecurity:    Worry: Not on file    Inability:  Not on file  . Transportation needs:    Medical: Not on file    Non-medical: Not on file  Tobacco Use  . Smoking status: Current Every Day Smoker    Packs/day: 1.00    Years: 40.00    Pack years: 40.00    Types: Cigarettes  . Smokeless tobacco: Never Used  . Tobacco comment: down to 5 cigarettes/day from 1.5 PPD  Substance and Sexual Activity  . Alcohol use: Yes    Alcohol/week: 12.0 standard drinks    Types: 12 Standard drinks or equivalent per week  . Drug use: No  . Sexual activity: Not on file  Lifestyle  . Physical activity:    Days per week: Not on file    Minutes per session: Not on file  . Stress: Not on file  Relationships  . Social connections:    Talks on phone: Not on file    Gets together: Not on file    Attends religious service: Not on file    Active member of club or organization: Not on file    Attends meetings of clubs or organizations: Not on file    Relationship status: Not on file  . Intimate partner violence:  Fear of current or ex partner: Not on file    Emotionally abused: Not on file    Physically abused: Not on file    Forced sexual activity: Not on file  Other Topics Concern  . Not on file  Social History Narrative  . Not on file    Allergies  Allergen Reactions  . Codeine Itching    Current Outpatient Medications  Medication Sig Dispense Refill  . albuterol (PROVENTIL HFA;VENTOLIN HFA) 108 (90 BASE) MCG/ACT inhaler Inhale 2 puffs into the lungs every 6 (six) hours as needed for wheezing or shortness of breath.    Marland Kitchen aspirin EC 81 MG tablet Take 81 mg by mouth daily. Reported on 11/16/2015    . chlordiazePOXIDE (LIBRIUM) 5 MG capsule Take 1 capsule (5 mg total) by mouth 2 (two) times daily as needed for anxiety. 30 capsule 0  . clopidogrel (PLAVIX) 75 MG tablet Take 75 mg by mouth daily.    . cyanocobalamin (,VITAMIN B-12,) 1000 MCG/ML injection Inject 1,000 mcg into the muscle every 30 (thirty) days.    . DULoxetine (CYMBALTA) 60 MG  capsule Take 60 mg by mouth daily.    . folic acid (FOLVITE) 1 MG tablet Take 1 tablet (1 mg total) by mouth daily.    Marland Kitchen gabapentin (NEURONTIN) 800 MG tablet Take 800 mg by mouth 3 (three) times daily.    Marland Kitchen HYDROcodone-acetaminophen (NORCO/VICODIN) 5-325 MG tablet Take 1 tablet by mouth every 6 (six) hours as needed for moderate pain.    . isosorbide mononitrate (ISMO,MONOKET) 20 MG tablet Take 10 mg by mouth daily.    . metoprolol succinate (TOPROL-XL) 50 MG 24 hr tablet Take 50 mg by mouth 2 (two) times daily. Take with or immediately following a meal.    . Multiple Vitamin (MULTIVITAMIN WITH MINERALS) TABS tablet Take 1 tablet by mouth daily.    . potassium chloride 20 MEQ TBCR Take 20 mEq by mouth daily. 30 tablet 0  . rosuvastatin (CRESTOR) 40 MG tablet Take 40 mg by mouth daily.    Marland Kitchen thiamine 100 MG tablet Take 1 tablet (100 mg total) by mouth daily. 30 tablet 0  . tiotropium (SPIRIVA) 18 MCG inhalation capsule Place 18 mcg into inhaler and inhale daily.     . vitamin E 400 UNIT capsule Take 400 Units by mouth daily.    Marland Kitchen amoxicillin-clavulanate (AUGMENTIN) 875-125 MG tablet Take 1 tablet by mouth 2 (two) times daily. (Patient not taking: Reported on 02/14/2018) 10 tablet 0  . traMADol (ULTRAM) 50 MG tablet Take 50 mg by mouth every 6 (six) hours as needed.     No current facility-administered medications for this visit.     REVIEW OF SYSTEMS:  [X]  denotes positive finding, [ ]  denotes negative finding Cardiac  Comments:  Chest pain or chest pressure:    Shortness of breath upon exertion: x   Short of breath when lying flat: x   Irregular heart rhythm:        Vascular    Pain in calf, thigh, or hip brought on by ambulation: x   Pain in feet at night that wakes you up from your sleep:  x   Blood clot in your veins:    Leg swelling:  x       Pulmonary    Oxygen at home:    Productive cough:     Wheezing:  x       Neurologic    Sudden weakness in arms or legs:  x   Sudden  numbness in arms or legs:  x   Sudden onset of difficulty speaking or slurred speech:    Temporary loss of vision in one eye:     Problems with dizziness:  x       Gastrointestinal    Blood in stool:     Vomited blood:         Genitourinary    Burning when urinating:  x   Blood in urine:        Psychiatric    Major depression:         Hematologic    Bleeding problems:    Problems with blood clotting too easily:        Skin    Rashes or ulcers:        Constitutional    Fever or chills:     PHYSICAL EXAM:   Vitals:   02/14/18 0901 02/14/18 0907  BP: 104/65 91/62  Pulse: 65 65  Resp: 18   Temp: (!) 96.8 F (36 C)   TempSrc: Oral   SpO2: 99%   Weight: 169 lb (76.7 kg)   Height: 5\' 3"  (1.6 m)     GENERAL: The patient is a well-nourished male, in no acute distress. The vital signs are documented above. CARDIAC: There is a regular rate and rhythm.  VASCULAR: I do not detect carotid bruits. He has diminished but palpable femoral pulses bilaterally.  I cannot palpate popliteal or pedal pulses. He has no significant lower extremity swelling. PULMONARY: There is good air exchange bilaterally without wheezing or rales. ABDOMEN: Soft and non-tender with normal pitched bowel sounds.  MUSCULOSKELETAL: There are no major deformities or cyanosis. NEUROLOGIC: No focal weakness or paresthesias are detected. SKIN: There are no ulcers or rashes noted. PSYCHIATRIC: The patient has a normal affect.  DATA:    LOWER EXTREMITY ARTERIAL DUPLEX: I reviewed the lower extremity arterial duplex that was done on 02/07/2018.    On the right side there is a 30 to 49% stenosis in the superficial femoral artery.  There is a subsequent 50 to 74% stenosis further downstream.  There is monophasic flow from the proximal popliteal artery distally.  The posterior tibial signal cannot be identified.  On the left side there is a 50 to 74% stenosis in the deep femoral artery and a 50 to 74% stenosis in  the superficial femoral artery.  There is biphasic signals throughout on the left although the posterior tibial artery does not have a signal.  CAROTID DUPLEX: I reviewed the carotid duplex scan from 02/07/2018.  On the right side there is a less than 39% stenosis.  On the left side there is a 40 to 59% stenosis.  ASSESSMENT & PLAN:   BILATERAL CAROTID DISEASE: He tells me that he had bilateral carotid stents.  I do not have these records.  His most recent carotid duplex scan last month showed no significant recurrent stenosis on the right.  He had a 40 to 59% left carotid stenosis.  He is asymptomatic.  I recommended a follow-up duplex scan in 1 year and I will see him back at that time.  He knows to call sooner if he has problems.  He is on aspirin and is on a statin.  PERIPHERAL VASCULAR DISEASE: He has stable bilateral calf claudication.  He has not had recent ABIs but his duplex scan suggest infrainguinal arterial occlusive disease bilaterally.  We have discussed the importance of tobacco cessation.  I  have encouraged him to stay as active as possible.  We have also discussed the importance of nutrition.  I ordered follow-up ABIs in 1 year and I will see him back at that time.  He understands we would consider a more aggressive work-up if he developed disabling claudication, rest pain, or a nonhealing ulcer.   Deitra Mayo [facility] [phone]

## 2018-02-23 DIAGNOSIS — I739 Peripheral vascular disease, unspecified: Secondary | ICD-10-CM | POA: Diagnosis not present

## 2018-02-23 DIAGNOSIS — R600 Localized edema: Secondary | ICD-10-CM | POA: Diagnosis not present

## 2018-02-23 DIAGNOSIS — G64 Other disorders of peripheral nervous system: Secondary | ICD-10-CM | POA: Diagnosis not present

## 2018-02-23 DIAGNOSIS — J449 Chronic obstructive pulmonary disease, unspecified: Secondary | ICD-10-CM | POA: Diagnosis not present

## 2018-03-12 DIAGNOSIS — G894 Chronic pain syndrome: Secondary | ICD-10-CM | POA: Diagnosis not present

## 2018-03-12 DIAGNOSIS — G629 Polyneuropathy, unspecified: Secondary | ICD-10-CM | POA: Diagnosis not present

## 2018-03-12 DIAGNOSIS — R6889 Other general symptoms and signs: Secondary | ICD-10-CM | POA: Diagnosis not present

## 2018-03-26 DIAGNOSIS — R600 Localized edema: Secondary | ICD-10-CM | POA: Diagnosis not present

## 2018-03-26 DIAGNOSIS — G64 Other disorders of peripheral nervous system: Secondary | ICD-10-CM | POA: Diagnosis not present

## 2018-03-26 DIAGNOSIS — I739 Peripheral vascular disease, unspecified: Secondary | ICD-10-CM | POA: Diagnosis not present

## 2018-03-26 DIAGNOSIS — J449 Chronic obstructive pulmonary disease, unspecified: Secondary | ICD-10-CM | POA: Diagnosis not present

## 2018-04-25 DIAGNOSIS — I739 Peripheral vascular disease, unspecified: Secondary | ICD-10-CM | POA: Diagnosis not present

## 2018-04-25 DIAGNOSIS — R600 Localized edema: Secondary | ICD-10-CM | POA: Diagnosis not present

## 2018-04-25 DIAGNOSIS — G64 Other disorders of peripheral nervous system: Secondary | ICD-10-CM | POA: Diagnosis not present

## 2018-04-25 DIAGNOSIS — J449 Chronic obstructive pulmonary disease, unspecified: Secondary | ICD-10-CM | POA: Diagnosis not present

## 2018-05-26 DIAGNOSIS — G64 Other disorders of peripheral nervous system: Secondary | ICD-10-CM | POA: Diagnosis not present

## 2018-05-26 DIAGNOSIS — R600 Localized edema: Secondary | ICD-10-CM | POA: Diagnosis not present

## 2018-05-26 DIAGNOSIS — I739 Peripheral vascular disease, unspecified: Secondary | ICD-10-CM | POA: Diagnosis not present

## 2018-05-26 DIAGNOSIS — J449 Chronic obstructive pulmonary disease, unspecified: Secondary | ICD-10-CM | POA: Diagnosis not present

## 2018-07-23 DIAGNOSIS — E782 Mixed hyperlipidemia: Secondary | ICD-10-CM | POA: Diagnosis not present

## 2018-07-23 DIAGNOSIS — Z0001 Encounter for general adult medical examination with abnormal findings: Secondary | ICD-10-CM | POA: Diagnosis not present

## 2018-07-23 DIAGNOSIS — I2510 Atherosclerotic heart disease of native coronary artery without angina pectoris: Secondary | ICD-10-CM | POA: Diagnosis not present

## 2018-07-24 DIAGNOSIS — I10 Essential (primary) hypertension: Secondary | ICD-10-CM | POA: Diagnosis not present

## 2018-07-24 DIAGNOSIS — N183 Chronic kidney disease, stage 3 (moderate): Secondary | ICD-10-CM | POA: Diagnosis not present

## 2018-07-24 DIAGNOSIS — J449 Chronic obstructive pulmonary disease, unspecified: Secondary | ICD-10-CM | POA: Diagnosis not present

## 2018-07-24 DIAGNOSIS — Z0001 Encounter for general adult medical examination with abnormal findings: Secondary | ICD-10-CM | POA: Diagnosis not present

## 2018-07-24 DIAGNOSIS — Z1389 Encounter for screening for other disorder: Secondary | ICD-10-CM | POA: Diagnosis not present

## 2018-08-13 DIAGNOSIS — E7849 Other hyperlipidemia: Secondary | ICD-10-CM | POA: Diagnosis not present

## 2018-11-20 DIAGNOSIS — J449 Chronic obstructive pulmonary disease, unspecified: Secondary | ICD-10-CM | POA: Diagnosis not present

## 2018-11-20 DIAGNOSIS — I639 Cerebral infarction, unspecified: Secondary | ICD-10-CM | POA: Diagnosis not present

## 2018-11-20 DIAGNOSIS — I2510 Atherosclerotic heart disease of native coronary artery without angina pectoris: Secondary | ICD-10-CM | POA: Diagnosis not present

## 2019-02-20 DIAGNOSIS — I2510 Atherosclerotic heart disease of native coronary artery without angina pectoris: Secondary | ICD-10-CM | POA: Diagnosis not present

## 2019-02-20 DIAGNOSIS — I10 Essential (primary) hypertension: Secondary | ICD-10-CM | POA: Diagnosis not present

## 2019-02-20 DIAGNOSIS — G629 Polyneuropathy, unspecified: Secondary | ICD-10-CM | POA: Diagnosis not present

## 2019-02-20 DIAGNOSIS — E7849 Other hyperlipidemia: Secondary | ICD-10-CM | POA: Diagnosis not present

## 2019-02-20 DIAGNOSIS — I639 Cerebral infarction, unspecified: Secondary | ICD-10-CM | POA: Diagnosis not present

## 2019-02-28 DIAGNOSIS — E7849 Other hyperlipidemia: Secondary | ICD-10-CM | POA: Diagnosis not present

## 2019-02-28 DIAGNOSIS — N189 Chronic kidney disease, unspecified: Secondary | ICD-10-CM | POA: Diagnosis not present

## 2019-02-28 DIAGNOSIS — I10 Essential (primary) hypertension: Secondary | ICD-10-CM | POA: Diagnosis not present

## 2019-02-28 DIAGNOSIS — E785 Hyperlipidemia, unspecified: Secondary | ICD-10-CM | POA: Diagnosis not present

## 2019-02-28 DIAGNOSIS — J449 Chronic obstructive pulmonary disease, unspecified: Secondary | ICD-10-CM | POA: Diagnosis not present

## 2019-02-28 DIAGNOSIS — D649 Anemia, unspecified: Secondary | ICD-10-CM | POA: Diagnosis not present

## 2019-02-28 DIAGNOSIS — I2510 Atherosclerotic heart disease of native coronary artery without angina pectoris: Secondary | ICD-10-CM | POA: Diagnosis not present

## 2019-03-27 DIAGNOSIS — I2510 Atherosclerotic heart disease of native coronary artery without angina pectoris: Secondary | ICD-10-CM | POA: Diagnosis not present

## 2019-03-27 DIAGNOSIS — I10 Essential (primary) hypertension: Secondary | ICD-10-CM | POA: Diagnosis not present

## 2019-03-27 DIAGNOSIS — E063 Autoimmune thyroiditis: Secondary | ICD-10-CM | POA: Diagnosis not present

## 2019-03-27 DIAGNOSIS — E7849 Other hyperlipidemia: Secondary | ICD-10-CM | POA: Diagnosis not present

## 2019-04-27 DIAGNOSIS — E7849 Other hyperlipidemia: Secondary | ICD-10-CM | POA: Diagnosis not present

## 2019-04-27 DIAGNOSIS — J449 Chronic obstructive pulmonary disease, unspecified: Secondary | ICD-10-CM | POA: Diagnosis not present

## 2019-04-27 DIAGNOSIS — Z719 Counseling, unspecified: Secondary | ICD-10-CM | POA: Diagnosis not present

## 2019-04-27 DIAGNOSIS — I129 Hypertensive chronic kidney disease with stage 1 through stage 4 chronic kidney disease, or unspecified chronic kidney disease: Secondary | ICD-10-CM | POA: Diagnosis not present

## 2019-05-27 DIAGNOSIS — Z23 Encounter for immunization: Secondary | ICD-10-CM | POA: Diagnosis not present

## 2019-06-27 DIAGNOSIS — J449 Chronic obstructive pulmonary disease, unspecified: Secondary | ICD-10-CM | POA: Diagnosis not present

## 2019-06-27 DIAGNOSIS — I129 Hypertensive chronic kidney disease with stage 1 through stage 4 chronic kidney disease, or unspecified chronic kidney disease: Secondary | ICD-10-CM | POA: Diagnosis not present

## 2019-06-27 DIAGNOSIS — I2510 Atherosclerotic heart disease of native coronary artery without angina pectoris: Secondary | ICD-10-CM | POA: Diagnosis not present

## 2019-06-27 DIAGNOSIS — Z720 Tobacco use: Secondary | ICD-10-CM | POA: Diagnosis not present

## 2019-07-11 DIAGNOSIS — R6889 Other general symptoms and signs: Secondary | ICD-10-CM | POA: Diagnosis not present

## 2019-07-11 DIAGNOSIS — I129 Hypertensive chronic kidney disease with stage 1 through stage 4 chronic kidney disease, or unspecified chronic kidney disease: Secondary | ICD-10-CM | POA: Diagnosis not present

## 2019-07-11 DIAGNOSIS — I2510 Atherosclerotic heart disease of native coronary artery without angina pectoris: Secondary | ICD-10-CM | POA: Diagnosis not present

## 2019-07-11 DIAGNOSIS — E785 Hyperlipidemia, unspecified: Secondary | ICD-10-CM | POA: Diagnosis not present

## 2019-07-11 DIAGNOSIS — R809 Proteinuria, unspecified: Secondary | ICD-10-CM | POA: Diagnosis not present

## 2019-07-11 DIAGNOSIS — N1832 Chronic kidney disease, stage 3b: Secondary | ICD-10-CM | POA: Diagnosis not present

## 2019-07-18 ENCOUNTER — Telehealth: Payer: Self-pay | Admitting: Cardiovascular Disease

## 2019-07-18 NOTE — Telephone Encounter (Signed)

## 2019-07-19 ENCOUNTER — Other Ambulatory Visit: Payer: Self-pay | Admitting: Nephrology

## 2019-07-19 DIAGNOSIS — R809 Proteinuria, unspecified: Secondary | ICD-10-CM

## 2019-07-19 DIAGNOSIS — N1832 Chronic kidney disease, stage 3b: Secondary | ICD-10-CM

## 2019-07-22 ENCOUNTER — Telehealth (INDEPENDENT_AMBULATORY_CARE_PROVIDER_SITE_OTHER): Payer: Medicare Other | Admitting: Cardiovascular Disease

## 2019-07-22 ENCOUNTER — Encounter: Payer: Self-pay | Admitting: Cardiovascular Disease

## 2019-07-22 VITALS — BP 138/74 | Ht 63.0 in | Wt 176.0 lb

## 2019-07-22 DIAGNOSIS — Z95828 Presence of other vascular implants and grafts: Secondary | ICD-10-CM

## 2019-07-22 DIAGNOSIS — N189 Chronic kidney disease, unspecified: Secondary | ICD-10-CM

## 2019-07-22 DIAGNOSIS — I739 Peripheral vascular disease, unspecified: Secondary | ICD-10-CM

## 2019-07-22 DIAGNOSIS — I252 Old myocardial infarction: Secondary | ICD-10-CM | POA: Diagnosis not present

## 2019-07-22 DIAGNOSIS — Z720 Tobacco use: Secondary | ICD-10-CM

## 2019-07-22 DIAGNOSIS — I10 Essential (primary) hypertension: Secondary | ICD-10-CM

## 2019-07-22 DIAGNOSIS — I25118 Atherosclerotic heart disease of native coronary artery with other forms of angina pectoris: Secondary | ICD-10-CM | POA: Diagnosis not present

## 2019-07-22 DIAGNOSIS — Z8673 Personal history of transient ischemic attack (TIA), and cerebral infarction without residual deficits: Secondary | ICD-10-CM

## 2019-07-22 DIAGNOSIS — I6529 Occlusion and stenosis of unspecified carotid artery: Secondary | ICD-10-CM | POA: Diagnosis not present

## 2019-07-22 DIAGNOSIS — E785 Hyperlipidemia, unspecified: Secondary | ICD-10-CM

## 2019-07-22 DIAGNOSIS — Z9889 Other specified postprocedural states: Secondary | ICD-10-CM

## 2019-07-22 NOTE — Patient Instructions (Signed)
Medication Instructions:  Your physician recommends that you continue on your current medications as directed. Please refer to the Current Medication list given to you today.  *If you need a refill on your cardiac medications before your next appointment, please call your pharmacy*  Lab Work: NONE If you have labs (blood work) drawn today and your tests are completely normal, you will receive your results only by: Marland Kitchen MyChart Message (if you have MyChart) OR . A paper copy in the mail If you have any lab test that is abnormal or we need to change your treatment, we will call you to review the results.  Testing/Procedures: NONE  Follow-Up: At [facility], you and your health needs are our priority.  As part of our continuing mission to provide you with exceptional heart care, we have created designated Provider Care Teams.  These Care Teams include your primary Cardiologist (physician) and Advanced Practice Providers (APPs -  Physician Assistants and Nurse Practitioners) who all work together to provide you with the care you need, when you need it.  Your next appointment:   12 month(s)  The format for your next appointment:   In Person  Provider:   Bernerd Pho, PA-C, or Ermalinda Barrios, PA-C  Other Instructions NONE    Thank you for choosing [facility] !

## 2019-07-22 NOTE — Progress Notes (Signed)
Virtual Visit via Telephone Note   This visit type was conducted due to national recommendations for restrictions regarding the COVID-19 Pandemic (e.g. social distancing) in an effort to limit this patient's exposure and mitigate transmission in our community.  Due to his co-morbid illnesses, this patient is at least at moderate risk for complications without adequate follow up.  This format is felt to be most appropriate for this patient at this time.  The patient did not have access to video technology/had technical difficulties with video requiring transitioning to audio format only (telephone).  All issues noted in this document were discussed and addressed.  No physical exam could be performed with this format.  Please refer to the patient's chart for his  consent to telehealth for [facility]   Date:  07/22/2019   ID:  George Valdez, DOB 11-12-1955, MRN [id number]  Patient Location: Home Provider Location: Home  PCP:  Jake Samples, PA-C  Cardiologist:  Kate Sable, MD  Electrophysiologist:  None   Evaluation Performed:  Follow-Up Visit  Chief Complaint:  CAD  History of Present Illness:    George Valdez is a 64 y.o. male with a history of coronary artery disease and myocardial infarction, alcohol abuse, stroke, and tobacco abuse. He used to follow with Dr. Laverle Patter in [place], [place].  He previously told me he has bilateral carotid artery stents and bilateral lower extremity stents. His wife previously told me that he has a 45% blockage in the left circumflex coronary artery. It appears he is undergone cardiac catheterization in the past but did not require percutaneous coronary intervention.  He has chronic lightheadedness and dizziness as he has sustained 3 strokes.   He denies chest pain, leg swelling, and palpitations. He has chronic leg pain. He also has history of neuropathy.   Past Medical History:  Diagnosis Date  . Alcohol abuse    . Anxiety   . BPH (benign prostatic hyperplasia)   . Chronic pain   . Coronary artery disease   . Depression   . Frequent falls   . Hyperlipemia   . Hypertension   . Neuropathy   . Pernicious anemia   . Stroke [facility])    left sided weakness   Past Surgical History:  Procedure Laterality Date  . CARDIAC CATHETERIZATION    . CAROTID ENDARTERECTOMY       Current Meds  Medication Sig  . albuterol (PROVENTIL HFA;VENTOLIN HFA) 108 (90 BASE) MCG/ACT inhaler Inhale 2 puffs into the lungs every 6 (six) hours as needed for wheezing or shortness of breath.  Marland Kitchen aspirin EC 81 MG tablet Take 81 mg by mouth daily. Reported on 11/16/2015  . chlordiazePOXIDE (LIBRIUM) 5 MG capsule Take 1 capsule (5 mg total) by mouth 2 (two) times daily as needed for anxiety.  . clopidogrel (PLAVIX) 75 MG tablet Take 75 mg by mouth daily.  . cyanocobalamin (,VITAMIN B-12,) 1000 MCG/ML injection Inject 1,000 mcg into the muscle every 30 (thirty) days.  . DULoxetine (CYMBALTA) 60 MG capsule Take 60 mg by mouth daily.  . folic acid (FOLVITE) 1 MG tablet Take 1 tablet (1 mg total) by mouth daily.  Marland Kitchen gabapentin (NEURONTIN) 800 MG tablet Take 800 mg by mouth 3 (three) times daily.  Marland Kitchen HYDROcodone-acetaminophen (NORCO/VICODIN) 5-325 MG tablet Take 1 tablet by mouth every 6 (six) hours as needed for moderate pain.  . isosorbide mononitrate (ISMO,MONOKET) 20 MG tablet Take 10 mg by mouth daily.  . metoprolol succinate (TOPROL-XL) 50  MG 24 hr tablet Take 50 mg by mouth 2 (two) times daily. Take with or immediately following a meal.  . Multiple Vitamin (MULTIVITAMIN WITH MINERALS) TABS tablet Take 1 tablet by mouth daily.  . potassium chloride 20 MEQ TBCR Take 20 mEq by mouth daily.  . rosuvastatin (CRESTOR) 40 MG tablet Take 40 mg by mouth daily.  Marland Kitchen thiamine 100 MG tablet Take 1 tablet (100 mg total) by mouth daily.  Marland Kitchen tiotropium (SPIRIVA) 18 MCG inhalation capsule Place 18 mcg into inhaler and inhale daily.   . vitamin E  400 UNIT capsule Take 400 Units by mouth daily.     Allergies:   Codeine   Social History   Tobacco Use  . Smoking status: Current Every Day Smoker    Packs/day: 1.00    Years: 40.00    Pack years: 40.00    Types: Cigarettes  . Smokeless tobacco: Never Used  . Tobacco comment: down to 5 cigarettes/day from 1.5 PPD  Substance Use Topics  . Alcohol use: Yes    Alcohol/week: 12.0 standard drinks    Types: 12 Standard drinks or equivalent per week  . Drug use: No     Family Hx: The patient's family history includes Congestive Heart Failure in his sister; Congestive Heart Failure (age of onset: 46) in his mother; Heart attack (age of onset: 7) in his father.  ROS:   Please see the history of present illness.     All other systems reviewed and are negative.   Prior CV studies:   The following studies were reviewed today:  Lower extremity arterial studies 02/07/2018:  Final Interpretation: Right: 30-49% stenosis noted in the superficial femoral artery. 50-74% stenosis noted in the superficial femoral artery and/or popliteal artery.  Left: 50-74% stenosis noted in the deep femoral artery. 50-74% stenosis noted in the superficial femoral artery.   Carotid Dopplers 02/07/2018:  Right Carotid: Velocities in the right ICA are consistent with a 1-39% stenosis.                The ECA appears >50% stenosed.  Left Carotid: Velocities in the left ICA are consistent with a 40-59% stenosis.               Non-hemodynamically significant plaque noted in the CCA.  Vertebrals:  Right vertebral artery demonstrates antegrade flow. Left vertebral              artery demonstrates high resistant flow. Subclavians: Normal flow hemodynamics were seen in bilateral subclavian              arteries.   Echocardiogram 12/04/2017:  - Left ventricle: The cavity size was normal. Wall thickness was   normal. Systolic function was normal. The estimated ejection   fraction was in the range of  55% to 60%. Wall motion was normal;   there were no regional wall motion abnormalities. Indeterminate   diastolic function. - Aortic valve: Mildly calcified annulus. Trileaflet; mildly   calcified leaflets. - Mitral valve: Mildly calcified annulus. There was trivial   regurgitation. - Right atrium: Central venous pressure (est): 3 mm Hg. - Atrial septum: No defect or patent foramen ovale was identified. - Tricuspid valve: There was mild regurgitation. - Pulmonary arteries: PA peak pressure: 19 mm Hg (S). - Pericardium, extracardiac: A prominent pericardial fat pad was   present.  Labs/Other Tests and Data Reviewed:    EKG:  No ECG reviewed.  Recent Labs: No results found for requested labs within last  8760 hours.   Recent Lipid Panel No results found for: CHOL, TRIG, HDL, CHOLHDL, LDLCALC, LDLDIRECT  Wt Readings from Last 3 Encounters:  07/22/19 176 lb (79.8 kg)  02/14/18 169 lb (76.7 kg)  11/28/17 160 lb (72.6 kg)     Objective:    Vital Signs:  BP 138/74   Ht 5\' 3"  (1.6 m)   Wt 176 lb (79.8 kg)   BMI 31.18 kg/m    VITAL SIGNS:  reviewed  ASSESSMENT & PLAN:    1.  Coronary disease with history of MI: Symptomatically stable.  Currently on aspirin, metoprolol succinate 50 mg daily, rosuvastatin 40 mg, and Imdur (10 mg).  It appears he has undergone cardiac catheterization in the past but did not require percutaneous coronary intervention. He is apparently on Plavix for history of stroke.  2.  History of CVA: He has had 3 CVAs.  Currently on Plavix and statin therapy.  3.  Peripheral vascular disease: History of bilateral carotid artery stents and bilateral lower extremity stents. Followed by vascular surgery. He is on aspirin, clopidogrel, and statin therapy.  Carotid Dopplers and lower extremity vascular studies reviewed above.  4.  Tobacco abuse: He smokes about 3-4 cigarettes daily.  5.  Chronic kidney disease stage III: Followed by nephrology.  6.   Hyperlipidemia: He is currently on rosuvastatin 40 mg.  7. HTN: BP is normal. No changes.   COVID-19 Education: The signs and symptoms of COVID-19 were discussed with the patient and how to seek care for testing (follow up with PCP or arrange E-visit).  The importance of social distancing was discussed today.  Time:   Today, I have spent 15 minutes with the patient with telehealth technology discussing the above problems.     Medication Adjustments/Labs and Tests Ordered: Current medicines are reviewed at length with the patient today.  Concerns regarding medicines are outlined above.   Tests Ordered: No orders of the defined types were placed in this encounter.   Medication Changes: No orders of the defined types were placed in this encounter.   Follow Up:  In Person in 1 year(s)  Signed, Kate Sable, MD  07/22/2019 10:09 AM    [place]

## 2019-07-28 DIAGNOSIS — Z720 Tobacco use: Secondary | ICD-10-CM | POA: Diagnosis not present

## 2019-07-28 DIAGNOSIS — N1832 Chronic kidney disease, stage 3b: Secondary | ICD-10-CM | POA: Diagnosis not present

## 2019-07-28 DIAGNOSIS — J449 Chronic obstructive pulmonary disease, unspecified: Secondary | ICD-10-CM | POA: Diagnosis not present

## 2019-07-28 DIAGNOSIS — I129 Hypertensive chronic kidney disease with stage 1 through stage 4 chronic kidney disease, or unspecified chronic kidney disease: Secondary | ICD-10-CM | POA: Diagnosis not present

## 2019-08-25 DIAGNOSIS — I129 Hypertensive chronic kidney disease with stage 1 through stage 4 chronic kidney disease, or unspecified chronic kidney disease: Secondary | ICD-10-CM | POA: Diagnosis not present

## 2019-08-25 DIAGNOSIS — Z720 Tobacco use: Secondary | ICD-10-CM | POA: Diagnosis not present

## 2019-08-25 DIAGNOSIS — N1832 Chronic kidney disease, stage 3b: Secondary | ICD-10-CM | POA: Diagnosis not present

## 2019-08-25 DIAGNOSIS — J449 Chronic obstructive pulmonary disease, unspecified: Secondary | ICD-10-CM | POA: Diagnosis not present

## 2019-09-05 ENCOUNTER — Ambulatory Visit (HOSPITAL_COMMUNITY)
Admission: RE | Admit: 2019-09-05 | Discharge: 2019-09-05 | Disposition: A | Discharge status: Home / Self Care | Payer: Medicare Other | Source: Ambulatory Visit | Attending: Nephrology | Admitting: Nephrology

## 2019-09-05 ENCOUNTER — Other Ambulatory Visit: Payer: Self-pay

## 2019-09-05 DIAGNOSIS — N189 Chronic kidney disease, unspecified: Secondary | ICD-10-CM | POA: Diagnosis not present

## 2019-09-05 DIAGNOSIS — R809 Proteinuria, unspecified: Secondary | ICD-10-CM | POA: Diagnosis not present

## 2019-09-05 DIAGNOSIS — N1832 Chronic kidney disease, stage 3b: Secondary | ICD-10-CM | POA: Diagnosis not present

## 2019-09-25 DIAGNOSIS — J449 Chronic obstructive pulmonary disease, unspecified: Secondary | ICD-10-CM | POA: Diagnosis not present

## 2019-09-25 DIAGNOSIS — Z720 Tobacco use: Secondary | ICD-10-CM | POA: Diagnosis not present

## 2019-09-25 DIAGNOSIS — I129 Hypertensive chronic kidney disease with stage 1 through stage 4 chronic kidney disease, or unspecified chronic kidney disease: Secondary | ICD-10-CM | POA: Diagnosis not present

## 2019-09-25 DIAGNOSIS — N1832 Chronic kidney disease, stage 3b: Secondary | ICD-10-CM | POA: Diagnosis not present

## 2019-09-30 DIAGNOSIS — I639 Cerebral infarction, unspecified: Secondary | ICD-10-CM | POA: Diagnosis not present

## 2019-09-30 DIAGNOSIS — I10 Essential (primary) hypertension: Secondary | ICD-10-CM | POA: Diagnosis not present

## 2019-09-30 DIAGNOSIS — Z1389 Encounter for screening for other disorder: Secondary | ICD-10-CM | POA: Diagnosis not present

## 2019-09-30 DIAGNOSIS — I2510 Atherosclerotic heart disease of native coronary artery without angina pectoris: Secondary | ICD-10-CM | POA: Diagnosis not present

## 2019-09-30 DIAGNOSIS — Z0000 Encounter for general adult medical examination without abnormal findings: Secondary | ICD-10-CM | POA: Diagnosis not present

## 2019-09-30 DIAGNOSIS — G629 Polyneuropathy, unspecified: Secondary | ICD-10-CM | POA: Diagnosis not present

## 2019-09-30 DIAGNOSIS — E538 Deficiency of other specified B group vitamins: Secondary | ICD-10-CM | POA: Diagnosis not present

## 2019-09-30 DIAGNOSIS — E7849 Other hyperlipidemia: Secondary | ICD-10-CM | POA: Diagnosis not present

## 2019-09-30 DIAGNOSIS — E039 Hypothyroidism, unspecified: Secondary | ICD-10-CM | POA: Diagnosis not present

## 2019-10-02 DIAGNOSIS — E785 Hyperlipidemia, unspecified: Secondary | ICD-10-CM | POA: Diagnosis not present

## 2019-10-02 DIAGNOSIS — I2510 Atherosclerotic heart disease of native coronary artery without angina pectoris: Secondary | ICD-10-CM | POA: Diagnosis not present

## 2019-10-02 DIAGNOSIS — N1832 Chronic kidney disease, stage 3b: Secondary | ICD-10-CM | POA: Diagnosis not present

## 2019-10-02 DIAGNOSIS — R6889 Other general symptoms and signs: Secondary | ICD-10-CM | POA: Diagnosis not present

## 2019-10-02 DIAGNOSIS — R809 Proteinuria, unspecified: Secondary | ICD-10-CM | POA: Diagnosis not present

## 2019-10-02 DIAGNOSIS — I129 Hypertensive chronic kidney disease with stage 1 through stage 4 chronic kidney disease, or unspecified chronic kidney disease: Secondary | ICD-10-CM | POA: Diagnosis not present

## 2019-10-07 ENCOUNTER — Telehealth: Payer: Self-pay

## 2019-10-07 MED ORDER — EZETIMIBE 10 MG PO TABS: 10.0000 mg | ORAL_TABLET | Freq: Every day | ORAL | 3 refills | Status: AC

## 2019-10-07 NOTE — Telephone Encounter (Signed)
Labs reviewed from [place], per Dr.Koneswaran, add zetia 10 mg qd, pt aware, e-scribed to [place].labs to scan

## 2019-10-25 DIAGNOSIS — N1832 Chronic kidney disease, stage 3b: Secondary | ICD-10-CM | POA: Diagnosis not present

## 2019-10-25 DIAGNOSIS — I129 Hypertensive chronic kidney disease with stage 1 through stage 4 chronic kidney disease, or unspecified chronic kidney disease: Secondary | ICD-10-CM | POA: Diagnosis not present

## 2019-10-25 DIAGNOSIS — J449 Chronic obstructive pulmonary disease, unspecified: Secondary | ICD-10-CM | POA: Diagnosis not present

## 2019-10-25 DIAGNOSIS — Z720 Tobacco use: Secondary | ICD-10-CM | POA: Diagnosis not present

## 2019-11-25 DIAGNOSIS — I129 Hypertensive chronic kidney disease with stage 1 through stage 4 chronic kidney disease, or unspecified chronic kidney disease: Secondary | ICD-10-CM | POA: Diagnosis not present

## 2019-11-25 DIAGNOSIS — J449 Chronic obstructive pulmonary disease, unspecified: Secondary | ICD-10-CM | POA: Diagnosis not present

## 2019-11-25 DIAGNOSIS — Z720 Tobacco use: Secondary | ICD-10-CM | POA: Diagnosis not present

## 2019-11-25 DIAGNOSIS — N1832 Chronic kidney disease, stage 3b: Secondary | ICD-10-CM | POA: Diagnosis not present

## 2019-12-25 DIAGNOSIS — J449 Chronic obstructive pulmonary disease, unspecified: Secondary | ICD-10-CM | POA: Diagnosis not present

## 2019-12-25 DIAGNOSIS — N1832 Chronic kidney disease, stage 3b: Secondary | ICD-10-CM | POA: Diagnosis not present

## 2019-12-25 DIAGNOSIS — Z720 Tobacco use: Secondary | ICD-10-CM | POA: Diagnosis not present

## 2019-12-25 DIAGNOSIS — I129 Hypertensive chronic kidney disease with stage 1 through stage 4 chronic kidney disease, or unspecified chronic kidney disease: Secondary | ICD-10-CM | POA: Diagnosis not present

## 2019-12-31 DIAGNOSIS — M19011 Primary osteoarthritis, right shoulder: Secondary | ICD-10-CM | POA: Diagnosis not present

## 2020-01-24 DIAGNOSIS — Z720 Tobacco use: Secondary | ICD-10-CM | POA: Diagnosis not present

## 2020-01-24 DIAGNOSIS — N1832 Chronic kidney disease, stage 3b: Secondary | ICD-10-CM | POA: Diagnosis not present

## 2020-01-24 DIAGNOSIS — J449 Chronic obstructive pulmonary disease, unspecified: Secondary | ICD-10-CM | POA: Diagnosis not present

## 2020-01-24 DIAGNOSIS — I129 Hypertensive chronic kidney disease with stage 1 through stage 4 chronic kidney disease, or unspecified chronic kidney disease: Secondary | ICD-10-CM | POA: Diagnosis not present

## 2020-02-17 DIAGNOSIS — I129 Hypertensive chronic kidney disease with stage 1 through stage 4 chronic kidney disease, or unspecified chronic kidney disease: Secondary | ICD-10-CM | POA: Diagnosis not present

## 2020-02-17 DIAGNOSIS — R6889 Other general symptoms and signs: Secondary | ICD-10-CM | POA: Diagnosis not present

## 2020-02-17 DIAGNOSIS — R809 Proteinuria, unspecified: Secondary | ICD-10-CM | POA: Diagnosis not present

## 2020-02-17 DIAGNOSIS — N1832 Chronic kidney disease, stage 3b: Secondary | ICD-10-CM | POA: Diagnosis not present

## 2020-02-17 DIAGNOSIS — E785 Hyperlipidemia, unspecified: Secondary | ICD-10-CM | POA: Diagnosis not present

## 2020-02-17 DIAGNOSIS — I2510 Atherosclerotic heart disease of native coronary artery without angina pectoris: Secondary | ICD-10-CM | POA: Diagnosis not present

## 2020-02-25 DIAGNOSIS — N1832 Chronic kidney disease, stage 3b: Secondary | ICD-10-CM | POA: Diagnosis not present

## 2020-02-25 DIAGNOSIS — Z720 Tobacco use: Secondary | ICD-10-CM | POA: Diagnosis not present

## 2020-02-25 DIAGNOSIS — J449 Chronic obstructive pulmonary disease, unspecified: Secondary | ICD-10-CM | POA: Diagnosis not present

## 2020-02-25 DIAGNOSIS — I129 Hypertensive chronic kidney disease with stage 1 through stage 4 chronic kidney disease, or unspecified chronic kidney disease: Secondary | ICD-10-CM | POA: Diagnosis not present

## 2020-03-26 DIAGNOSIS — N1832 Chronic kidney disease, stage 3b: Secondary | ICD-10-CM | POA: Diagnosis not present

## 2020-03-26 DIAGNOSIS — I129 Hypertensive chronic kidney disease with stage 1 through stage 4 chronic kidney disease, or unspecified chronic kidney disease: Secondary | ICD-10-CM | POA: Diagnosis not present

## 2020-03-26 DIAGNOSIS — J449 Chronic obstructive pulmonary disease, unspecified: Secondary | ICD-10-CM | POA: Diagnosis not present

## 2020-03-26 DIAGNOSIS — Z720 Tobacco use: Secondary | ICD-10-CM | POA: Diagnosis not present

## 2020-04-25 DIAGNOSIS — J449 Chronic obstructive pulmonary disease, unspecified: Secondary | ICD-10-CM | POA: Diagnosis not present

## 2020-04-25 DIAGNOSIS — I129 Hypertensive chronic kidney disease with stage 1 through stage 4 chronic kidney disease, or unspecified chronic kidney disease: Secondary | ICD-10-CM | POA: Diagnosis not present

## 2020-04-25 DIAGNOSIS — N1832 Chronic kidney disease, stage 3b: Secondary | ICD-10-CM | POA: Diagnosis not present

## 2020-04-25 DIAGNOSIS — Z720 Tobacco use: Secondary | ICD-10-CM | POA: Diagnosis not present

## 2020-05-26 DIAGNOSIS — J449 Chronic obstructive pulmonary disease, unspecified: Secondary | ICD-10-CM | POA: Diagnosis not present

## 2020-05-26 DIAGNOSIS — Z720 Tobacco use: Secondary | ICD-10-CM | POA: Diagnosis not present

## 2020-05-26 DIAGNOSIS — I129 Hypertensive chronic kidney disease with stage 1 through stage 4 chronic kidney disease, or unspecified chronic kidney disease: Secondary | ICD-10-CM | POA: Diagnosis not present

## 2020-05-26 DIAGNOSIS — N1832 Chronic kidney disease, stage 3b: Secondary | ICD-10-CM | POA: Diagnosis not present

## 2020-06-10 DIAGNOSIS — N390 Urinary tract infection, site not specified: Secondary | ICD-10-CM | POA: Diagnosis not present

## 2020-06-10 DIAGNOSIS — E785 Hyperlipidemia, unspecified: Secondary | ICD-10-CM | POA: Diagnosis not present

## 2020-06-10 DIAGNOSIS — I2510 Atherosclerotic heart disease of native coronary artery without angina pectoris: Secondary | ICD-10-CM | POA: Diagnosis not present

## 2020-06-10 DIAGNOSIS — I129 Hypertensive chronic kidney disease with stage 1 through stage 4 chronic kidney disease, or unspecified chronic kidney disease: Secondary | ICD-10-CM | POA: Diagnosis not present

## 2020-06-10 DIAGNOSIS — R809 Proteinuria, unspecified: Secondary | ICD-10-CM | POA: Diagnosis not present

## 2020-06-10 DIAGNOSIS — N1832 Chronic kidney disease, stage 3b: Secondary | ICD-10-CM | POA: Diagnosis not present

## 2020-06-26 DIAGNOSIS — J449 Chronic obstructive pulmonary disease, unspecified: Secondary | ICD-10-CM | POA: Diagnosis not present

## 2020-06-26 DIAGNOSIS — Z720 Tobacco use: Secondary | ICD-10-CM | POA: Diagnosis not present

## 2020-06-26 DIAGNOSIS — N1832 Chronic kidney disease, stage 3b: Secondary | ICD-10-CM | POA: Diagnosis not present

## 2020-06-26 DIAGNOSIS — I129 Hypertensive chronic kidney disease with stage 1 through stage 4 chronic kidney disease, or unspecified chronic kidney disease: Secondary | ICD-10-CM | POA: Diagnosis not present

## 2020-07-08 DIAGNOSIS — I2510 Atherosclerotic heart disease of native coronary artery without angina pectoris: Secondary | ICD-10-CM | POA: Diagnosis not present

## 2020-07-08 DIAGNOSIS — Z1389 Encounter for screening for other disorder: Secondary | ICD-10-CM | POA: Diagnosis not present

## 2020-07-08 DIAGNOSIS — Z0001 Encounter for general adult medical examination with abnormal findings: Secondary | ICD-10-CM | POA: Diagnosis not present

## 2020-07-08 DIAGNOSIS — E039 Hypothyroidism, unspecified: Secondary | ICD-10-CM | POA: Diagnosis not present

## 2020-07-08 DIAGNOSIS — D649 Anemia, unspecified: Secondary | ICD-10-CM | POA: Diagnosis not present

## 2020-07-08 DIAGNOSIS — E7849 Other hyperlipidemia: Secondary | ICD-10-CM | POA: Diagnosis not present

## 2020-07-08 DIAGNOSIS — I639 Cerebral infarction, unspecified: Secondary | ICD-10-CM | POA: Diagnosis not present

## 2020-07-08 DIAGNOSIS — I10 Essential (primary) hypertension: Secondary | ICD-10-CM | POA: Diagnosis not present

## 2020-07-08 DIAGNOSIS — F17210 Nicotine dependence, cigarettes, uncomplicated: Secondary | ICD-10-CM | POA: Diagnosis not present

## 2020-07-08 DIAGNOSIS — J449 Chronic obstructive pulmonary disease, unspecified: Secondary | ICD-10-CM | POA: Diagnosis not present

## 2020-07-08 DIAGNOSIS — Z720 Tobacco use: Secondary | ICD-10-CM | POA: Diagnosis not present

## 2020-07-22 ENCOUNTER — Ambulatory Visit: Payer: Medicare Other | Admitting: Student

## 2020-07-22 NOTE — Progress Notes (Deleted)
Cardiology Office Note    Date:  07/22/2020   ID:  George Valdez, DOB 1955/08/05, MRN [id number]  PCP:  Patient, No Pcp Per  Cardiologist: Kate Sable, MD (Inactive)  --> Needs to switch to new MD  No chief complaint on file.   History of Present Illness:    George Valdez is a 65 y.o. male with past medical history of CAD (s/p prior cath in [place], [place] but did not require PCI), carotid artery stenosis (s/p prior CEA), PVD (s/p lower extremity stents), HTN, HLD, Stage 3 CKD, tobacco use and prior CVA who presents to the office today for annual follow-up.   He most recently had a telehealth visit with Dr. Bronson Ing in 06/2019 and denied any recent chest pain or palpitations at that time. Did report chronic dizziness which had been present since his prior CVA. He was continued on his current medication regimen and informed to follow-up in 1 year.   - referral back to Dr. Scot Dock - labs from PCP  Past Medical History:  Diagnosis Date  . Alcohol abuse   . Anxiety   . BPH (benign prostatic hyperplasia)   . Chronic pain   . Coronary artery disease   . Depression   . Frequent falls   . Hyperlipemia   . Hypertension   . Neuropathy   . Pernicious anemia   . Stroke [name]    left sided weakness    Past Surgical History:  Procedure Laterality Date  . CARDIAC CATHETERIZATION    . CAROTID ENDARTERECTOMY      Current Medications: Outpatient Medications Prior to Visit  Medication Sig Dispense Refill  . albuterol (PROVENTIL HFA;VENTOLIN HFA) 108 (90 BASE) MCG/ACT inhaler Inhale 2 puffs into the lungs every 6 (six) hours as needed for wheezing or shortness of breath.    Marland Kitchen aspirin EC 81 MG tablet Take 81 mg by mouth daily. Reported on 11/16/2015    . chlordiazePOXIDE (LIBRIUM) 5 MG capsule Take 1 capsule (5 mg total) by mouth 2 (two) times daily as needed for anxiety. 30 capsule 0  . clopidogrel (PLAVIX) 75 MG tablet Take 75 mg by mouth daily.    .  cyanocobalamin (,VITAMIN B-12,) 1000 MCG/ML injection Inject 1,000 mcg into the muscle every 30 (thirty) days.    . DULoxetine (CYMBALTA) 60 MG capsule Take 60 mg by mouth daily.    Marland Kitchen ezetimibe (ZETIA) 10 MG tablet Take 1 tablet (10 mg total) by mouth daily. 90 tablet 3  . folic acid (FOLVITE) 1 MG tablet Take 1 tablet (1 mg total) by mouth daily.    Marland Kitchen gabapentin (NEURONTIN) 800 MG tablet Take 800 mg by mouth 3 (three) times daily.    Marland Kitchen HYDROcodone-acetaminophen (NORCO/VICODIN) 5-325 MG tablet Take 1 tablet by mouth every 6 (six) hours as needed for moderate pain.    . isosorbide mononitrate (ISMO,MONOKET) 20 MG tablet Take 10 mg by mouth daily.    . metoprolol succinate (TOPROL-XL) 50 MG 24 hr tablet Take 50 mg by mouth 2 (two) times daily. Take with or immediately following a meal.    . Multiple Vitamin (MULTIVITAMIN WITH MINERALS) TABS tablet Take 1 tablet by mouth daily.    . potassium chloride 20 MEQ TBCR Take 20 mEq by mouth daily. 30 tablet 0  . rosuvastatin (CRESTOR) 40 MG tablet Take 40 mg by mouth daily.    Marland Kitchen thiamine 100 MG tablet Take 1 tablet (100 mg total) by mouth daily. 30 tablet 0  .  tiotropium (SPIRIVA) 18 MCG inhalation capsule Place 18 mcg into inhaler and inhale daily.     . vitamin E 400 UNIT capsule Take 400 Units by mouth daily.     No facility-administered medications prior to visit.     Allergies:   Codeine   Social History   Socioeconomic History  . Marital status: Married    Spouse name: Not on file  . Number of children: Not on file  . Years of education: Not on file  . Highest education level: Not on file  Occupational History  . Not on file  Tobacco Use  . Smoking status: Current Every Day Smoker    Packs/day: 1.00    Years: 40.00    Pack years: 40.00    Types: Cigarettes  . Smokeless tobacco: Never Used  . Tobacco comment: down to 5 cigarettes/day from 1.5 PPD  Vaping Use  . Vaping Use: Never used  Substance and Sexual Activity  . Alcohol use:  Yes    Alcohol/week: 12.0 standard drinks    Types: 12 Standard drinks or equivalent per week  . Drug use: No  . Sexual activity: Not on file  Other Topics Concern  . Not on file  Social History Narrative  . Not on file   Social Determinants of Health   Financial Resource Strain: Not on file  Food Insecurity: Not on file  Transportation Needs: Not on file  Physical Activity: Not on file  Stress: Not on file  Social Connections: Not on file     Family History:  The patient's family history includes Congestive Heart Failure in his sister; Congestive Heart Failure (age of onset: 38) in his mother; Heart attack (age of onset: 50) in his father.   Review of Systems:   Please see the history of present illness.     General:  No chills, fever, night sweats or weight changes.  Cardiovascular:  No chest pain, dyspnea on exertion, edema, orthopnea, palpitations, paroxysmal nocturnal dyspnea. Dermatological: No rash, lesions/masses Respiratory: No cough, dyspnea Urologic: No hematuria, dysuria Abdominal:   No nausea, vomiting, diarrhea, bright red blood per rectum, melena, or hematemesis Neurologic:  No visual changes, wkns, changes in mental status. All other systems reviewed and are otherwise negative except as noted above.   Physical Exam:    VS:  There were no vitals taken for this visit.   General: Well developed, well nourished,male appearing in no acute distress. Head: Normocephalic, atraumatic. Neck: No carotid bruits. JVD not elevated.  Lungs: Respirations regular and unlabored, without wheezes or rales.  Heart: ***Regular rate and rhythm. No S3 or S4.  No murmur, no rubs, or gallops appreciated. Abdomen: Appears non-distended. No obvious abdominal masses. Msk:  Strength and tone appear normal for age. No obvious joint deformities or effusions. Extremities: No clubbing or cyanosis. No edema.  Distal pedal pulses are 2+ bilaterally. Neuro: Alert and oriented X 3. Moves all  extremities spontaneously. No focal deficits noted. Psych:  Responds to questions appropriately with a normal affect. Skin: No rashes or lesions noted  Wt Readings from Last 3 Encounters:  07/22/19 176 lb (79.8 kg)  02/14/18 169 lb (76.7 kg)  11/28/17 160 lb (72.6 kg)        Studies/Labs Reviewed:   EKG:  EKG is*** ordered today.  The ekg ordered today demonstrates ***  Recent Labs: No results found for requested labs within last 8760 hours.   Lipid Panel No results found for: CHOL, TRIG, HDL, CHOLHDL, VLDL, LDLCALC,  LDLDIRECT  Additional studies/ records that were reviewed today include:   Echocardiogram: 11/2017 Study Conclusions   - Left ventricle: The cavity size was normal. Wall thickness was  normal. Systolic function was normal. The estimated ejection  fraction was in the range of 55% to 60%. Wall motion was normal;  there were no regional wall motion abnormalities. Indeterminate  diastolic function.  - Aortic valve: Mildly calcified annulus. Trileaflet; mildly  calcified leaflets.  - Mitral valve: Mildly calcified annulus. There was trivial  regurgitation.  - Right atrium: Central venous pressure (est): 3 mm Hg.  - Atrial septum: No defect or patent foramen ovale was identified.  - Tricuspid valve: There was mild regurgitation.  - Pulmonary arteries: PA peak pressure: 19 mm Hg (S).  - Pericardium, extracardiac: A prominent pericardial fat pad was  present.   Carotid Dopplers: 01/2018 Final Interpretation:  Right Carotid: Velocities in the right ICA are consistent with a 1-39%  stenosis.         The ECA appears >50% stenosed.   Left Carotid: Velocities in the left ICA are consistent with a 40-59%  stenosis.        Non-hemodynamically significant plaque noted in the CCA.   Vertebrals: Right vertebral artery demonstrates antegrade flow. Left  vertebral        artery demonstrates high resistant flow.  Subclavians:  Normal flow hemodynamics were seen in bilateral subclavian        arteries.    Lower Extremity Arterial Dopplers: 01/2018 Final Interpretation:  Right: 30-49% stenosis noted in the superficial femoral artery. 50-74%  stenosis noted in the superficial femoral artery and/or popliteal artery.   Left: 50-74% stenosis noted in the deep femoral artery. 50-74% stenosis  noted in the superficial femoral artery.   Assessment:    No diagnosis found.   Plan:   In order of problems listed above:  1. ***    Shared Decision Making/Informed Consent:   {Are you ordering a CV Procedure (e.g. stress test, cath, DCCV, TEE, etc)?   Press F2        :[id number]}    Medication Adjustments/Labs and Tests Ordered: Current medicines are reviewed at length with the patient today.  Concerns regarding medicines are outlined above.  Medication changes, Labs and Tests ordered today are listed in the Patient Instructions below. There are no Patient Instructions on file for this visit.   Signed, Erma Heritage, PA-C  07/22/2020 10:16 AM    [facility] [address] [place], [place] [address] Phone: [phone] Fax: [phone]

## 2020-07-25 DIAGNOSIS — Z720 Tobacco use: Secondary | ICD-10-CM | POA: Diagnosis not present

## 2020-07-25 DIAGNOSIS — I129 Hypertensive chronic kidney disease with stage 1 through stage 4 chronic kidney disease, or unspecified chronic kidney disease: Secondary | ICD-10-CM | POA: Diagnosis not present

## 2020-07-25 DIAGNOSIS — J449 Chronic obstructive pulmonary disease, unspecified: Secondary | ICD-10-CM | POA: Diagnosis not present

## 2020-07-25 DIAGNOSIS — N1832 Chronic kidney disease, stage 3b: Secondary | ICD-10-CM | POA: Diagnosis not present

## 2020-08-24 DIAGNOSIS — I129 Hypertensive chronic kidney disease with stage 1 through stage 4 chronic kidney disease, or unspecified chronic kidney disease: Secondary | ICD-10-CM | POA: Diagnosis not present

## 2020-08-24 DIAGNOSIS — J449 Chronic obstructive pulmonary disease, unspecified: Secondary | ICD-10-CM | POA: Diagnosis not present

## 2020-08-24 DIAGNOSIS — Z720 Tobacco use: Secondary | ICD-10-CM | POA: Diagnosis not present

## 2020-08-24 DIAGNOSIS — N1832 Chronic kidney disease, stage 3b: Secondary | ICD-10-CM | POA: Diagnosis not present

## 2020-09-23 DIAGNOSIS — I129 Hypertensive chronic kidney disease with stage 1 through stage 4 chronic kidney disease, or unspecified chronic kidney disease: Secondary | ICD-10-CM | POA: Diagnosis not present

## 2020-09-23 DIAGNOSIS — N1832 Chronic kidney disease, stage 3b: Secondary | ICD-10-CM | POA: Diagnosis not present

## 2020-09-23 DIAGNOSIS — J449 Chronic obstructive pulmonary disease, unspecified: Secondary | ICD-10-CM | POA: Diagnosis not present

## 2020-09-23 DIAGNOSIS — Z720 Tobacco use: Secondary | ICD-10-CM | POA: Diagnosis not present

## 2020-10-24 DIAGNOSIS — Z720 Tobacco use: Secondary | ICD-10-CM | POA: Diagnosis not present

## 2020-10-24 DIAGNOSIS — I129 Hypertensive chronic kidney disease with stage 1 through stage 4 chronic kidney disease, or unspecified chronic kidney disease: Secondary | ICD-10-CM | POA: Diagnosis not present

## 2020-10-24 DIAGNOSIS — J449 Chronic obstructive pulmonary disease, unspecified: Secondary | ICD-10-CM | POA: Diagnosis not present

## 2020-10-24 DIAGNOSIS — N1832 Chronic kidney disease, stage 3b: Secondary | ICD-10-CM | POA: Diagnosis not present

## 2020-10-27 DIAGNOSIS — N1832 Chronic kidney disease, stage 3b: Secondary | ICD-10-CM | POA: Diagnosis not present

## 2020-10-27 DIAGNOSIS — J449 Chronic obstructive pulmonary disease, unspecified: Secondary | ICD-10-CM | POA: Diagnosis not present

## 2020-10-27 DIAGNOSIS — G629 Polyneuropathy, unspecified: Secondary | ICD-10-CM | POA: Diagnosis not present

## 2020-10-27 DIAGNOSIS — I129 Hypertensive chronic kidney disease with stage 1 through stage 4 chronic kidney disease, or unspecified chronic kidney disease: Secondary | ICD-10-CM | POA: Diagnosis not present

## 2020-10-27 DIAGNOSIS — I2510 Atherosclerotic heart disease of native coronary artery without angina pectoris: Secondary | ICD-10-CM | POA: Diagnosis not present

## 2020-10-27 DIAGNOSIS — R809 Proteinuria, unspecified: Secondary | ICD-10-CM | POA: Diagnosis not present

## 2020-10-27 DIAGNOSIS — E785 Hyperlipidemia, unspecified: Secondary | ICD-10-CM | POA: Diagnosis not present

## 2020-10-27 DIAGNOSIS — Z720 Tobacco use: Secondary | ICD-10-CM | POA: Diagnosis not present

## 2020-11-04 IMAGING — US US RENAL
1 series · 14 of 25 positions shown · non-contrast
Comparison: Renal ultrasound 12/01/2015.

CLINICAL DATA: Chronic kidney disease.

EXAM:
RENAL / URINARY TRACT ULTRASOUND COMPLETE

[Series 1: us renal · 0.23mm/px · 14 of 73 slices shown]
[im 1/73]
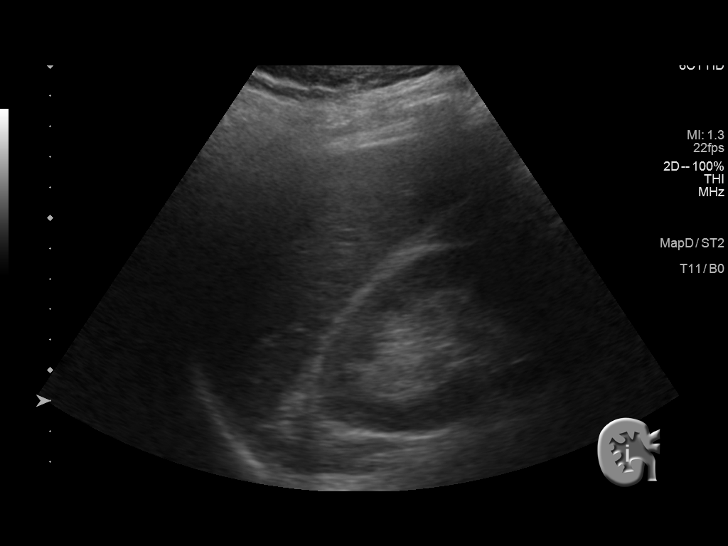
[im 7/73]
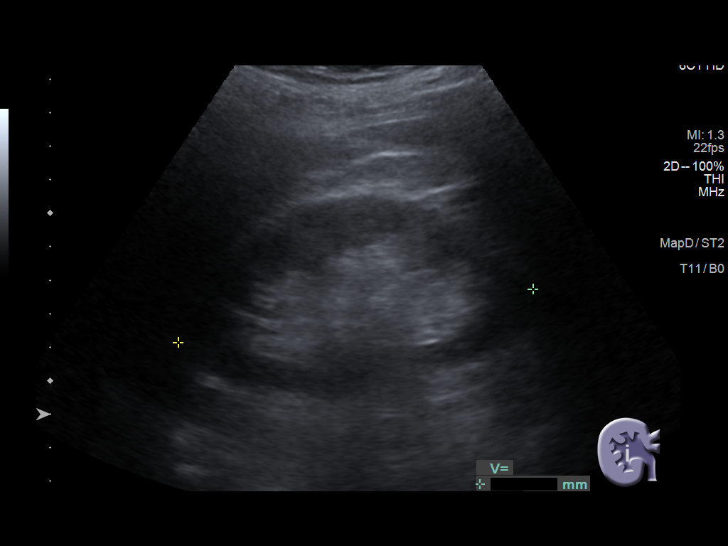
[im 13/73]
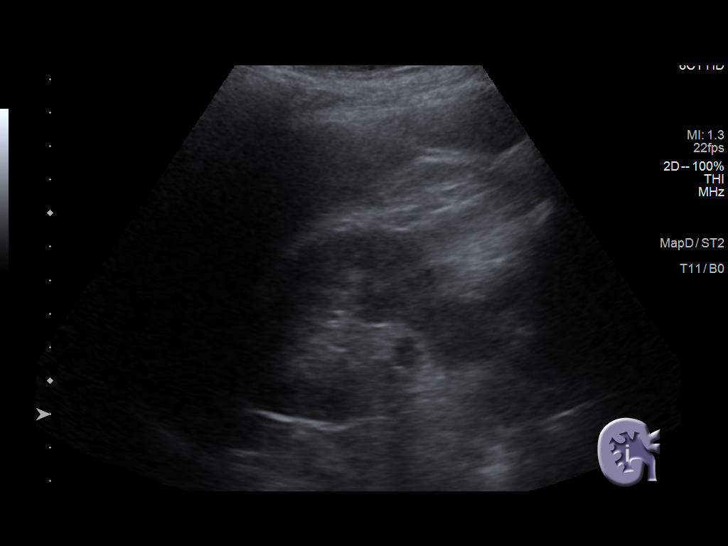
[im 19/73]
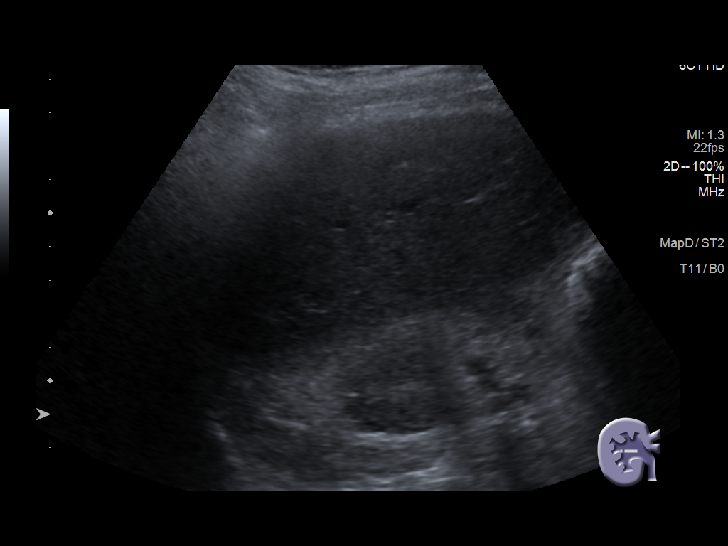
[im 25/73]
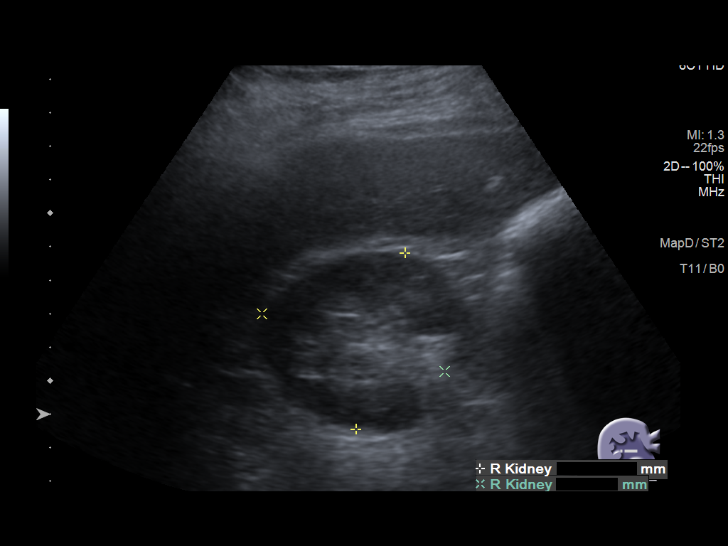
[im 28/73]
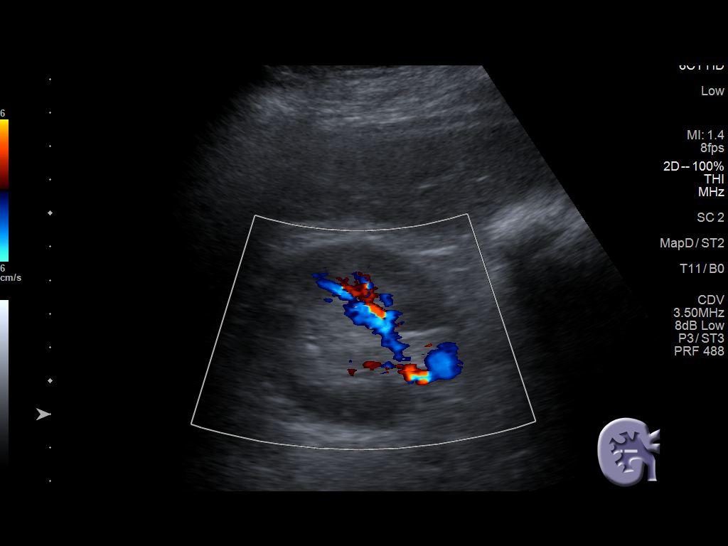
[im 34/73]
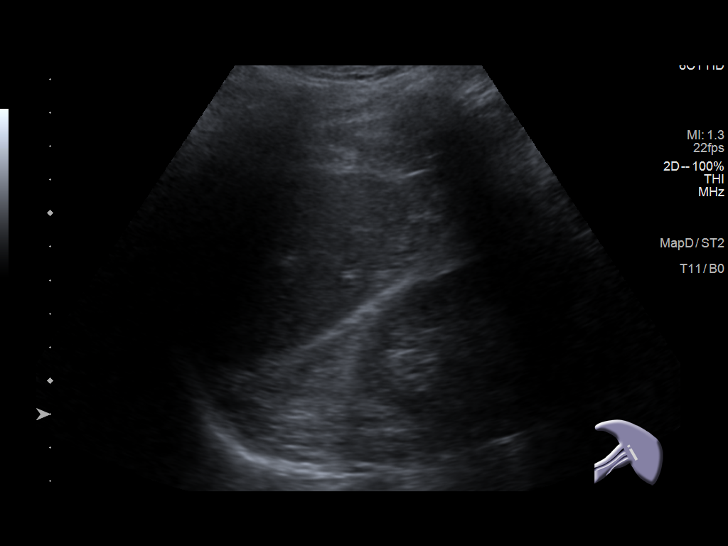
[im 40/73]
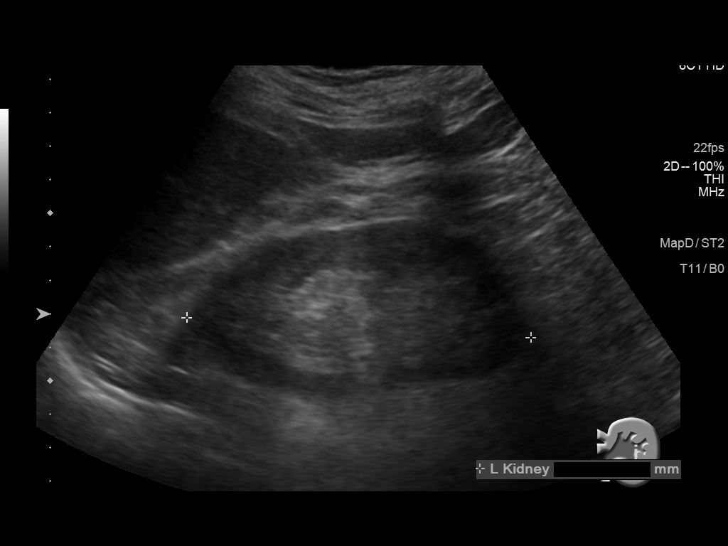
[im 46/73]
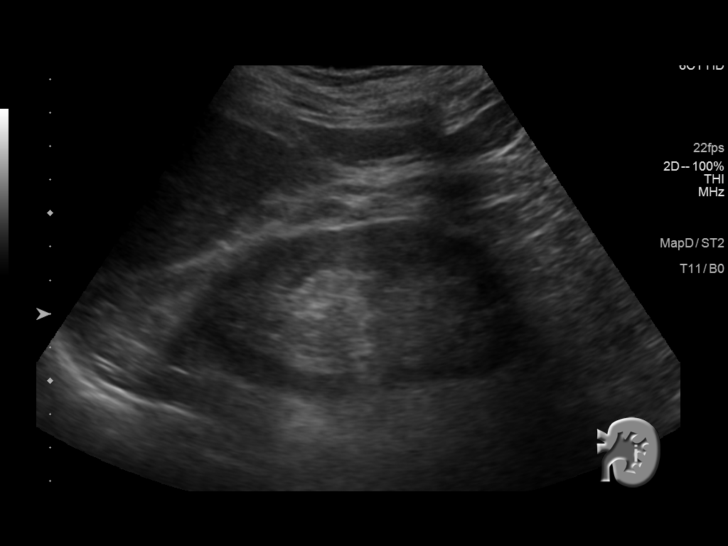
[im 49/73]
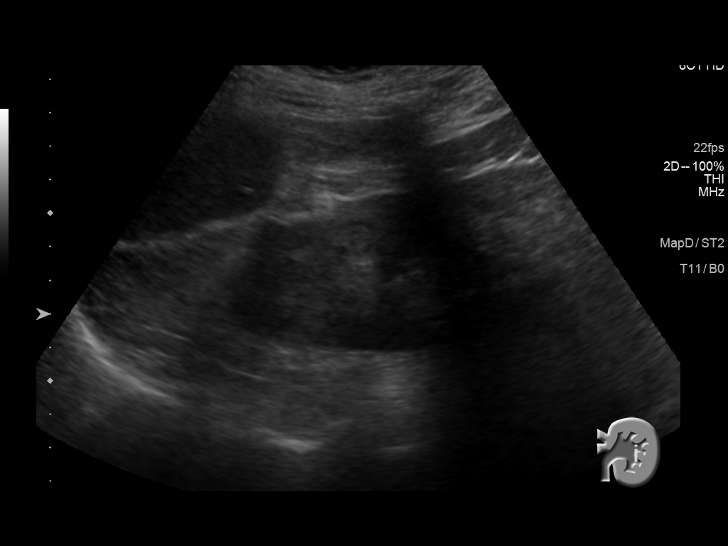
[im 55/73]
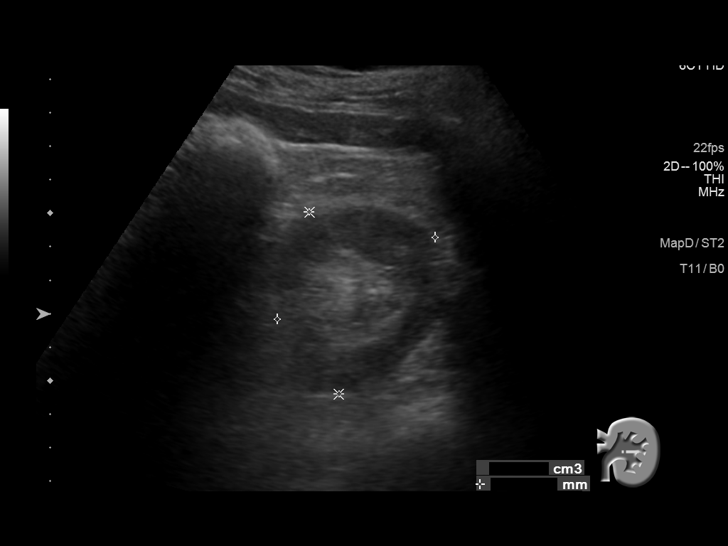
[im 61/73]
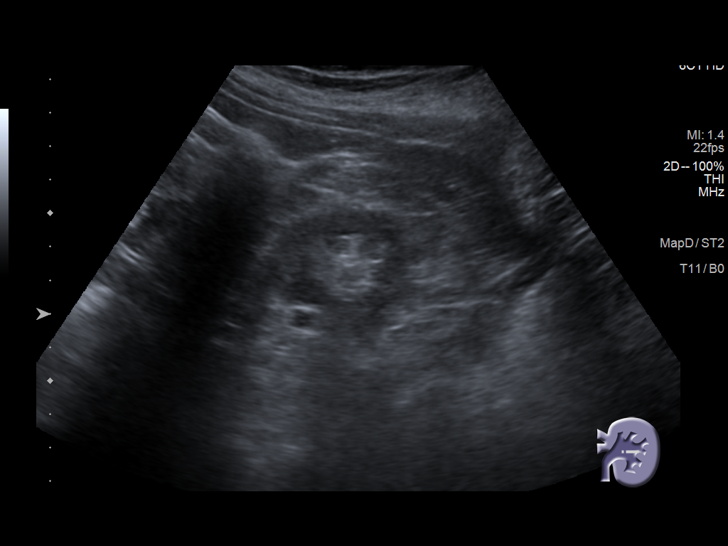
[im 67/73]
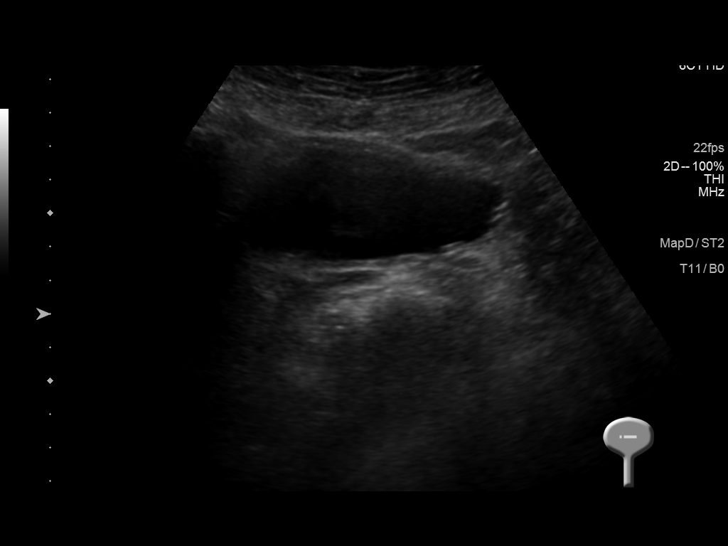
[im 73/73]
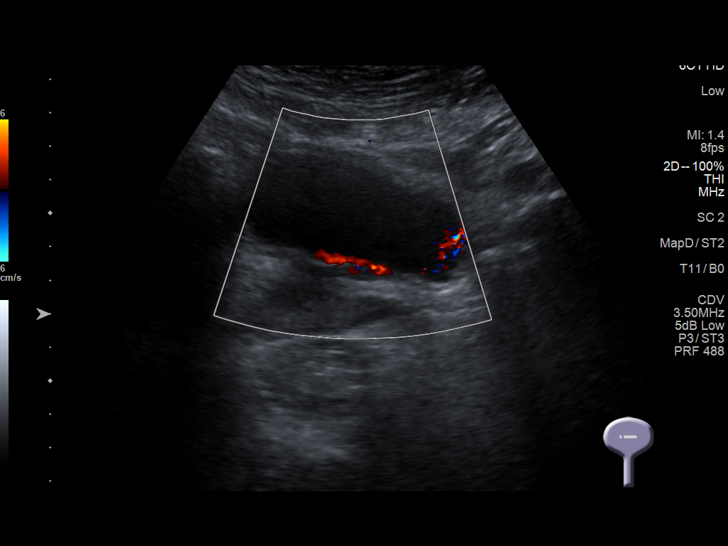

[14 of 25 positions shown; findings below may reference images not displayed]

FINDINGS: Right Kidney:

Renal measurements: 10.7 x 5.4 x 5.8 cm = volume: 176 mL. Cortical
echogenicity is increased. No mass or hydronephrosis visualized.

Left Kidney:

Renal measurements: 10.1 x 5.5 x 5.3 cm = volume: 154 mL. Cortical
echogenicity is increased. No mass or hydronephrosis visualized.

Bladder:

Appears normal for degree of bladder distention.

Other:

None.
IMPRESSION: Negative for hydronephrosis or other acute abnormality.

Increased cortical echogenicity of both kidneys is compatible with
medical renal disease.

## 2020-11-24 DIAGNOSIS — J449 Chronic obstructive pulmonary disease, unspecified: Secondary | ICD-10-CM | POA: Diagnosis not present

## 2020-11-24 DIAGNOSIS — I129 Hypertensive chronic kidney disease with stage 1 through stage 4 chronic kidney disease, or unspecified chronic kidney disease: Secondary | ICD-10-CM | POA: Diagnosis not present

## 2020-11-24 DIAGNOSIS — N1832 Chronic kidney disease, stage 3b: Secondary | ICD-10-CM | POA: Diagnosis not present

## 2020-12-24 DIAGNOSIS — I129 Hypertensive chronic kidney disease with stage 1 through stage 4 chronic kidney disease, or unspecified chronic kidney disease: Secondary | ICD-10-CM | POA: Diagnosis not present

## 2020-12-24 DIAGNOSIS — N1832 Chronic kidney disease, stage 3b: Secondary | ICD-10-CM | POA: Diagnosis not present

## 2020-12-24 DIAGNOSIS — J449 Chronic obstructive pulmonary disease, unspecified: Secondary | ICD-10-CM | POA: Diagnosis not present

## 2021-01-04 DIAGNOSIS — E782 Mixed hyperlipidemia: Secondary | ICD-10-CM | POA: Diagnosis not present

## 2021-01-04 DIAGNOSIS — I2510 Atherosclerotic heart disease of native coronary artery without angina pectoris: Secondary | ICD-10-CM | POA: Diagnosis not present

## 2021-01-04 DIAGNOSIS — E538 Deficiency of other specified B group vitamins: Secondary | ICD-10-CM | POA: Diagnosis not present

## 2021-01-04 DIAGNOSIS — G629 Polyneuropathy, unspecified: Secondary | ICD-10-CM | POA: Diagnosis not present

## 2021-01-04 DIAGNOSIS — D649 Anemia, unspecified: Secondary | ICD-10-CM | POA: Diagnosis not present

## 2021-01-04 DIAGNOSIS — E039 Hypothyroidism, unspecified: Secondary | ICD-10-CM | POA: Diagnosis not present

## 2021-01-04 DIAGNOSIS — Z1389 Encounter for screening for other disorder: Secondary | ICD-10-CM | POA: Diagnosis not present

## 2021-01-04 DIAGNOSIS — I639 Cerebral infarction, unspecified: Secondary | ICD-10-CM | POA: Diagnosis not present

## 2021-01-04 DIAGNOSIS — I129 Hypertensive chronic kidney disease with stage 1 through stage 4 chronic kidney disease, or unspecified chronic kidney disease: Secondary | ICD-10-CM | POA: Diagnosis not present

## 2021-01-24 DIAGNOSIS — I129 Hypertensive chronic kidney disease with stage 1 through stage 4 chronic kidney disease, or unspecified chronic kidney disease: Secondary | ICD-10-CM | POA: Diagnosis not present

## 2021-01-24 DIAGNOSIS — J449 Chronic obstructive pulmonary disease, unspecified: Secondary | ICD-10-CM | POA: Diagnosis not present

## 2021-01-24 DIAGNOSIS — N1832 Chronic kidney disease, stage 3b: Secondary | ICD-10-CM | POA: Diagnosis not present

## 2021-03-22 DIAGNOSIS — I129 Hypertensive chronic kidney disease with stage 1 through stage 4 chronic kidney disease, or unspecified chronic kidney disease: Secondary | ICD-10-CM | POA: Diagnosis not present

## 2021-03-22 DIAGNOSIS — Z23 Encounter for immunization: Secondary | ICD-10-CM | POA: Diagnosis not present

## 2021-03-22 DIAGNOSIS — I2510 Atherosclerotic heart disease of native coronary artery without angina pectoris: Secondary | ICD-10-CM | POA: Diagnosis not present

## 2021-03-22 DIAGNOSIS — E785 Hyperlipidemia, unspecified: Secondary | ICD-10-CM | POA: Diagnosis not present

## 2021-03-22 DIAGNOSIS — N1832 Chronic kidney disease, stage 3b: Secondary | ICD-10-CM | POA: Diagnosis not present

## 2021-03-22 DIAGNOSIS — Z720 Tobacco use: Secondary | ICD-10-CM | POA: Diagnosis not present

## 2021-03-22 DIAGNOSIS — G629 Polyneuropathy, unspecified: Secondary | ICD-10-CM | POA: Diagnosis not present

## 2021-03-22 DIAGNOSIS — R809 Proteinuria, unspecified: Secondary | ICD-10-CM | POA: Diagnosis not present

## 2021-03-22 DIAGNOSIS — J449 Chronic obstructive pulmonary disease, unspecified: Secondary | ICD-10-CM | POA: Diagnosis not present

## 2021-03-25 ENCOUNTER — Telehealth: Payer: Self-pay

## 2021-03-25 NOTE — Telephone Encounter (Signed)
-----   Message from Merlene Laughter, RN sent at 03/25/2021  9:58 AM EDT ----- Regarding: NEED TO GET SCHEDULED WITH NEW CARDIOLOGIST Juluis Rainier

## 2021-03-25 NOTE — Telephone Encounter (Signed)
Tried both number no answer - patient needs appt with new card.

## 2021-04-08 NOTE — Telephone Encounter (Signed)
No answer no vm - trying to get patient schedule for appt

## 2021-04-19 DIAGNOSIS — I129 Hypertensive chronic kidney disease with stage 1 through stage 4 chronic kidney disease, or unspecified chronic kidney disease: Secondary | ICD-10-CM | POA: Diagnosis not present

## 2021-04-19 DIAGNOSIS — N1832 Chronic kidney disease, stage 3b: Secondary | ICD-10-CM | POA: Diagnosis not present

## 2021-04-19 DIAGNOSIS — N390 Urinary tract infection, site not specified: Secondary | ICD-10-CM | POA: Diagnosis not present

## 2021-04-20 DIAGNOSIS — E039 Hypothyroidism, unspecified: Secondary | ICD-10-CM | POA: Diagnosis not present

## 2021-04-20 DIAGNOSIS — I10 Essential (primary) hypertension: Secondary | ICD-10-CM | POA: Diagnosis not present

## 2021-07-10 ENCOUNTER — Emergency Department (HOSPITAL_COMMUNITY): Payer: Medicare HMO

## 2021-07-10 ENCOUNTER — Encounter (HOSPITAL_COMMUNITY): Payer: Self-pay

## 2021-07-10 ENCOUNTER — Inpatient Hospital Stay (HOSPITAL_COMMUNITY)
Admission: EM | Admit: 2021-07-10 | Discharge: 2021-07-16 | DRG: 872 | Disposition: A | Discharge status: Skilled Nursing Facility | Payer: Medicare HMO | Attending: Internal Medicine | Admitting: Internal Medicine

## 2021-07-10 ENCOUNTER — Other Ambulatory Visit: Payer: Self-pay

## 2021-07-10 DIAGNOSIS — Z79899 Other long term (current) drug therapy: Secondary | ICD-10-CM

## 2021-07-10 DIAGNOSIS — B951 Streptococcus, group B, as the cause of diseases classified elsewhere: Secondary | ICD-10-CM | POA: Diagnosis present

## 2021-07-10 DIAGNOSIS — N179 Acute kidney failure, unspecified: Secondary | ICD-10-CM | POA: Diagnosis present

## 2021-07-10 DIAGNOSIS — R7881 Bacteremia: Secondary | ICD-10-CM | POA: Diagnosis not present

## 2021-07-10 DIAGNOSIS — I129 Hypertensive chronic kidney disease with stage 1 through stage 4 chronic kidney disease, or unspecified chronic kidney disease: Secondary | ICD-10-CM | POA: Diagnosis present

## 2021-07-10 DIAGNOSIS — Z6831 Body mass index (BMI) 31.0-31.9, adult: Secondary | ICD-10-CM | POA: Diagnosis not present

## 2021-07-10 DIAGNOSIS — I2510 Atherosclerotic heart disease of native coronary artery without angina pectoris: Secondary | ICD-10-CM | POA: Diagnosis not present

## 2021-07-10 DIAGNOSIS — E440 Moderate protein-calorie malnutrition: Secondary | ICD-10-CM | POA: Diagnosis not present

## 2021-07-10 DIAGNOSIS — N1832 Chronic kidney disease, stage 3b: Secondary | ICD-10-CM | POA: Diagnosis present

## 2021-07-10 DIAGNOSIS — W19XXXA Unspecified fall, initial encounter: Secondary | ICD-10-CM | POA: Diagnosis present

## 2021-07-10 DIAGNOSIS — J449 Chronic obstructive pulmonary disease, unspecified: Secondary | ICD-10-CM | POA: Diagnosis present

## 2021-07-10 DIAGNOSIS — K5900 Constipation, unspecified: Secondary | ICD-10-CM | POA: Diagnosis not present

## 2021-07-10 DIAGNOSIS — Z743 Need for continuous supervision: Secondary | ICD-10-CM | POA: Diagnosis not present

## 2021-07-10 DIAGNOSIS — E876 Hypokalemia: Secondary | ICD-10-CM | POA: Diagnosis not present

## 2021-07-10 DIAGNOSIS — E782 Mixed hyperlipidemia: Secondary | ICD-10-CM | POA: Diagnosis present

## 2021-07-10 DIAGNOSIS — Z66 Do not resuscitate: Secondary | ICD-10-CM | POA: Diagnosis present

## 2021-07-10 DIAGNOSIS — N390 Urinary tract infection, site not specified: Secondary | ICD-10-CM | POA: Diagnosis present

## 2021-07-10 DIAGNOSIS — F32A Depression, unspecified: Secondary | ICD-10-CM | POA: Diagnosis present

## 2021-07-10 DIAGNOSIS — Z23 Encounter for immunization: Secondary | ICD-10-CM

## 2021-07-10 DIAGNOSIS — E860 Dehydration: Secondary | ICD-10-CM | POA: Diagnosis present

## 2021-07-10 DIAGNOSIS — I69354 Hemiplegia and hemiparesis following cerebral infarction affecting left non-dominant side: Secondary | ICD-10-CM | POA: Diagnosis not present

## 2021-07-10 DIAGNOSIS — I6381 Other cerebral infarction due to occlusion or stenosis of small artery: Secondary | ICD-10-CM | POA: Diagnosis not present

## 2021-07-10 DIAGNOSIS — R7401 Elevation of levels of liver transaminase levels: Secondary | ICD-10-CM | POA: Diagnosis present

## 2021-07-10 DIAGNOSIS — A409 Streptococcal sepsis, unspecified: Secondary | ICD-10-CM | POA: Diagnosis not present

## 2021-07-10 DIAGNOSIS — F1011 Alcohol abuse, in remission: Secondary | ICD-10-CM | POA: Diagnosis present

## 2021-07-10 DIAGNOSIS — I2583 Coronary atherosclerosis due to lipid rich plaque: Secondary | ICD-10-CM | POA: Diagnosis not present

## 2021-07-10 DIAGNOSIS — Z515 Encounter for palliative care: Secondary | ICD-10-CM | POA: Diagnosis not present

## 2021-07-10 DIAGNOSIS — K5641 Fecal impaction: Secondary | ICD-10-CM | POA: Diagnosis not present

## 2021-07-10 DIAGNOSIS — F17210 Nicotine dependence, cigarettes, uncomplicated: Secondary | ICD-10-CM | POA: Diagnosis present

## 2021-07-10 DIAGNOSIS — D649 Anemia, unspecified: Secondary | ICD-10-CM | POA: Diagnosis not present

## 2021-07-10 DIAGNOSIS — D72829 Elevated white blood cell count, unspecified: Secondary | ICD-10-CM | POA: Diagnosis not present

## 2021-07-10 DIAGNOSIS — R6520 Severe sepsis without septic shock: Secondary | ICD-10-CM | POA: Diagnosis not present

## 2021-07-10 DIAGNOSIS — D509 Iron deficiency anemia, unspecified: Secondary | ICD-10-CM | POA: Diagnosis present

## 2021-07-10 DIAGNOSIS — I700 Atherosclerosis of aorta: Secondary | ICD-10-CM | POA: Diagnosis not present

## 2021-07-10 DIAGNOSIS — R296 Repeated falls: Secondary | ICD-10-CM | POA: Diagnosis not present

## 2021-07-10 DIAGNOSIS — I639 Cerebral infarction, unspecified: Secondary | ICD-10-CM | POA: Diagnosis present

## 2021-07-10 DIAGNOSIS — R7989 Other specified abnormal findings of blood chemistry: Secondary | ICD-10-CM | POA: Diagnosis not present

## 2021-07-10 DIAGNOSIS — M6282 Rhabdomyolysis: Secondary | ICD-10-CM | POA: Diagnosis present

## 2021-07-10 DIAGNOSIS — R9431 Abnormal electrocardiogram [ECG] [EKG]: Secondary | ICD-10-CM | POA: Diagnosis not present

## 2021-07-10 DIAGNOSIS — Z20822 Contact with and (suspected) exposure to covid-19: Secondary | ICD-10-CM | POA: Diagnosis not present

## 2021-07-10 DIAGNOSIS — N400 Enlarged prostate without lower urinary tract symptoms: Secondary | ICD-10-CM | POA: Diagnosis present

## 2021-07-10 DIAGNOSIS — R627 Adult failure to thrive: Secondary | ICD-10-CM | POA: Diagnosis not present

## 2021-07-10 DIAGNOSIS — E871 Hypo-osmolality and hyponatremia: Secondary | ICD-10-CM | POA: Diagnosis not present

## 2021-07-10 DIAGNOSIS — B954 Other streptococcus as the cause of diseases classified elsewhere: Secondary | ICD-10-CM | POA: Diagnosis not present

## 2021-07-10 DIAGNOSIS — R54 Age-related physical debility: Secondary | ICD-10-CM | POA: Diagnosis present

## 2021-07-10 DIAGNOSIS — A419 Sepsis, unspecified organism: Secondary | ICD-10-CM | POA: Diagnosis not present

## 2021-07-10 DIAGNOSIS — N189 Chronic kidney disease, unspecified: Secondary | ICD-10-CM

## 2021-07-10 DIAGNOSIS — I10 Essential (primary) hypertension: Secondary | ICD-10-CM | POA: Diagnosis not present

## 2021-07-10 DIAGNOSIS — Z8673 Personal history of transient ischemic attack (TIA), and cerebral infarction without residual deficits: Secondary | ICD-10-CM | POA: Diagnosis not present

## 2021-07-10 DIAGNOSIS — F419 Anxiety disorder, unspecified: Secondary | ICD-10-CM | POA: Diagnosis present

## 2021-07-10 DIAGNOSIS — R5381 Other malaise: Secondary | ICD-10-CM | POA: Diagnosis not present

## 2021-07-10 DIAGNOSIS — B9620 Unspecified Escherichia coli [E. coli] as the cause of diseases classified elsewhere: Secondary | ICD-10-CM | POA: Diagnosis not present

## 2021-07-10 DIAGNOSIS — R531 Weakness: Secondary | ICD-10-CM | POA: Diagnosis not present

## 2021-07-10 DIAGNOSIS — Z7902 Long term (current) use of antithrombotics/antiplatelets: Secondary | ICD-10-CM

## 2021-07-10 DIAGNOSIS — Z8249 Family history of ischemic heart disease and other diseases of the circulatory system: Secondary | ICD-10-CM

## 2021-07-10 DIAGNOSIS — I739 Peripheral vascular disease, unspecified: Secondary | ICD-10-CM | POA: Diagnosis not present

## 2021-07-10 DIAGNOSIS — Z885 Allergy status to narcotic agent status: Secondary | ICD-10-CM

## 2021-07-10 DIAGNOSIS — R059 Cough, unspecified: Secondary | ICD-10-CM | POA: Diagnosis not present

## 2021-07-10 DIAGNOSIS — Z043 Encounter for examination and observation following other accident: Secondary | ICD-10-CM | POA: Diagnosis not present

## 2021-07-10 DIAGNOSIS — G8929 Other chronic pain: Secondary | ICD-10-CM | POA: Diagnosis present

## 2021-07-10 DIAGNOSIS — Z7982 Long term (current) use of aspirin: Secondary | ICD-10-CM

## 2021-07-10 DIAGNOSIS — T796XXA Traumatic ischemia of muscle, initial encounter: Secondary | ICD-10-CM | POA: Diagnosis present

## 2021-07-10 DIAGNOSIS — Z7189 Other specified counseling: Secondary | ICD-10-CM | POA: Diagnosis not present

## 2021-07-10 LAB — LACTIC ACID, PLASMA: Lactic Acid, Venous: 1.8 mmol/L (ref 0.5–1.9)

## 2021-07-10 LAB — URINALYSIS, ROUTINE W REFLEX MICROSCOPIC
Bilirubin Urine: NEGATIVE
Glucose, UA: NEGATIVE mg/dL
Ketones, ur: NEGATIVE mg/dL
Nitrite: NEGATIVE
Protein, ur: 100 mg/dL — AB
Specific Gravity, Urine: 1.015 (ref 1.005–1.030)
pH: 6 (ref 5.0–8.0)

## 2021-07-10 LAB — URINALYSIS, MICROSCOPIC (REFLEX)

## 2021-07-10 LAB — BLOOD GAS, VENOUS
Acid-base deficit: 8.4 mmol/L — ABNORMAL HIGH (ref 0.0–2.0)
Bicarbonate: 17 mmol/L — ABNORMAL LOW (ref 20.0–28.0)
Drawn by: 1517
FIO2: 21
O2 Saturation: 48 %
Patient temperature: 36.7
pCO2, Ven: 37 mmHg — ABNORMAL LOW (ref 44.0–60.0)
pH, Ven: 7.284 (ref 7.250–7.430)
pO2, Ven: 33.6 mmHg (ref 32.0–45.0)

## 2021-07-10 LAB — CBC WITH DIFFERENTIAL/PLATELET
Abs Immature Granulocytes: 0.12 10*3/uL — ABNORMAL HIGH (ref 0.00–0.07)
Basophils Absolute: 0 10*3/uL (ref 0.0–0.1)
Basophils Relative: 0 %
Eosinophils Absolute: 0 10*3/uL (ref 0.0–0.5)
Eosinophils Relative: 0 %
HCT: 26.6 % — ABNORMAL LOW (ref 39.0–52.0)
Hemoglobin: 8.3 g/dL — ABNORMAL LOW (ref 13.0–17.0)
Immature Granulocytes: 1 %
Lymphocytes Relative: 2 %
Lymphs Abs: 0.5 10*3/uL — ABNORMAL LOW (ref 0.7–4.0)
MCH: 24.5 pg — ABNORMAL LOW (ref 26.0–34.0)
MCHC: 31.2 g/dL (ref 30.0–36.0)
MCV: 78.5 fL — ABNORMAL LOW (ref 80.0–100.0)
Monocytes Absolute: 1.3 10*3/uL — ABNORMAL HIGH (ref 0.1–1.0)
Monocytes Relative: 6 %
Neutro Abs: 20.9 10*3/uL — ABNORMAL HIGH (ref 1.7–7.7)
Neutrophils Relative %: 91 %
Platelets: 363 10*3/uL (ref 150–400)
RBC: 3.39 MIL/uL — ABNORMAL LOW (ref 4.22–5.81)
RDW: 18.8 % — ABNORMAL HIGH (ref 11.5–15.5)
WBC: 22.8 10*3/uL — ABNORMAL HIGH (ref 4.0–10.5)
nRBC: 0 % (ref 0.0–0.2)

## 2021-07-10 LAB — COMPREHENSIVE METABOLIC PANEL
ALT: 161 U/L — ABNORMAL HIGH (ref 0–44)
AST: 166 U/L — ABNORMAL HIGH (ref 15–41)
Albumin: 2.4 g/dL — ABNORMAL LOW (ref 3.5–5.0)
Alkaline Phosphatase: 83 U/L (ref 38–126)
Anion gap: 12 (ref 5–15)
BUN: 39 mg/dL — ABNORMAL HIGH (ref 8–23)
CO2: 17 mmol/L — ABNORMAL LOW (ref 22–32)
Calcium: 8.2 mg/dL — ABNORMAL LOW (ref 8.9–10.3)
Chloride: 96 mmol/L — ABNORMAL LOW (ref 98–111)
Creatinine, Ser: 3.08 mg/dL — ABNORMAL HIGH (ref 0.61–1.24)
GFR, Estimated: 22 mL/min — ABNORMAL LOW (ref >60)
Glucose, Bld: 95 mg/dL (ref 70–99)
Potassium: 2.2 mmol/L — CL (ref 3.5–5.1)
Sodium: 125 mmol/L — ABNORMAL LOW (ref 135–145)
Total Bilirubin: 0.6 mg/dL (ref 0.3–1.2)
Total Protein: 6.4 g/dL — ABNORMAL LOW (ref 6.5–8.1)

## 2021-07-10 LAB — RAPID URINE DRUG SCREEN, HOSP PERFORMED
Amphetamines: NOT DETECTED
Barbiturates: NOT DETECTED
Benzodiazepines: POSITIVE — AB
Cocaine: NOT DETECTED
Opiates: NOT DETECTED
Tetrahydrocannabinol: NOT DETECTED

## 2021-07-10 LAB — CK: Total CK: 3313 U/L — ABNORMAL HIGH (ref 49–397)

## 2021-07-10 LAB — PROTIME-INR
INR: 1.1 (ref 0.8–1.2)
Prothrombin Time: 14.5 seconds (ref 11.4–15.2)

## 2021-07-10 LAB — RESP PANEL BY RT-PCR (FLU A&B, COVID) ARPGX2
Influenza A by PCR: NEGATIVE
Influenza B by PCR: NEGATIVE
SARS Coronavirus 2 by RT PCR: NEGATIVE

## 2021-07-10 LAB — ETHANOL: Alcohol, Ethyl (B): <10 mg/dL (ref <10)

## 2021-07-10 LAB — CBG MONITORING, ED: Glucose-Capillary: 95 mg/dL (ref 70–99)

## 2021-07-10 LAB — APTT: aPTT: 36 seconds (ref 24–36)

## 2021-07-10 LAB — AMMONIA: Ammonia: 15 umol/L (ref 9–35)

## 2021-07-10 LAB — MAGNESIUM: Magnesium: 1.6 mg/dL — ABNORMAL LOW (ref 1.7–2.4)

## 2021-07-10 LAB — VITAMIN B12: Vitamin B-12: 689 pg/mL (ref 180–914)

## 2021-07-10 MED ORDER — ENOXAPARIN SODIUM 30 MG/0.3ML IJ SOSY
30.0000 mg | PREFILLED_SYRINGE | INTRAMUSCULAR | Status: DC
  Administered 2021-07-11: 30 mg via SUBCUTANEOUS
  Filled 2021-07-10: qty 0.3

## 2021-07-10 MED ORDER — SODIUM CHLORIDE 0.9 % IV SOLN: INTRAVENOUS | Status: DC

## 2021-07-10 MED ORDER — MAGNESIUM SULFATE 2 GM/50ML IV SOLN
2.0000 g | INTRAVENOUS | Status: AC
  Administered 2021-07-10: 2 g via INTRAVENOUS
  Filled 2021-07-10: qty 50

## 2021-07-10 MED ORDER — SODIUM CHLORIDE 0.9 % IV SOLN
2.0000 g | INTRAVENOUS | Status: AC
  Administered 2021-07-10 – 2021-07-13 (×4): 2 g via INTRAVENOUS
  Filled 2021-07-10 (×4): qty 20

## 2021-07-10 MED ORDER — POTASSIUM CHLORIDE CRYS ER 20 MEQ PO TBCR
40.0000 meq | EXTENDED_RELEASE_TABLET | Freq: Once | ORAL | Status: AC
  Administered 2021-07-11: 40 meq via ORAL
  Filled 2021-07-10: qty 2

## 2021-07-10 MED ORDER — SODIUM CHLORIDE 0.9 % IV BOLUS (SEPSIS)
1000.0000 mL | Freq: Once | INTRAVENOUS | Status: AC
  Administered 2021-07-10: 1000 mL via INTRAVENOUS

## 2021-07-10 MED ORDER — POTASSIUM CHLORIDE 10 MEQ/100ML IV SOLN
10.0000 meq | Freq: Once | INTRAVENOUS | Status: AC
  Administered 2021-07-10: 10 meq via INTRAVENOUS
  Filled 2021-07-10: qty 100

## 2021-07-10 MED ORDER — SODIUM CHLORIDE 0.9 % IV BOLUS
1000.0000 mL | Freq: Once | INTRAVENOUS | Status: AC
  Administered 2021-07-10: 1000 mL via INTRAVENOUS

## 2021-07-10 MED ORDER — ENSURE ENLIVE PO LIQD
237.0000 mL | Freq: Two times a day (BID) | ORAL | Status: DC
  Administered 2021-07-11 (×2): 237 mL via ORAL

## 2021-07-10 MED ORDER — SODIUM CHLORIDE 0.9 % IV BOLUS (SEPSIS)
500.0000 mL | Freq: Once | INTRAVENOUS | Status: AC
  Administered 2021-07-10: 500 mL via INTRAVENOUS

## 2021-07-10 MED ORDER — POTASSIUM CHLORIDE IN NACL 40-0.9 MEQ/L-% IV SOLN: INTRAVENOUS | Status: DC

## 2021-07-10 NOTE — ED Provider Notes (Signed)
[facility] EMERGENCY DEPARTMENT Provider Note   CSN: [id number] Arrival date & time: 07/10/21  2028     History  Chief Complaint  Patient presents with   Fall    Generalized weakness    George Valdez is a 66 y.o. male.  HPI  This patient is a 66 year old male, he has multiple medical problems including hypertension, he has a history of alcohol abuse but states he stopped drinking about 5 years ago.  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  The patient has some lung disease and is on Spiriva and Proventil.  Paramedics bring the patient into the hospital as well as his wife who was brought in as a separate patient.  They both were found in severely debilitated condition without having eaten in several days after having multiple falls.  He was on the floor, he had been on the floor for an undisclosed amount of time, they put him in his chair while they took his wife to the hospital but when the paramedics went back to the house he was back on the floor again.  The patient states that his only complaint is weakness.  He is very slow to answer questions, his vital signs did not reveal fever or hypoxia.  The patient does not use oxygen at home.  This is been going on for several days, it appears that he has had some degree of failure to thrive over time however he does not have any recent ER visits or admissions to the hospital.  Home Medications Prior to Admission medications   Medication Sig Start Date End Date Taking? Authorizing Provider  albuterol (PROVENTIL HFA;VENTOLIN HFA) 108 (90 BASE) MCG/ACT inhaler Inhale 2 puffs into the lungs every 6 (six) hours as needed for wheezing or shortness of breath.    [provider]  aspirin EC 81 MG tablet Take 81 mg by mouth daily. Reported on 11/16/2015    [provider]  chlordiazePOXIDE (LIBRIUM) 5 MG capsule Take 1 capsule (5 mg total)  by mouth 2 (two) times daily as needed for anxiety. 08/23/16   Kathie Dike, MD  clopidogrel (PLAVIX) 75 MG tablet Take 75 mg by mouth daily.    [provider]  cyanocobalamin (,VITAMIN B-12,) 1000 MCG/ML injection Inject 1,000 mcg into the muscle every 30 (thirty) days.    [provider]  DULoxetine (CYMBALTA) 60 MG capsule Take 60 mg by mouth daily.    [provider]  ezetimibe (ZETIA) 10 MG tablet Take 1 tablet (10 mg total) by mouth daily. 10/07/19 01/05/20  Herminio Commons, MD  folic acid (FOLVITE) 1 MG tablet Take 1 tablet (1 mg total) by mouth daily. 08/04/16   Isaac Bliss, Rayford Halsted, MD  gabapentin (NEURONTIN) 800 MG tablet Take 800 mg by mouth 3 (three) times daily.    [provider]  HYDROcodone-acetaminophen (NORCO/VICODIN) 5-325 MG tablet Take 1 tablet by mouth every 6 (six) hours as needed for moderate pain.    [provider]  isosorbide mononitrate (ISMO,MONOKET) 20 MG tablet Take 10 mg by mouth daily.    [provider]  metoprolol succinate (TOPROL-XL) 50 MG 24 hr tablet Take 50 mg by mouth 2 (two) times daily. Take with or immediately following a meal.    [provider]  Multiple Vitamin (MULTIVITAMIN WITH MINERALS) TABS tablet Take 1 tablet by mouth daily. 09/12/14  Samuella Cota, MD  potassium chloride 20 MEQ TBCR Take 20 mEq by mouth daily. 08/23/16   Kathie Dike, MD  rosuvastatin (CRESTOR) 40 MG tablet Take 40 mg by mouth daily.    [provider]  thiamine 100 MG tablet Take 1 tablet (100 mg total) by mouth daily. 12/03/15   Orvan Falconer, MD  tiotropium (SPIRIVA) 18 MCG inhalation capsule Place 18 mcg into inhaler and inhale daily.     [provider]  vitamin E 400 UNIT capsule Take 400 Units by mouth daily.    [provider]      Allergies    Codeine    Review of Systems   Review of Systems  All other systems reviewed and are negative.  Physical Exam Updated  Vital Signs BP (!) 125/54    Pulse 63    Temp 98 F (36.7 C) (Oral)    Resp (!) 21    Ht 1.6 m (5\' 3" )    Wt 79.8 kg    SpO2 95%    BMI 31.16 kg/m  Physical Exam Vitals and nursing note reviewed.  Constitutional:      General: He is not in acute distress.    Appearance: He is well-developed.  HENT:     Head: Normocephalic and atraumatic.     Mouth/Throat:     Pharynx: No oropharyngeal exudate.     Comments: Extremely poor dentition throughout, missing multiple teeth, no signs of jaw swelling or tenderness around the gums Eyes:     General: No scleral icterus.       Right eye: No discharge.        Left eye: No discharge.     Conjunctiva/sclera: Conjunctivae normal.     Pupils: Pupils are equal, round, and reactive to light.  Neck:     Thyroid: No thyromegaly.     Vascular: No JVD.  Cardiovascular:     Rate and Rhythm: Normal rate and regular rhythm.     Heart sounds: Normal heart sounds. No murmur heard.   No friction rub. No gallop.  Pulmonary:     Effort: Pulmonary effort is normal. No respiratory distress.     Breath sounds: Normal breath sounds. No wheezing or rales.  Abdominal:     General: Bowel sounds are normal. There is no distension.     Palpations: Abdomen is soft. There is no mass.     Tenderness: There is no abdominal tenderness.  Musculoskeletal:        General: No tenderness. Normal range of motion.     Cervical back: Normal range of motion and neck supple.     Right lower leg: Edema present.     Left lower leg: Edema present.     Comments: Symmetrical 1+ pitting edema bilaterally  Lymphadenopathy:     Cervical: No cervical adenopathy.  Skin:    General: Skin is warm and dry.     Findings: Bruising present. No erythema or rash.     Comments: Multiple abrasions and skin tears across the body  Neurological:     General: No focal deficit present.     Mental Status: He is alert.     Coordination: Coordination normal.     Comments: The patient is awake and  alert but able to follow commands very weakly, bilateral hands are equal in strength but generally weak, he cannot lift either leg off of the bed.  Cranial nerves III through XII appear to be intact  Psychiatric:  Behavior: Behavior normal.    ED Results / Procedures / Treatments   Labs (all labs ordered are listed, but only abnormal results are displayed) Labs Reviewed  COMPREHENSIVE METABOLIC PANEL - Abnormal; Notable for the following components:      Result Value   Sodium 125 (*)    Potassium 2.2 (*)    Chloride 96 (*)    CO2 17 (*)    BUN 39 (*)    Creatinine, Ser 3.08 (*)    Calcium 8.2 (*)    Total Protein 6.4 (*)    Albumin 2.4 (*)    AST 166 (*)    ALT 161 (*)    GFR, Estimated 22 (*)    All other components within normal limits  CBC WITH DIFFERENTIAL/PLATELET - Abnormal; Notable for the following components:   WBC 22.8 (*)    RBC 3.39 (*)    Hemoglobin 8.3 (*)    HCT 26.6 (*)    MCV 78.5 (*)    MCH 24.5 (*)    RDW 18.8 (*)    Neutro Abs 20.9 (*)    Lymphs Abs 0.5 (*)    Monocytes Absolute 1.3 (*)    Abs Immature Granulocytes 0.12 (*)    All other components within normal limits  URINALYSIS, ROUTINE W REFLEX MICROSCOPIC - Abnormal; Notable for the following components:   Hgb urine dipstick LARGE (*)    Protein, ur 100 (*)    Leukocytes,Ua SMALL (*)    All other components within normal limits  BLOOD GAS, VENOUS - Abnormal; Notable for the following components:   pCO2, Ven 37.0 (*)    Bicarbonate 17.0 (*)    Acid-base deficit 8.4 (*)    All other components within normal limits  RAPID URINE DRUG SCREEN, HOSP PERFORMED - Abnormal; Notable for the following components:   Benzodiazepines POSITIVE (*)    All other components within normal limits  MAGNESIUM - Abnormal; Notable for the following components:   Magnesium 1.6 (*)    All other components within normal limits  CK - Abnormal; Notable for the following components:   Total CK 3,313 (*)    All  other components within normal limits  URINALYSIS, MICROSCOPIC (REFLEX) - Abnormal; Notable for the following components:   Bacteria, UA MANY (*)    All other components within normal limits  CULTURE, BLOOD (ROUTINE X 2)  CULTURE, BLOOD (ROUTINE X 2)  URINE CULTURE  RESP PANEL BY RT-PCR (FLU A&B, COVID) ARPGX2  AMMONIA  LACTIC ACID, PLASMA  ETHANOL  PROTIME-INR  APTT  VITAMIN B12  VITAMIN B1  CBG MONITORING, ED  CBG MONITORING, ED    EKG EKG Interpretation  Date/Time:  Saturday July 10 2021 20:45:36 EST Ventricular Rate:  90 PR Interval:  169 QRS Duration: 122 QT Interval:  387 QTC Calculation: 474 R Axis:   -59 Text Interpretation: Sinus rhythm Nonspecific IVCD with LAD LVH with secondary repolarization abnormality Since last tracing QRS is widened Confirmed by Noemi Chapel [id number]) on 07/10/2021 10:08:54 PM  Radiology CT Head Wo Contrast  Result Date: 07/10/2021 CLINICAL DATA:  Recent fall with weakness, initial encounter EXAM: CT HEAD WITHOUT CONTRAST TECHNIQUE: Contiguous axial images were obtained from the base of the skull through the vertex without intravenous contrast. RADIATION DOSE REDUCTION: This exam was performed according to the departmental dose-optimization program which includes automated exposure control, adjustment of the mA and/or kV according to patient size and/or use of iterative reconstruction technique. COMPARISON:  08/22/2016 FINDINGS: Brain: No  evidence of acute infarction, hemorrhage, hydrocephalus, extra-axial collection or mass lesion/mass effect. 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 Scattered lacunar infarcts in the basal ganglia bilaterally are again noted. Vascular: No hyperdense vessel or unexpected calcification. Skull: Normal. Negative for fracture or focal lesion. Sinuses/Orbits: No acute finding. Other: None. IMPRESSION: Chronic  ischemic changes without acute abnormality.  Electronically Signed   By: [name] M.D.   On: 07/10/2021 21:10   DG Chest Port 1 View  Result Date: 07/10/2021 CLINICAL DATA:  cough, fall EXAM: PORTABLE CHEST 1 VIEW COMPARISON:  Chest x-ray 08/22/2016 FINDINGS: The heart and mediastinal contours are unchanged. Aortic calcification No focal consolidation. No pulmonary edema. No pleural effusion. No pneumothorax. No acute osseous abnormality. IMPRESSION: No active disease. Electronically Signed   By: Iven Finn M.D.   On: 07/10/2021 21:11    Procedures .Critical Care Performed by: Noemi Chapel, MD Authorized by: Noemi Chapel, MD   Critical care provider statement:    Critical care time (minutes):  30   Critical care time was exclusive of:  Separately billable procedures and treating other patients and teaching time   Critical care was necessary to treat or prevent imminent or life-threatening deterioration of the following conditions:  Sepsis, renal failure and dehydration   Critical care was time spent personally by me on the following activities:  Development of treatment plan with patient or surrogate, discussions with consultants, evaluation of patient's response to treatment, examination of patient, ordering and review of laboratory studies, ordering and review of radiographic studies, ordering and performing treatments and interventions, pulse oximetry, re-evaluation of patient's condition, review of old charts and obtaining history from patient or surrogate   I assumed direction of critical care for this patient from another provider in my specialty: no     Care discussed with: admitting provider   Comments:          Medications Ordered in ED Medications  sodium chloride 0.9 % bolus 1,000 mL (0 mLs Intravenous Stopped 07/10/21 2210)    And  0.9 %  sodium chloride infusion (has no administration in time range)  0.9 %  sodium chloride infusion (has no administration in time range)  cefTRIAXone (ROCEPHIN) 2 g in sodium  chloride 0.9 % 100 mL IVPB (2 g Intravenous New Bag/Given 07/10/21 2122)  magnesium sulfate IVPB 2 g 50 mL (2 g Intravenous New Bag/Given 07/10/21 2209)  potassium chloride 10 mEq in 100 mL IVPB (10 mEq Intravenous New Bag/Given 07/10/21 2209)  sodium chloride 0.9 % bolus 1,000 mL (1,000 mLs Intravenous New Bag/Given 07/10/21 2123)    And  sodium chloride 0.9 % bolus 1,000 mL (1,000 mLs Intravenous New Bag/Given 07/10/21 2124)    And  sodium chloride 0.9 % bolus 500 mL (500 mLs Intravenous New Bag/Given 07/10/21 2124)    ED Course/ Medical Decision Making/ A&P                           Medical Decision Making The patient presents with severe and progressive generalized weakness with multiple falls and the inability to take care of himself at home.  His wife is also been admitted to the hospital with severe failure to thrive and dehydration.  The patient's blood pressure was just measured less than 80 systolic.  He will be given IV fluids, we will check blood cultures as well as some labs to make sure he is not septic  however given that both of them were both poorly taken care of over the last week I suspect this is just dehydration.  Rule out rhabdomyolysis, endorgan damage, pneumonia and any signs of intracranial abnormality such as hemorrhage or stroke.  The patient does appear very ill today  Problems Addressed: Acute renal failure, unspecified acute renal failure type [facility]): acute illness or injury    Details: Aggressive IV rehydration and resuscitation Anemia, unspecified type: acute illness or injury    Details: Monitored, no acute intervention Hypokalemia: acute illness or injury    Details: Replacement with magnesium and potassium, no signs of QT prolongation Hyponatremia: acute illness or injury    Details: IV fluids Severe sepsis ([place]): acute illness or injury    Details: Antibiotics and IV fluid Traumatic rhabdomyolysis, initial encounter [facility]): acute illness or injury    Details: IV  fluid  Amount and/or Complexity of Data Reviewed Independent Historian: caregiver, spouse and EMS    Details: The patient has been having failure to thrive over the last week, EMS reports that they found him on the ground, he fell several times today External Data Reviewed: notes.    Details: Reviewed multiple visits to family doctor for hypertension, no recent admissions to the hospital Labs: ordered.    Details: I have viewed and interpreted the laboratory data which shows that the patient is severely hypokalemic, he is in acute renal failure with a creatinine of 3, he is in rhabdomyolysis with a CK over 3000 and he has a white blood cell count over 20,000 suggestive of a sepsis syndrome especially given his hypotension. Radiology: ordered and independent interpretation performed.    Details: No findings of pneumonia on x-ray, no pneumothorax, CT scan of the brain shows no signs of acute hemorrhage or other ischemia.  CT scan of the abdomen and pelvis was ordered to rule out causes of intra-abdominal pathology infection or sepsis ECG/medicine tests: ordered and independent interpretation performed.    Details: See my separate interpretation in the body of the note Discussion of management or test interpretation with external provider(s): Discussed hypokalemia, hyponatremia, acute renal failure, rhabdomyolysis and sepsis with actual severe sepsis because of endorgan injury.  He was given IV fluids at 30 cc/kg, antibiotics to cover for severe infection and will be admitted to the hospital to high level of care.  His blood pressure improved to a blood pressure of 125/54 at last check after being given aggressive fluid resuscitation.  His mental status is stated awake and alert, he is still very very weak which I suspect is related to his severe hypokalemia.  Critical care provided  The patient's home situation was a significant social determinant of health as this patient is debilitated and living  in a house that is not apparently clean enough to live and according to paramedic  I discussed the care with Dr. Josephine Cables who will admit the patient to the hospital to high level of care  Risk Drug therapy requiring intensive monitoring for toxicity. Decision regarding hospitalization. Diagnosis or treatment significantly limited by social determinants of health. Risk Details: The patient is at significant risk of decompensation with severe leukocytosis, signs of sepsis, signs of renal failure, signs of multiple electrolyte abnormalities and rhabdomyolysis.  Critical Care Total time providing critical care: 30-74 minutes         Final Clinical Impression(s) / ED Diagnoses Final diagnoses:  Severe sepsis ([place])  Traumatic rhabdomyolysis, initial encounter ([place])  Acute renal failure, unspecified acute renal failure type ([place])  Hypokalemia  Hyponatremia  Anemia, unspecified type      Noemi Chapel, MD 07/10/21 2258

## 2021-07-10 NOTE — ED Notes (Signed)
Lab at bedside getting cultures

## 2021-07-10 NOTE — ED Triage Notes (Signed)
Pt presents from home by [facility] for c/o fall and generalized weakness. Pt says he has not eaten in 3 days, pt wife was brought here earlier by EMS for the same. They live at home by themselves.

## 2021-07-10 NOTE — Progress Notes (Signed)
Elink following code sepsis °

## 2021-07-10 NOTE — H&P (Addendum)
History and Physical  George Valdez:[id number] DOB: [birth year] DOA: 07/10/2021  Referring physician: Noemi Chapel, MD PCP: Patient, No Pcp Per (Inactive)  Patient coming from: Home  Chief Complaint: Fall, generalized weakness  HPI: George Valdez is a 66 y.o. male with medical history significant for hypertension, CAD/PAD, prior stroke with subsequent right-sided weakness, hyperlipidemia, chronic disease stage III who presents to the emergency department via EMS due to fall and generalized weakness.  Patient complained of 3-day onset of increased weakness, decreased food and fluid intake.  Patient patient states that he lives at home with wife who also have similar conditions and was taken to the ED for further evaluation and management.  When EMS team first arrived to the house, he was met on the floor, he was picked up and placed on his electric chair while wife was being taken to the ED.  On return to check on patient, he was already back to the floor.  He only complained of weakness and denies chest pain, shortness of breath, fever, chills, headache, blurry vision, numbness and tingling.  ED Course:  In the emergency department, he was initially tachypneic, BP was 95/82 and other signs are within normal range.  Work-up in the ED showed leukocytosis, MCV 78.5, hyponatremia, hypokalemia, BUN to creatinine 39/3.08 (most recent creatinine for comparison was 4 years ago and it was 0.7-1.1).  Total CK 3313, magnesium 1.6, urine drug screen was positive for benzodiazepine, urinalysis was unimpressive for UTI.  Influenza A, B, SARS coronavirus 2 was negative. Chest x-ray showed no active disease CT of head without contrast showed chronic ischemic changes without acute abnormality CT abdomen and pelvis without contrast showed subcutaneous fat stranding right gluteal region which may reflect direct trauma and ecchymosis. He was treated with IV ceftriaxone due to presumed UTI, IV  hydration was provided.  Potassium was replenished, magnesium was replenished.  Hospitalist was asked to admit patient for further evaluation and management  Review of Systems: A full 10 point Review of Systems was done, except as stated above, all other Review of systems were negative.  Past Medical History:  Diagnosis Date   Alcohol abuse    Anxiety    BPH (benign prostatic hyperplasia)    Chronic pain    Coronary artery disease    Depression    Frequent falls    Hyperlipemia    Hypertension    Neuropathy    Pernicious anemia    Stroke ([place])    left sided weakness   Past Surgical History:  Procedure Laterality Date   CARDIAC CATHETERIZATION     CAROTID ENDARTERECTOMY      Social History:  reports that he has been smoking cigarettes. He has a 40.00 pack-year smoking history. He has never used smokeless tobacco. He reports current alcohol use of about 12.0 standard drinks per week. He reports that he does not use drugs.   Allergies  Allergen Reactions   Codeine Itching    Family History  Problem Relation Age of Onset   Congestive Heart Failure Mother 27   Heart attack Father 59   Congestive Heart Failure Sister      Prior to Admission medications   Medication Sig Start Date End Date Taking? Authorizing Provider  albuterol (PROVENTIL HFA;VENTOLIN HFA) 108 (90 BASE) MCG/ACT inhaler Inhale 2 puffs into the lungs every 6 (six) hours as needed for wheezing or shortness of breath.    [provider]  aspirin EC 81 MG tablet Take 81 mg by  mouth daily. Reported on 11/16/2015    [provider]  chlordiazePOXIDE (LIBRIUM) 5 MG capsule Take 1 capsule (5 mg total) by mouth 2 (two) times daily as needed for anxiety. 08/23/16   Kathie Dike, MD  clopidogrel (PLAVIX) 75 MG tablet Take 75 mg by mouth daily.    [provider]  cyanocobalamin (,VITAMIN B-12,) 1000 MCG/ML injection Inject 1,000 mcg into the muscle every 30 (thirty) days.    [provider]  DULoxetine (CYMBALTA) 60 MG capsule Take 60 mg by mouth daily.    [provider]  ezetimibe (ZETIA) 10 MG tablet Take 1 tablet (10 mg total) by mouth daily. 10/07/19 01/05/20  Herminio Commons, MD  folic acid (FOLVITE) 1 MG tablet Take 1 tablet (1 mg total) by mouth daily. 08/04/16   Isaac Bliss, Rayford Halsted, MD  gabapentin (NEURONTIN) 800 MG tablet Take 800 mg by mouth 3 (three) times daily.    [provider]  HYDROcodone-acetaminophen (NORCO/VICODIN) 5-325 MG tablet Take 1 tablet by mouth every 6 (six) hours as needed for moderate pain.    [provider]  isosorbide mononitrate (ISMO,MONOKET) 20 MG tablet Take 10 mg by mouth daily.    [provider]  metoprolol succinate (TOPROL-XL) 50 MG 24 hr tablet Take 50 mg by mouth 2 (two) times daily. Take with or immediately following a meal.    [provider]  Multiple Vitamin (MULTIVITAMIN WITH MINERALS) TABS tablet Take 1 tablet by mouth daily. 09/12/14   Samuella Cota, MD  potassium chloride 20 MEQ TBCR Take 20 mEq by mouth daily. 08/23/16   Kathie Dike, MD  rosuvastatin (CRESTOR) 40 MG tablet Take 40 mg by mouth daily.    [provider]  thiamine 100 MG tablet Take 1 tablet (100 mg total) by mouth daily. 12/03/15   Orvan Falconer, MD  tiotropium (SPIRIVA) 18 MCG inhalation capsule Place 18 mcg into inhaler and inhale daily.     [provider]  vitamin E 400 UNIT capsule Take 400 Units by mouth daily.    [provider]    Physical Exam: BP (!) 112/91 (BP Location: Right Arm)    Pulse 86    Temp 97.8 F (36.6 C) (Oral)    Resp 12    Ht '5\' 3"'  (1.6 m)    Wt 79.8 kg    SpO2 95%    BMI 31.16 kg/m   General: 66 y.o. year-old male looks older than stated age, but was in no acute distress.  Alert and oriented x3. HEENT: Poor dentition.  NCAT, EOMI Neck: Supple, trachea medial Cardiovascular: Regular rate and rhythm with no rubs or gallops.  No thyromegaly  or JVD noted.  No lower extremity edema. 2/4 pulses in all 4 extremities. Respiratory: Clear to auscultation with no wheezes or rales. Good inspiratory effort. Abdomen: Soft, nontender nondistended with normal bowel sounds x4 quadrants. Muskuloskeletal: No cyanosis, clubbing or edema noted bilaterally Neuro: CN II-XII intact, strength 5/5 x 4, sensation, reflexes intact Skin: A couple of healing abrasions across the body. Psychiatry: Mood is appropriate for condition and setting          Labs on Admission:  Basic Metabolic Panel: Recent Labs  Lab 07/10/21 2048  NA 125*  K 2.2*  CL 96*  CO2 17*  GLUCOSE 95  BUN 39*  CREATININE 3.08*  CALCIUM 8.2*  MG 1.6*   Liver Function Tests: Recent Labs  Lab 07/10/21 2048  AST 166*  ALT 161*  ALKPHOS 83  BILITOT 0.6  PROT 6.4*  ALBUMIN 2.4*   No results for input(s): LIPASE, AMYLASE in the last 168 hours. Recent Labs  Lab 07/10/21 2112  AMMONIA 15   CBC: Recent Labs  Lab 07/10/21 2048  WBC 22.8*  NEUTROABS 20.9*  HGB 8.3*  HCT 26.6*  MCV 78.5*  PLT 363   Cardiac Enzymes: Recent Labs  Lab 07/10/21 2048  CKTOTAL 3,313*    BNP (last 3 results) No results for input(s): BNP in the last 8760 hours.  ProBNP (last 3 results) No results for input(s): PROBNP in the last 8760 hours.  CBG: Recent Labs  Lab 07/10/21 2048  GLUCAP 95    Radiological Exams on Admission: CT ABDOMEN PELVIS WO CONTRAST  Result Date: 07/10/2021 CLINICAL DATA:  [place], generalized weakness EXAM: CT ABDOMEN AND PELVIS WITHOUT CONTRAST TECHNIQUE: Multidetector CT imaging of the abdomen and pelvis was performed following the standard protocol without IV contrast. Unenhanced CT was performed per clinician order. Lack of IV contrast limits sensitivity and specificity, especially for evaluation of abdominal/pelvic solid viscera. RADIATION DOSE REDUCTION: This exam was performed according to the departmental dose-optimization program which includes  automated exposure control, adjustment of the mA and/or kV according to patient size and/or use of iterative reconstruction technique. COMPARISON:  None. FINDINGS: Lower chest: Bibasilar scarring and fibrosis. No acute pleural or parenchymal lung disease. Hepatobiliary: Unremarkable unenhanced appearance of the liver and gallbladder. Pancreas: Unremarkable unenhanced appearance. Spleen: Unremarkable unenhanced appearance. Adrenals/Urinary Tract: Low-attenuation nodular thickening left adrenal gland consistent with hyperplasia or adenoma. Right adrenals unremarkable. No urinary tract calculi or obstructive uropathy within either kidney. Vascular calcifications are seen at the renal hila. The bladder is unremarkable. Stomach/Bowel: No bowel obstruction or ileus. Large amount of retained stool within the rectal vault may reflect fecal impaction. Normal appendix right lower quadrant. No bowel wall thickening or inflammatory change. Vascular/Lymphatic: Diffuse aortic atherosclerosis. Evaluation of the vascular lumen limited by the lack of IV contrast. No pathologic adenopathy. Reproductive: Prostate is not enlarged. Testicles are retracted into the distal inguinal canals bilaterally. Other: No free intraperitoneal fluid or free gas. No abdominal wall hernia. Musculoskeletal: Age-indeterminate fracture of the right fourth costochondral junction, only partially visualized on this study. Chronic right posterolateral eleventh rib fracture with moderate callus formation. No other acute bony abnormalities. Subcutaneous fat stranding in the right gluteal region may reflect direct trauma and ecchymosis. No evidence of hematoma. Reconstructed images demonstrate no additional findings. IMPRESSION: 1. Subcutaneous fat stranding right gluteal region which may reflect direct trauma and ecchymosis. 2. Age indeterminate fracture of the right fourth costochondral junction, only partially visualized on this study. Chronic right  posterolateral eleventh rib fracture. 3. Otherwise no acute intra-abdominal crash that marked retained stool within the distal colon, compatible with fecal impaction. No bowel obstruction or ileus. 4.  Aortic Atherosclerosis (ICD10-I70.0). Electronically Signed   By: Randa Ngo M.D.   On: 07/10/2021 22:37   CT Head Wo Contrast  Result Date: 07/10/2021 CLINICAL DATA:  Recent fall with weakness, initial encounter EXAM: CT HEAD WITHOUT CONTRAST TECHNIQUE: Contiguous axial images were obtained from the base of the skull through the vertex without intravenous contrast. RADIATION DOSE REDUCTION: This exam was performed according to the departmental dose-optimization program which includes automated exposure control, adjustment of the mA and/or kV according to patient size and/or use of iterative reconstruction technique. COMPARISON:  08/22/2016 FINDINGS: Brain: No evidence of acute infarction, hemorrhage, hydrocephalus, extra-axial collection or mass lesion/mass effect. Changes of prior left  cerebellar infarct are again noted and stable with encephalomalacia changes. Scattered lacunar infarcts in the basal ganglia bilaterally are again noted. Vascular: No hyperdense vessel or unexpected calcification. Skull: Normal. Negative for fracture or focal lesion. Sinuses/Orbits: No acute finding. Other: None. IMPRESSION: Chronic  ischemic changes without acute abnormality. Electronically Signed   By: [name] M.D.   On: 07/10/2021 21:10   DG Chest Port 1 View  Result Date: 07/10/2021 CLINICAL DATA:  cough, fall EXAM: PORTABLE CHEST 1 VIEW COMPARISON:  Chest x-ray 08/22/2016 FINDINGS: The heart and mediastinal contours are unchanged. Aortic calcification No focal consolidation. No pulmonary edema. No pleural effusion. No pneumothorax. No acute osseous abnormality. IMPRESSION: No active disease. Electronically Signed   By: Iven Finn M.D.   On: 07/10/2021 21:11    EKG: I independently viewed the EKG done and  my findings are as followed: Normal sinus rhythm at rate of 90 bpm  Assessment/Plan Present on Admission:  Rhabdomyolysis  Hypokalemia  CAD (coronary artery disease)  Principal Problem:   Rhabdomyolysis Active Problems:   Acute kidney injury superimposed on CKD (HCC)   Essential hypertension   CAD (coronary artery disease)   PAD (peripheral artery disease) ([facility])   Microcytic anemia   Hypokalemia   AKI (acute kidney injury) ([place])   Frequent falls   Generalized weakness   Failure to thrive in adult   Physical deconditioning   Transaminitis   Hypomagnesemia   Leukocytosis   Dehydration   Mixed hyperlipidemia   History of stroke  Rhabdomyolysis Patient was on the floor for an unknown amount of time Total CK was 3,313 Continue IV hydration and continue to monitor total CK  Frequent falls secondary to generalized weakness in the setting of failure to thrive in adult Physical deconditioning Protein supplement to be provided Nutritionist will be consulted and we shall await further recommendation Continue fall precaution and neurochecks Continue PT/OT eval and treat  Benzodiazepine use/abuse Urine drug screen was positive for benzodiazepine, but Librium was noted on patient's med rec This will be held at this time  Acute kidney injury on CKD 4/dehydration BUN to creatinine 39/3.08 (most recent creatinine for comparison was 4 years ago and it was 0.7-1.1). Continue evaluation Renally adjust medications, avoid nephrotoxic agents/dehydration/hypotension  Presumed UTI POA Patient was started on IV ceftriaxone due to suspected UTI Urinalysis was unimpressive and patient denies any irritative bladder symptoms We shall continue with ceftriaxone with plan to de-escalate/discontinue based on urine culture/procalcitonin  Hypomagnesemia Mg level is 1.6 This will be replenished Please continue to monitor Mg level and correct accordingly  Hypokalemia K+ is 2.2 K+ will be  replenished Please monitor for AM K+ for further replenishmemnt  Hyponatremia possibly due to dehydration Continue gentle hydration Continue to monitor sodium with serial BMPs Urine osmolality, serum osmolality and urine sodium will be checked  Microcytic anemia MCV 78.5, H/H 8.3/26.6 Iron studies will be done  Transaminitis AST 166, ALT 161, patient denies any abdominal pain Patient states that he already abstained from alcohol consumption since 5 years ago Continue to monitor liver enzymes  Essential hypertension BP meds will be held at this time due to soft BP  CAD/PAD/history of stroke Continue aspirin, Plavix Crestor will be held at this time due to patient's current state of rhabdomyolysis Imdur will be held at this time due to soft BP  Mixed hyperlipidemia Crestor will be held at this time due to patient's current state of rhabdomyolysis Zetia will be temporarily held at this time due  to transaminitis  Hold home meds: Albuterol, Spiriva  DVT prophylaxis: Lovenox  Code Status: Full code  Family Communication: None at bedside  Disposition Plan:  Patient is from:                        home Anticipated DC to:                   SNF or family members home Anticipated DC date:               2-3 days Anticipated DC barriers:         Patient requires inpatient management due to severity of symptoms  Consults called: None  Admission status: Inpatient    Bernadette Hoit MD [facility]  07/10/2021, 11:05 PM

## 2021-07-10 NOTE — ED Notes (Signed)
Patient transported to CT 

## 2021-07-10 NOTE — ED Notes (Signed)
CBG 95 

## 2021-07-11 DIAGNOSIS — I2583 Coronary atherosclerosis due to lipid rich plaque: Secondary | ICD-10-CM

## 2021-07-11 DIAGNOSIS — I2510 Atherosclerotic heart disease of native coronary artery without angina pectoris: Secondary | ICD-10-CM

## 2021-07-11 LAB — CBC
HCT: 22.3 % — ABNORMAL LOW (ref 39.0–52.0)
Hemoglobin: 6.7 g/dL — CL (ref 13.0–17.0)
MCH: 23.8 pg — ABNORMAL LOW (ref 26.0–34.0)
MCHC: 30 g/dL (ref 30.0–36.0)
MCV: 79.4 fL — ABNORMAL LOW (ref 80.0–100.0)
Platelets: 322 10*3/uL (ref 150–400)
RBC: 2.81 MIL/uL — ABNORMAL LOW (ref 4.22–5.81)
RDW: 18.6 % — ABNORMAL HIGH (ref 11.5–15.5)
WBC: 18.4 10*3/uL — ABNORMAL HIGH (ref 4.0–10.5)
nRBC: 0 % (ref 0.0–0.2)

## 2021-07-11 LAB — BLOOD CULTURE ID PANEL (REFLEXED) - BCID2

## 2021-07-11 LAB — OSMOLALITY: Osmolality: 284 mOsm/kg (ref 275–295)

## 2021-07-11 LAB — COMPREHENSIVE METABOLIC PANEL
ALT: 135 U/L — ABNORMAL HIGH (ref 0–44)
AST: 160 U/L — ABNORMAL HIGH (ref 15–41)
Albumin: 2 g/dL — ABNORMAL LOW (ref 3.5–5.0)
Alkaline Phosphatase: 71 U/L (ref 38–126)
Anion gap: 10 (ref 5–15)
BUN: 33 mg/dL — ABNORMAL HIGH (ref 8–23)
CO2: 16 mmol/L — ABNORMAL LOW (ref 22–32)
Calcium: 7.5 mg/dL — ABNORMAL LOW (ref 8.9–10.3)
Chloride: 106 mmol/L (ref 98–111)
Creatinine, Ser: 2.6 mg/dL — ABNORMAL HIGH (ref 0.61–1.24)
GFR, Estimated: 26 mL/min — ABNORMAL LOW (ref >60)
Glucose, Bld: 102 mg/dL — ABNORMAL HIGH (ref 70–99)
Potassium: 2.3 mmol/L — CL (ref 3.5–5.1)
Sodium: 132 mmol/L — ABNORMAL LOW (ref 135–145)
Total Bilirubin: 0.4 mg/dL (ref 0.3–1.2)
Total Protein: 5.5 g/dL — ABNORMAL LOW (ref 6.5–8.1)

## 2021-07-11 LAB — CK: Total CK: 3797 U/L — ABNORMAL HIGH (ref 49–397)

## 2021-07-11 LAB — ABO/RH: ABO/RH(D): A POS

## 2021-07-11 LAB — PROCALCITONIN: Procalcitonin: 1.12 ng/mL

## 2021-07-11 LAB — HIV ANTIBODY (ROUTINE TESTING W REFLEX): HIV Screen 4th Generation wRfx: NONREACTIVE

## 2021-07-11 LAB — IRON AND TIBC
Iron: 11 ug/dL — ABNORMAL LOW (ref 45–182)
Saturation Ratios: 4 % — ABNORMAL LOW (ref 17.9–39.5)
TIBC: 266 ug/dL (ref 250–450)
UIBC: 255 ug/dL

## 2021-07-11 LAB — MAGNESIUM: Magnesium: 2 mg/dL (ref 1.7–2.4)

## 2021-07-11 LAB — PHOSPHORUS: Phosphorus: 3.5 mg/dL (ref 2.5–4.6)

## 2021-07-11 LAB — PREPARE RBC (CROSSMATCH)

## 2021-07-11 LAB — FERRITIN: Ferritin: 124 ng/mL (ref 24–336)

## 2021-07-11 MED ORDER — CLOPIDOGREL BISULFATE 75 MG PO TABS
75.0000 mg | ORAL_TABLET | Freq: Every day | ORAL | Status: DC
  Administered 2021-07-11: 75 mg via ORAL
  Filled 2021-07-11: qty 1

## 2021-07-11 MED ORDER — MAGNESIUM SULFATE 2 GM/50ML IV SOLN
2.0000 g | Freq: Once | INTRAVENOUS | Status: AC
  Administered 2021-07-11: 2 g via INTRAVENOUS
  Filled 2021-07-11: qty 50

## 2021-07-11 MED ORDER — SODIUM CHLORIDE 0.9% IV SOLUTION
Freq: Once | INTRAVENOUS | Status: AC
Start: 2021-07-11 — End: 2021-07-11

## 2021-07-11 MED ORDER — ADULT MULTIVITAMIN W/MINERALS CH
1.0000 | ORAL_TABLET | Freq: Every day | ORAL | Status: DC
  Administered 2021-07-11 – 2021-07-16 (×6): 1 via ORAL
  Filled 2021-07-11 (×6): qty 1

## 2021-07-11 MED ORDER — ASPIRIN EC 81 MG PO TBEC
81.0000 mg | DELAYED_RELEASE_TABLET | Freq: Every day | ORAL | Status: DC
  Administered 2021-07-11 – 2021-07-16 (×6): 81 mg via ORAL
  Filled 2021-07-11 (×6): qty 1

## 2021-07-11 MED ORDER — PANTOPRAZOLE SODIUM 40 MG IV SOLR
40.0000 mg | INTRAVENOUS | Status: DC
  Administered 2021-07-11 – 2021-07-13 (×3): 40 mg via INTRAVENOUS
  Filled 2021-07-11 (×3): qty 40

## 2021-07-11 MED ORDER — ENSURE ENLIVE PO LIQD
237.0000 mL | Freq: Three times a day (TID) | ORAL | Status: DC
  Administered 2021-07-12 – 2021-07-13 (×3): 237 mL via ORAL

## 2021-07-11 MED ORDER — TIOTROPIUM BROMIDE MONOHYDRATE 18 MCG IN CAPS: 18.0000 ug | ORAL_CAPSULE | Freq: Every day | RESPIRATORY_TRACT | Status: DC

## 2021-07-11 MED ORDER — FUROSEMIDE 10 MG/ML IJ SOLN
40.0000 mg | Freq: Once | INTRAMUSCULAR | Status: AC
  Administered 2021-07-11: 40 mg via INTRAVENOUS
  Filled 2021-07-11: qty 4

## 2021-07-11 MED ORDER — ALBUTEROL SULFATE (2.5 MG/3ML) 0.083% IN NEBU: 3.0000 mL | INHALATION_SOLUTION | Freq: Four times a day (QID) | RESPIRATORY_TRACT | Status: DC | PRN

## 2021-07-11 MED ORDER — KCL IN DEXTROSE-NACL 20-5-0.9 MEQ/L-%-% IV SOLN
INTRAVENOUS | Status: DC
  Administered 2021-07-11: 20:00:00 800 mL via INTRAVENOUS

## 2021-07-11 MED ORDER — POTASSIUM CHLORIDE CRYS ER 20 MEQ PO TBCR
40.0000 meq | EXTENDED_RELEASE_TABLET | ORAL | Status: AC
  Administered 2021-07-11 (×2): 40 meq via ORAL
  Filled 2021-07-11 (×2): qty 2

## 2021-07-11 MED ORDER — ALPRAZOLAM 0.5 MG PO TABS
0.5000 mg | ORAL_TABLET | Freq: Two times a day (BID) | ORAL | Status: DC | PRN
  Administered 2021-07-11 – 2021-07-15 (×7): 0.5 mg via ORAL
  Filled 2021-07-11 (×7): qty 1

## 2021-07-11 MED ORDER — UMECLIDINIUM BROMIDE 62.5 MCG/ACT IN AEPB
1.0000 | INHALATION_SPRAY | Freq: Every day | RESPIRATORY_TRACT | Status: DC
  Administered 2021-07-12 – 2021-07-16 (×5): 1 via RESPIRATORY_TRACT
  Filled 2021-07-11: qty 7

## 2021-07-11 NOTE — Progress Notes (Signed)
PROGRESS NOTE     George Valdez, is a 66 y.o. male, DOB - 12-19-1955, WOE:[id number]  Admit date - 07/10/2021   Admitting Physician Bernadette Hoit, DO  Outpatient Primary MD for the George Valdez is George Valdez, No Pcp Per (Inactive)  LOS - 1  Chief Complaint  George Valdez presents with   Fall    Generalized weakness        Brief Narrative:  George Valdez is a 66 y.o. male with medical history significant for hypertension, CAD/PAD, prior stroke with subsequent right-sided weakness, hyperlipidemia, chronic disease stage IIIB admitted on 07/10/2021 with recurrent falls and rhabdomyolysis as well as strep sepsis  Assessment & Plan:   Principal Problem:   Rhabdomyolysis Active Problems:   Acute kidney injury superimposed on CKD ([place])   Essential hypertension   CAD (coronary artery disease)   PAD (peripheral artery disease) (H[facility])   Microcytic anemia   Hypokalemia   AKI (acute kidney injury) ([place])   Frequent falls   Generalized weakness   Failure to thrive in adult   Physical deconditioning   Transaminitis   Hypomagnesemia   Leukocytosis   Dehydration   Mixed hyperlipidemia   History of stroke   1) streptococcal sepsis----preliminary blood culture with strep, -need echo and possible TEE to rule out endocarditis -Continue IV Rocephin  2) rhabdomyolysis in the setting of frequent falls--- CPK trending down -Hydrating  3)Recurrent falls--- CT abdomen and pelvis without acute intra-abdominal finding -Benzodiazepine may be contributing to recurrent falls PTA pt lived at home and did very poorly, George Valdez has significant limitations with mobility related ADLs- this George Valdez needs to continue to be monitored in the hospital until a SNF bed is obtained as she is not safe to go home with her current physcical limitations  4)AKI----acute kidney injury on CKD stage -3B -Due to dehydration in the setting of recurrent falls and poor oral intake and rhabdomyolysis -- Continue to hydrate   renally adjust medications, avoid nephrotoxic agents / dehydration  / hypotension  5)CAD/PAD/history of stroke----Continue aspirin,  Hold Plavix as George Valdez may need EGD and colonoscopy  ---- continue to hold Crestor due to rhabdomyolysis and transaminitis  5)COPD--continue bronchodilators  6) transaminitis--- most likely due to rhabdomyolysis, monitor closely -Hold Crestor  7) hypomagnesemia/hyponatremia/hypokalemia--- dehydration and poor oral intake, hydrate and replace electrolytes  8) acute on chronic iron deficiency anemia-- Hgb down to 6.7 transfuse, -Would benefit from EGD and colonoscopy -Protonix for now -Stool occult blood requested -No overt bleeding noted--- however PTA George Valdez was on aspirin and Plavix  Disposition/Need for in-Hospital Stay- George Valdez unable to be discharged at this time due to --- rhabdomyolysis and AKI requiring IV fluids, streptococcal sepsis requiring IV antibiotics  Status is: Inpatient  Remains inpatient appropriate because:   Disposition: The George Valdez is from: Home              Anticipated d/c is to: SNF              Anticipated d/c date is: 2 days              George Valdez currently is not medically stable to d/c. Barriers: Not Clinically Stable-   Code Status :  -  Code Status: Full Code   Family Communication:   (George Valdez is alert, awake and coherent)  Discussed with his wife  Consults  :  Gi/Palliative  DVT Prophylaxis  :   - SCDs/?? GI bleed    Lab Results  Component Value Date   PLT 322 07/11/2021  Inpatient Medications  Scheduled Meds:  aspirin EC  81 mg Oral Daily   clopidogrel  75 mg Oral Daily   enoxaparin (LOVENOX) injection  30 mg Subcutaneous Q24H   feeding supplement  237 mL Oral TID BM   multivitamin with minerals  1 tablet Oral Daily   umeclidinium bromide  1 puff Inhalation Daily   Continuous Infusions:  cefTRIAXone (ROCEPHIN)  IV Stopped (07/10/21 2204)   dextrose 5 % and 0.9 % NaCl with KCl 20 mEq/L 125 mL/hr  at 07/11/21 1034   PRN Meds:.albuterol, ALPRAZolam   Anti-infectives (From admission, onward)    Start     Dose/Rate Route Frequency Ordered Stop   07/10/21 2100  cefTRIAXone (ROCEPHIN) 2 g in sodium chloride 0.9 % 100 mL IVPB        2 g 200 mL/hr over 30 Minutes Intravenous Every 24 hours 07/10/21 2045 07/17/21 2059         Subjective: George Valdez today has no fevers, no emesis,  No chest pain,   - Oral intake is fair  Objective: Vitals:   07/11/21 1230 07/11/21 1250 07/11/21 1509 07/11/21 1540  BP: (!) 63/37 (!) 68/45 112/90 (!) 75/60  Pulse: 96 98 99 93  Resp: 20 20 18 20   Temp: 98.4 F (36.9 C) 97.7 F (36.5 C) 97.7 F (36.5 C) 98 F (36.7 C)  TempSrc:   Oral   SpO2: 90% 91% 91% 92%  Weight:      Height:        Intake/Output Summary (Last 24 hours) at 07/11/2021 1803 Last data filed at 07/11/2021 1752 Gross per 24 hour  Intake 5050.11 ml  Output 1500 ml  Net 3550.11 ml   Filed Weights   07/10/21 2047  Weight: 79.8 kg     Physical Exam  Gen:- Awake Alert, chronically ill-appearing HEENT:- [employer].AT, No sclera icterus Neck-Supple Neck,No JVD,.  Lungs-diminished breath sounds without wheezing or rhonchi CV- S1, S2 normal, regular  Abd-  +ve B.Sounds, Abd Soft, No tenderness,    Extremity/Skin:- No  edema, pedal pulses present  Psych-affect is appropriate, oriented x3 Neuro-generalized weakness, no new focal deficits, no tremors  Data Reviewed: I have personally reviewed following labs and imaging studies  CBC: Recent Labs  Lab 07/10/21 2048 07/11/21 0550  WBC 22.8* 18.4*  NEUTROABS 20.9*  --   HGB 8.3* 6.7*  HCT 26.6* 22.3*  MCV 78.5* 79.4*  PLT 363 546   Basic Metabolic Panel: Recent Labs  Lab 07/10/21 2048 07/11/21 0550  NA 125* 132*  K 2.2* 2.3*  CL 96* 106  CO2 17* 16*  GLUCOSE 95 102*  BUN 39* 33*  CREATININE 3.08* 2.60*  CALCIUM 8.2* 7.5*  MG 1.6* 2.0  PHOS  --  3.5   GFR: Estimated Creatinine Clearance: 26.1  mL/min (A) (by C-G formula based on SCr of 2.6 mg/dL (H)). Liver Function Tests: Recent Labs  Lab 07/10/21 2048 07/11/21 0550  AST 166* 160*  ALT 161* 135*  ALKPHOS 83 71  BILITOT 0.6 0.4  PROT 6.4* 5.5*  ALBUMIN 2.4* 2.0*   No results for input(s): LIPASE, AMYLASE in the last 168 hours. Recent Labs  Lab 07/10/21 2112  AMMONIA 15   Coagulation Profile: Recent Labs  Lab 07/10/21 2048  INR 1.1   Cardiac Enzymes: Recent Labs  Lab 07/10/21 2048 07/11/21 0550  CKTOTAL 3,313* 3,797*   BNP (last 3 results) No results for input(s): PROBNP in the last 8760 hours. HbA1C: No results for input(s): HGBA1C  in the last 72 hours. CBG: Recent Labs  Lab 07/10/21 2048  GLUCAP 95   Lipid Profile: No results for input(s): CHOL, HDL, LDLCALC, TRIG, CHOLHDL, LDLDIRECT in the last 72 hours. Thyroid Function Tests: No results for input(s): TSH, T4TOTAL, FREET4, T3FREE, THYROIDAB in the last 72 hours. Anemia Panel: Recent Labs    07/10/21 2048 07/11/21 0550  VITAMINB12 689  --   FERRITIN  --  124  TIBC  --  266  IRON  --  11*   Urine analysis:    Component Value Date/Time   COLORURINE YELLOW 07/10/2021 2146   APPEARANCEUR CLEAR 07/10/2021 2146   LABSPEC 1.015 07/10/2021 2146   PHURINE 6.0 07/10/2021 2146   GLUCOSEU NEGATIVE 07/10/2021 2146   HGBUR LARGE (A) 07/10/2021 2146   BILIRUBINUR NEGATIVE 07/10/2021 2146   [place] NEGATIVE 07/10/2021 2146   PROTEINUR 100 (A) 07/10/2021 2146   UROBILINOGEN 0.2 09/10/2014 0908   NITRITE NEGATIVE 07/10/2021 2146   LEUKOCYTESUR SMALL (A) 07/10/2021 2146   Sepsis Labs: @LABRCNTIP (procalcitonin:4,lacticidven:4)  ) Recent Results (from the past 240 hour(s))  Blood Culture (routine x 2)     Status: None (Preliminary result)   Collection Time: 07/10/21  9:13 PM   Specimen: BLOOD RIGHT ARM  Result Value Ref Range Status   Specimen Description   Final    BLOOD RIGHT ARM BOTTLES DRAWN AEROBIC ONLY Performed at [facility], [address], [place], [place] [address]    Special Requests   Final    Blood Culture adequate volume Performed at [facility], [address] [place], [place] [address]    Culture  Setup Time   Final    GRAM POSITIVE COCCI Gram Stain Report Called to,Read Back By and Verified With: Mellody Drown @ 1231 07/11/21 BY STEPHTR AEROBIC BOTTLE ONLY Organism ID to follow CRITICAL RESULT CALLED TO, READ BACK BY AND VERIFIED WITHMellody Drown RN [phone] 07/11/21 A [place] Performed at [facility], [place] [address], [place], [place] [address]    Culture GRAM POSITIVE COCCI  Final   Report Status PENDING  Incomplete  Blood Culture ID Panel (Reflexed)     Status: Abnormal   Collection Time: 07/10/21  9:13 PM  Result Value Ref Range Status   Enterococcus faecalis NOT DETECTED NOT DETECTED Final   Enterococcus Faecium NOT DETECTED NOT DETECTED Final   Listeria monocytogenes NOT DETECTED NOT DETECTED Final   Staphylococcus species NOT DETECTED NOT DETECTED Final   Staphylococcus aureus (BCID) NOT DETECTED NOT DETECTED Final   Staphylococcus epidermidis NOT DETECTED NOT DETECTED Final   Staphylococcus lugdunensis NOT DETECTED NOT DETECTED Final   Streptococcus species DETECTED (A) NOT DETECTED Final    Comment: Not Enterococcus species, Streptococcus agalactiae, Streptococcus pyogenes, or Streptococcus pneumoniae. CRITICAL RESULT CALLED TO, READ BACK BY AND VERIFIED WITHMellody Drown RN [phone] 07/11/21 A [place]    Streptococcus agalactiae NOT DETECTED NOT DETECTED Final   Streptococcus pneumoniae NOT DETECTED NOT DETECTED Final   Streptococcus pyogenes NOT DETECTED NOT DETECTED Final   A.calcoaceticus-baumannii NOT DETECTED NOT DETECTED Final   Bacteroides fragilis NOT DETECTED NOT DETECTED Final   Enterobacterales NOT DETECTED NOT DETECTED Final   Enterobacter cloacae complex NOT DETECTED NOT DETECTED Final   Escherichia coli NOT DETECTED NOT DETECTED Final   Klebsiella aerogenes NOT  DETECTED NOT DETECTED Final   Klebsiella oxytoca NOT DETECTED NOT DETECTED Final   Klebsiella pneumoniae NOT DETECTED NOT DETECTED Final   Proteus species NOT DETECTED NOT DETECTED Final  Salmonella species NOT DETECTED NOT DETECTED Final   Serratia marcescens NOT DETECTED NOT DETECTED Final   Haemophilus influenzae NOT DETECTED NOT DETECTED Final   Neisseria meningitidis NOT DETECTED NOT DETECTED Final   Pseudomonas aeruginosa NOT DETECTED NOT DETECTED Final   Stenotrophomonas maltophilia NOT DETECTED NOT DETECTED Final   Candida albicans NOT DETECTED NOT DETECTED Final   Candida auris NOT DETECTED NOT DETECTED Final   Candida glabrata NOT DETECTED NOT DETECTED Final   Candida krusei NOT DETECTED NOT DETECTED Final   Candida parapsilosis NOT DETECTED NOT DETECTED Final   Candida tropicalis NOT DETECTED NOT DETECTED Final   Cryptococcus neoformans/gattii NOT DETECTED NOT DETECTED Final    Comment: Performed at [facility], [place] [address], [place], [place] [address]  Blood Culture (routine x 2)     Status: None (Preliminary result)   Collection Time: 07/10/21  9:24 PM   Specimen: BLOOD LEFT HAND  Result Value Ref Range Status   Specimen Description BLOOD LEFT HAND BOTTLES DRAWN AEROBIC ONLY  Final   Special Requests Blood Culture adequate volume  Final   Culture   Final    NO GROWTH < 12 HOURS Performed at [facility], [address], [place], [place] [address]    Report Status PENDING  Incomplete  Resp Panel by RT-PCR (Flu A&B, Covid) Nasopharyngeal Swab     Status: None   Collection Time: 07/10/21  9:54 PM   Specimen: Nasopharyngeal Swab; Nasopharyngeal(NP) swabs in vial transport medium  Result Value Ref Range Status   SARS Coronavirus 2 by RT PCR NEGATIVE NEGATIVE Final    Comment: (NOTE) SARS-CoV-2 target nucleic acids are NOT DETECTED.  The SARS-CoV-2 RNA is generally detectable in upper respiratory specimens during the acute phase of infection. The  lowest concentration of SARS-CoV-2 viral copies this assay can detect is 138 copies/mL. A negative result does not preclude SARS-Cov-2 infection and should not be used as the sole basis for treatment or other George Valdez management decisions. A negative result may occur with  improper specimen collection/handling, submission of specimen other than nasopharyngeal swab, presence of viral mutation(s) within the areas targeted by this assay, and inadequate number of viral copies(<138 copies/mL). A negative result must be combined with clinical observations, George Valdez history, and epidemiological information. The expected result is Negative.  Fact Sheet for Patients:  EntrepreneurPulse.com.au  Fact Sheet for Healthcare Providers:  IncredibleEmployment.be  This test is no t yet approved or cleared by the [place] FDA and  has been authorized for detection and/or diagnosis of SARS-CoV-2 by FDA under an Emergency Use Authorization (EUA). This EUA will remain  in effect (meaning this test can be used) for the duration of the COVID-19 declaration under Section 564(b)(1) of the Act, 21 U.S.C.section 360bbb-3(b)(1), unless the authorization is terminated  or revoked sooner.       Influenza A by PCR NEGATIVE NEGATIVE Final   Influenza B by PCR NEGATIVE NEGATIVE Final    Comment: (NOTE) The Xpert Xpress SARS-CoV-2/FLU/RSV plus assay is intended as an aid in the diagnosis of influenza from Nasopharyngeal swab specimens and should not be used as a sole basis for treatment. Nasal washings and aspirates are unacceptable for Xpert Xpress SARS-CoV-2/FLU/RSV testing.  Fact Sheet for Patients: EntrepreneurPulse.com.au  Fact Sheet for Healthcare Providers: IncredibleEmployment.be  This test is not yet approved or cleared by the [place] FDA and has been authorized for detection and/or diagnosis of SARS-CoV-2 by FDA under  an Emergency Use Authorization (EUA). This EUA  will remain in effect (meaning this test can be used) for the duration of the COVID-19 declaration under Section 564(b)(1) of the Act, 21 U.S.C. section 360bbb-3(b)(1), unless the authorization is terminated or revoked.  Performed at [facility], [address], [place], [place] [address]       Radiology Studies: CT ABDOMEN PELVIS WO CONTRAST  Result Date: 07/10/2021 CLINICAL DATA:  [place], generalized weakness EXAM: CT ABDOMEN AND PELVIS WITHOUT CONTRAST TECHNIQUE: Multidetector CT imaging of the abdomen and pelvis was performed following the standard protocol without IV contrast. Unenhanced CT was performed per clinician order. Lack of IV contrast limits sensitivity and specificity, especially for evaluation of abdominal/pelvic solid viscera. RADIATION DOSE REDUCTION: This exam was performed according to the departmental dose-optimization program which includes automated exposure control, adjustment of the mA and/or kV according to George Valdez size and/or use of iterative reconstruction technique. COMPARISON:  None. FINDINGS: Lower chest: Bibasilar scarring and fibrosis. No acute pleural or parenchymal lung disease. Hepatobiliary: Unremarkable unenhanced appearance of the liver and gallbladder. Pancreas: Unremarkable unenhanced appearance. Spleen: Unremarkable unenhanced appearance. Adrenals/Urinary Tract: Low-attenuation nodular thickening left adrenal gland consistent with hyperplasia or adenoma. Right adrenals unremarkable. No urinary tract calculi or obstructive uropathy within either kidney. Vascular calcifications are seen at the renal hila. The bladder is unremarkable. Stomach/Bowel: No bowel obstruction or ileus. Large amount of retained stool within the rectal vault may reflect fecal impaction. Normal appendix right lower quadrant. No bowel wall thickening or inflammatory change. Vascular/Lymphatic: Diffuse aortic atherosclerosis. Evaluation of the  vascular lumen limited by the lack of IV contrast. No pathologic adenopathy. Reproductive: Prostate is not enlarged. Testicles are retracted into the distal inguinal canals bilaterally. Other: No free intraperitoneal fluid or free gas. No abdominal wall hernia. Musculoskeletal: Age-indeterminate fracture of the right fourth costochondral junction, only partially visualized on this study. Chronic right posterolateral eleventh rib fracture with moderate callus formation. No other acute bony abnormalities. Subcutaneous fat stranding in the right gluteal region may reflect direct trauma and ecchymosis. No evidence of hematoma. Reconstructed images demonstrate no additional findings. IMPRESSION: 1. Subcutaneous fat stranding right gluteal region which may reflect direct trauma and ecchymosis. 2. Age indeterminate fracture of the right fourth costochondral junction, only partially visualized on this study. Chronic right posterolateral eleventh rib fracture. 3. Otherwise no acute intra-abdominal crash that marked retained stool within the distal colon, compatible with fecal impaction. No bowel obstruction or ileus. 4.  Aortic Atherosclerosis (ICD10-I70.0). Electronically Signed   By: Randa Ngo M.D.   On: 07/10/2021 22:37   CT Head Wo Contrast  Result Date: 07/10/2021 CLINICAL DATA:  Recent fall with weakness, initial encounter EXAM: CT HEAD WITHOUT CONTRAST TECHNIQUE: Contiguous axial images were obtained from the base of the skull through the vertex without intravenous contrast. RADIATION DOSE REDUCTION: This exam was performed according to the departmental dose-optimization program which includes automated exposure control, adjustment of the mA and/or kV according to George Valdez size and/or use of iterative reconstruction technique. COMPARISON:  08/22/2016 FINDINGS: Brain: No evidence of acute infarction, hemorrhage, hydrocephalus, extra-axial collection or mass lesion/mass effect. Changes of prior left cerebellar  infarct are again noted and stable with encephalomalacia changes. Scattered lacunar infarcts in the basal ganglia bilaterally are again noted. Vascular: No hyperdense vessel or unexpected calcification. Skull: Normal. Negative for fracture or focal lesion. Sinuses/Orbits: No acute finding. Other: None. IMPRESSION: Chronic  ischemic changes without acute abnormality. Electronically Signed   By: [name] M.D.   On: 07/10/2021 21:10   DG Chest [facility]  1 View  Result Date: 07/10/2021 CLINICAL DATA:  cough, fall EXAM: PORTABLE CHEST 1 VIEW COMPARISON:  Chest x-ray 08/22/2016 FINDINGS: The heart and mediastinal contours are unchanged. Aortic calcification No focal consolidation. No pulmonary edema. No pleural effusion. No pneumothorax. No acute osseous abnormality. IMPRESSION: No active disease. Electronically Signed   By: Iven Finn M.D.   On: 07/10/2021 21:11     Scheduled Meds:  aspirin EC  81 mg Oral Daily   clopidogrel  75 mg Oral Daily   enoxaparin (LOVENOX) injection  30 mg Subcutaneous Q24H   feeding supplement  237 mL Oral TID BM   multivitamin with minerals  1 tablet Oral Daily   umeclidinium bromide  1 puff Inhalation Daily   Continuous Infusions:  cefTRIAXone (ROCEPHIN)  IV Stopped (07/10/21 2204)   dextrose 5 % and 0.9 % NaCl with KCl 20 mEq/L 125 mL/hr at 07/11/21 1034     LOS: 1 day    Roxan Hockey M.D on 07/11/2021 at 6:03 PM  Go to [email or web address] - for contact info  [facility] - Office  [phone]  If 7PM-7AM, please contact night-coverage [email or web address] Password [facility] 07/11/2021, 6:03 PM

## 2021-07-11 NOTE — Progress Notes (Signed)
Initial Nutrition Assessment  DOCUMENTATION CODES:   Obesity unspecified  INTERVENTION:   -MVI with minerals daily -Increase Ensure Enlive po to TID, each supplement provides 350 kcal and 20 grams of protein  -Liberalize diet to regular for widest variety of meal selections in attempt to optimize PO intake  NUTRITION DIAGNOSIS:   Inadequate oral intake related to poor appetite as evidenced by meal completion < 50%.  GOAL:   Patient will meet greater than or equal to 90% of their needs  MONITOR:   PO intake, Supplement acceptance, Labs, Weight trends, Skin, I & O's  REASON FOR ASSESSMENT:   Consult Assessment of nutrition requirement/status  ASSESSMENT:   George Valdez is a 66 y.o. male with medical history significant for hypertension, CAD/PAD, prior stroke with subsequent right-sided weakness, hyperlipidemia, chronic disease stage III who presents to the emergency department via EMS due to fall and generalized weakness.  Patient complained of 3-day onset of increased weakness, decreased food and fluid intake.  Patient patient states that he lives at home with wife who also have similar conditions and was taken to the ED for further evaluation and management.  When EMS team first arrived to the house, he was met on the floor, he was picked up and placed on his electric chair while wife was being taken to the ED.  On return to check on patient, he was already back to the floor.  He only complained of weakness and denies chest pain, shortness of breath, fever, chills, headache, blurry vision, numbness and tingling.  Pt admitted with rhabdomyolysis and frequent falls secondary to FTT.   Reviewed I/O's: +2.4 L x 24 hours  UOP: 600 ml x 24 hours   Pt unavailable at time of visit. Attempted to speak with pt via call to hospital room phone, however, unable to reach. RD unable to obtain further nutrition-related history or complete nutrition-focused physical exam at this time.      Pt currently on a heart healthy diet with poor oral intake; noted meal completions 0-50%. He is drinking Ensure Enlive supplements.   Reviewed wt hx; wt has been stable over the past year.   Given pt's poor oral intake, RD will liberalize diet to provide wider variety of food selections in attempt to promote adequate oral intake.   Medications reviewed and include lasix and dextrose 5% an 0,9% NaCl with KCl 20 mEq/L infusion @ 125 ml/hr.  Lab Results  Component Value Date   HGBA1C 5.9 (H) 11/30/2015   PTA DM medications are none.   Labs reviewed: Na: 132, CBGS: 95 (inpatient orders for glycemic control are none).    Diet Order:   Diet Order             Diet Heart Room service appropriate? Yes; Fluid consistency: Thin  Diet effective now                   EDUCATION NEEDS:   No education needs have been identified at this time  Skin:  Skin Assessment: Reviewed RN Assessment  Last BM:  07/05/21  Height:   Ht Readings from Last 1 Encounters:  07/10/21 _0  (1.6 m)    Weight:   Wt Readings from Last 1 Encounters:  07/10/21 79.8 kg    Ideal Body Weight:  56.4 kg  BMI:  Body mass index is 31.16 kg/m.  Estimated Nutritional Needs:   Kcal:  1700-1900  Protein:  85-100 grams  Fluid:  > 1.6 L  Loistine Chance, RD, LDN, [place] Registered Dietitian II Certified Diabetes Care and Education Specialist Please refer to [facility] for RD and/or RD on-call/weekend/after hours pager

## 2021-07-11 NOTE — Progress Notes (Signed)
Date and time results received: 07/11/21 at 1236   Test: Blood culture Critical Value: Positive for gram positive cocci in one of bottle  Name of Provider Notified: Dr. Roxan Hockey  Orders Received? Or Actions Taken?:  No new order given.

## 2021-07-11 NOTE — TOC Initial Note (Addendum)
Transition of Care [facility]) - Initial/Assessment Note    Patient Details  Name: George Valdez MRN: [id number] Date of Birth: 05-Jun-1956  Transition of Care [facility]) CM/SW Contact:    Kerin Salen, RN Phone Number: 07/11/2021, 10:46 AM  Clinical Narrative: Patient NMS at this time unable to do TOC assessment. TOC to follow up for evaluation of possible placement. PT pending at this time.  Spoke with patients Step daughter who reports that patient is not mobile uses motorized wheelchair and will also need [place] services and Meals on Wheels. Step daughter Anderson Malta [phone] reports that patient is able to do ADL's, however she found him on the floor, dehydrated and not eaten in 3 days, same as wife, her mother. TOC to follow up pending PT evaluation.                        Patient Goals and CMS Choice        Expected Discharge Plan and Services                                                Prior Living Arrangements/Services                       Activities of Daily Living Home Assistive Devices/Equipment: Eyeglasses, Shower chair with back, Other (Comment) (power wheelchair) ADL Screening (condition at time of admission) Patient's cognitive ability adequate to safely complete daily activities?: Yes Is the patient deaf or have difficulty hearing?: Yes Does the patient have difficulty seeing, even when wearing glasses/contacts?: No Does the patient have difficulty concentrating, remembering, or making decisions?: Yes Patient able to express need for assistance with ADLs?: Yes Does the patient have difficulty dressing or bathing?: Yes Independently performs ADLs?: Yes (appropriate for developmental age) (independent at home, frequent falls/ weakness) Does the patient have difficulty walking or climbing stairs?: Yes Weakness of Legs: Both Weakness of Arms/Hands: None  Permission Sought/Granted                  Emotional Assessment               Admission diagnosis:  Rhabdomyolysis [M62.82] Hypokalemia [E87.6] Hyponatremia [E87.1] Severe sepsis ([place]) [A41.9, R65.20] Traumatic rhabdomyolysis, initial encounter ([place]) [T79.6XXA] Acute renal failure, unspecified acute renal failure type ([place]) [N17.9] Anemia, unspecified type [D64.9] Patient Active Problem List   Diagnosis Date Noted   Rhabdomyolysis 07/10/2021   Generalized weakness 07/10/2021   Failure to thrive in adult 07/10/2021   Physical deconditioning 07/10/2021   Transaminitis 07/10/2021   Hypomagnesemia 07/10/2021   Leukocytosis 07/10/2021   Dehydration 07/10/2021   Mixed hyperlipidemia 07/10/2021   History of stroke 07/10/2021   Altered mental status 08/22/2016   Aspiration pneumonia ([place]) 08/22/2016   UTI (urinary tract infection) 08/22/2016   Protein-calorie malnutrition, severe 07/30/2016   Falls 07/22/2016   Weakness of left side of body 07/22/2016   h/o CVA (cerebral vascular accident) [facility]) with left sided weakness 07/22/2016   Alcohol abuse 07/22/2016   Hypotension 07/22/2016   Frequent falls    Microcytic anemia 11/30/2015   Hypokalemia 11/30/2015   AKI (acute kidney injury) ([place]) 11/30/2015   Acute encephalopathy 09/10/2014   Alcohol withdrawal delirium ([place]) 09/10/2014   Acute kidney injury superimposed on CKD ([place]) 09/10/2014   Sinus bradycardia 09/10/2014   Essential  hypertension 09/10/2014   CAD (coronary artery disease) 09/10/2014   PAD (peripheral artery disease) ([place]) 09/10/2014   Pernicious anemia 09/10/2014   PCP:  Patient, No Pcp Per (Inactive) Pharmacy:   [facility] [place] [address] [place] [address] Phone: [phone] Fax: [phone]     Social Determinants of Health (SDOH) Interventions    Readmission Risk Interventions No flowsheet data found.

## 2021-07-12 ENCOUNTER — Inpatient Hospital Stay (HOSPITAL_COMMUNITY): Payer: Medicare HMO

## 2021-07-12 ENCOUNTER — Encounter (HOSPITAL_COMMUNITY): Payer: Self-pay | Admitting: Internal Medicine

## 2021-07-12 DIAGNOSIS — R7881 Bacteremia: Secondary | ICD-10-CM

## 2021-07-12 DIAGNOSIS — N390 Urinary tract infection, site not specified: Secondary | ICD-10-CM

## 2021-07-12 DIAGNOSIS — B951 Streptococcus, group B, as the cause of diseases classified elsewhere: Secondary | ICD-10-CM | POA: Insufficient documentation

## 2021-07-12 DIAGNOSIS — B9620 Unspecified Escherichia coli [E. coli] as the cause of diseases classified elsewhere: Secondary | ICD-10-CM

## 2021-07-12 DIAGNOSIS — R7401 Elevation of levels of liver transaminase levels: Secondary | ICD-10-CM

## 2021-07-12 DIAGNOSIS — M6282 Rhabdomyolysis: Secondary | ICD-10-CM

## 2021-07-12 DIAGNOSIS — D649 Anemia, unspecified: Secondary | ICD-10-CM

## 2021-07-12 DIAGNOSIS — Z515 Encounter for palliative care: Secondary | ICD-10-CM

## 2021-07-12 DIAGNOSIS — Z7189 Other specified counseling: Secondary | ICD-10-CM

## 2021-07-12 LAB — HEPATITIS PANEL, ACUTE
HCV Ab: NONREACTIVE
Hep A IgM: NONREACTIVE
Hep B C IgM: NONREACTIVE
Hepatitis B Surface Ag: NONREACTIVE

## 2021-07-12 LAB — COMPREHENSIVE METABOLIC PANEL
ALT: 138 U/L — ABNORMAL HIGH (ref 0–44)
AST: 177 U/L — ABNORMAL HIGH (ref 15–41)
Albumin: 2 g/dL — ABNORMAL LOW (ref 3.5–5.0)
Alkaline Phosphatase: 71 U/L (ref 38–126)
Anion gap: 9 (ref 5–15)
BUN: 31 mg/dL — ABNORMAL HIGH (ref 8–23)
CO2: 17 mmol/L — ABNORMAL LOW (ref 22–32)
Calcium: 7.8 mg/dL — ABNORMAL LOW (ref 8.9–10.3)
Chloride: 113 mmol/L — ABNORMAL HIGH (ref 98–111)
Creatinine, Ser: 2.33 mg/dL — ABNORMAL HIGH (ref 0.61–1.24)
GFR, Estimated: 30 mL/min — ABNORMAL LOW (ref >60)
Glucose, Bld: 111 mg/dL — ABNORMAL HIGH (ref 70–99)
Potassium: 2.4 mmol/L — CL (ref 3.5–5.1)
Sodium: 139 mmol/L (ref 135–145)
Total Bilirubin: 0.6 mg/dL (ref 0.3–1.2)
Total Protein: 5.9 g/dL — ABNORMAL LOW (ref 6.5–8.1)

## 2021-07-12 LAB — TYPE AND SCREEN
ABO/RH(D): A POS
Antibody Screen: NEGATIVE
Unit division: 0
Unit division: 0

## 2021-07-12 LAB — BPAM RBC
Blood Product Expiration Date: 202301272359
Blood Product Expiration Date: 202301292359
ISSUE DATE / TIME: 202301151220
ISSUE DATE / TIME: 202301151516
Unit Type and Rh: 600
Unit Type and Rh: 9500

## 2021-07-12 LAB — CBC
HCT: 32.3 % — ABNORMAL LOW (ref 39.0–52.0)
Hemoglobin: 10.3 g/dL — ABNORMAL LOW (ref 13.0–17.0)
MCH: 25.5 pg — ABNORMAL LOW (ref 26.0–34.0)
MCHC: 31.9 g/dL (ref 30.0–36.0)
MCV: 80 fL (ref 80.0–100.0)
Platelets: 286 10*3/uL (ref 150–400)
RBC: 4.04 MIL/uL — ABNORMAL LOW (ref 4.22–5.81)
RDW: 17.2 % — ABNORMAL HIGH (ref 11.5–15.5)
WBC: 14.7 10*3/uL — ABNORMAL HIGH (ref 4.0–10.5)
nRBC: 0 % (ref 0.0–0.2)

## 2021-07-12 LAB — ECHOCARDIOGRAM COMPLETE
AR max vel: 1.82 cm2
AV Area VTI: 1.97 cm2
AV Area mean vel: 1.7 cm2
AV Mean grad: 3 mmHg
AV Peak grad: 7.8 mmHg
Ao pk vel: 1.4 m/s
Area-P 1/2: 3.77 cm2
Calc EF: 50.6 %
Height: 63 in
MV VTI: 1.49 cm2
S' Lateral: 3.05 cm
Single Plane A2C EF: 51.5 %
Single Plane A4C EF: 50.8 %
Weight: 2814.83 oz

## 2021-07-12 LAB — MAGNESIUM: Magnesium: 2 mg/dL (ref 1.7–2.4)

## 2021-07-12 LAB — PROTIME-INR
INR: 1.2 (ref 0.8–1.2)
Prothrombin Time: 15.1 seconds (ref 11.4–15.2)

## 2021-07-12 MED ORDER — SODIUM CHLORIDE 0.9 % IV BOLUS
500.0000 mL | Freq: Once | INTRAVENOUS | Status: AC
  Administered 2021-07-12: 500 mL via INTRAVENOUS

## 2021-07-12 MED ORDER — POLYETHYLENE GLYCOL 3350 17 G PO PACK
17.0000 g | PACK | Freq: Two times a day (BID) | ORAL | Status: DC
  Administered 2021-07-12 – 2021-07-16 (×6): 17 g via ORAL
  Filled 2021-07-12 (×8): qty 1

## 2021-07-12 MED ORDER — POTASSIUM CHLORIDE CRYS ER 20 MEQ PO TBCR
40.0000 meq | EXTENDED_RELEASE_TABLET | ORAL | Status: DC
Start: 2021-07-12 — End: 2021-07-12
  Filled 2021-07-12: qty 2

## 2021-07-12 MED ORDER — POTASSIUM CHLORIDE CRYS ER 20 MEQ PO TBCR
40.0000 meq | EXTENDED_RELEASE_TABLET | ORAL | Status: AC
  Administered 2021-07-12 (×2): 40 meq via ORAL
  Filled 2021-07-12: qty 2

## 2021-07-12 MED ORDER — POTASSIUM CHLORIDE CRYS ER 20 MEQ PO TBCR
40.0000 meq | EXTENDED_RELEASE_TABLET | Freq: Once | ORAL | Status: AC
  Administered 2021-07-12: 40 meq via ORAL
  Filled 2021-07-12: qty 2

## 2021-07-12 NOTE — Progress Notes (Addendum)
°   07/12/21 2023  Assess: MEWS Score  Temp 98.1 F (36.7 C)  BP (!) 82/66  Pulse Rate 98  Resp (!) 23  SpO2 98 %  O2 Device Room Air  Assess: MEWS Score  MEWS Temp 0  MEWS Systolic 1  MEWS Pulse 0  MEWS RR 1  MEWS LOC 0  MEWS Score 2  MEWS Score Color Yellow  Assess: if the MEWS score is Yellow or Red  Were vital signs taken at a resting state? Yes  Focused Assessment No change from prior assessment  Early Detection of Sepsis Score *See Row Information* Medium  MEWS guidelines implemented *See Row Information* Yes  Treat  MEWS Interventions Other (Comment)  Take Vital Signs  Increase Vital Sign Frequency  Yellow: Q 2hr X 2 then Q 4hr X 2, if remains yellow, continue Q 4hrs  Escalate  MEWS: Escalate Yellow: discuss with charge nurse/RN and consider discussing with provider and RRT  Notify: Charge Nurse/RN  Name of Charge Nurse/RN Notified Bree RN  Date Charge Nurse/RN Notified 07/12/21  Time Charge Nurse/RN Notified 2028  Notify: Provider  Provider Name/Title Dr Josephine Cables  Date Provider Notified 07/12/21  Time Provider Notified 2028  Notification Type Page  Notification Reason Change in status   Awaiting response from MD. Will recheck vitals q.2h x2  **update - MD ordered fluid bolus

## 2021-07-12 NOTE — Consult Note (Signed)
Consultation Note Date: 07/12/2021   Patient Name: George Valdez  DOB: December 13, 1955  MRN: [id number]  Age / Sex: 66 y.o., male  PCP: Patient, No Pcp Per (Inactive) Referring Physician: Roxan Hockey, MD  Reason for Consultation: Establishing goals of care  HPI/Patient Profile: 66 y.o. male  with past medical history of CAD, PAD, prior stroke in the past with left weakness, CKD, history of alcohol abuse, BPH, anxiety/depression, HTN/HLD, frequent falls, carotid endarterectomy, admitted on 07/10/2021 with fall, found on the floor after 3 days, rhabdo.   Clinical Assessment and Goals of Care: I have reviewed medical records including EPIC notes, labs and imaging, received report from RN, assessed the patient.  During my first visit [name] was being attended to by nursing staff and so I returned later in the afternoon.   [name] is lying quietly in bed.  He appears acutely/chronically ill and somewhat frail.  He is alert and oriented, able to make his basic needs known.  There is no family at bedside at this time.  We meet to discuss diagnosis prognosis, GOC, EOL wishes, disposition and options.  I introduced Palliative Medicine as specialized medical care for people living with serious illness. It focuses on providing relief from the symptoms and stress of a serious illness. The goal is to improve quality of life for both the patient and the family.  We discussed a brief life review of the patient.  George Valdez was born in [place] when his father was a GI.  He moved to [place] as an infant.  He tells me that he has been married to George Valdez for 33 years.  She is his third wife.  He has adult children who live in [place].  We then focused on their current illness.  We talked about his fall, poor mobility.  He tells me that he is agreeable to short-term rehab and attempt to regain some strength and balance.  We  talked about his GI issues, bowel regimen.  He shares that he feels if he were able to have a good bowel movement he would feel much better.   The natural disease trajectory and expectations at EOL were discussed.  Advanced directives, concepts specific to code status, were considered and discussed.  George Valdez tells me that he and his wife of 82 years have discussed CODE STATUS, advanced directives.  He states, "do not resuscitate me".  Palliative Care services outpatient were explained and offered.  Mr. Oberman is agreeable to outpatient palliative services to continue goals of care discussions.  Provider choice offered, hospice of [facility]  Discussed the importance of continued conversation with family and the medical providers regarding overall plan of care and treatment options, ensuring decisions are within the context of the patients values and GOCs.  Questions and concerns were addressed.  The family was encouraged to call with questions or concerns.  PMT will continue to support holistically.  Conference with attending, bedside nursing staff, transition of care team related to patient condition, needs, goals of care, disposition.  PMT to follow.   HCPOA  NEXT OF KIN -normally, wife of 33 years, George Valdez would be healthcare agent.  Due to her illness, stepdaughter, George Valdez, is Air traffic controller.  George Valdez tells me that his children live in [place].    SUMMARY OF RECOMMENDATIONS   At this point continue to treat the treatable but no CPR or intubation Time for outcomes Agreeable to short-term rehab Outpatient palliative services to follow, George Valdez: DNR -verified with patient who tells me that he and his wife of 33 years have had CODE STATUS discussions.  Symptom Management:  Per hospitalist, no additional needs at this time.  Palliative Prophylaxis:  Frequent Pain Assessment, Oral Care, and Turn Reposition  Additional  Recommendations (Limitations, Scope, Preferences): Continue to treat the treatable but no CPR or intubation  Psycho-social/Spiritual:  Desire for further Chaplaincy support:no Additional Recommendations: Caregiving  Support/Resources  Prognosis:  Unable to determine, based on outcomes.  Guarded at this point due to acuity of condition, chronic health burden, poor mobility.  Discharge Planning:  Anticipate short-term rehab, possible need for long-term care.       Primary Diagnoses: Present on Admission:  Rhabdomyolysis  Hypokalemia  CAD (coronary artery disease)   I have reviewed the medical record, interviewed the patient and family, and examined the patient. The following aspects are pertinent.  Past Medical History:  Diagnosis Date   Alcohol abuse    Anxiety    BPH (benign prostatic hyperplasia)    Chronic pain    Coronary artery disease    Depression    Frequent falls    Hyperlipemia    Hypertension    Neuropathy    Pernicious anemia    Stroke ([place])    left sided weakness   Social History   Socioeconomic History   Marital status: Married    Spouse name: Not on file   Number of children: Not on file   Years of education: Not on file   Highest education level: Not on file  Occupational History   Not on file  Tobacco Use   Smoking status: Every Day    Packs/day: 1.00    Years: 40.00    Pack years: 40.00    Types: Cigarettes   Smokeless tobacco: Never   Tobacco comments:    down to 5 cigarettes/day from 1.5 PPD  Vaping Use   Vaping Use: Never used  Substance and Sexual Activity   Alcohol use: Not Currently    Alcohol/week: 12.0 standard drinks    Types: 12 Standard drinks or equivalent per week    Comment: denies   Drug use: No   Sexual activity: Not on file  Other Topics Concern   Not on file  Social History Narrative   Not on file   Social Determinants of Health   Financial Resource Strain: Not on file  Food Insecurity: Not on file   Transportation Needs: Not on file  Physical Activity: Not on file  Stress: Not on file  Social Connections: Not on file   Family History  Problem Relation Age of Onset   Congestive Heart Failure Mother 84   Heart attack Father 30   Congestive Heart Failure Sister    Colon cancer Neg Hx    Colon polyps Neg Hx    Scheduled Meds:  aspirin EC  81 mg Oral Daily   feeding supplement  237 mL Oral TID BM   multivitamin with minerals  1 tablet Oral Daily  pantoprazole (PROTONIX) IV  40 mg Intravenous Q24H   polyethylene glycol  17 g Oral BID   umeclidinium bromide  1 puff Inhalation Daily   Continuous Infusions:  cefTRIAXone (ROCEPHIN)  IV Stopped (07/11/21 2031)   dextrose 5 % and 0.9 % NaCl with KCl 20 mEq/L Stopped (07/11/21 2002)   PRN Meds:.albuterol, ALPRAZolam Medications Prior to Admission:  Prior to Admission medications   Medication Sig Start Date End Date Taking? Authorizing Provider  albuterol (PROVENTIL HFA;VENTOLIN HFA) 108 (90 BASE) MCG/ACT inhaler Inhale 2 puffs into the lungs every 6 (six) hours as needed for wheezing or shortness of breath.    [provider]  ALPRAZolam Duanne Moron) 1 MG tablet SMARTSIG:1 Tablet(s) By Mouth 1-3 Times Daily PRN 06/07/21   [provider]  aspirin EC 81 MG tablet Take 81 mg by mouth daily. Reported on 11/16/2015    [provider]  chlordiazePOXIDE (LIBRIUM) 5 MG capsule Take 1 capsule (5 mg total) by mouth 2 (two) times daily as needed for anxiety. 08/23/16   Kathie Dike, MD  clopidogrel (PLAVIX) 75 MG tablet Take 75 mg by mouth daily.    [provider]  cyanocobalamin (,VITAMIN B-12,) 1000 MCG/ML injection Inject 1,000 mcg into the muscle every 30 (thirty) days.    [provider]  DULoxetine (CYMBALTA) 60 MG capsule Take 60 mg by mouth daily.    [provider]  ezetimibe (ZETIA) 10 MG tablet Take 1 tablet (10 mg total) by mouth daily. 10/07/19 01/05/20  Herminio Commons, MD   folic acid (FOLVITE) 1 MG tablet Take 1 tablet (1 mg total) by mouth daily. 08/04/16   Isaac Bliss, Rayford Halsted, MD  gabapentin (NEURONTIN) 300 MG capsule Take 300 mg by mouth 3 (three) times daily. 04/20/21   [provider]  gabapentin (NEURONTIN) 800 MG tablet Take 800 mg by mouth 3 (three) times daily.    [provider]  HYDROcodone-acetaminophen (NORCO/VICODIN) 5-325 MG tablet Take 1 tablet by mouth every 6 (six) hours as needed for moderate pain.    [provider]  isosorbide mononitrate (ISMO,MONOKET) 20 MG tablet Take 10 mg by mouth daily.    [provider]  metoprolol succinate (TOPROL-XL) 50 MG 24 hr tablet Take 50 mg by mouth 2 (two) times daily. Take with or immediately following a meal.    [provider]  Multiple Vitamin (MULTIVITAMIN WITH MINERALS) TABS tablet Take 1 tablet by mouth daily. 09/12/14   Samuella Cota, MD  potassium chloride 20 MEQ TBCR Take 20 mEq by mouth daily. 08/23/16   Kathie Dike, MD  potassium chloride SA (KLOR-CON M) 20 MEQ tablet Take 20 mEq by mouth daily. 06/02/21   [provider]  rosuvastatin (CRESTOR) 40 MG tablet Take 40 mg by mouth daily.    [provider]  thiamine 100 MG tablet Take 1 tablet (100 mg total) by mouth daily. 12/03/15   Orvan Falconer, MD  tiotropium (SPIRIVA) 18 MCG inhalation capsule Place 18 mcg into inhaler and inhale daily.     [provider]  vitamin E 400 UNIT capsule Take 400 Units by mouth daily.    [provider]   Allergies  Allergen Reactions   Codeine Itching   Review of Systems  Unable to perform ROS: Acuity of condition   Physical Exam Vitals and nursing note reviewed.  Constitutional:      General: He is not in acute distress.    Appearance: He is ill-appearing.  Cardiovascular:  Rate and Rhythm: Normal rate.  Skin:    General: Skin is warm and dry.  Neurological:     Mental Status: He is alert.    Vital Signs: BP  (!) 119/59 (BP Location: Left Arm)    Pulse 99    Temp 98.1 F (36.7 C) (Oral)    Resp 16    Ht 5\' 3"  (1.6 m)    Wt 79.8 kg    SpO2 96%    BMI 31.16 kg/m  Pain Scale: 0-10   Pain Score: 0-No pain   SpO2: SpO2: 96 % O2 Device:SpO2: 96 % O2 Flow Rate: .   IO: Intake/output summary:  Intake/Output Summary (Last 24 hours) at 07/12/2021 1416 Last data filed at 07/12/2021 1352 Gross per 24 hour  Intake 1972.67 ml  Output 5000 ml  Net -3027.33 ml    LBM: Last BM Date: 07/05/21 (per patient) Baseline Weight: Weight: 79.8 kg Most recent weight: Weight: 79.8 kg     Palliative Assessment/Data:   Flowsheet Rows    Flowsheet Row Most Recent Value  Intake Tab   Referral Department Hospitalist  Unit at Time of Referral Cardiac/Telemetry Unit  Palliative Care Primary Diagnosis Other (Comment)  Date Notified 07/11/21  Palliative Care Type New Palliative care  Reason for referral Clarify Goals of Care  Date of Admission 07/10/21  Date first seen by Palliative Care 07/12/21  # of days Palliative referral response time 1 Day(s)  # of days IP prior to Palliative referral 1  Clinical Assessment   Palliative Performance Scale Score 40%  Pain Max last 24 hours Not able to report  Pain Min Last 24 hours Not able to report  Dyspnea Max Last 24 Hours Not able to report  Dyspnea Min Last 24 hours Not able to report  Psychosocial & Spiritual Assessment   Palliative Care Outcomes        Time In: 1310 Time Out: 1425 Time Total: 75 minutes  Greater than 50%  of this time was spent counseling and coordinating care related to the above assessment and plan.  Signed by: Drue Novel, NP   Please contact Palliative Medicine Team phone at [phone] for questions and concerns.  For individual provider: See Shea Evans

## 2021-07-12 NOTE — Progress Notes (Addendum)
PROGRESS NOTE   George Valdez, is a 66 y.o. male, DOB - May 26, 1956, PJA:[id number]  Admit date - 07/10/2021   Admitting Physician George Hoit, DO  Outpatient Primary MD for the patient is Patient, No Pcp Per (Inactive)  LOS - 2  Chief Complaint  Patient presents with   Fall    Generalized weakness        Brief Narrative:  George Valdez is a 65 y.o. male with medical history significant for hypertension, CAD/PAD, prior stroke with subsequent right-sided weakness, hyperlipidemia, chronic disease stage IIIB admitted on 07/10/2021 with recurrent falls and rhabdomyolysis as well as strep G sepsis  Assessment & Plan:   Principal Problem:   Rhabdomyolysis Active Problems:   Bacteremia due to group B Streptococcus   E. coli UTI (urinary tract infection)   Acute kidney injury superimposed on CKD ([place])   Essential hypertension   CAD (coronary artery disease)   PAD (peripheral artery disease) ([facility])   Microcytic anemia   Hypokalemia   AKI (acute kidney injury) ([place])   Frequent falls   Generalized weakness   Failure to thrive in adult   Physical deconditioning   Transaminitis   Hypomagnesemia   Leukocytosis   Dehydration   Mixed hyperlipidemia   History of stroke   1)Streptococcal G Bacteremia Sepsis---- -WBC 22.8 >>18.4 >>14.7 -Continue IV Rocephin 07/12/21---TTE---shows Thickening of distal anterior MV leaflet likely represents  calcification, cannot exclude small vegetation. Consider TEE if clinical  suspicion of endocarditis. . -ID consult requested, ??? Need for TEE -ID Physician Dr Gale Journey rec--Please repeat blood culture,. If positive, or if there are evidence of embolic phenomenom. Or if the initial cultures all bottles grow would consider TEE  2)Rhabdomyolysis in the setting of frequent falls--- CPK trending down -Hydrating  3)E coli UTI--continue IV Rocephin  4)Recurrent falls--- CT abdomen and pelvis without acute intra-abdominal finding -Benzodiazepine may  be contributing to recurrent falls PTA pt lived at home and did very poorly, patient has significant limitations with mobility related ADLs- this patient needs to continue to be monitored in the hospital until a SNF bed is obtained as she is not safe to go home with her current physcical limitations -PT eval appreciated recommends SNF rehab  5)AKI----acute kidney injury on CKD stage -3B -Due to dehydration in the setting of recurrent falls and poor oral intake and rhabdomyolysis -- Continue to hydrate  renally adjust medications, avoid nephrotoxic agents / dehydration  / hypotension  6)CAD/PAD/history of stroke----Continue aspirin,  Hold Plavix as patient may need EGD and colonoscopy  ---- continue to hold Crestor due to rhabdomyolysis and transaminitis  6)COPD--continue bronchodilators  7)Transaminitis--- most likely due to rhabdomyolysis, monitor closely -Acute viral hepatitis panel pending INR 1.2 -Hold Crestor  8) hypomagnesemia/hyponatremia/hypokalemia--- dehydration and poor oral intake, hydrate and replace electrolytes  9) acute on chronic iron deficiency anemia-- Hgb was down to 6.7 transfuse, Hgb > 10 post transfusion of 2 units of PRBC -Would benefit from EGD and colonoscopy -GI consult appreciated -Protonix for now -Stool occult blood requested -No overt bleeding noted--- however PTA patient was on aspirin and Plavix  Disposition/Need for in-Hospital Stay- patient unable to be discharged at this time due to --- rhabdomyolysis and AKI requiring IV fluids, streptococcal sepsis requiring IV antibiotics  Status is: Inpatient  Remains inpatient appropriate because:   Disposition: The patient is from: Home              Anticipated d/c is to: SNF  Anticipated d/c date is: 2 days              Patient currently is not medically stable to d/c. Barriers: Not Clinically Stable-   Code Status :  -  Code Status: DNR   Family Communication:   (patient is alert,  awake and coherent)  Discussed with his wife  Consults  :  Gi/Palliative/ID  DVT Prophylaxis  :   - SCDs/?? GI bleed    Lab Results  Component Value Date   PLT 286 07/12/2021    Inpatient Medications  Scheduled Meds:  aspirin EC  81 mg Oral Daily   feeding supplement  237 mL Oral TID BM   multivitamin with minerals  1 tablet Oral Daily   pantoprazole (PROTONIX) IV  40 mg Intravenous Q24H   polyethylene glycol  17 g Oral BID   potassium chloride  40 mEq Oral Once   umeclidinium bromide  1 puff Inhalation Daily   Continuous Infusions:  cefTRIAXone (ROCEPHIN)  IV Stopped (07/11/21 2031)   dextrose 5 % and 0.9 % NaCl with KCl 20 mEq/L Stopped (07/11/21 2002)   PRN Meds:.albuterol, ALPRAZolam   Anti-infectives (From admission, onward)    Start     Dose/Rate Route Frequency Ordered Stop   07/10/21 2100  cefTRIAXone (ROCEPHIN) 2 g in sodium chloride 0.9 % 100 mL IVPB        2 g 200 mL/hr over 30 Minutes Intravenous Every 24 hours 07/10/21 2045 07/17/21 2059         Subjective: George Valdez today has no fevers, no emesis,  No chest pain,   - -Very weak , fell backwards onto the bed when trying to get up with myself and LPN -Drinking well  Objective: Vitals:   07/12/21 0439 07/12/21 0734 07/12/21 0815 07/12/21 1440  BP: (!) 84/65  (!) 119/59 (!) 131/59  Pulse: 97  99 (!) 102  Resp: 18  16 14   Temp: 98.1 F (36.7 C)   98 F (36.7 C)  TempSrc: Oral   Oral  SpO2: 97% 98% 96% 98%  Weight:      Height:        Intake/Output Summary (Last 24 hours) at 07/12/2021 1831 Last data filed at 07/12/2021 1352 Gross per 24 hour  Intake 1098.54 ml  Output 3200 ml  Net -2101.46 ml   Filed Weights   07/10/21 2047  Weight: 79.8 kg     Physical Exam  Gen:- Awake Alert, chronically ill-appearing HEENT:- [place].AT, No sclera icterus Neck-Supple Neck,No JVD,.  Lungs-diminished breath sounds without wheezing or rhonchi CV- S1, S2 normal, regular  Abd-  +ve  B.Sounds, Abd Soft, No tenderness,    Extremity/Skin:- No  edema, pedal pulses present  Psych-affect is appropriate, oriented x3 Neuro-Generalized weakness, no new focal deficits, no tremors  Data Reviewed: I have personally reviewed following labs and imaging studies  CBC: Recent Labs  Lab 07/10/21 2048 07/11/21 0550 07/12/21 0523  WBC 22.8* 18.4* 14.7*  NEUTROABS 20.9*  --   --   HGB 8.3* 6.7* 10.3*  HCT 26.6* 22.3* 32.3*  MCV 78.5* 79.4* 80.0  PLT 363 322 017   Basic Metabolic Panel: Recent Labs  Lab 07/10/21 2048 07/11/21 0550 07/12/21 0523  NA 125* 132* 139  K 2.2* 2.3* 2.4*  CL 96* 106 113*  CO2 17* 16* 17*  GLUCOSE 95 102* 111*  BUN 39* 33* 31*  CREATININE 3.08* 2.60* 2.33*  CALCIUM 8.2* 7.5* 7.8*  MG 1.6* 2.0 2.0  PHOS  --  3.5  --    GFR: Estimated Creatinine Clearance: 29.2 mL/min (A) (by C-G formula based on SCr of 2.33 mg/dL (H)). Liver Function Tests: Recent Labs  Lab 07/10/21 2048 07/11/21 0550 07/12/21 0523  AST 166* 160* 177*  ALT 161* 135* 138*  ALKPHOS 83 71 71  BILITOT 0.6 0.4 0.6  PROT 6.4* 5.5* 5.9*  ALBUMIN 2.4* 2.0* 2.0*   No results for input(s): LIPASE, AMYLASE in the last 168 hours. Recent Labs  Lab 07/10/21 2112  AMMONIA 15   Coagulation Profile: Recent Labs  Lab 07/10/21 2048 07/12/21 1247  INR 1.1 1.2   Cardiac Enzymes: Recent Labs  Lab 07/10/21 2048 07/11/21 0550  CKTOTAL 3,313* 3,797*   BNP (last 3 results) No results for input(s): PROBNP in the last 8760 hours. HbA1C: No results for input(s): HGBA1C in the last 72 hours. CBG: Recent Labs  Lab 07/10/21 2048  GLUCAP 95   Lipid Profile: No results for input(s): CHOL, HDL, LDLCALC, TRIG, CHOLHDL, LDLDIRECT in the last 72 hours. Thyroid Function Tests: No results for input(s): TSH, T4TOTAL, FREET4, T3FREE, THYROIDAB in the last 72 hours. Anemia Panel: Recent Labs    07/10/21 2048 07/11/21 0550  VITAMINB12 689  --   FERRITIN  --  124  TIBC  --  266   IRON  --  11*   Urine analysis:    Component Value Date/Time   COLORURINE YELLOW 07/10/2021 2146   APPEARANCEUR CLEAR 07/10/2021 2146   LABSPEC 1.015 07/10/2021 2146   PHURINE 6.0 07/10/2021 2146   GLUCOSEU NEGATIVE 07/10/2021 2146   HGBUR LARGE (A) 07/10/2021 2146   BILIRUBINUR NEGATIVE 07/10/2021 2146   [place] NEGATIVE 07/10/2021 2146   PROTEINUR 100 (A) 07/10/2021 2146   UROBILINOGEN 0.2 09/10/2014 0908   NITRITE NEGATIVE 07/10/2021 2146   LEUKOCYTESUR SMALL (A) 07/10/2021 2146   Sepsis Labs: @LABRCNTIP (procalcitonin:4,lacticidven:4)  ) Recent Results (from the past 240 hour(s))  Blood Culture (routine x 2)     Status: Abnormal (Preliminary result)   Collection Time: 07/10/21  9:13 PM   Specimen: BLOOD RIGHT ARM  Result Value Ref Range Status   Specimen Description   Final    BLOOD RIGHT ARM BOTTLES DRAWN AEROBIC ONLY Performed at [facility], [address], [place], [place] [address]    Special Requests   Final    Blood Culture adequate volume Performed at [facility], [address], [place], [place] [address]    Culture  Setup Time   Final    GRAM POSITIVE COCCI Gram Stain Report Called to,Read Back By and Verified With: Mellody Drown @ 1231 07/11/21 BY STEPHTR AEROBIC BOTTLE ONLY Organism ID to follow CRITICAL RESULT CALLED TO, READ BACK BY AND VERIFIED WITHMellody Drown RN [phone] 07/11/21 A BROWNING    Culture (A)  Final    STREPTOCOCCUS GROUP G SUSCEPTIBILITIES TO FOLLOW Performed at [facility], [place] [address] [place], [place] [address]    Report Status PENDING  Incomplete  Blood Culture ID Panel (Reflexed)     Status: Abnormal   Collection Time: 07/10/21  9:13 PM  Result Value Ref Range Status   Enterococcus faecalis NOT DETECTED NOT DETECTED Final   Enterococcus Faecium NOT DETECTED NOT DETECTED Final   Listeria monocytogenes NOT DETECTED NOT DETECTED Final   Staphylococcus species NOT DETECTED NOT DETECTED Final   Staphylococcus aureus (BCID)  NOT DETECTED NOT DETECTED Final   Staphylococcus epidermidis NOT DETECTED NOT DETECTED Final   Staphylococcus lugdunensis  NOT DETECTED NOT DETECTED Final   Streptococcus species DETECTED (A) NOT DETECTED Final    Comment: Not Enterococcus species, Streptococcus agalactiae, Streptococcus pyogenes, or Streptococcus pneumoniae. CRITICAL RESULT CALLED TO, READ BACK BY AND VERIFIED WITHMellody Drown RN [phone] 07/11/21 A BROWNING    Streptococcus agalactiae NOT DETECTED NOT DETECTED Final   Streptococcus pneumoniae NOT DETECTED NOT DETECTED Final   Streptococcus pyogenes NOT DETECTED NOT DETECTED Final   A.calcoaceticus-baumannii NOT DETECTED NOT DETECTED Final   Bacteroides fragilis NOT DETECTED NOT DETECTED Final   Enterobacterales NOT DETECTED NOT DETECTED Final   Enterobacter cloacae complex NOT DETECTED NOT DETECTED Final   Escherichia coli NOT DETECTED NOT DETECTED Final   Klebsiella aerogenes NOT DETECTED NOT DETECTED Final   Klebsiella oxytoca NOT DETECTED NOT DETECTED Final   Klebsiella pneumoniae NOT DETECTED NOT DETECTED Final   Proteus species NOT DETECTED NOT DETECTED Final   Salmonella species NOT DETECTED NOT DETECTED Final   Serratia marcescens NOT DETECTED NOT DETECTED Final   Haemophilus influenzae NOT DETECTED NOT DETECTED Final   Neisseria meningitidis NOT DETECTED NOT DETECTED Final   Pseudomonas aeruginosa NOT DETECTED NOT DETECTED Final   Stenotrophomonas maltophilia NOT DETECTED NOT DETECTED Final   Candida albicans NOT DETECTED NOT DETECTED Final   Candida auris NOT DETECTED NOT DETECTED Final   Candida glabrata NOT DETECTED NOT DETECTED Final   Candida krusei NOT DETECTED NOT DETECTED Final   Candida parapsilosis NOT DETECTED NOT DETECTED Final   Candida tropicalis NOT DETECTED NOT DETECTED Final   Cryptococcus neoformans/gattii NOT DETECTED NOT DETECTED Final    Comment: Performed at [facility], [address] [place], [place] [address]  Blood Culture  (routine x 2)     Status: None (Preliminary result)   Collection Time: 07/10/21  9:24 PM   Specimen: BLOOD LEFT HAND  Result Value Ref Range Status   Specimen Description BLOOD LEFT HAND BOTTLES DRAWN AEROBIC ONLY  Final   Special Requests Blood Culture adequate volume  Final   Culture   Final    NO GROWTH < 12 HOURS Performed at [facility], [address], [place], [place] [address]    Report Status PENDING  Incomplete  Urine Culture     Status: Abnormal (Preliminary result)   Collection Time: 07/10/21  9:46 PM   Specimen: Urine, Clean Catch  Result Value Ref Range Status   Specimen Description   Final    URINE, CLEAN CATCH Performed at [facility], [address] [place], [place] [address]    Special Requests   Final    NONE Performed at [facility], [address] [place], [place] [address]    Culture (A)  Final    >=100,000 COLONIES/mL ESCHERICHIA COLI SUSCEPTIBILITIES TO FOLLOW Performed at [facility], [place] [address] [place], [place] [address]    Report Status PENDING  Incomplete  Resp Panel by RT-PCR (Flu A&B, Covid) Nasopharyngeal Swab     Status: None   Collection Time: 07/10/21  9:54 PM   Specimen: Nasopharyngeal Swab; Nasopharyngeal(NP) swabs in vial transport medium  Result Value Ref Range Status   SARS Coronavirus 2 by RT PCR NEGATIVE NEGATIVE Final    Comment: (NOTE) SARS-CoV-2 target nucleic acids are NOT DETECTED.  The SARS-CoV-2 RNA is generally detectable in upper respiratory specimens during the acute phase of infection. The lowest concentration of SARS-CoV-2 viral copies this assay can detect is 138 copies/mL. A negative result does not preclude SARS-Cov-2 infection and should not  be used as the sole basis for treatment or other patient management decisions. A negative result may occur with  improper specimen collection/handling, submission of specimen other than nasopharyngeal swab, presence of viral mutation(s) within the areas targeted by  this assay, and inadequate number of viral copies(<138 copies/mL). A negative result must be combined with clinical observations, patient history, and epidemiological information. The expected result is Negative.  Fact Sheet for Patients:  EntrepreneurPulse.com.au  Fact Sheet for Healthcare Providers:  IncredibleEmployment.be  This test is no t yet approved or cleared by the [place] FDA and  has been authorized for detection and/or diagnosis of SARS-CoV-2 by FDA under an Emergency Use Authorization (EUA). This EUA will remain  in effect (meaning this test can be used) for the duration of the COVID-19 declaration under Section 564(b)(1) of the Act, 21 U.S.C.section 360bbb-3(b)(1), unless the authorization is terminated  or revoked sooner.       Influenza A by PCR NEGATIVE NEGATIVE Final   Influenza B by PCR NEGATIVE NEGATIVE Final    Comment: (NOTE) The Xpert Xpress SARS-CoV-2/FLU/RSV plus assay is intended as an aid in the diagnosis of influenza from Nasopharyngeal swab specimens and should not be used as a sole basis for treatment. Nasal washings and aspirates are unacceptable for Xpert Xpress SARS-CoV-2/FLU/RSV testing.  Fact Sheet for Patients: EntrepreneurPulse.com.au  Fact Sheet for Healthcare Providers: IncredibleEmployment.be  This test is not yet approved or cleared by the [place] FDA and has been authorized for detection and/or diagnosis of SARS-CoV-2 by FDA under an Emergency Use Authorization (EUA). This EUA will remain in effect (meaning this test can be used) for the duration of the COVID-19 declaration under Section 564(b)(1) of the Act, 21 U.S.C. section 360bbb-3(b)(1), unless the authorization is terminated or revoked.  Performed at [facility], [address] [place], [place] [address]       Radiology Studies: CT ABDOMEN PELVIS WO CONTRAST  Result Date:  07/10/2021 CLINICAL DATA:  [place], generalized weakness EXAM: CT ABDOMEN AND PELVIS WITHOUT CONTRAST TECHNIQUE: Multidetector CT imaging of the abdomen and pelvis was performed following the standard protocol without IV contrast. Unenhanced CT was performed per clinician order. Lack of IV contrast limits sensitivity and specificity, especially for evaluation of abdominal/pelvic solid viscera. RADIATION DOSE REDUCTION: This exam was performed according to the departmental dose-optimization program which includes automated exposure control, adjustment of the mA and/or kV according to patient size and/or use of iterative reconstruction technique. COMPARISON:  None. FINDINGS: Lower chest: Bibasilar scarring and fibrosis. No acute pleural or parenchymal lung disease. Hepatobiliary: Unremarkable unenhanced appearance of the liver and gallbladder. Pancreas: Unremarkable unenhanced appearance. Spleen: Unremarkable unenhanced appearance. Adrenals/Urinary Tract: Low-attenuation nodular thickening left adrenal gland consistent with hyperplasia or adenoma. Right adrenals unremarkable. No urinary tract calculi or obstructive uropathy within either kidney. Vascular calcifications are seen at the renal hila. The bladder is unremarkable. Stomach/Bowel: No bowel obstruction or ileus. Large amount of retained stool within the rectal vault may reflect fecal impaction. Normal appendix right lower quadrant. No bowel wall thickening or inflammatory change. Vascular/Lymphatic: Diffuse aortic atherosclerosis. Evaluation of the vascular lumen limited by the lack of IV contrast. No pathologic adenopathy. Reproductive: Prostate is not enlarged. Testicles are retracted into the distal inguinal canals bilaterally. Other: No free intraperitoneal fluid or free gas. No abdominal wall hernia. Musculoskeletal: Age-indeterminate fracture of the right fourth costochondral junction, only partially visualized on this study. Chronic right posterolateral  eleventh rib fracture with moderate callus formation. No other acute bony  abnormalities. Subcutaneous fat stranding in the right gluteal region may reflect direct trauma and ecchymosis. No evidence of hematoma. Reconstructed images demonstrate no additional findings. IMPRESSION: 1. Subcutaneous fat stranding right gluteal region which may reflect direct trauma and ecchymosis. 2. Age indeterminate fracture of the right fourth costochondral junction, only partially visualized on this study. Chronic right posterolateral eleventh rib fracture. 3. Otherwise no acute intra-abdominal crash that marked retained stool within the distal colon, compatible with fecal impaction. No bowel obstruction or ileus. 4.  Aortic Atherosclerosis (ICD10-I70.0). Electronically Signed   By: Randa Ngo M.D.   On: 07/10/2021 22:37   CT Head Wo Contrast  Result Date: 07/10/2021 CLINICAL DATA:  Recent fall with weakness, initial encounter EXAM: CT HEAD WITHOUT CONTRAST TECHNIQUE: Contiguous axial images were obtained from the base of the skull through the vertex without intravenous contrast. RADIATION DOSE REDUCTION: This exam was performed according to the departmental dose-optimization program which includes automated exposure control, adjustment of the mA and/or kV according to patient size and/or use of iterative reconstruction technique. COMPARISON:  08/22/2016 FINDINGS: Brain: No evidence of acute infarction, hemorrhage, hydrocephalus, extra-axial collection or mass lesion/mass effect. 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 Scattered lacunar infarcts in the basal ganglia bilaterally are again noted. Vascular: No hyperdense vessel or unexpected calcification. Skull: Normal. Negative for fracture or focal lesion. Sinuses/Orbits: No acute finding. Other: None. IMPRESSION: Chronic  ischemic changes without acute abnormality. Electronically Signed   By: [name] M.D.   On:  07/10/2021 21:10   DG Chest Port 1 View  Result Date: 07/10/2021 CLINICAL DATA:  cough, fall EXAM: PORTABLE CHEST 1 VIEW COMPARISON:  Chest x-ray 08/22/2016 FINDINGS: The heart and mediastinal contours are unchanged. Aortic calcification No focal consolidation. No pulmonary edema. No pleural effusion. No pneumothorax. No acute osseous abnormality. IMPRESSION: No active disease. Electronically Signed   By: Iven Finn M.D.   On: 07/10/2021 21:11   ECHOCARDIOGRAM COMPLETE  Result Date: 07/12/2021    ECHOCARDIOGRAM REPORT   Patient Name:   George Valdez Fazzini Date of Exam: 07/12/2021 Medical Rec #:  [id number]              Height:       63.0 in Accession #:    [id number]             Weight:       175.9 lb Date of Birth:  1955/09/25              BSA:          1.831 m Patient Age:    89 years               BP:           119/59 mmHg Patient Gender: M                      HR:           99 bpm. Exam Location:  [facility] Procedure: 2D Echo, Cardiac Doppler and Color Doppler Indications:    Bacteremia  History:        Patient has prior history of Echocardiogram examinations, most                 recent 12/04/2017. CAD, PAD and Stroke; Risk                 Factors:Hypertension.  Sonographer:    Wenda Low Referring  Phys: [id number] Nyna Chilton IMPRESSIONS  1. Left ventricular ejection fraction, by estimation, is 55 to 60%. The left ventricle has normal function. The left ventricle has no regional wall motion abnormalities. Left ventricular diastolic parameters are consistent with Grade I diastolic dysfunction (impaired relaxation).  2. Right ventricular systolic function is normal. The right ventricular size is normal. Tricuspid regurgitation signal is inadequate for assessing PA pressure.  3. Thickening of distal anterior MV leaflet likely represents calcification, cannot exclude small vegetation. Consider TEE if clinical suspicion of endocarditis. . The mitral valve is abnormal. Mild mitral valve  regurgitation. No evidence of mitral stenosis.  4. The aortic valve is tricuspid. There is moderate calcification of the aortic valve. There is moderate thickening of the aortic valve. Aortic valve regurgitation is not visualized. No aortic stenosis is present.  5. The inferior vena cava is normal in size with greater than 50% respiratory variability, suggesting right atrial pressure of 3 mmHg. FINDINGS  Left Ventricle: Left ventricular ejection fraction, by estimation, is 55 to 60%. The left ventricle has normal function. The left ventricle has no regional wall motion abnormalities. The left ventricular internal cavity size was normal in size. There is  no left ventricular hypertrophy. Left ventricular diastolic parameters are consistent with Grade I diastolic dysfunction (impaired relaxation). Normal left ventricular filling pressure. Right Ventricle: The right ventricular size is normal. No increase in right ventricular wall thickness. Right ventricular systolic function is normal. Tricuspid regurgitation signal is inadequate for assessing PA pressure. Left Atrium: Left atrial size was normal in size. Right Atrium: Right atrial size was normal in size. Pericardium: There is no evidence of pericardial effusion. Mitral Valve: Thickening of distal anterior MV leaflet likely represents calcification, cannot exclude small vegetation. Consider TEE if clinical suspicion of endocarditis. The mitral valve is abnormal. There is mild thickening of the mitral valve leaflet(s). There is mild calcification of the mitral valve leaflet(s). Mild mitral annular calcification. Mild mitral valve regurgitation. No evidence of mitral valve stenosis. MV peak gradient, 4.1 mmHg. The mean mitral valve gradient is 2.0 mmHg. Tricuspid Valve: The tricuspid valve is normal in structure. Tricuspid valve regurgitation is not demonstrated. No evidence of tricuspid stenosis. Aortic Valve: The aortic valve is tricuspid. There is moderate  calcification of the aortic valve. There is moderate thickening of the aortic valve. There is moderate aortic valve annular calcification. Aortic valve regurgitation is not visualized. No aortic stenosis is present. Aortic valve mean gradient measures 3.0 mmHg. Aortic valve peak gradient measures 7.8 mmHg. Aortic valve area, by VTI measures 1.97 cm. Pulmonic Valve: The pulmonic valve was not well visualized. Pulmonic valve regurgitation is not visualized. No evidence of pulmonic stenosis. Aorta: The aortic root is normal in size and structure and the ascending aorta was not well visualized. Venous: The inferior vena cava is normal in size with greater than 50% respiratory variability, suggesting right atrial pressure of 3 mmHg. IAS/Shunts: No atrial level shunt detected by color flow Doppler.  LEFT VENTRICLE PLAX 2D LVIDd:         4.15 cm     Diastology LVIDs:         3.05 cm     LV e' medial:    6.31 cm/s LV PW:         1.00 cm     LV E/e' medial:  14.5 LV IVS:        1.20 cm     LV e' lateral:   9.03 cm/s LVOT diam:  1.90 cm     LV E/e' lateral: 10.1 LV SV:         52 LV SV Index:   28 LVOT Area:     2.84 cm  LV Volumes (MOD) LV vol d, MOD A2C: 70.7 ml LV vol d, MOD A4C: 54.9 ml LV vol s, MOD A2C: 34.3 ml LV vol s, MOD A4C: 27.0 ml LV SV MOD A2C:     36.4 ml LV SV MOD A4C:     54.9 ml LV SV MOD BP:      34.1 ml RIGHT VENTRICLE RV Basal diam:  2.65 cm RV Mid diam:    2.10 cm RV S prime:     13.40 cm/s TAPSE (M-mode): 2.0 cm LEFT ATRIUM             Index        RIGHT ATRIUM           Index LA diam:        3.60 cm 1.97 cm/m   RA Area:     13.60 cm LA Vol (A2C):   31.8 ml 17.37 ml/m  RA Volume:   27.40 ml  14.96 ml/m LA Vol (A4C):   42.9 ml 23.43 ml/m LA Biplane Vol: 38.2 ml 20.86 ml/m  AORTIC VALVE                    PULMONIC VALVE AV Area (Vmax):    1.82 cm     PV Vmax:       1.08 m/s AV Area (Vmean):   1.70 cm     PV Peak grad:  4.7 mmHg AV Area (VTI):     1.97 cm AV Vmax:           140.00 cm/s AV  Vmean:          83.300 cm/s AV VTI:            0.263 m AV Peak Grad:      7.8 mmHg AV Mean Grad:      3.0 mmHg LVOT Vmax:         90.00 cm/s LVOT Vmean:        49.800 cm/s LVOT VTI:          0.183 m LVOT/AV VTI ratio: 0.70  AORTA Ao Root diam: 2.90 cm MITRAL VALVE MV Area (PHT): 3.77 cm     SHUNTS MV Area VTI:   1.49 cm     Systemic VTI:  0.18 m MV Peak grad:  4.1 mmHg     Systemic Diam: 1.90 cm MV Mean grad:  2.0 mmHg MV Vmax:       1.01 m/s MV Vmean:      65.4 cm/s MV Decel Time: 201 msec MV E velocity: 91.30 cm/s MV A velocity: 103.00 cm/s MV E/A ratio:  0.89 Carlyle Dolly MD Electronically signed by Carlyle Dolly MD Signature Date/Time: 07/12/2021/11:14:15 AM    Final      Scheduled Meds:  aspirin EC  81 mg Oral Daily   feeding supplement  237 mL Oral TID BM   multivitamin with minerals  1 tablet Oral Daily   pantoprazole (PROTONIX) IV  40 mg Intravenous Q24H   polyethylene glycol  17 g Oral BID   potassium chloride  40 mEq Oral Once   umeclidinium bromide  1 puff Inhalation Daily   Continuous Infusions:  cefTRIAXone (ROCEPHIN)  IV Stopped (07/11/21 2031)   dextrose 5 % and 0.9 % NaCl with  KCl 20 mEq/L Stopped (07/11/21 2002)     LOS: 2 days    Roxan Hockey M.D on 07/12/2021 at 6:31 PM  Go to [email or web address] - for contact info  [facility] - Office  [phone]  If 7PM-7AM, please contact night-coverage [email or web address] Password [facility] 07/12/2021, 6:31 PM

## 2021-07-12 NOTE — Plan of Care (Signed)
°  Problem: Acute Rehab OT Goals (only OT should resolve) Goal: Pt. Will Perform Grooming Flowsheets (Taken 07/12/2021 1016) Pt Will Perform Grooming:  with modified independence  sitting Goal: Pt. Will Perform Upper Body Bathing Flowsheets (Taken 07/12/2021 1016) Pt Will Perform Upper Body Bathing:  with modified independence  sitting Goal: Pt. Will Perform Lower Body Bathing Flowsheets (Taken 07/12/2021 1016) Pt Will Perform Lower Body Bathing:  with supervision  sitting/lateral leans Goal: Pt. Will Perform Upper Body Dressing Flowsheets (Taken 07/12/2021 1016) Pt Will Perform Upper Body Dressing:  with modified independence  sitting Goal: Pt. Will Perform Lower Body Dressing Flowsheets (Taken 07/12/2021 1016) Pt Will Perform Lower Body Dressing:  with supervision  sitting/lateral leans Goal: Pt. Will Transfer To Toilet Flowsheets (Taken 07/12/2021 1016) Pt Will Transfer to Toilet:  with supervision  bedside commode  squat pivot transfer Goal: Pt. Will Perform Toileting-Clothing Manipulation Flowsheets (Taken 07/12/2021 1016) Pt Will Perform Toileting - Clothing Manipulation and hygiene:  with supervision  sitting/lateral leans Goal: Pt/Caregiver Will Perform Home Exercise Program Flowsheets (Taken 07/12/2021 1016) Pt/caregiver will Perform Home Exercise Program:  Increased strength  Both right and left upper extremity  With Supervision  With written HEP provided

## 2021-07-12 NOTE — NC FL2 (Signed)
[place] LEVEL OF CARE SCREENING TOOL     IDENTIFICATION  Patient Name: George Valdez Birthdate: 25-Dec-1955 Sex: male Admission Date (Current Location): 07/10/2021  [facility] and [place] Number:  [employer] and Address:  [place] [address] [place]      Provider Number: [id number]  Attending Physician Name and Address:  Roxan Hockey, MD  Relative Name and Phone Number:       Current Level of Care: Hospital Recommended Level of Care: [place] Prior Approval Number:    Date Approved/Denied:   PASRR Number: [id number]  Discharge Plan: SNF    Current Diagnoses: Patient Active Problem List   Diagnosis Date Noted   Rhabdomyolysis 07/10/2021   Generalized weakness 07/10/2021   Failure to thrive in adult 07/10/2021   Physical deconditioning 07/10/2021   Transaminitis 07/10/2021   Hypomagnesemia 07/10/2021   Leukocytosis 07/10/2021   Dehydration 07/10/2021   Mixed hyperlipidemia 07/10/2021   History of stroke 07/10/2021   Altered mental status 08/22/2016   Aspiration pneumonia ([place]) 08/22/2016   UTI (urinary tract infection) 08/22/2016   Protein-calorie malnutrition, severe 07/30/2016   Falls 07/22/2016   Weakness of left side of body 07/22/2016   h/o CVA (cerebral vascular accident) [facility]) with left sided weakness 07/22/2016   Alcohol abuse 07/22/2016   Hypotension 07/22/2016   Frequent falls    Microcytic anemia 11/30/2015   Hypokalemia 11/30/2015   AKI (acute kidney injury) ([place]) 11/30/2015   Acute encephalopathy 09/10/2014   Alcohol withdrawal delirium ([place]) 09/10/2014   Acute kidney injury superimposed on CKD ([place]) 09/10/2014   Sinus bradycardia 09/10/2014   Essential hypertension 09/10/2014   CAD (coronary artery disease) 09/10/2014   PAD (peripheral artery disease) ([place]) 09/10/2014   Pernicious anemia 09/10/2014    Orientation RESPIRATION BLADDER Height & Weight      Self, Time, Situation, Place  Normal Continent Weight: 175 lb 14.8 oz (79.8 kg) Height:  5\' 3"  (160 cm)  BEHAVIORAL SYMPTOMS/MOOD NEUROLOGICAL BOWEL NUTRITION STATUS      Continent Diet (see dc summary)  AMBULATORY STATUS COMMUNICATION OF NEEDS Skin   Extensive Assist Verbally Normal                       Personal Care Assistance Level of Assistance  Bathing, Feeding, Dressing Bathing Assistance: Limited assistance Feeding assistance: Independent Dressing Assistance: Limited assistance     Functional Limitations Info  Sight, Hearing, Speech Sight Info: Adequate Hearing Info: Adequate Speech Info: Adequate    SPECIAL CARE FACTORS FREQUENCY  PT (By licensed PT), OT (By licensed OT)     PT Frequency: 5x week OT Frequency: 3x week            Contractures Contractures Info: Not present    Additional Factors Info  Code Status, Allergies Code Status Info: Full Allergies Info: Codeine           Current Medications (07/12/2021):  This is the current hospital active medication list Current Facility-Administered Medications  Medication Dose Route Frequency Provider Last Rate Last Admin   albuterol (PROVENTIL) (2.5 MG/3ML) 0.083% nebulizer solution 3 mL  3 mL Nebulization Q6H PRN Adefeso, Oladapo, DO       ALPRAZolam (XANAX) tablet 0.5 mg  0.5 mg Oral BID PRN Emokpae, Courage, MD   0.5 mg at 07/11/21 1240   aspirin EC tablet 81 mg  81 mg Oral Daily Adefeso, Oladapo, DO   81 mg at 07/12/21 [phone]  cefTRIAXone (ROCEPHIN) 2 g in sodium chloride 0.9 % 100 mL IVPB  2 g Intravenous Q24H Adefeso, Oladapo, DO   Stopped at 07/11/21 2031   dextrose 5 % and 0.9 % NaCl with KCl 20 mEq/L infusion   Intravenous Continuous Roxan Hockey, MD   Stopped at 07/11/21 2002   feeding supplement (ENSURE ENLIVE / ENSURE PLUS) liquid 237 mL  237 mL Oral TID BM Emokpae, Courage, MD   237 mL at 07/12/21 1102   multivitamin with minerals tablet 1 tablet  1 tablet Oral Daily Emokpae, Courage, MD    1 tablet at 07/12/21 0826   pantoprazole (PROTONIX) injection 40 mg  40 mg Intravenous Q24H Emokpae, Courage, MD   40 mg at 07/11/21 1959   polyethylene glycol (MIRALAX / GLYCOLAX) packet 17 g  17 g Oral BID Annitta Needs, NP       umeclidinium bromide (INCRUSE ELLIPTA) 62.5 MCG/ACT 1 puff  1 puff Inhalation Daily Roxan Hockey, MD   1 puff at 07/12/21 0734     Discharge Medications: Please see discharge summary for a list of discharge medications.  Relevant Imaging Results:  Relevant Lab Results:   Additional Information SSN: [id number] [place]

## 2021-07-12 NOTE — Plan of Care (Signed)
°  Problem: Acute Rehab PT Goals(only PT should resolve) Goal: Pt Will Go Supine/Side To Sit Outcome: Progressing Flowsheets (Taken 07/12/2021 1353) Pt will go Supine/Side to Sit:  with min guard assist  with minimal assist Goal: Patient Will Transfer Sit To/From Stand Outcome: Progressing Flowsheets (Taken 07/12/2021 1353) Patient will transfer sit to/from stand: with min guard assist Goal: Pt Will Transfer Bed To Chair/Chair To Bed Outcome: Progressing Flowsheets (Taken 07/12/2021 1353) Pt will Transfer Bed to Chair/Chair to Bed: min guard assist Goal: Pt Will Ambulate Outcome: Progressing Flowsheets (Taken 07/12/2021 1353) Pt will Ambulate:  15 feet  with minimal assist  with moderate assist  with rolling walker   1:54 PM, 07/12/21 Lonell Grandchild, MPT Physical Therapist with [facility] [phone] office [phone] mobile phone

## 2021-07-12 NOTE — Consult Note (Signed)
Referring Provider: Dr. Roxan Hockey  Primary Care Physician:  Patient, No Pcp Per (Inactive) Primary Gastroenterologist:  Dr. Abbey Chatters (previously GI in [place], [place])  Date of Admission: 07/10/21 Date of Consultation: 07/12/21  Reason for Consultation:  Anemia, question GI blood loss  HPI:  ABRIAN HANOVER is a 66 y.o. year old male with history of CAD, PAD, prior stroke in the past, CKD, presenting via EMS after being found in debilitating conditions without oral intake for several days and multiple falls. Majority of information was obtained from chart, as patient is a difficult historian. His wife was also admitted. Chief complaint of weakness. Found to have Hgb 8.3, with last record in chart Hgb in the 11/12 range. Hgb down to 6.7 yesterday morning. Ferritin 124, iron low at 11. Unknown hemoccult status. Received 2 units PRBCs with improvement to 10.3 this morning. Transaminases also elevated with ALT 161, AST 166. CT without contrast revealed subcutaneous fat stranding right gluteal region which could reflect direct trauma, retained stool within distal colon. No obstruction or ileus. Other findings as below. He was admitted with streptococcal sepsis, rhabdomyolysis, AKI on CKD, and multiple metabolic derangements in setting of dehydration and failure to thrive. GI consulted due to anemia.   Patient is a limited historian. He reports decreased appetite for 3-4 days prior to admission. States he had been urinating the bed. Believes he had diarrhea prior to admission. No vomiting, no nausea. No overt GI bleeding. No dysphagia. Poor appetite. No abdominal pain. Reports history of chronic constipation. He notes prior colonoscopy/EGD in [place], [place] around 8 years ago. Denies current ETOH use. Smokes daily but states "small amount". Rare Ibuprofen. He is on Plavix and aspirin. Unclear when last dose was taken.   He has had bowel incontinence overnight but no diarrhea. Fecal soiling. Brown stool  seen by myself. No melena or hematochezia.   ECHO today unable to rule out small vegetation. Consider TEE.   Past Medical History:  Diagnosis Date   Alcohol abuse    Anxiety    BPH (benign prostatic hyperplasia)    Chronic pain    Coronary artery disease    Depression    Frequent falls    Hyperlipemia    Hypertension    Neuropathy    Pernicious anemia    Stroke ([place])    left sided weakness    Past Surgical History:  Procedure Laterality Date   CARDIAC CATHETERIZATION     CAROTID ENDARTERECTOMY      Prior to Admission medications   Medication Sig Start Date End Date Taking? Authorizing Provider  albuterol (PROVENTIL HFA;VENTOLIN HFA) 108 (90 BASE) MCG/ACT inhaler Inhale 2 puffs into the lungs every 6 (six) hours as needed for wheezing or shortness of breath.    [provider]  ALPRAZolam Duanne Moron) 1 MG tablet SMARTSIG:1 Tablet(s) By Mouth 1-3 Times Daily PRN 06/07/21   [provider]  aspirin EC 81 MG tablet Take 81 mg by mouth daily. Reported on 11/16/2015    [provider]  chlordiazePOXIDE (LIBRIUM) 5 MG capsule Take 1 capsule (5 mg total) by mouth 2 (two) times daily as needed for anxiety. 08/23/16   Kathie Dike, MD  clopidogrel (PLAVIX) 75 MG tablet Take 75 mg by mouth daily.    [provider]  cyanocobalamin (,VITAMIN B-12,) 1000 MCG/ML injection Inject 1,000 mcg into the muscle every 30 (thirty) days.    [provider]  DULoxetine (CYMBALTA) 60 MG capsule Take 60 mg by mouth  daily.    [provider]  ezetimibe (ZETIA) 10 MG tablet Take 1 tablet (10 mg total) by mouth daily. 10/07/19 01/05/20  Herminio Commons, MD  folic acid (FOLVITE) 1 MG tablet Take 1 tablet (1 mg total) by mouth daily. 08/04/16   Isaac Bliss, Rayford Halsted, MD  gabapentin (NEURONTIN) 300 MG capsule Take 300 mg by mouth 3 (three) times daily. 04/20/21   [provider]  gabapentin (NEURONTIN) 800 MG tablet Take 800 mg by mouth 3  (three) times daily.    [provider]  HYDROcodone-acetaminophen (NORCO/VICODIN) 5-325 MG tablet Take 1 tablet by mouth every 6 (six) hours as needed for moderate pain.    [provider]  isosorbide mononitrate (ISMO,MONOKET) 20 MG tablet Take 10 mg by mouth daily.    [provider]  metoprolol succinate (TOPROL-XL) 50 MG 24 hr tablet Take 50 mg by mouth 2 (two) times daily. Take with or immediately following a meal.    [provider]  Multiple Vitamin (MULTIVITAMIN WITH MINERALS) TABS tablet Take 1 tablet by mouth daily. 09/12/14   Samuella Cota, MD  potassium chloride 20 MEQ TBCR Take 20 mEq by mouth daily. 08/23/16   Kathie Dike, MD  potassium chloride SA (KLOR-CON M) 20 MEQ tablet Take 20 mEq by mouth daily. 06/02/21   [provider]  rosuvastatin (CRESTOR) 40 MG tablet Take 40 mg by mouth daily.    [provider]  thiamine 100 MG tablet Take 1 tablet (100 mg total) by mouth daily. 12/03/15   Orvan Falconer, MD  tiotropium (SPIRIVA) 18 MCG inhalation capsule Place 18 mcg into inhaler and inhale daily.     [provider]  vitamin E 400 UNIT capsule Take 400 Units by mouth daily.    [provider]    Current Facility-Administered Medications  Medication Dose Route Frequency Provider Last Rate Last Admin   albuterol (PROVENTIL) (2.5 MG/3ML) 0.083% nebulizer solution 3 mL  3 mL Nebulization Q6H PRN Adefeso, Oladapo, DO       ALPRAZolam (XANAX) tablet 0.5 mg  0.5 mg Oral BID PRN Emokpae, Courage, MD   0.5 mg at 07/11/21 1240   aspirin EC tablet 81 mg  81 mg Oral Daily Adefeso, Oladapo, DO   81 mg at 07/12/21 0826   cefTRIAXone (ROCEPHIN) 2 g in sodium chloride 0.9 % 100 mL IVPB  2 g Intravenous Q24H Adefeso, Oladapo, DO   Stopped at 07/11/21 2031   dextrose 5 % and 0.9 % NaCl with KCl 20 mEq/L infusion   Intravenous Continuous Roxan Hockey, MD   Stopped at 07/11/21 2002   feeding supplement (ENSURE ENLIVE / ENSURE  PLUS) liquid 237 mL  237 mL Oral TID BM Emokpae, Courage, MD       multivitamin with minerals tablet 1 tablet  1 tablet Oral Daily Emokpae, Courage, MD   1 tablet at 07/12/21 0826   pantoprazole (PROTONIX) injection 40 mg  40 mg Intravenous Q24H Emokpae, Courage, MD   40 mg at 07/11/21 1959   potassium chloride SA (KLOR-CON M) CR tablet 40 mEq  40 mEq Oral Q3H Emokpae, Courage, MD   40 mEq at 07/12/21 0826   umeclidinium bromide (INCRUSE ELLIPTA) 62.5 MCG/ACT 1 puff  1 puff Inhalation Daily Roxan Hockey, MD   1 puff at 07/12/21 0734    Allergies as of 07/10/2021 - Review Complete 07/10/2021  Allergen Reaction Noted   Codeine Itching 09/05/2014    Family History  Problem Relation  Age of Onset   Congestive Heart Failure Mother 43   Heart attack Father 41   Congestive Heart Failure Sister     Social History   Socioeconomic History   Marital status: Married    Spouse name: Not on file   Number of children: Not on file   Years of education: Not on file   Highest education level: Not on file  Occupational History   Not on file  Tobacco Use   Smoking status: Every Day    Packs/day: 1.00    Years: 40.00    Pack years: 40.00    Types: Cigarettes   Smokeless tobacco: Never   Tobacco comments:    down to 5 cigarettes/day from 1.5 PPD  Vaping Use   Vaping Use: Never used  Substance and Sexual Activity   Alcohol use: Yes    Alcohol/week: 12.0 standard drinks    Types: 12 Standard drinks or equivalent per week   Drug use: No   Sexual activity: Not on file  Other Topics Concern   Not on file  Social History Narrative   Not on file   Social Determinants of Health   Financial Resource Strain: Not on file  Food Insecurity: Not on file  Transportation Needs: Not on file  Physical Activity: Not on file  Stress: Not on file  Social Connections: Not on file  Intimate Partner Violence: Not on file    Review of Systems: Gen: see HPI CV: Denies chest pain, heart  palpitations, syncope, edema  Resp: Denies shortness of breath with rest, cough, wheezing GI: see HPI GU : see HPI MS: see HPI Derm: Denies rash, itching, dry skin Psych: Denies depression, anxiety,confusion, or memory loss Heme: Denies bruising, bleeding, and enlarged lymph nodes.  Physical Exam: Vital signs in last 24 hours: Temp:  [97.7 F (36.5 C)-99.2 F (37.3 C)] 98.1 F (36.7 C) (01/16 0439) Pulse Rate:  [93-99] 99 (01/16 0815) Resp:  [16-20] 16 (01/16 0815) BP: (63-119)/(37-90) 119/59 (01/16 0815) SpO2:  [90 %-98 %] 96 % (01/16 0815) Last BM Date: 07/05/21 (per patient) General:   Alert,  frail, chronically ill-appearing Head:  Normocephalic and atraumatic. Eyes:  Sclera clear, no icterus.    Ears:  Normal auditory acuity. Nose:  No deformity, discharge,  or lesions. Mouth:  poor dentition Lungs:  Clear throughout to auscultation.    Heart:  S1 S2 present without murmurs Abdomen:  Soft, nontender and nondistended. No masses, hepatosplenomegaly or hernias noted. Normal bowel sounds, without guarding, and without rebound.   Rectal:  external hemorrhoids. Fecal seepage on exam. DRE without obvious mass. Stool distal rectum unable to be removed digitally.  Extremities:  Without edema. Neurologic:  Alert and  oriented x4 Psych:  Alert and cooperative. Normal mood and affect.  Intake/Output from previous day: 01/15 0701 - 01/16 0700 In: 2482.7 [P.O.:1464; I.V.:285.2; Blood:649.5; IV Piggyback:83.9] Out: 5000 [Urine:5000] Intake/Output this shift: Total I/O In: 480 [P.O.:480] Out: -   Lab Results: Recent Labs    07/10/21 2048 07/11/21 0550 07/12/21 0523  WBC 22.8* 18.4* 14.7*  HGB 8.3* 6.7* 10.3*  HCT 26.6* 22.3* 32.3*  PLT 363 322 286   BMET Recent Labs    07/10/21 2048 07/11/21 0550 07/12/21 0523  NA 125* 132* 139  K 2.2* 2.3* 2.4*  CL 96* 106 113*  CO2 17* 16* 17*  GLUCOSE 95 102* 111*  BUN 39* 33* 31*  CREATININE 3.08* 2.60* 2.33*  CALCIUM 8.2*  7.5* 7.8*   LFT  Recent Labs    07/10/21 2048 07/11/21 0550 07/12/21 0523  PROT 6.4* 5.5* 5.9*  ALBUMIN 2.4* 2.0* 2.0*  AST 166* 160* 177*  ALT 161* 135* 138*  ALKPHOS 83 71 71  BILITOT 0.6 0.4 0.6   PT/INR Recent Labs    07/10/21 2048  LABPROT 14.5  INR 1.1     Studies/Results: CT ABDOMEN PELVIS WO CONTRAST  Result Date: 07/10/2021 CLINICAL DATA:  [place], generalized weakness EXAM: CT ABDOMEN AND PELVIS WITHOUT CONTRAST TECHNIQUE: Multidetector CT imaging of the abdomen and pelvis was performed following the standard protocol without IV contrast. Unenhanced CT was performed per clinician order. Lack of IV contrast limits sensitivity and specificity, especially for evaluation of abdominal/pelvic solid viscera. RADIATION DOSE REDUCTION: This exam was performed according to the departmental dose-optimization program which includes automated exposure control, adjustment of the mA and/or kV according to patient size and/or use of iterative reconstruction technique. COMPARISON:  None. FINDINGS: Lower chest: Bibasilar scarring and fibrosis. No acute pleural or parenchymal lung disease. Hepatobiliary: Unremarkable unenhanced appearance of the liver and gallbladder. Pancreas: Unremarkable unenhanced appearance. Spleen: Unremarkable unenhanced appearance. Adrenals/Urinary Tract: Low-attenuation nodular thickening left adrenal gland consistent with hyperplasia or adenoma. Right adrenals unremarkable. No urinary tract calculi or obstructive uropathy within either kidney. Vascular calcifications are seen at the renal hila. The bladder is unremarkable. Stomach/Bowel: No bowel obstruction or ileus. Large amount of retained stool within the rectal vault may reflect fecal impaction. Normal appendix right lower quadrant. No bowel wall thickening or inflammatory change. Vascular/Lymphatic: Diffuse aortic atherosclerosis. Evaluation of the vascular lumen limited by the lack of IV contrast. No pathologic  adenopathy. Reproductive: Prostate is not enlarged. Testicles are retracted into the distal inguinal canals bilaterally. Other: No free intraperitoneal fluid or free gas. No abdominal wall hernia. Musculoskeletal: Age-indeterminate fracture of the right fourth costochondral junction, only partially visualized on this study. Chronic right posterolateral eleventh rib fracture with moderate callus formation. No other acute bony abnormalities. Subcutaneous fat stranding in the right gluteal region may reflect direct trauma and ecchymosis. No evidence of hematoma. Reconstructed images demonstrate no additional findings. IMPRESSION: 1. Subcutaneous fat stranding right gluteal region which may reflect direct trauma and ecchymosis. 2. Age indeterminate fracture of the right fourth costochondral junction, only partially visualized on this study. Chronic right posterolateral eleventh rib fracture. 3. Otherwise no acute intra-abdominal crash that marked retained stool within the distal colon, compatible with fecal impaction. No bowel obstruction or ileus. 4.  Aortic Atherosclerosis (ICD10-I70.0). Electronically Signed   By: Randa Ngo M.D.   On: 07/10/2021 22:37   CT Head Wo Contrast  Result Date: 07/10/2021 CLINICAL DATA:  Recent fall with weakness, initial encounter EXAM: CT HEAD WITHOUT CONTRAST TECHNIQUE: Contiguous axial images were obtained from the base of the skull through the vertex without intravenous contrast. RADIATION DOSE REDUCTION: This exam was performed according to the departmental dose-optimization program which includes automated exposure control, adjustment of the mA and/or kV according to patient size and/or use of iterative reconstruction technique. COMPARISON:  08/22/2016 FINDINGS: Brain: No evidence of acute infarction, hemorrhage, hydrocephalus, extra-axial collection or mass lesion/mass effect. 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  Scattered lacunar infarcts in the basal ganglia bilaterally are again noted. Vascular: No hyperdense vessel or unexpected calcification. Skull: Normal. Negative for fracture or focal lesion. Sinuses/Orbits: No acute finding. Other: None. IMPRESSION: Chronic  ischemic changes without acute abnormality. Electronically Signed   By: Linus Mako.D.  On: 07/10/2021 21:10   DG Chest Port 1 View  Result Date: 07/10/2021 CLINICAL DATA:  cough, fall EXAM: PORTABLE CHEST 1 VIEW COMPARISON:  Chest x-ray 08/22/2016 FINDINGS: The heart and mediastinal contours are unchanged. Aortic calcification No focal consolidation. No pulmonary edema. No pleural effusion. No pneumothorax. No acute osseous abnormality. IMPRESSION: No active disease. Electronically Signed   By: Iven Finn M.D.   On: 07/10/2021 21:11    Impression: 66  year old male with history of CAD, PAD, prior stroke in the past, CKD, presenting via EMS after being found in debilitating conditions without oral intake for several days and multiple falls. He was admitted with streptococcal sepsis, rhabdomyolysis, AKI on CKD, and multiple metabolic derangements in setting of dehydration and failure to thrive. GI consulted due to anemia.   Microcytic anemia:   On Plavix and aspirin as outpatient. Unclear last dose of outpatient Plavix, but this is on hold during hospitalization. Found to have Hgb 8.3, with last record in chart Hgb in the 11/12 range. Hgb down to 6.7 yesterday morning. Ferritin 124, iron low at 11. Unknown hemoccult status. Received 2 units PRBCs with improvement to 10.3 this morning. I personally performed rectal exam with brown stool present. Last colonoscopy/EGD in distant past in [place], [place]. No overt GI bleeding per patient. Consider colonoscopy/EGD as outpatient vs inpatient prior to discharge, depending on clinical course. Multiple other medical issues this hospitalization.   Elevated transaminases: likely due to rhabdomyolysis. Will  check hepatitis serologies. INR daily.   Fecal impaction:  Reports diarrhea prior to admission but suspect this was an overflow incontinence. Unable to disimpact as too distal. Will start on Miralax BID and enema today.   Plan: Follow H/H Recommend colonoscopy/EGD when clinically stable: consider outpatient vs inpatient. Timing to be determined. Plavix on hold PPI daily Check viral hepatitis serologies INR now and daily Miralax BID, enema X 1 now Will continue to follow with you   Annitta Needs, PhD, ANP-BC [facility]     LOS: 2 days    07/12/2021, 10:51 AM

## 2021-07-12 NOTE — Evaluation (Signed)
Physical Therapy Evaluation Patient Details Name: George Valdez MRN: [id number] DOB: 1956/06/08 Today's Date: 07/12/2021  History of Present Illness  George Valdez is a 66 y.o. male with medical history significant for hypertension, CAD/PAD, prior stroke with subsequent right-sided weakness, hyperlipidemia, chronic disease stage III who presents to the emergency department via EMS due to fall and generalized weakness.  Patient complained of 3-day onset of increased weakness, decreased food and fluid intake.  Patient patient states that he lives at home with wife who also have similar conditions and was taken to the ED for further evaluation and management.   Clinical Impression  Patient demonstrates slow labored movement for sitting up at bedside requiring Mod assist due to BUE weakness, tolerated standing for up to 5 minutes while being cleaned due to bowel movement, very unsteady on feet and limited to a few steps leaning on armrest of chair during transfer.  Patient tolerated sitting up in chair after therapy.  Patient will benefit from continued skilled physical therapy in hospital and recommended venue below to increase strength, balance, endurance for safe ADLs and gait.         Recommendations for follow up therapy are one component of a multi-disciplinary discharge planning process, led by the attending physician.  Recommendations may be updated based on patient status, additional functional criteria and insurance authorization.  Follow Up Recommendations Skilled nursing-short term rehab (<3 hours/day)    Assistance Recommended at Discharge Intermittent Supervision/Assistance  Patient can return home with the following  A lot of help with walking and/or transfers;A lot of help with bathing/dressing/bathroom;Help with stairs or ramp for entrance    Equipment Recommendations None recommended by PT  Recommendations for Other Services       Functional Status Assessment  Patient has had a recent decline in their functional status and demonstrates the ability to make significant improvements in function in a reasonable and predictable amount of time.     Precautions / Restrictions Precautions Precautions: Fall Restrictions Weight Bearing Restrictions: No      Mobility  Bed Mobility Overal bed mobility: Needs Assistance Bed Mobility: Supine to Sit     Supine to sit: Mod assist     General bed mobility comments: as per OT notes    Transfers Overall transfer level: Needs assistance Equipment used: None Transfers: Sit to/from Stand;Bed to chair/wheelchair/BSC Sit to Stand: Min assist (used walker for assist when standing during toilet hygiene.)     Squat pivot transfers: Min assist     General transfer comment: as per OT notes    Ambulation/Gait   Gait Distance (Feet): 3 Feet Assistive device: None Gait Pattern/deviations: Decreased step length - left;Decreased stance time - right;Decreased stride length Gait velocity: slow     General Gait Details: limited to a 2-3 slow labored unsteady steps leaning on arm rest of chair  Stairs            Wheelchair Mobility    Modified Rankin (Stroke Patients Only)       Balance Overall balance assessment: Needs assistance Sitting-balance support: Feet supported;No upper extremity supported Sitting balance-Leahy Scale: Fair Sitting balance - Comments: seated at EOB Postural control: Posterior lean Standing balance support: Reliant on assistive device for balance;During functional activity;Bilateral upper extremity supported Standing balance-Leahy Scale: Poor Standing balance comment: fair/poor leaning on RW or armrest of chair  Pertinent Vitals/Pain Pain Assessment: No/denies pain    Home Living Family/patient expects to be discharged to:: Private residence Living Arrangements: Spouse/significant other Available Help at Discharge:  Family;Available PRN/intermittently Type of Home: House Home Access: Ramped entrance       Home Layout: One level Home Equipment: Grab bars - tub/shower;Grab bars - toilet;Rolling Walker (2 wheels);Wheelchair - power;BSC/3in1;Shower seat - built in      Prior Function Prior Level of Function : Independent/Modified Independent;History of Falls (last six months)             Mobility Comments: Patient reports that he has not been able to ambulate in the past few months. He has had his powered wheelchair for 2 years and uses that for all mobility needs. ADLs Comments: Patient reports he completes all bathing, dressing, toileting, and meal prep himself. His wife drives. They both complete grocery shopping together.     Hand Dominance   Dominant Hand: Right    Extremity/Trunk Assessment   Upper Extremity Assessment Upper Extremity Assessment: Defer to OT evaluation    Lower Extremity Assessment Lower Extremity Assessment: Generalized weakness    Cervical / Trunk Assessment Cervical / Trunk Assessment: Normal  Communication   Communication: No difficulties  Cognition Arousal/Alertness: Awake/alert Behavior During Therapy: WFL for tasks assessed/performed Overall Cognitive Status: Within Functional Limits for tasks assessed                                          General Comments      Exercises     Assessment/Plan    PT Assessment Patient needs continued PT services  PT Problem List Decreased strength;Decreased activity tolerance;Decreased balance;Decreased mobility       PT Treatment Interventions DME instruction;Gait training;Functional mobility training;Therapeutic activities;Therapeutic exercise;Balance training;Patient/family education;Wheelchair mobility training    PT Goals (Current goals can be found in the Care Plan section)  Acute Rehab PT Goals Patient Stated Goal: return home after rehab PT Goal Formulation: With patient Time For  Goal Achievement: 07/26/21 Potential to Achieve Goals: Good    Frequency Min 3X/week     Co-evaluation PT/OT/SLP Co-Evaluation/Treatment: Yes Reason for Co-Treatment: Complexity of the patient's impairments (multi-system involvement);To address functional/ADL transfers PT goals addressed during session: Mobility/safety with mobility;Balance;Proper use of DME OT goals addressed during session: Strengthening/ROM;ADL's and self-care       AM-PAC PT "6 Clicks" Mobility  Outcome Measure Help needed turning from your back to your side while in a flat bed without using bedrails?: A Little Help needed moving from lying on your back to sitting on the side of a flat bed without using bedrails?: A Lot Help needed moving to and from a bed to a chair (including a wheelchair)?: A Lot Help needed standing up from a chair using your arms (e.g., wheelchair or bedside chair)?: A Lot Help needed to walk in hospital room?: A Lot Help needed climbing 3-5 steps with a railing? : Total 6 Click Score: 12    End of Session   Activity Tolerance: Patient tolerated treatment well Patient left: in chair;with call bell/phone within reach;with chair alarm set Nurse Communication: Mobility status PT Visit Diagnosis: Unsteadiness on feet (R26.81);Other abnormalities of gait and mobility (R26.89);Muscle weakness (generalized) (M62.81)    Time: 6073-7106 PT Time Calculation (min) (ACUTE ONLY): 29 min   Charges:   PT Evaluation $PT Eval Moderate Complexity: 1 Mod PT Treatments $Therapeutic Activity:  23-37 mins        1:51 PM, 07/12/21 Lonell Grandchild, MPT Physical Therapist with [facility] [phone] office [phone] mobile phone

## 2021-07-12 NOTE — Evaluation (Signed)
Occupational Therapy Evaluation Patient Details Name: George Valdez MRN: [id number] DOB: 08/27/1955 Today's Date: 07/12/2021   History of Present Illness SHINICHI ANGUIANO is a 66 y.o. male with medical history significant for hypertension, CAD/PAD, prior stroke with subsequent right-sided weakness, hyperlipidemia, chronic disease stage III who presents to the emergency department via EMS due to fall and generalized weakness.  Patient complained of 3-day onset of increased weakness, decreased food and fluid intake.  Patient patient states that he lives at home with wife who also have similar conditions and was taken to the ED for further evaluation and management.   Clinical Impression   Pt in bed upon therapy arrival and agreeable to participate in evaluation with OT and PT. Patient is currently presenting with generalized weakness which has been causing him to fall frequently at home. He requires increased assistance to complete basic ADL tasks. At this time, patient will require [place] OT at discharge. Will continue to monitor patient's progress while here and change recommendation if needed. OT will follow patient acutely.       Recommendations for follow up therapy are one component of a multi-disciplinary discharge planning process, led by the attending physician.  Recommendations may be updated based on patient status, additional functional criteria and insurance authorization.   Follow Up Recommendations  Home health OT    Assistance Recommended at Discharge Intermittent Supervision/Assistance  Patient can return home with the following A little help with walking and/or transfers;A little help with bathing/dressing/bathroom    Functional Status Assessment  Patient has had a recent decline in their functional status and demonstrates the ability to make significant improvements in function in a reasonable and predictable amount of time.  Equipment Recommendations  None  recommended by OT    Recommendations for Other Services       Precautions / Restrictions Precautions Precautions: Fall Restrictions Weight Bearing Restrictions: No      Mobility Bed Mobility Overal bed mobility: Needs Assistance Bed Mobility: Supine to Sit     Supine to sit: Mod assist          Transfers Overall transfer level: Needs assistance Equipment used: None Transfers: Sit to/from Stand;Bed to chair/wheelchair/BSC Sit to Stand: Min assist (used walker for assist when standing during toilet hygiene.)   Squat pivot transfers: Min assist              Balance Overall balance assessment: Needs assistance   Sitting balance-Leahy Scale: Fair Sitting balance - Comments: when attempting to donn socks seated on edge of bed. Postural control: Posterior lean Standing balance support: Bilateral upper extremity supported;During functional activity;Reliant on assistive device for balance Standing balance-Leahy Scale: Poor           ADL either performed or assessed with clinical judgement   ADL Overall ADL's : Needs assistance/impaired Eating/Feeding: Set up;Bed level   Grooming: Wash/dry face;Wash/dry hands;Set up;Sitting   Upper Body Bathing: Set up;Sitting   Lower Body Bathing: Total assistance;Sit to/from stand;Sitting/lateral leans   Upper Body Dressing : Set up;Sitting   Lower Body Dressing: Total assistance;Sit to/from stand;Sitting/lateral leans   Toilet Transfer: Minimal assistance;Stand-pivot Toilet Transfer Details (indicate cue type and reason): from bed to recliner Toileting- Clothing Manipulation and Hygiene: Total assistance;Sit to/from stand Toileting - Clothing Manipulation Details (indicate cue type and reason): PT provided toilet hygiene while patient stood at bedside.     Functional mobility during ADLs: Minimal assistance       Vision Baseline Vision/History: 1 Wears glasses Ability to See  in Adequate Light: 0 Adequate Patient  Visual Report: No change from baseline Vision Assessment?: No apparent visual deficits            Pertinent Vitals/Pain Pain Assessment: No/denies pain     Hand Dominance Right   Extremity/Trunk Assessment Upper Extremity Assessment Upper Extremity Assessment: Generalized weakness   Lower Extremity Assessment Lower Extremity Assessment: Defer to PT evaluation       Communication Communication Communication: No difficulties   Cognition Arousal/Alertness: Awake/alert Behavior During Therapy: WFL for tasks assessed/performed Overall Cognitive Status: Within Functional Limits for tasks assessed                      Home Living Family/patient expects to be discharged to:: Private residence Living Arrangements: Spouse/significant other Available Help at Discharge:  (No one is available.) Type of Home: House Home Access: Ramped entrance     Home Layout: One level     Bathroom Shower/Tub: Teacher, early years/pre: Standard     Home Equipment: Grab bars - tub/shower;Grab bars - toilet;Rolling Walker (2 wheels);Wheelchair - power;BSC/3in1;Shower seat - built in          Prior Functioning/Environment Prior Level of Function : Independent/Modified Independent;History of Falls (last six months)             Mobility Comments: Patient reports that he has not been able to ambulate in the past few months. He has had his powered wheelchair for 2 years and uses that for all mobility needs. ADLs Comments: Patient reports he completes all bathing, dressing, toileting, and meal prep himself. His wife drives. They both complete grocery shopping together.        OT Problem List: Impaired balance (sitting and/or standing);Decreased activity tolerance;Decreased strength      OT Treatment/Interventions: Self-care/ADL training;Therapeutic exercise;Therapeutic activities;DME and/or AE instruction;Patient/family education;Balance training    OT Goals(Current  goals can be found in the care plan section) Acute Rehab OT Goals Patient Stated Goal: to go home OT Goal Formulation: With patient Time For Goal Achievement: 07/26/21 Potential to Achieve Goals: Good  OT Frequency: Min 3X/week    Co-evaluation PT/OT/SLP Co-Evaluation/Treatment: Yes Reason for Co-Treatment: To address functional/ADL transfers   OT goals addressed during session: Strengthening/ROM;ADL's and self-care      AM-PAC OT "6 Clicks" Daily Activity     Outcome Measure Help from another person eating meals?: None Help from another person taking care of personal grooming?: A Little Help from another person toileting, which includes using toliet, bedpan, or urinal?: A Lot Help from another person bathing (including washing, rinsing, drying)?: A Lot Help from another person to put on and taking off regular upper body clothing?: None Help from another person to put on and taking off regular lower body clothing?: A Lot 6 Click Score: 17   End of Session Equipment Utilized During Treatment: Rolling walker (2 wheels)  Activity Tolerance: Patient tolerated treatment well Patient left: in chair;with call bell/phone within reach;with chair alarm set  OT Visit Diagnosis: Repeated falls (R29.6);Muscle weakness (generalized) (M62.81)                Time: 8676-7209 OT Time Calculation (min): 21 min Charges:  OT General Charges $OT Visit: 1 Visit OT Evaluation $OT Eval Low Complexity: [place], OTR/L,CBIS  [phone]   Haiden Rawlinson, Clarene Duke 07/12/2021, 10:14 AM

## 2021-07-12 NOTE — Progress Notes (Signed)
°   07/12/21 2343  Assess: MEWS Score  Temp 98.2 F (36.8 C)  BP 100/60  Pulse Rate 94  Resp 19  SpO2 99 %  Assess: MEWS Score  MEWS Temp 0  MEWS Systolic 1  MEWS Pulse 0  MEWS RR 0  MEWS LOC 0  MEWS Score 1  MEWS Score Color Green  Assess: if the MEWS score is Yellow or Red  Were vital signs taken at a resting state? Yes  Focused Assessment No change from prior assessment  Early Detection of Sepsis Score *See Row Information* High  MEWS guidelines implemented *See Row Information* Yes

## 2021-07-12 NOTE — Progress Notes (Signed)
*  PRELIMINARY RESULTS* Echocardiogram 2D Echocardiogram has been performed.  George Valdez 07/12/2021, 10:47 AM

## 2021-07-12 NOTE — TOC Initial Note (Signed)
Transition of Care [facility]) - Initial/Assessment Note    Patient Details  Name: George Valdez MRN: [id number] Date of Birth: 1955-08-20  Transition of [facility]) CM/SW Contact:    Shade Flood, LCSW Phone Number: 07/12/2021, 2:22 PM  Clinical Narrative:                  Pt admitted from home. Pt lives with his wife at home and he uses a power wheelchair for mobility. Pt does transfer independently. PT recommending SNF rehab. Spoke with pt who is agreeable. CMS provider options reviewed and will refer as requested.   MD anticipating dc in 1-2 days. [place] [employer] has an Special educational needs teacher in place until the end of the month.  TOC will follow.  Expected Discharge Plan: Skilled Nursing Facility Barriers to Discharge: Continued Medical Work up   Patient Goals and CMS Choice Patient states their goals for this hospitalization and ongoing recovery are:: get better CMS Medicare.gov Compare Post Acute Care list provided to:: Patient    Expected Discharge Plan and Services Expected Discharge Plan: [place] In-house Referral: Clinical Social Work   Post Acute Care Choice: [place] Living arrangements for the past 2 months: Apartment                                      Prior Living Arrangements/Services Living arrangements for the past 2 months: Apartment Lives with:: Spouse Patient language and need for interpreter reviewed:: Yes Do you feel safe going back to the place where you live?: Yes      Need for Family Participation in Patient Care: Yes (Comment) Care giver support system in place?: Yes (comment) Current home services: DME Criminal Activity/Legal Involvement Pertinent to Current Situation/Hospitalization: No - Comment as needed  Activities of Daily Living Home Assistive Devices/Equipment: Eyeglasses, Shower chair with back, Other (Comment) (power wheelchair) ADL Screening (condition at time of admission) Patient's  cognitive ability adequate to safely complete daily activities?: Yes Is the patient deaf or have difficulty hearing?: Yes Does the patient have difficulty seeing, even when wearing glasses/contacts?: No Does the patient have difficulty concentrating, remembering, or making decisions?: Yes Patient able to express need for assistance with ADLs?: Yes Does the patient have difficulty dressing or bathing?: Yes Independently performs ADLs?: Yes (appropriate for developmental age) (independent at home, frequent falls/ weakness) Does the patient have difficulty walking or climbing stairs?: Yes Weakness of Legs: Both Weakness of Arms/Hands: None  Permission Sought/Granted Permission sought to share information with : Chartered certified accountant granted to share information with : Yes, Verbal Permission Granted     Permission granted to share info w AGENCY: SNF        Emotional Assessment   Attitude/Demeanor/Rapport: Engaged Affect (typically observed): Pleasant Orientation: : Oriented to Self, Oriented to Place, Oriented to  Time, Oriented to Situation Alcohol / Substance Use: Not Applicable Psych Involvement: No (comment)  Admission diagnosis:  Rhabdomyolysis [M62.82] Hypokalemia [E87.6] Hyponatremia [E87.1] Severe sepsis ([place]) [A41.9, R65.20] Traumatic rhabdomyolysis, initial encounter ([place]) [T79.6XXA] Acute renal failure, unspecified acute renal failure type ([place]) [N17.9] Anemia, unspecified type [D64.9] Patient Active Problem List   Diagnosis Date Noted   Rhabdomyolysis 07/10/2021   Generalized weakness 07/10/2021   Failure to thrive in adult 07/10/2021   Physical deconditioning 07/10/2021   Transaminitis 07/10/2021   Hypomagnesemia 07/10/2021   Leukocytosis 07/10/2021   Dehydration 07/10/2021   Mixed hyperlipidemia  07/10/2021   History of stroke 07/10/2021   Altered mental status 08/22/2016   Aspiration pneumonia ([place]) 08/22/2016   UTI (urinary tract  infection) 08/22/2016   Protein-calorie malnutrition, severe 07/30/2016   Falls 07/22/2016   Weakness of left side of body 07/22/2016   h/o CVA (cerebral vascular accident) ([place]) with left sided weakness 07/22/2016   Alcohol abuse 07/22/2016   Hypotension 07/22/2016   Frequent falls    Microcytic anemia 11/30/2015   Hypokalemia 11/30/2015   AKI (acute kidney injury) ([place]) 11/30/2015   Acute encephalopathy 09/10/2014   Alcohol withdrawal delirium ([place]) 09/10/2014   Acute kidney injury superimposed on CKD ([place]) 09/10/2014   Sinus bradycardia 09/10/2014   Essential hypertension 09/10/2014   CAD (coronary artery disease) 09/10/2014   PAD (peripheral artery disease) ([place]) 09/10/2014   Pernicious anemia 09/10/2014   PCP:  Patient, No Pcp Per (Inactive) Pharmacy:   [place], [facility] [address] [place] [address] Phone: [phone] Fax: [phone]     Social Determinants of Health (SDOH) Interventions    Readmission Risk Interventions No flowsheet data found.

## 2021-07-13 DIAGNOSIS — B954 Other streptococcus as the cause of diseases classified elsewhere: Secondary | ICD-10-CM | POA: Diagnosis present

## 2021-07-13 LAB — CBC
HCT: 28.6 % — ABNORMAL LOW (ref 39.0–52.0)
Hemoglobin: 9.1 g/dL — ABNORMAL LOW (ref 13.0–17.0)
MCH: 25.8 pg — ABNORMAL LOW (ref 26.0–34.0)
MCHC: 31.8 g/dL (ref 30.0–36.0)
MCV: 81 fL (ref 80.0–100.0)
Platelets: 281 10*3/uL (ref 150–400)
RBC: 3.53 MIL/uL — ABNORMAL LOW (ref 4.22–5.81)
RDW: 17.7 % — ABNORMAL HIGH (ref 11.5–15.5)
WBC: 11.4 10*3/uL — ABNORMAL HIGH (ref 4.0–10.5)
nRBC: 0 % (ref 0.0–0.2)

## 2021-07-13 LAB — COMPREHENSIVE METABOLIC PANEL
ALT: 151 U/L — ABNORMAL HIGH (ref 0–44)
AST: 194 U/L — ABNORMAL HIGH (ref 15–41)
Albumin: 1.7 g/dL — ABNORMAL LOW (ref 3.5–5.0)
Alkaline Phosphatase: 69 U/L (ref 38–126)
Anion gap: 7 (ref 5–15)
BUN: 21 mg/dL (ref 8–23)
CO2: 17 mmol/L — ABNORMAL LOW (ref 22–32)
Calcium: 7.6 mg/dL — ABNORMAL LOW (ref 8.9–10.3)
Chloride: 115 mmol/L — ABNORMAL HIGH (ref 98–111)
Creatinine, Ser: 1.88 mg/dL — ABNORMAL HIGH (ref 0.61–1.24)
GFR, Estimated: 39 mL/min — ABNORMAL LOW (ref >60)
Glucose, Bld: 100 mg/dL — ABNORMAL HIGH (ref 70–99)
Potassium: 3.3 mmol/L — ABNORMAL LOW (ref 3.5–5.1)
Sodium: 139 mmol/L (ref 135–145)
Total Bilirubin: 0.8 mg/dL (ref 0.3–1.2)
Total Protein: 5.2 g/dL — ABNORMAL LOW (ref 6.5–8.1)

## 2021-07-13 LAB — URINE CULTURE: Culture: >=100000 — AB

## 2021-07-13 LAB — CULTURE, BLOOD (ROUTINE X 2): Special Requests: ADEQUATE

## 2021-07-13 LAB — PROTIME-INR
INR: 1.2 (ref 0.8–1.2)
Prothrombin Time: 15.1 seconds (ref 11.4–15.2)

## 2021-07-13 LAB — CORTISOL-AM, BLOOD: Cortisol - AM: 14.5 ug/dL (ref 6.7–22.6)

## 2021-07-13 MED ORDER — ENSURE ENLIVE PO LIQD
237.0000 mL | Freq: Four times a day (QID) | ORAL | Status: DC
  Administered 2021-07-14 – 2021-07-16 (×6): 237 mL via ORAL

## 2021-07-13 MED ORDER — POTASSIUM CHLORIDE CRYS ER 20 MEQ PO TBCR
40.0000 meq | EXTENDED_RELEASE_TABLET | ORAL | Status: AC
  Administered 2021-07-13 (×2): 40 meq via ORAL
  Filled 2021-07-13 (×2): qty 2

## 2021-07-13 MED ORDER — CEPHALEXIN 500 MG PO CAPS
500.0000 mg | ORAL_CAPSULE | Freq: Three times a day (TID) | ORAL | Status: DC
Start: 2021-07-14 — End: 2021-07-16
  Administered 2021-07-14 – 2021-07-16 (×7): 500 mg via ORAL
  Filled 2021-07-13 (×7): qty 1

## 2021-07-13 NOTE — Progress Notes (Addendum)
PROGRESS NOTE   George Valdez, is a 66 y.o. male, DOB - June 21, 1956, KZS:[id number]  Admit date - 07/10/2021   Admitting Physician Bernadette Hoit, DO  Outpatient Primary MD for the patient is Patient, No Pcp Per (Inactive)  LOS - 3  Chief Complaint  Patient presents with   Fall    Generalized weakness        Brief Narrative:  George Valdez is a 66 y.o. male with medical history significant for hypertension, CAD/PAD, prior stroke with subsequent right-sided weakness, hyperlipidemia, chronic disease stage IIIB admitted on 07/10/2021 with recurrent falls and rhabdomyolysis as well as strep G sepsis  Assessment & Plan:   Principal Problem:   Bacterial infection/Bacteremia due to Streptococcus, group G Active Problems:   Acute kidney injury superimposed on CKD (HCC)   PAD (peripheral artery disease) (HCC)   Rhabdomyolysis   Transaminitis   E. coli UTI (urinary tract infection)   Essential hypertension   CAD (coronary artery disease)   Anemia   Hypokalemia   AKI (acute kidney injury) ([place])   h/o CVA (cerebral vascular accident) ([place]) with left sided weakness   Frequent falls   Generalized weakness   Failure to thrive in adult   Physical deconditioning   Hypomagnesemia   Leukocytosis   Dehydration   Mixed hyperlipidemia   History of stroke   1)Streptococcal G Bacteremia Sepsis---- --WBC 22.8 >>18.4 >>14.7>>11.4 PCT 1.12 07/12/21---TTE---shows Thickening of distal anterior MV leaflet likely represents  calcification, cannot exclude small vegetation. Consider TEE if clinical  suspicion of endocarditis. . -ID consult requested, ??? Need for TEE -ID Physician Dr Gale Journey rec--Please repeat blood culture,. If positive, or if there are evidence of embolic phenomenom. Or if the initial cultures all bottles grow would consider TEE (currently only 1 of 2 initial blood cultures for strep G)--[place] physician strep G bacteremia is probably pathogenic and Not a contaminant -Okay to  transition from Rocephin to Keflex on 07/14/2020  2)Rhabdomyolysis in the setting of frequent falls--- CPK trending down -Hydrating  3)E coli UTI---Okay to transition from Rocephin to Keflex on 07/14/2020  4)Recurrent falls--- AM cortisol is not low, doubt orthostatic drops -Patient with old/residual weakness due to prior strokes  CT abdomen and pelvis without acute intra-abdominal finding -Benzodiazepine may be contributing to recurrent falls PTA pt lived at home and did very poorly, patient has significant limitations with mobility related ADLs- this patient needs to continue to be monitored in the hospital until a SNF bed is obtained as she is not safe to go home with her current physcical limitations -PT eval appreciated recommends SNF rehab -  5)AKI----acute kidney injury on CKD stage -3B -Due to dehydration in the setting of recurrent falls and poor oral intake and rhabdomyolysis -Creatinine is down to 1.88 from 3.08 on admission, no recent baseline available -- Continue to hydrate  renally adjust medications, avoid nephrotoxic agents / dehydration  / hypotension  6)CAD/PAD/history of strokes-----Patient with old/residual weakness due to prior strokes Continue aspirin,  Hold Plavix as patient may need EGD and colonoscopy  ---- continue to hold Crestor due to rhabdomyolysis and transaminitis  6)COPD--continue bronchodilators  7)Transaminitis--- most likely due to rhabdomyolysis, monitor closely -Acute viral hepatitis panel negative for hep A, hep B, and hep C INR 1.2 -Hold Crestor  8) hypomagnesemia/hyponatremia/hypokalemia--- dehydration and poor oral intake, hydrate and replace electrolytes  9) acute on chronic iron deficiency anemia-- Hgb was down to 6.7  -Hgb > 9 post transfusion of 2 units of PRBC -Would benefit from EGD  and colonoscopy -GI consult appreciated -Protonix for now -Stool occult blood requested -No overt bleeding noted--- however PTA patient was on  aspirin and Plavix  Disposition/Need for in-Hospital Stay- patient unable to be discharged at this time due to --- rhabdomyolysis and AKI requiring IV fluids, streptococcal sepsis and E. coli UTI requiring IV antibiotics -Possible discharge to SNF-- if Repeat Blood cx are Neg on 07/15/21  Status is: Inpatient  Remains inpatient appropriate because:   Disposition: The patient is from: Home              Anticipated d/c is to: SNF              Anticipated d/c date is: 2 days (if Repeat Blood cx are Neg on 07/15/21)              Patient currently is not medically stable to d/c. Barriers: Not Clinically Stable-   Code Status :  -  Code Status: DNR   Family Communication:   (patient is alert, awake and coherent)  Discussed with his wife  Consults  :  Gi/Palliative/ID  DVT Prophylaxis  :   - SCDs/?? GI bleed  Lab Results  Component Value Date   PLT 281 07/13/2021    Inpatient Medications  Scheduled Meds:  aspirin EC  81 mg Oral Daily   [START ON 07/14/2021] cephALEXin  500 mg Oral TID   feeding supplement  237 mL Oral TID BM   multivitamin with minerals  1 tablet Oral Daily   pantoprazole (PROTONIX) IV  40 mg Intravenous Q24H   polyethylene glycol  17 g Oral BID   umeclidinium bromide  1 puff Inhalation Daily   Continuous Infusions:  cefTRIAXone (ROCEPHIN)  IV 2 g (07/12/21 2024)   dextrose 5 % and 0.9 % NaCl with KCl 20 mEq/L 125 mL/hr at 07/13/21 1022   PRN Meds:.albuterol, ALPRAZolam   Anti-infectives (From admission, onward)    Start     Dose/Rate Route Frequency Ordered Stop   07/14/21 1000  cephALEXin (KEFLEX) capsule 500 mg        500 mg Oral 3 times daily 07/13/21 0920 07/17/21 0959   07/10/21 2100  cefTRIAXone (ROCEPHIN) 2 g in sodium chloride 0.9 % 100 mL IVPB        2 g 200 mL/hr over 30 Minutes Intravenous Every 24 hours 07/10/21 2045 07/13/21 2359         Subjective: Harrell Gave Ambrosius today has no fevers, no emesis,  No chest pain,   -- Oral intake  is fair -Less confused -Weakness and malaise persist  Objective: Vitals:   07/12/21 2023 07/12/21 2343 07/13/21 0615 07/13/21 0708  BP: (!) 82/66 100/60 118/67   Pulse: 98 94 93   Resp: (!) 23 19 (!) 21   Temp: 98.1 F (36.7 C) 98.2 F (36.8 C) 97.6 F (36.4 C)   TempSrc:   Oral   SpO2: 98% 99%  98%  Weight:      Height:        Intake/Output Summary (Last 24 hours) at 07/13/2021 1345 Last data filed at 07/13/2021 1022 Gross per 24 hour  Intake 3261.82 ml  Output 3000 ml  Net 261.82 ml   Filed Weights   07/10/21 2047  Weight: 79.8 kg     Physical Exam  Gen:- Awake Alert, chronically ill-appearing HEENT:- .AT, No sclera icterus Neck-Supple Neck,No JVD,.  Lungs-improving air movement, no wheezing or rhonchi  CV- S1, S2 normal, regular  Abd-  +ve B.Sounds,  Abd Soft, No tenderness,    Extremity/Skin:- No  edema, pedal pulses present  Psych-affect is appropriate, oriented x3 Neuro-Generalized weakness, some residual/old weakness, No additional/new focal deficits,  Data Reviewed: I have personally reviewed following labs and imaging studies  CBC: Recent Labs  Lab 07/10/21 2048 07/11/21 0550 07/12/21 0523 07/13/21 0541  WBC 22.8* 18.4* 14.7* 11.4*  NEUTROABS 20.9*  --   --   --   HGB 8.3* 6.7* 10.3* 9.1*  HCT 26.6* 22.3* 32.3* 28.6*  MCV 78.5* 79.4* 80.0 81.0  PLT 363 322 286 353   Basic Metabolic Panel: Recent Labs  Lab 07/10/21 2048 07/11/21 0550 07/12/21 0523 07/13/21 0541  NA 125* 132* 139 139  K 2.2* 2.3* 2.4* 3.3*  CL 96* 106 113* 115*  CO2 17* 16* 17* 17*  GLUCOSE 95 102* 111* 100*  BUN 39* 33* 31* 21  CREATININE 3.08* 2.60* 2.33* 1.88*  CALCIUM 8.2* 7.5* 7.8* 7.6*  MG 1.6* 2.0 2.0  --   PHOS  --  3.5  --   --    GFR: Estimated Creatinine Clearance: 36.1 mL/min (A) (by C-G formula based on SCr of 1.88 mg/dL (H)). Liver Function Tests: Recent Labs  Lab 07/10/21 2048 07/11/21 0550 07/12/21 0523 07/13/21 0541  AST 166* 160* 177* 194*   ALT 161* 135* 138* 151*  ALKPHOS 83 71 71 69  BILITOT 0.6 0.4 0.6 0.8  PROT 6.4* 5.5* 5.9* 5.2*  ALBUMIN 2.4* 2.0* 2.0* 1.7*   No results for input(s): LIPASE, AMYLASE in the last 168 hours. Recent Labs  Lab 07/10/21 2112  AMMONIA 15   Coagulation Profile: Recent Labs  Lab 07/10/21 2048 07/12/21 1247 07/13/21 0541  INR 1.1 1.2 1.2   Cardiac Enzymes: Recent Labs  Lab 07/10/21 2048 07/11/21 0550  CKTOTAL 3,313* 3,797*   BNP (last 3 results) No results for input(s): PROBNP in the last 8760 hours. HbA1C: No results for input(s): HGBA1C in the last 72 hours. CBG: Recent Labs  Lab 07/10/21 2048  GLUCAP 95   Lipid Profile: No results for input(s): CHOL, HDL, LDLCALC, TRIG, CHOLHDL, LDLDIRECT in the last 72 hours. Thyroid Function Tests: No results for input(s): TSH, T4TOTAL, FREET4, T3FREE, THYROIDAB in the last 72 hours. Anemia Panel: Recent Labs    07/10/21 2048 07/11/21 0550  VITAMINB12 689  --   FERRITIN  --  124  TIBC  --  266  IRON  --  11*   Urine analysis:    Component Value Date/Time   COLORURINE YELLOW 07/10/2021 2146   APPEARANCEUR CLEAR 07/10/2021 2146   LABSPEC 1.015 07/10/2021 2146   PHURINE 6.0 07/10/2021 2146   GLUCOSEU NEGATIVE 07/10/2021 2146   HGBUR LARGE (A) 07/10/2021 2146   BILIRUBINUR NEGATIVE 07/10/2021 2146   [place] NEGATIVE 07/10/2021 2146   PROTEINUR 100 (A) 07/10/2021 2146   UROBILINOGEN 0.2 09/10/2014 0908   NITRITE NEGATIVE 07/10/2021 2146   LEUKOCYTESUR SMALL (A) 07/10/2021 2146   Sepsis Labs: @LABRCNTIP (procalcitonin:4,lacticidven:4)  ) Recent Results (from the past 240 hour(s))  Blood Culture (routine x 2)     Status: Abnormal   Collection Time: 07/10/21  9:13 PM   Specimen: BLOOD RIGHT ARM  Result Value Ref Range Status   Specimen Description   Final    BLOOD RIGHT ARM BOTTLES DRAWN AEROBIC ONLY Performed at [facility], [address], [place], [place] [address]    Special Requests   Final    Blood  Culture adequate volume Performed at [facility], [place]  [address], [place], [place] [address]    Culture  Setup Time   Final    GRAM POSITIVE COCCI Gram Stain Report Called to,Read Back By and Verified With: Mellody Drown @ 1231 07/11/21 BY STEPHTR AEROBIC BOTTLE ONLY Organism ID to follow CRITICAL RESULT CALLED TO, READ BACK BY AND VERIFIED WITHMellody Drown RN [phone] 07/11/21 A BROWNING Performed at [facility], [place] [address] [place], [place] [address]    Culture STREPTOCOCCUS GROUP G (A)  Final   Report Status 07/13/2021 FINAL  Final   Organism ID, Bacteria STREPTOCOCCUS GROUP G  Final      Susceptibility   Streptococcus group g - MIC*    CLINDAMYCIN <=0.25 SENSITIVE Sensitive     AMPICILLIN <=0.25 SENSITIVE Sensitive     ERYTHROMYCIN <=0.12 SENSITIVE Sensitive     VANCOMYCIN 0.5 SENSITIVE Sensitive     CEFTRIAXONE <=0.12 SENSITIVE Sensitive     LEVOFLOXACIN 0.5 SENSITIVE Sensitive     * STREPTOCOCCUS GROUP G  Blood Culture ID Panel (Reflexed)     Status: Abnormal   Collection Time: 07/10/21  9:13 PM  Result Value Ref Range Status   Enterococcus faecalis NOT DETECTED NOT DETECTED Final   Enterococcus Faecium NOT DETECTED NOT DETECTED Final   Listeria monocytogenes NOT DETECTED NOT DETECTED Final   Staphylococcus species NOT DETECTED NOT DETECTED Final   Staphylococcus aureus (BCID) NOT DETECTED NOT DETECTED Final   Staphylococcus epidermidis NOT DETECTED NOT DETECTED Final   Staphylococcus lugdunensis NOT DETECTED NOT DETECTED Final   Streptococcus species DETECTED (A) NOT DETECTED Final    Comment: Not Enterococcus species, Streptococcus agalactiae, Streptococcus pyogenes, or Streptococcus pneumoniae. CRITICAL RESULT CALLED TO, READ BACK BY AND VERIFIED WITHMellody Drown RN [phone] 07/11/21 A BROWNING    Streptococcus agalactiae NOT DETECTED NOT DETECTED Final   Streptococcus pneumoniae NOT DETECTED NOT DETECTED Final   Streptococcus pyogenes NOT DETECTED NOT DETECTED Final    A.calcoaceticus-baumannii NOT DETECTED NOT DETECTED Final   Bacteroides fragilis NOT DETECTED NOT DETECTED Final   Enterobacterales NOT DETECTED NOT DETECTED Final   Enterobacter cloacae complex NOT DETECTED NOT DETECTED Final   Escherichia coli NOT DETECTED NOT DETECTED Final   Klebsiella aerogenes NOT DETECTED NOT DETECTED Final   Klebsiella oxytoca NOT DETECTED NOT DETECTED Final   Klebsiella pneumoniae NOT DETECTED NOT DETECTED Final   Proteus species NOT DETECTED NOT DETECTED Final   Salmonella species NOT DETECTED NOT DETECTED Final   Serratia marcescens NOT DETECTED NOT DETECTED Final   Haemophilus influenzae NOT DETECTED NOT DETECTED Final   Neisseria meningitidis NOT DETECTED NOT DETECTED Final   Pseudomonas aeruginosa NOT DETECTED NOT DETECTED Final   Stenotrophomonas maltophilia NOT DETECTED NOT DETECTED Final   Candida albicans NOT DETECTED NOT DETECTED Final   Candida auris NOT DETECTED NOT DETECTED Final   Candida glabrata NOT DETECTED NOT DETECTED Final   Candida krusei NOT DETECTED NOT DETECTED Final   Candida parapsilosis NOT DETECTED NOT DETECTED Final   Candida tropicalis NOT DETECTED NOT DETECTED Final   Cryptococcus neoformans/gattii NOT DETECTED NOT DETECTED Final    Comment: Performed at [facility], [address] [place], [place] [address]  Blood Culture (routine x 2)     Status: None (Preliminary result)   Collection Time: 07/10/21  9:24 PM   Specimen: BLOOD LEFT HAND  Result Value Ref Range Status   Specimen Description BLOOD LEFT HAND BOTTLES DRAWN AEROBIC ONLY  Final   Special Requests Blood Culture adequate volume  Final  Culture   Final    NO GROWTH 3 DAYS Performed at [facility], [address] [place], [place] [address]    Report Status PENDING  Incomplete  Urine Culture     Status: Abnormal   Collection Time: 07/10/21  9:46 PM   Specimen: Urine, Clean Catch  Result Value Ref Range Status   Specimen Description   Final    URINE, CLEAN  CATCH Performed at [facility], [address] [place], [place] [address]    Special Requests   Final    NONE Performed at [facility], [address], [place], [place] [address]    Culture >=100,000 COLONIES/mL ESCHERICHIA COLI (A)  Final   Report Status 07/13/2021 FINAL  Final   Organism ID, Bacteria ESCHERICHIA COLI (A)  Final      Susceptibility   Escherichia coli - MIC*    AMPICILLIN >=32 RESISTANT Resistant     CEFAZOLIN <=4 SENSITIVE Sensitive     CEFEPIME <=0.12 SENSITIVE Sensitive     CEFTRIAXONE <=0.25 SENSITIVE Sensitive     CIPROFLOXACIN <=0.25 SENSITIVE Sensitive     GENTAMICIN <=1 SENSITIVE Sensitive     IMIPENEM <=0.25 SENSITIVE Sensitive     NITROFURANTOIN <=16 SENSITIVE Sensitive     TRIMETH/SULFA <=20 SENSITIVE Sensitive     AMPICILLIN/SULBACTAM >=32 RESISTANT Resistant     PIP/TAZO <=4 SENSITIVE Sensitive     * >=100,000 COLONIES/mL ESCHERICHIA COLI  Resp Panel by RT-PCR (Flu A&B, Covid) Nasopharyngeal Swab     Status: None   Collection Time: 07/10/21  9:54 PM   Specimen: Nasopharyngeal Swab; Nasopharyngeal(NP) swabs in vial transport medium  Result Value Ref Range Status   SARS Coronavirus 2 by RT PCR NEGATIVE NEGATIVE Final    Comment: (NOTE) SARS-CoV-2 target nucleic acids are NOT DETECTED.  The SARS-CoV-2 RNA is generally detectable in upper respiratory specimens during the acute phase of infection. The lowest concentration of SARS-CoV-2 viral copies this assay can detect is 138 copies/mL. A negative result does not preclude SARS-Cov-2 infection and should not be used as the sole basis for treatment or other patient management decisions. A negative result may occur with  improper specimen collection/handling, submission of specimen other than nasopharyngeal swab, presence of viral mutation(s) within the areas targeted by this assay, and inadequate number of viral copies(<138 copies/mL). A negative result must be combined with clinical observations,  patient history, and epidemiological information. The expected result is Negative.  Fact Sheet for Patients:  EntrepreneurPulse.com.au  Fact Sheet for Healthcare Providers:  IncredibleEmployment.be  This test is no t yet approved or cleared by the [place] FDA and  has been authorized for detection and/or diagnosis of SARS-CoV-2 by FDA under an Emergency Use Authorization (EUA). This EUA will remain  in effect (meaning this test can be used) for the duration of the COVID-19 declaration under Section 564(b)(1) of the Act, 21 U.S.C.section 360bbb-3(b)(1), unless the authorization is terminated  or revoked sooner.       Influenza A by PCR NEGATIVE NEGATIVE Final   Influenza B by PCR NEGATIVE NEGATIVE Final    Comment: (NOTE) The Xpert Xpress SARS-CoV-2/FLU/RSV plus assay is intended as an aid in the diagnosis of influenza from Nasopharyngeal swab specimens and should not be used as a sole basis for treatment. Nasal washings and aspirates are unacceptable for Xpert Xpress SARS-CoV-2/FLU/RSV testing.  Fact Sheet for Patients: EntrepreneurPulse.com.au  Fact Sheet for Healthcare Providers: IncredibleEmployment.be  This test is not yet approved or cleared by the [place]  FDA and has been authorized for detection and/or diagnosis of SARS-CoV-2 by FDA under an Emergency Use Authorization (EUA). This EUA will remain in effect (meaning this test can be used) for the duration of the COVID-19 declaration under Section 564(b)(1) of the Act, 21 U.S.C. section 360bbb-3(b)(1), unless the authorization is terminated or revoked.  Performed at [facility], [address], [place], [place] [address]       Radiology Studies: ECHOCARDIOGRAM COMPLETE  Result Date: 07/12/2021    ECHOCARDIOGRAM REPORT   Patient Name:   George Valdez Mattioli Date of Exam: 07/12/2021 Medical Rec #:  [id number]              Height:        63.0 in Accession #:    [id number]             Weight:       175.9 lb Date of Birth:  08/12/1955              BSA:          1.831 m Patient Age:    7 years               BP:           119/59 mmHg Patient Gender: M                      HR:           99 bpm. Exam Location:  [facility] Procedure: 2D Echo, Cardiac Doppler and Color Doppler Indications:    Bacteremia  History:        Patient has prior history of Echocardiogram examinations, most                 recent 12/04/2017. CAD, PAD and Stroke; Risk                 Factors:Hypertension.  Sonographer:    Wenda Low Referring Phys: [place]  1. Left ventricular ejection fraction, by estimation, is 55 to 60%. The left ventricle has normal function. The left ventricle has no regional wall motion abnormalities. Left ventricular diastolic parameters are consistent with Grade I diastolic dysfunction (impaired relaxation).  2. Right ventricular systolic function is normal. The right ventricular size is normal. Tricuspid regurgitation signal is inadequate for assessing PA pressure.  3. Thickening of distal anterior MV leaflet likely represents calcification, cannot exclude small vegetation. Consider TEE if clinical suspicion of endocarditis. . The mitral valve is abnormal. Mild mitral valve regurgitation. No evidence of mitral stenosis.  4. The aortic valve is tricuspid. There is moderate calcification of the aortic valve. There is moderate thickening of the aortic valve. Aortic valve regurgitation is not visualized. No aortic stenosis is present.  5. The inferior vena cava is normal in size with greater than 50% respiratory variability, suggesting right atrial pressure of 3 mmHg. FINDINGS  Left Ventricle: Left ventricular ejection fraction, by estimation, is 55 to 60%. The left ventricle has normal function. The left ventricle has no regional wall motion abnormalities. The left ventricular internal cavity size was normal in size. There is   no left ventricular hypertrophy. Left ventricular diastolic parameters are consistent with Grade I diastolic dysfunction (impaired relaxation). Normal left ventricular filling pressure. Right Ventricle: The right ventricular size is normal. No increase in right ventricular wall thickness. Right ventricular systolic function is normal. Tricuspid regurgitation signal is inadequate for assessing PA pressure. Left Atrium: Left atrial size  was normal in size. Right Atrium: Right atrial size was normal in size. Pericardium: There is no evidence of pericardial effusion. Mitral Valve: Thickening of distal anterior MV leaflet likely represents calcification, cannot exclude small vegetation. Consider TEE if clinical suspicion of endocarditis. The mitral valve is abnormal. There is mild thickening of the mitral valve leaflet(s). There is mild calcification of the mitral valve leaflet(s). Mild mitral annular calcification. Mild mitral valve regurgitation. No evidence of mitral valve stenosis. MV peak gradient, 4.1 mmHg. The mean mitral valve gradient is 2.0 mmHg. Tricuspid Valve: The tricuspid valve is normal in structure. Tricuspid valve regurgitation is not demonstrated. No evidence of tricuspid stenosis. Aortic Valve: The aortic valve is tricuspid. There is moderate calcification of the aortic valve. There is moderate thickening of the aortic valve. There is moderate aortic valve annular calcification. Aortic valve regurgitation is not visualized. No aortic stenosis is present. Aortic valve mean gradient measures 3.0 mmHg. Aortic valve peak gradient measures 7.8 mmHg. Aortic valve area, by VTI measures 1.97 cm. Pulmonic Valve: The pulmonic valve was not well visualized. Pulmonic valve regurgitation is not visualized. No evidence of pulmonic stenosis. Aorta: The aortic root is normal in size and structure and the ascending aorta was not well visualized. Venous: The inferior vena cava is normal in size with greater than 50%  respiratory variability, suggesting right atrial pressure of 3 mmHg. IAS/Shunts: No atrial level shunt detected by color flow Doppler.  LEFT VENTRICLE PLAX 2D LVIDd:         4.15 cm     Diastology LVIDs:         3.05 cm     LV e' medial:    6.31 cm/s LV PW:         1.00 cm     LV E/e' medial:  14.5 LV IVS:        1.20 cm     LV e' lateral:   9.03 cm/s LVOT diam:     1.90 cm     LV E/e' lateral: 10.1 LV SV:         52 LV SV Index:   28 LVOT Area:     2.84 cm  LV Volumes (MOD) LV vol d, MOD A2C: 70.7 ml LV vol d, MOD A4C: 54.9 ml LV vol s, MOD A2C: 34.3 ml LV vol s, MOD A4C: 27.0 ml LV SV MOD A2C:     36.4 ml LV SV MOD A4C:     54.9 ml LV SV MOD BP:      34.1 ml RIGHT VENTRICLE RV Basal diam:  2.65 cm RV Mid diam:    2.10 cm RV S prime:     13.40 cm/s TAPSE (M-mode): 2.0 cm LEFT ATRIUM             Index        RIGHT ATRIUM           Index LA diam:        3.60 cm 1.97 cm/m   RA Area:     13.60 cm LA Vol (A2C):   31.8 ml 17.37 ml/m  RA Volume:   27.40 ml  14.96 ml/m LA Vol (A4C):   42.9 ml 23.43 ml/m LA Biplane Vol: 38.2 ml 20.86 ml/m  AORTIC VALVE                    PULMONIC VALVE AV Area (Vmax):    1.82 cm     PV Vmax:  1.08 m/s AV Area (Vmean):   1.70 cm     PV Peak grad:  4.7 mmHg AV Area (VTI):     1.97 cm AV Vmax:           140.00 cm/s AV Vmean:          83.300 cm/s AV VTI:            0.263 m AV Peak Grad:      7.8 mmHg AV Mean Grad:      3.0 mmHg LVOT Vmax:         90.00 cm/s LVOT Vmean:        49.800 cm/s LVOT VTI:          0.183 m LVOT/AV VTI ratio: 0.70  AORTA Ao Root diam: 2.90 cm MITRAL VALVE MV Area (PHT): 3.77 cm     SHUNTS MV Area VTI:   1.49 cm     Systemic VTI:  0.18 m MV Peak grad:  4.1 mmHg     Systemic Diam: 1.90 cm MV Mean grad:  2.0 mmHg MV Vmax:       1.01 m/s MV Vmean:      65.4 cm/s MV Decel Time: 201 msec MV E velocity: 91.30 cm/s MV A velocity: 103.00 cm/s MV E/A ratio:  0.89 Carlyle Dolly MD Electronically signed by Carlyle Dolly MD Signature Date/Time: 07/12/2021/11:14:15  AM    Final      Scheduled Meds:  aspirin EC  81 mg Oral Daily   [START ON 07/14/2021] cephALEXin  500 mg Oral TID   feeding supplement  237 mL Oral TID BM   multivitamin with minerals  1 tablet Oral Daily   pantoprazole (PROTONIX) IV  40 mg Intravenous Q24H   polyethylene glycol  17 g Oral BID   umeclidinium bromide  1 puff Inhalation Daily   Continuous Infusions:  cefTRIAXone (ROCEPHIN)  IV 2 g (07/12/21 2024)   dextrose 5 % and 0.9 % NaCl with KCl 20 mEq/L 125 mL/hr at 07/13/21 1022     LOS: 3 days    Roxan Hockey M.D on 07/13/2021 at 1:45 PM  Go to [email or web address] - for contact info  [facility] - Office  [phone]  If 7PM-7AM, please contact night-coverage [email or web address] Password [email or web address] 07/13/2021, 1:45 PM

## 2021-07-13 NOTE — Consult Note (Signed)
Virtual ID consult   Cc: group g strep bacteremia  HPI: 66 yo male htn, CAD/PAD, hx cva right side paresis, ckd3 admitted 1/14 with recurrent falls/elevated ck and GGS sepsis  Tte shows mild mv anterior leaflet thickening Ucx with ecoli (S cefaz) Bcx with ggs in 1 out of 2 set  No cellulitis changes mentioned on H&P Ct abd/pelv no acute finding  Patient clinically improving  Labs reviewed  Abx transitioned to oral cephalexin   A/p Sepsis Ggs bsi  Would consider ggs bsi a pathogenic event Unclear if the urine cx with pan-sensitive ecoli is contributing, regardless abx same for both  Tte finding in setting of advance age could explain the valve thickening  GGS among the beta-hemolytic strep tend to be higher risk than GAS or GBS to cause endocarditis   If high burden (both initial bcx sets), persistent bacteremia, severe illness or at high risk endocarditis, would check tee   -so far initial bcx only 1 of 2 set positive -repeat bcx -if repeat bcx positive would consider getting tee -discussed with primary team

## 2021-07-13 NOTE — Progress Notes (Signed)
Occupational Therapy Treatment Patient Details Name: George Valdez MRN: [id number] DOB: [birth year] Today's Date: 07/13/2021   History of present illness George Valdez is a 66 y.o. male with medical history significant for hypertension, CAD/PAD, prior stroke with subsequent right-sided weakness, hyperlipidemia, chronic disease stage III who presents to the emergency department via EMS due to fall and generalized weakness.  Patient complained of 3-day onset of increased weakness, decreased food and fluid intake.  Patient patient states that he lives at home with wife who also have similar conditions and was taken to the ED for further evaluation and management.   OT comments  Pt agreeable to OT treatment. Pt able to transfer from bed to chair with squat pivot transfer without AD requiring min G assist. Pt able to completed 2 sets of 10 reps completed of UE exercises. Pt required assist for abduction due to difficulty reaching full range. Assist also needed for last rep of 10 for flexion. Pt updated to SNF recommendation due to history of falling and spouse also being hospitalized. Pt is a high fall risk at this time and would benefit from rehab prior to return home. Pt will benefit from continued OT in the hospital and recommended venue below to increase strength, balance, and endurance for safe ADL's.       Recommendations for follow up therapy are one component of a multi-disciplinary discharge planning process, led by the attending physician.  Recommendations may be updated based on patient status, additional functional criteria and insurance authorization.    Follow Up Recommendations  Skilled nursing-short term rehab (<3 hours/day)    Assistance Recommended at Discharge Intermittent Supervision/Assistance  Patient can return home with the following  A little help with walking and/or transfers;A little help with bathing/dressing/bathroom   Equipment Recommendations  None  recommended by OT          Precautions / Restrictions Precautions Precautions: Fall Restrictions Weight Bearing Restrictions: No       Mobility Bed Mobility Overal bed mobility: Needs Assistance Bed Mobility: Supine to Sit     Supine to sit: Supervision     General bed mobility comments: Laboared movement with use of bed rail.    Transfers Overall transfer level: Needs assistance Equipment used: None Transfers: Sit to/from Stand, Bed to chair/wheelchair/BSC Sit to Stand: Min guard   Squat pivot transfers: Min guard       General transfer comment: Slow labored movement. Pt bracing on arm of chair and rotating into chair form bed. Min G assist needed.     Balance Overall balance assessment: Needs assistance Sitting-balance support: Feet supported, No upper extremity supported Sitting balance-Leahy Scale: Good Sitting balance - Comments: seated at EOB   Standing balance support: Bilateral upper extremity supported, During functional activity (Reliant on chair.) Standing balance-Leahy Scale: Poor Standing balance comment: poor to fair leaning on chair during stand pivot                           ADL either performed or assessed with clinical judgement   ADL                       Lower Body Dressing: Modified independent;Sitting/lateral leans (Pt doffed and donned R sock seated in chair.)               Functional mobility during ADLs: Min guard General ADL Comments: Slow labored movement for functional mobility.  Cognition Arousal/Alertness: Awake/alert Behavior During Therapy: WFL for tasks assessed/performed Overall Cognitive Status: Within Functional Limits for tasks assessed                                          Exercises Exercises: General Upper Extremity General Exercises - Upper Extremity Shoulder Flexion: AROM, 20 reps, Seated (x20 protraction as well) Shoulder ABduction: 20 reps, AAROM,  Seated Shoulder Horizontal ABduction: AROM, 20 reps                 Pertinent Vitals/ Pain       Pain Assessment Pain Assessment: No/denies pain                                                          Frequency  Min 3X/week        Progress Toward Goals  OT Goals(current goals can now be found in the care plan section)  Progress towards OT goals: Progressing toward goals  Acute Rehab OT Goals Patient Stated Goal: to go home OT Goal Formulation: With patient Time For Goal Achievement: 07/26/21 Potential to Achieve Goals: Good ADL Goals Pt Will Perform Grooming: with modified independence;sitting Pt Will Perform Upper Body Bathing: with modified independence;sitting Pt Will Perform Lower Body Bathing: with supervision;sitting/lateral leans Pt Will Perform Upper Body Dressing: with modified independence;sitting Pt Will Perform Lower Body Dressing: with supervision;sitting/lateral leans Pt Will Transfer to Toilet: with supervision;bedside commode;squat pivot transfer Pt Will Perform Toileting - Clothing Manipulation and hygiene: with supervision;sitting/lateral leans Pt/caregiver will Perform Home Exercise Program: Increased strength;Both right and left upper extremity;With Supervision;With written HEP provided  Plan Discharge plan needs to be updated                                    End of Session Equipment Utilized During Treatment: Rolling walker (2 wheels)  OT Visit Diagnosis: Repeated falls (R29.6);Muscle weakness (generalized) (M62.81)   Activity Tolerance Patient tolerated treatment well   Patient Left in chair;with call bell/phone within reach;with chair alarm set   Nurse Communication          Time: [id number] OT Time Calculation (min): 19 min  Charges: OT General Charges $OT Visit: 1 Visit OT Treatments $Therapeutic Exercise: 8-22 mins  George Valdez OT, MOT  Larey Seat 07/13/2021,  11:31 AM

## 2021-07-13 NOTE — Progress Notes (Signed)
Physical Therapy Treatment Patient Details Name: George Valdez MRN: [id number] DOB: 1956-02-03 Today's Date: 07/13/2021   History of Present Illness George Valdez is a 67 y.o. admitted on 07/10/2021 with rhabdomyolysis. PMH: HTN, CAD/PAD, prior stroke with subsequent right-sided weakness, hyperlipidemia    PT Comments    Pt with poor core strength, noted to be sliding forward out of chair, heavy VC to lean trunk forward to scoot bottom back in chair; pt trying to lean back while scooting back, requires heavy cues for technique. Pt tolerates seated BLE strengthening exercises, fatigues easily and requires education on therapeutic process. Pt powers to stand with min guard to steady, unsteady with weight posterior, VC to shift weight anterior and for hand positioning with transfer using RW. Pt able to clear feet with a few sidesteps at EOB, generally unsteady requiring min G without overt LOB or knee buckling noted. Pt requires min A to lift BLE back into bed. Continue to progress as able.   Recommendations for follow up therapy are one component of a multi-disciplinary discharge planning process, led by the attending physician.  Recommendations may be updated based on patient status, additional functional criteria and insurance authorization.  Follow Up Recommendations  Skilled nursing-short term rehab (<3 hours/day)     Assistance Recommended at Discharge Frequent or constant Supervision/Assistance  Patient can return home with the following A lot of help with walking and/or transfers;A lot of help with bathing/dressing/bathroom;Help with stairs or ramp for entrance   Equipment Recommendations  None recommended by PT    Recommendations for Other Services       Precautions / Restrictions Precautions Precautions: Fall Restrictions Weight Bearing Restrictions: No     Mobility  Bed Mobility Overal bed mobility: Needs Assistance Bed Mobility: Sit to Supine  Sit to  supine: Min assist  General bed mobility comments: min A to lift BLE back into bed, slow labored movement    Transfers Overall transfer level: Needs assistance Equipment used: Rolling walker (2 wheels) Transfers: Sit to/from Stand, Bed to chair/wheelchair/BSC Sit to Stand: Min guard Stand pivot transfers: Min guard   General transfer comment: VC for hand placement, slow to power up with weight posterior, VC for flat foot posture and to shift weight anterior, steadying assist to pivot to bed    Ambulation/Gait  General Gait Details: pt declines despite encouragement   Stairs             Wheelchair Mobility    Modified Rankin (Stroke Patients Only)       Balance Overall balance assessment: Needs assistance Sitting-balance support: Feet supported Sitting balance-Leahy Scale: Fair Sitting balance - Comments: posterior lean, min guard   Standing balance support: Bilateral upper extremity supported, During functional activity, Reliant on assistive device for balance Standing balance-Leahy Scale: Poor     Cognition Arousal/Alertness: Awake/alert Behavior During Therapy: WFL for tasks assessed/performed Overall Cognitive Status: Within Functional Limits for tasks assessed     Exercises General Exercises - Lower Extremity Long Arc Quad: Seated, AROM, Strengthening, Both, 5 reps Hip Flexion/Marching: Seated, AROM, Strengthening, Both, 5 reps Toe Raises: Seated, AROM, Strengthening, Both, 10 reps Heel Raises: Seated, AROM, Strengthening, Both, 10 reps    General Comments        Pertinent Vitals/Pain Pain Assessment Pain Assessment: No/denies pain    Home Living                          Prior Function  PT Goals (current goals can now be found in the care plan section) Acute Rehab PT Goals Patient Stated Goal: return home after rehab PT Goal Formulation: With patient Time For Goal Achievement: 07/26/21 Potential to Achieve Goals:  Good Progress towards PT goals: Progressing toward goals    Frequency           PT Plan Current plan remains appropriate    Co-evaluation              AM-PAC PT "6 Clicks" Mobility   Outcome Measure  Help needed turning from your back to your side while in a flat bed without using bedrails?: A Little Help needed moving from lying on your back to sitting on the side of a flat bed without using bedrails?: A Little Help needed moving to and from a bed to a chair (including a wheelchair)?: A Little Help needed standing up from a chair using your arms (e.g., wheelchair or bedside chair)?: A Little Help needed to walk in hospital room?: A Lot Help needed climbing 3-5 steps with a railing? : Total 6 Click Score: 15    End of Session Equipment Utilized During Treatment: Gait belt Activity Tolerance: Patient tolerated treatment well;Patient limited by fatigue Patient left: in bed;with call bell/phone within reach;with bed alarm set Nurse Communication: Mobility status PT Visit Diagnosis: Unsteadiness on feet (R26.81);Other abnormalities of gait and mobility (R26.89);Muscle weakness (generalized) (M62.81)     Time: 6979-4801 PT Time Calculation (min) (ACUTE ONLY): 17 min  Charges:  $Therapeutic Activity: 8-22 mins                     Tori Diamond Jentz PT, DPT 07/13/21, 11:54 AM

## 2021-07-13 NOTE — Progress Notes (Signed)
Patient admitted for recent fall and dx of rhabdomyolysis. Receiving IV fluids continuously.Upon assessment patient's BP noted to be low. Patient had no complaints, asymptomatic. MD paged with order given to start bolus of NS. After BP came up WNL. Rest of the evening was uneventful, though patient did not sleep much throughout the evening.

## 2021-07-13 NOTE — TOC Progression Note (Signed)
Transition of Care [facility]) - Progression Note    Patient Details  Name: George Valdez MRN: [id number] Date of Birth: 01-05-1956  Transition of Care [facility]) CM/SW Contact  Shade Flood, LCSW Phone Number: 07/13/2021, 1:33 PM  Clinical Narrative:     TOC following. Attempted to review SNF bed offers with pt though he seemed disoriented. Pt agreed for [facility] to contact his step daughter, Anderson Malta. Spoke with Anderson Malta to provide bed offers. She discussed with her mother, pt's wife, and they have selected [facility]. Per MD, pt needs repeat blood cultures and will not be ready for dc until at least Thursday.  Updated Bryson Ha at [facility]. She states that they can accept and that if pt is agreeable, they recommend the newest covid booster shot prior to dc.  Assigned TOC will follow and continue to assess and assist with dc planning.  Expected Discharge Plan: [place] Barriers to Discharge: Continued Medical Work up  Expected Discharge Plan and Services Expected Discharge Plan: [place] In-house Referral: Clinical Social Work   Post Acute Care Choice: [place] Living arrangements for the past 2 months: Apartment                                       Social Determinants of Health (SDOH) Interventions    Readmission Risk Interventions No flowsheet data found.

## 2021-07-13 NOTE — Progress Notes (Signed)
Subjective: Feeling better this morning.  Had 3 bowel movements yesterday that were large in volume.  Denies BRBPR or melena, abdominal pain, nausea, vomiting.  Reports his appetite is improving since he has been able to move his bowels.  Denies shortness of breath, chest pain, lightheadedness, presyncope.  Denies any tylenol use or alcohol use. Reports he last drank 5 years ago, but was never a heavy drinker. Tried marijuana when he was 31, but no other history of drug use. No new medications recently. Can't remember the last time he took Plavix.   Spoke with nursing staff who also report no BRBPR or melena.  I do note he had an episode of hypotension overnight, improved with bolus of normal saline. 2 BMs recorded in the chart yesterday.  Objective: Vital signs in last 24 hours: Temp:  [97.6 F (36.4 C)-98.2 F (36.8 C)] 97.6 F (36.4 C) (01/17 0615) Pulse Rate:  [93-102] 93 (01/17 0615) Resp:  [14-23] 21 (01/17 0615) BP: (82-131)/(59-67) 118/67 (01/17 0615) SpO2:  [98 %-99 %] 98 % (01/17 0708) Last BM Date: 07/12/21 General:   Alert and oriented, pleasant, NAD, sitting in recliner.  Head:  Normocephalic and atraumatic. Eyes:  No icterus, sclera clear. Conjuctiva pink.  Lungs: Clear to auscultation bilaterally, without wheezing, rales, or rhonchi.  Abdomen:  Bowel sounds present, soft, non-tender, non-distended. No HSM or hernias noted. No rebound or guarding. No masses appreciated  Msk:  Symmetrical without gross deformities. Normal posture. Extremities:  Without edema. Neurologic:  Alert and  oriented x4;  grossly normal neurologically. Skin:  Warm and dry, intact without significant lesions.  Psych:  Normal mood and affect.  Intake/Output from previous day: 01/16 0701 - 01/17 0700 In: 2643.5 [P.O.:1680; I.V.:963.5] Out: 2200 [Urine:2200] Intake/Output this shift: Total I/O In: 480 [P.O.:480] Out: 800 [Urine:800]  Lab Results: Recent Labs    07/11/21 0550  07/12/21 0523 07/13/21 0541  WBC 18.4* 14.7* 11.4*  HGB 6.7* 10.3* 9.1*  HCT 22.3* 32.3* 28.6*  PLT 322 286 281   BMET Recent Labs    07/11/21 0550 07/12/21 0523 07/13/21 0541  NA 132* 139 139  K 2.3* 2.4* 3.3*  CL 106 113* 115*  CO2 16* 17* 17*  GLUCOSE 102* 111* 100*  BUN 33* 31* 21  CREATININE 2.60* 2.33* 1.88*  CALCIUM 7.5* 7.8* 7.6*   LFT Recent Labs    07/11/21 0550 07/12/21 0523 07/13/21 0541  PROT 5.5* 5.9* 5.2*  ALBUMIN 2.0* 2.0* 1.7*  AST 160* 177* 194*  ALT 135* 138* 151*  ALKPHOS 71 71 69  BILITOT 0.4 0.6 0.8   PT/INR Recent Labs    07/12/21 1247 07/13/21 0541  LABPROT 15.1 15.1  INR 1.2 1.2   Hepatitis Panel Recent Labs    07/12/21 1247  HEPBSAG NON REACTIVE  HCVAB NON REACTIVE  HEPAIGM NON REACTIVE  HEPBIGM NON REACTIVE   Studies/Results: ECHOCARDIOGRAM COMPLETE  Result Date: 07/12/2021    ECHOCARDIOGRAM REPORT   Patient Name:   KALI AMBLER Batzel Date of Exam: 07/12/2021 Medical Rec #:  [id number]              Height:       63.0 in Accession #:    [id number]             Weight:       175.9 lb Date of Birth:  26-Jun-1956              BSA:  1.831 m Patient Age:    66 years               BP:           119/59 mmHg Patient Gender: M                      HR:           99 bpm. Exam Location:  [facility] Procedure: 2D Echo, Cardiac Doppler and Color Doppler Indications:    Bacteremia  History:        Patient has prior history of Echocardiogram examinations, most                 recent 12/04/2017. CAD, PAD and Stroke; Risk                 Factors:Hypertension.  Sonographer:    Wenda Low Referring Phys: Adrian  1. Left ventricular ejection fraction, by estimation, is 55 to 60%. The left ventricle has normal function. The left ventricle has no regional wall motion abnormalities. Left ventricular diastolic parameters are consistent with Grade I diastolic dysfunction (impaired relaxation).  2. Right ventricular  systolic function is normal. The right ventricular size is normal. Tricuspid regurgitation signal is inadequate for assessing PA pressure.  3. Thickening of distal anterior MV leaflet likely represents calcification, cannot exclude small vegetation. Consider TEE if clinical suspicion of endocarditis. . The mitral valve is abnormal. Mild mitral valve regurgitation. No evidence of mitral stenosis.  4. The aortic valve is tricuspid. There is moderate calcification of the aortic valve. There is moderate thickening of the aortic valve. Aortic valve regurgitation is not visualized. No aortic stenosis is present.  5. The inferior vena cava is normal in size with greater than 50% respiratory variability, suggesting right atrial pressure of 3 mmHg. FINDINGS  Left Ventricle: Left ventricular ejection fraction, by estimation, is 55 to 60%. The left ventricle has normal function. The left ventricle has no regional wall motion abnormalities. The left ventricular internal cavity size was normal in size. There is  no left ventricular hypertrophy. Left ventricular diastolic parameters are consistent with Grade I diastolic dysfunction (impaired relaxation). Normal left ventricular filling pressure. Right Ventricle: The right ventricular size is normal. No increase in right ventricular wall thickness. Right ventricular systolic function is normal. Tricuspid regurgitation signal is inadequate for assessing PA pressure. Left Atrium: Left atrial size was normal in size. Right Atrium: Right atrial size was normal in size. Pericardium: There is no evidence of pericardial effusion. Mitral Valve: Thickening of distal anterior MV leaflet likely represents calcification, cannot exclude small vegetation. Consider TEE if clinical suspicion of endocarditis. The mitral valve is abnormal. There is mild thickening of the mitral valve leaflet(s). There is mild calcification of the mitral valve leaflet(s). Mild mitral annular calcification. Mild  mitral valve regurgitation. No evidence of mitral valve stenosis. MV peak gradient, 4.1 mmHg. The mean mitral valve gradient is 2.0 mmHg. Tricuspid Valve: The tricuspid valve is normal in structure. Tricuspid valve regurgitation is not demonstrated. No evidence of tricuspid stenosis. Aortic Valve: The aortic valve is tricuspid. There is moderate calcification of the aortic valve. There is moderate thickening of the aortic valve. There is moderate aortic valve annular calcification. Aortic valve regurgitation is not visualized. No aortic stenosis is present. Aortic valve mean gradient measures 3.0 mmHg. Aortic valve peak gradient measures 7.8 mmHg. Aortic valve area, by VTI measures 1.97 cm. Pulmonic Valve: The pulmonic valve  was not well visualized. Pulmonic valve regurgitation is not visualized. No evidence of pulmonic stenosis. Aorta: The aortic root is normal in size and structure and the ascending aorta was not well visualized. Venous: The inferior vena cava is normal in size with greater than 50% respiratory variability, suggesting right atrial pressure of 3 mmHg. IAS/Shunts: No atrial level shunt detected by color flow Doppler.  LEFT VENTRICLE PLAX 2D LVIDd:         4.15 cm     Diastology LVIDs:         3.05 cm     LV e' medial:    6.31 cm/s LV PW:         1.00 cm     LV E/e' medial:  14.5 LV IVS:        1.20 cm     LV e' lateral:   9.03 cm/s LVOT diam:     1.90 cm     LV E/e' lateral: 10.1 LV SV:         52 LV SV Index:   28 LVOT Area:     2.84 cm  LV Volumes (MOD) LV vol d, MOD A2C: 70.7 ml LV vol d, MOD A4C: 54.9 ml LV vol s, MOD A2C: 34.3 ml LV vol s, MOD A4C: 27.0 ml LV SV MOD A2C:     36.4 ml LV SV MOD A4C:     54.9 ml LV SV MOD BP:      34.1 ml RIGHT VENTRICLE RV Basal diam:  2.65 cm RV Mid diam:    2.10 cm RV S prime:     13.40 cm/s TAPSE (M-mode): 2.0 cm LEFT ATRIUM             Index        RIGHT ATRIUM           Index LA diam:        3.60 cm 1.97 cm/m   RA Area:     13.60 cm LA Vol (A2C):   31.8  ml 17.37 ml/m  RA Volume:   27.40 ml  14.96 ml/m LA Vol (A4C):   42.9 ml 23.43 ml/m LA Biplane Vol: 38.2 ml 20.86 ml/m  AORTIC VALVE                    PULMONIC VALVE AV Area (Vmax):    1.82 cm     PV Vmax:       1.08 m/s AV Area (Vmean):   1.70 cm     PV Peak grad:  4.7 mmHg AV Area (VTI):     1.97 cm AV Vmax:           140.00 cm/s AV Vmean:          83.300 cm/s AV VTI:            0.263 m AV Peak Grad:      7.8 mmHg AV Mean Grad:      3.0 mmHg LVOT Vmax:         90.00 cm/s LVOT Vmean:        49.800 cm/s LVOT VTI:          0.183 m LVOT/AV VTI ratio: 0.70  AORTA Ao Root diam: 2.90 cm MITRAL VALVE MV Area (PHT): 3.77 cm     SHUNTS MV Area VTI:   1.49 cm     Systemic VTI:  0.18 m MV Peak grad:  4.1 mmHg     Systemic Diam:  1.90 cm MV Mean grad:  2.0 mmHg MV Vmax:       1.01 m/s MV Vmean:      65.4 cm/s MV Decel Time: 201 msec MV E velocity: 91.30 cm/s MV A velocity: 103.00 cm/s MV E/A ratio:  0.89 Carlyle Dolly MD Electronically signed by Carlyle Dolly MD Signature Date/Time: 07/12/2021/11:14:15 AM    Final     Assessment: 66 y.o. year old male with history of CAD, PAD, prior stroke in the past, CKD, presenting via EMS after being found in debilitating conditions without oral intake for several days and multiple falls. Admitted with streptococcal bacteremia with sepsis, echo with possible mitral valve vegetation with consideration or TEE, rhabdomyolysis, UTI, AKI on CKD, and multiple metabolic derangements in setting of dehydration and failure to thrive. GI consulted due to anemia.  Microcytic anemia:    On Plavix and aspirin as outpatient. Unclear last dose of outpatient Plavix, but this is on hold during hospitalization. Found to have Hgb 8.3, with last record in chart Hgb in the 11/12 range. Hgb down to 6.7 on 1/15.  Ferritin 124, iron 11 (L), saturation 4% (L).  No Hemoccult obtained.  He received 2 units PRBCs on 1/15 with hemoglobin improved to 10.3 yesterday, 9.1 today.  He had 2 good sized  bowel movements yesterday with no evidence of overt GI bleeding.  Suspect decline in Hgb today is likely equilibration and hemodilution.  He denies overt bleeding prior to admission.  Per patient, last colonoscopy/EGD about 8 years ago and [place].  Patient will benefit from repeat EGD and colonoscopy for further evaluation.  Could consider inpatient prior to discharge pending clinical course versus outpatient if hemoglobin remains stable and no overt GI bleeding.  Elevated transaminases: Previously normal in 2018.  Moderately elevated on admission with AST 166, ALT 161, alk phos and T bili within normal limits.  Acute hepatitis panel negative.  Iron panel not consistent with hemochromatosis.  Liver was unremarkable on CT without contrast this admission. 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  Denies starting any new medications or Tylenol use.  Elevation is likely secondary to rhabdomyolysis, sepsis, and hypotension on admission. Also with hypotensive episode overnight, now improved s/p NS bolus.  Overall, LFTs are stable/slightly increased with AST 194, ALT 151, alk phos and T bili remain within normal limits.  INR also normal.  Will continue to monitor.   Fecal impaction: Present on admission.  Patient had reported diarrhea prior to admission, but this was likely overflow incontinence.  Attempted fecal disimpaction yesterday, but unsuccessful.  He was started on MiraLAX twice daily and enema yesterday s/p 2 large BMs yesterday.  We will continue MiraLAX twice daily.  Regarding his multiple other acute illnesses: He is on antibiotics for bacteremia and UTI, receiving IV fluids for rhabdomyolysis, AKI, and sepsis. ID has been consulted. AKI is improving.  WBC count remains elevated, but is trending down.  Plan: Continue to monitor H&H and for overt GI bleeding. Recommend colonoscopy/EGD when clinically stable.  Timing to be determined. Consider outpatient vs  inpatient pending clinical course.  Continue to hold Plavix. Continue Protonix 40 mg daily. Obtain hemoccult as already ordered.   Continue to follow HFP and INR daily. Avoid hepatotoxic medications. Continue MiraLAX twice daily. Continue supportive measures and management of other acute illness' per hospitalist.     LOS: 3 days    07/13/2021, 10:03 AM   Aliene Altes, [facility]

## 2021-07-13 NOTE — Progress Notes (Signed)
Nutrition Follow-up  DOCUMENTATION CODES:  Non-severe (moderate) malnutrition in context of chronic illness, Obesity unspecified  INTERVENTION:  Continue regular diet.  Increase Ensure TID to QID.  Continue MVI with minerals daily.  Continue to encourage PO and supplement intake.  NUTRITION DIAGNOSIS:  Moderate Malnutrition related to chronic illness (CAD/PAD, CKD) as evidenced by moderate fat depletion, moderate muscle depletion. - new diagnosis  GOAL:  Patient will meet greater than or equal to 90% of their needs. - progressing  MONITOR:  PO intake, Supplement acceptance, Labs, Weight trends, Skin, I & O's  REASON FOR ASSESSMENT:  Consult Assessment of nutrition requirement/status  ASSESSMENT:  George Valdez is a 66 y.o. male with medical history significant for hypertension, CAD/PAD, prior stroke with subsequent right-sided weakness, hyperlipidemia, chronic disease stage III who presents to the emergency department via EMS due to fall and generalized weakness.  Patient complained of 3-day onset of increased weakness, decreased food and fluid intake.  Patient patient states that he lives at home with wife who also have similar conditions and was taken to the ED for further evaluation and management.  When EMS team first arrived to the house, he was met on the floor, he was picked up and placed on his electric chair while wife was being taken to the ED.  On return to check on patient, he was already back to the floor.  He only complained of weakness and denies chest pain, shortness of breath, fever, chills, headache, blurry vision, numbness and tingling.  Spoke with pt at bedside. Pt appeared a bit of confused while speaking with RD.   He reported that he was eating well and drinking 4 Ensures daily while at home. He would like to continue this while in the hospital.  Pt with good meal intake documentation - an average of 64% over the past 7 meals.  Pt denies any recent  changes in his weight.  Of note, no edema noted.  Supplements: Ensure TID  Medications: reviewed; MVI with minerals, miralax BID, Klor-Con 40 mEq, D5 with NaCl @ 125 ml/hr, Xanax PO PRN (given once today)  Labs: reviewed; K 3.3 (L), Glucose 100 (H), Crt 1.88 (H - trending down)  NUTRITION - FOCUSED PHYSICAL EXAM: Flowsheet Row Most Recent Value  Orbital Region Moderate depletion  Upper Arm Region Moderate depletion  Thoracic and Lumbar Region Mild depletion  Buccal Region Moderate depletion  Temple Region Severe depletion  Clavicle Bone Region Severe depletion  Clavicle and Acromion Bone Region Severe depletion  Scapular Bone Region Moderate depletion  Dorsal Hand Moderate depletion  Patellar Region Moderate depletion  Anterior Thigh Region Moderate depletion  Posterior Calf Region Moderate depletion  Edema (RD Assessment) None  Hair Reviewed  Eyes Reviewed  Mouth Reviewed  Skin Reviewed  Nails Reviewed   Diet Order:   Diet Order             Diet regular Room service appropriate? Yes; Fluid consistency: Thin  Diet effective now                  EDUCATION NEEDS:  Education needs have been addressed  Skin:  Skin Assessment: Reviewed RN Assessment  Last BM:  07/12/21  Height:  Ht Readings from Last 1 Encounters:  07/10/21 '5\' 3"'  (1.6 m)   Weight:  Wt Readings from Last 1 Encounters:  07/10/21 79.8 kg   BMI:  Body mass index is 31.16 kg/m.  Estimated Nutritional Needs:  Kcal:  1900-2100 Protein:  85-100 grams  Fluid:  >1.9 L  Derrel Nip, RD, LDN (she/her/hers) Clinical Inpatient Dietitian RD Pager/After-Hours/Weekend Pager # in [phone]

## 2021-07-14 DIAGNOSIS — T796XXD Traumatic ischemia of muscle, subsequent encounter: Secondary | ICD-10-CM

## 2021-07-14 DIAGNOSIS — E871 Hypo-osmolality and hyponatremia: Secondary | ICD-10-CM

## 2021-07-14 DIAGNOSIS — E440 Moderate protein-calorie malnutrition: Secondary | ICD-10-CM | POA: Insufficient documentation

## 2021-07-14 DIAGNOSIS — B954 Other streptococcus as the cause of diseases classified elsewhere: Secondary | ICD-10-CM

## 2021-07-14 DIAGNOSIS — I639 Cerebral infarction, unspecified: Secondary | ICD-10-CM

## 2021-07-14 LAB — COMPREHENSIVE METABOLIC PANEL
ALT: 221 U/L — ABNORMAL HIGH (ref 0–44)
AST: 251 U/L — ABNORMAL HIGH (ref 15–41)
Albumin: 1.9 g/dL — ABNORMAL LOW (ref 3.5–5.0)
Alkaline Phosphatase: 72 U/L (ref 38–126)
Anion gap: 9 (ref 5–15)
BUN: 17 mg/dL (ref 8–23)
CO2: 17 mmol/L — ABNORMAL LOW (ref 22–32)
Calcium: 8 mg/dL — ABNORMAL LOW (ref 8.9–10.3)
Chloride: 117 mmol/L — ABNORMAL HIGH (ref 98–111)
Creatinine, Ser: 1.6 mg/dL — ABNORMAL HIGH (ref 0.61–1.24)
GFR, Estimated: 47 mL/min — ABNORMAL LOW (ref >60)
Glucose, Bld: 89 mg/dL (ref 70–99)
Potassium: 3.4 mmol/L — ABNORMAL LOW (ref 3.5–5.1)
Sodium: 143 mmol/L (ref 135–145)
Total Bilirubin: 0.5 mg/dL (ref 0.3–1.2)
Total Protein: 5.6 g/dL — ABNORMAL LOW (ref 6.5–8.1)

## 2021-07-14 LAB — CBC
HCT: 30 % — ABNORMAL LOW (ref 39.0–52.0)
Hemoglobin: 9.5 g/dL — ABNORMAL LOW (ref 13.0–17.0)
MCH: 25.6 pg — ABNORMAL LOW (ref 26.0–34.0)
MCHC: 31.7 g/dL (ref 30.0–36.0)
MCV: 80.9 fL (ref 80.0–100.0)
Platelets: 302 10*3/uL (ref 150–400)
RBC: 3.71 MIL/uL — ABNORMAL LOW (ref 4.22–5.81)
RDW: 18 % — ABNORMAL HIGH (ref 11.5–15.5)
WBC: 10.5 10*3/uL (ref 4.0–10.5)
nRBC: 0 % (ref 0.0–0.2)

## 2021-07-14 LAB — VITAMIN B1: Vitamin B1 (Thiamine): 305.2 nmol/L — ABNORMAL HIGH (ref 66.5–200.0)

## 2021-07-14 LAB — CK: Total CK: 1665 U/L — ABNORMAL HIGH (ref 49–397)

## 2021-07-14 LAB — AMMONIA: Ammonia: 12 umol/L (ref 9–35)

## 2021-07-14 MED ORDER — POTASSIUM CHLORIDE CRYS ER 20 MEQ PO TBCR
40.0000 meq | EXTENDED_RELEASE_TABLET | Freq: Once | ORAL | Status: AC
Start: 2021-07-14 — End: 2021-07-14
  Administered 2021-07-14: 40 meq via ORAL
  Filled 2021-07-14: qty 2

## 2021-07-14 MED ORDER — PANTOPRAZOLE SODIUM 40 MG PO TBEC
40.0000 mg | DELAYED_RELEASE_TABLET | Freq: Every day | ORAL | Status: DC
  Administered 2021-07-14 – 2021-07-16 (×3): 40 mg via ORAL
  Filled 2021-07-14 (×3): qty 1

## 2021-07-14 NOTE — TOC Progression Note (Signed)
Transition of Care [facility]) - Progression Note    Patient Details  Name: George Valdez MRN: [id number] Date of Birth: 10/30/1955  Transition of Care [facility]) CM/SW Contact  Salome Arnt,  Phone Number: 07/14/2021, 11:06 AM  Clinical Narrative:  Per MD, possible d/c tomorrow. Ebony Hail at [facility] updated. LCSW left voicemail for pt's wife to discuss possible COVID booster at [facility] request.      Expected Discharge Plan: Skilled Nursing Facility Barriers to Discharge: Continued Medical Work up  Expected Discharge Plan and Services Expected Discharge Plan: [place] In-house Referral: Clinical Social Work   Post Acute Care Choice: [place] Living arrangements for the past 2 months: Apartment                                       Social Determinants of Health (SDOH) Interventions    Readmission Risk Interventions No flowsheet data found.

## 2021-07-14 NOTE — Progress Notes (Signed)
Pt was transferred from bed to the chair by staff. Pt stated they wanted to get back in the bed. This nurse assisted staff with the transfer. Pt was cooperative with taking medicines but refused his ensures. Bed in lowest position with call light and bedside table within pt reach.

## 2021-07-14 NOTE — Progress Notes (Signed)
Subjective: Patient reports he is doing okay overall today. He denies any abdominal pain, nausea, or vomiting. He reports that he had 3-4 BMs this morning, states stools were loose. Denies any rectal bleeding or melena. He reports he did not eat breakfast, states he is hungry but does not like the food here.   Objective: Vital signs in last 24 hours: Temp:  [97.3 F (36.3 C)-97.8 F (36.6 C)] 97.8 F (36.6 C) (01/18 0327) Pulse Rate:  [97-105] 105 (01/18 0327) Resp:  [16-19] 19 (01/18 0327) BP: (107-157)/(51-114) 157/77 (01/18 0327) SpO2:  [96 %-98 %] 96 % (01/18 0710) Last BM Date: 07/14/21 General:   Alert, pleasant  Head:  Normocephalic and atraumatic. Eyes:  No icterus, sclera clear. Conjuctiva pink.  Mouth:  Without lesions, mucosa pink and moist.  Heart:  S1, S2 present, no murmurs noted.  Lungs: Clear to auscultation bilaterally, without wheezing, rales, or rhonchi.  Abdomen:  Bowel sounds present, soft, non-tender, non-distended. No HSM or hernias noted. No rebound or guarding. No masses appreciated  Msk:  Symmetrical without gross deformities. Normal posture. Pulses:  Normal pulses noted. Extremities:  Without clubbing or edema. Neurologic:  Alert and oriented to person and time, disoriented to place and situation, no asterixis Skin:  Warm and dry, intact without significant lesions.  Psych:  Alert and cooperative.   Intake/Output from previous day: 01/17 0701 - 01/18 0700 In: 2769.5 [P.O.:1440; I.V.:1129.5; IV Piggyback:200] Out: 2250 [Urine:2250]   Lab Results: Recent Labs    07/12/21 0523 07/13/21 0541 07/14/21 0655  WBC 14.7* 11.4* 10.5  HGB 10.3* 9.1* 9.5*  HCT 32.3* 28.6* 30.0*  PLT 286 281 302   BMET Recent Labs    07/12/21 0523 07/13/21 0541 07/14/21 0655  NA 139 139 143  K 2.4* 3.3* 3.4*  CL 113* 115* 117*  CO2 17* 17* 17*  GLUCOSE 111* 100* 89  BUN 31* 21 17  CREATININE 2.33* 1.88* 1.60*  CALCIUM 7.8* 7.6* 8.0*   LFT Recent Labs     07/12/21 0523 07/13/21 0541 07/14/21 0655  PROT 5.9* 5.2* 5.6*  ALBUMIN 2.0* 1.7* 1.9*  AST 177* 194* 251*  ALT 138* 151* 221*  ALKPHOS 71 69 72  BILITOT 0.6 0.8 0.5   PT/INR Recent Labs    07/12/21 1247 07/13/21 0541  LABPROT 15.1 15.1  INR 1.2 1.2   Hepatitis Panel Recent Labs    07/12/21 1247  HEPBSAG NON REACTIVE  HCVAB NON REACTIVE  HEPAIGM NON REACTIVE  HEPBIGM NON REACTIVE    Studies/Results: ECHOCARDIOGRAM COMPLETE  Result Date: 07/12/2021    ECHOCARDIOGRAM REPORT   Patient Name:   George Valdez Date of Exam: 07/12/2021 Medical Rec #:  [id number]              Height:       63.0 in Accession #:    [id number]             Weight:       175.9 lb Date of Birth:  02-08-1956              BSA:          1.831 m Patient Age:    66 years               BP:           119/59 mmHg Patient Gender: M  HR:           99 bpm. Exam Location:  [facility] Procedure: 2D Echo, Cardiac Doppler and Color Doppler Indications:    Bacteremia  History:        Patient has prior history of Echocardiogram examinations, most                 recent 12/04/2017. CAD, PAD and Stroke; Risk                 Factors:Hypertension.  Sonographer:    Wenda Low Referring Phys: [facility]  1. Left ventricular ejection fraction, by estimation, is 55 to 60%. The left ventricle has normal function. The left ventricle has no regional wall motion abnormalities. Left ventricular diastolic parameters are consistent with Grade I diastolic dysfunction (impaired relaxation).  2. Right ventricular systolic function is normal. The right ventricular size is normal. Tricuspid regurgitation signal is inadequate for assessing PA pressure.  3. Thickening of distal anterior MV leaflet likely represents calcification, cannot exclude small vegetation. Consider TEE if clinical suspicion of endocarditis. . The mitral valve is abnormal. Mild mitral valve regurgitation. No evidence of mitral  stenosis.  4. The aortic valve is tricuspid. There is moderate calcification of the aortic valve. There is moderate thickening of the aortic valve. Aortic valve regurgitation is not visualized. No aortic stenosis is present.  5. The inferior vena cava is normal in size with greater than 50% respiratory variability, suggesting right atrial pressure of 3 mmHg. FINDINGS  Left Ventricle: Left ventricular ejection fraction, by estimation, is 55 to 60%. The left ventricle has normal function. The left ventricle has no regional wall motion abnormalities. The left ventricular internal cavity size was normal in size. There is  no left ventricular hypertrophy. Left ventricular diastolic parameters are consistent with Grade I diastolic dysfunction (impaired relaxation). Normal left ventricular filling pressure. Right Ventricle: The right ventricular size is normal. No increase in right ventricular wall thickness. Right ventricular systolic function is normal. Tricuspid regurgitation signal is inadequate for assessing PA pressure. Left Atrium: Left atrial size was normal in size. Right Atrium: Right atrial size was normal in size. Pericardium: There is no evidence of pericardial effusion. Mitral Valve: Thickening of distal anterior MV leaflet likely represents calcification, cannot exclude small vegetation. Consider TEE if clinical suspicion of endocarditis. The mitral valve is abnormal. There is mild thickening of the mitral valve leaflet(s). There is mild calcification of the mitral valve leaflet(s). Mild mitral annular calcification. Mild mitral valve regurgitation. No evidence of mitral valve stenosis. MV peak gradient, 4.1 mmHg. The mean mitral valve gradient is 2.0 mmHg. Tricuspid Valve: The tricuspid valve is normal in structure. Tricuspid valve regurgitation is not demonstrated. No evidence of tricuspid stenosis. Aortic Valve: The aortic valve is tricuspid. There is moderate calcification of the aortic valve. There is  moderate thickening of the aortic valve. There is moderate aortic valve annular calcification. Aortic valve regurgitation is not visualized. No aortic stenosis is present. Aortic valve mean gradient measures 3.0 mmHg. Aortic valve peak gradient measures 7.8 mmHg. Aortic valve area, by VTI measures 1.97 cm. Pulmonic Valve: The pulmonic valve was not well visualized. Pulmonic valve regurgitation is not visualized. No evidence of pulmonic stenosis. Aorta: The aortic root is normal in size and structure and the ascending aorta was not well visualized. Venous: The inferior vena cava is normal in size with greater than 50% respiratory variability, suggesting right atrial pressure of 3 mmHg. IAS/Shunts: No atrial level shunt  detected by color flow Doppler.  LEFT VENTRICLE PLAX 2D LVIDd:         4.15 cm     Diastology LVIDs:         3.05 cm     LV e' medial:    6.31 cm/s LV PW:         1.00 cm     LV E/e' medial:  14.5 LV IVS:        1.20 cm     LV e' lateral:   9.03 cm/s LVOT diam:     1.90 cm     LV E/e' lateral: 10.1 LV SV:         52 LV SV Index:   28 LVOT Area:     2.84 cm  LV Volumes (MOD) LV vol d, MOD A2C: 70.7 ml LV vol d, MOD A4C: 54.9 ml LV vol s, MOD A2C: 34.3 ml LV vol s, MOD A4C: 27.0 ml LV SV MOD A2C:     36.4 ml LV SV MOD A4C:     54.9 ml LV SV MOD BP:      34.1 ml RIGHT VENTRICLE RV Basal diam:  2.65 cm RV Mid diam:    2.10 cm RV S prime:     13.40 cm/s TAPSE (M-mode): 2.0 cm LEFT ATRIUM             Index        RIGHT ATRIUM           Index LA diam:        3.60 cm 1.97 cm/m   RA Area:     13.60 cm LA Vol (A2C):   31.8 ml 17.37 ml/m  RA Volume:   27.40 ml  14.96 ml/m LA Vol (A4C):   42.9 ml 23.43 ml/m LA Biplane Vol: 38.2 ml 20.86 ml/m  AORTIC VALVE                    PULMONIC VALVE AV Area (Vmax):    1.82 cm     PV Vmax:       1.08 m/s AV Area (Vmean):   1.70 cm     PV Peak grad:  4.7 mmHg AV Area (VTI):     1.97 cm AV Vmax:           140.00 cm/s AV Vmean:          83.300 cm/s AV VTI:             0.263 m AV Peak Grad:      7.8 mmHg AV Mean Grad:      3.0 mmHg LVOT Vmax:         90.00 cm/s LVOT Vmean:        49.800 cm/s LVOT VTI:          0.183 m LVOT/AV VTI ratio: 0.70  AORTA Ao Root diam: 2.90 cm MITRAL VALVE MV Area (PHT): 3.77 cm     SHUNTS MV Area VTI:   1.49 cm     Systemic VTI:  0.18 m MV Peak grad:  4.1 mmHg     Systemic Diam: 1.90 cm MV Mean grad:  2.0 mmHg MV Vmax:       1.01 m/s MV Vmean:      65.4 cm/s MV Decel Time: 201 msec MV E velocity: 91.30 cm/s MV A velocity: 103.00 cm/s MV E/A ratio:  0.89 Carlyle Dolly MD Electronically signed by Carlyle Dolly MD Signature Date/Time: 07/12/2021/11:14:15 AM  Final     Assessment: 66 year old male with hx of CAD, PAD, prior stroke, CKD, who presented via EMS to the ED after being found in debilitating conditions w/o oral intake for several days, as well as multiple falls. Admitted with streptococcal bacteremia with sepsis, ECHO with possible mitral valve vegetation with consideration of TEE, rhabdomyolysis, UTI, AKI on CKD, and multiple metabolic derangements in setting of dehydration and Failure to thrive. GI consulted for anemia.  Anemia: microcytic. Hgb 9.5 today. On Plavix and ASA outpatient, unclear last dosage prior to admission, plavix on hold currently. Hgb 8.3, on admission appears baseline is 11-12 range, hgb down to 6.7 on 1/15, ferritin 124, iron 11, saturation 4%. Received 2 units PRBCs on 1/15 with improvement of hb to 10.3. no overt GI bleeding this admission. Last EGD/TCS about 8 years ago in [place], [place]. Recommend repeat EGD/Colonoscopy for further evaluation, consider inpatient if hemoglobin continues to decline or overt GI bleeding occurs, otherwise, given other multiple metabolic derangements/acute illness, consider outpatient endoscopic procedures.  Elevated LFTs: Last reviewable LFTs 2018, WNL. Moderately elevated on admission with AST 166, ALT 161, alk phos and T bili WNL, though more elevated today with AST 251, ALT  221. INR remains WNL, 1.2 yesterday. Acute Hep panel negative, iron panel not consistent with hemochromatosis. No obvious liver abnormalities on CT without contrast during admission. Does not drink alcohol. No new meds or tylenol use.  Suspect elevation in LFTs is secondary to sepsis/ Rhabdomyolysis, possibly worsened by acute hypotensive episode as they began trending up Monday. AIH serologies pending.  Patient appears disoriented today, able to tell me his name and year, but does not know where he is or why, patient was talking about other topics that did not correlate with current situation. Likely related to multiple metabolic abnormalities, however, given current liver function, will update ammonia level to rule out component of HE, last checked on 1/14, was 15 at that time.  Fecal impaction: present on admission, patient reported diarrhea prior to admission, likely overflow. Fecal disimpaction was attempted on Monday but unsuccessful. Started on Miralax BID and enema Monday which produced 2 large BMs, continue MiraLax BID. Patient reports 3-4 looser BMs this morning.  Plan: Continue to monitor H&H, and for overt GI bleeding EGD/Colonoscopy, likely as outpatient given multiple other metabolic derangements Continue to hold Plavix Trend HFP and INR daily Avoid hepatotoxic medications Continue miralax BID Check ammonia    LOS: 4 days    07/14/2021, 9:12 AM   Angelys Yetman L. Alver Sorrow, MSN, APRN, AGNP-C Adult-Gerontology Nurse Practitioner [facility]

## 2021-07-14 NOTE — Progress Notes (Signed)
PROGRESS NOTE   George Valdez, is a 66 y.o. male, DOB - 1955/10/29, TIR:[id number]  Admit date - 07/10/2021   Admitting Physician Bernadette Hoit, DO  Outpatient Primary MD for the patient is Patient, No Pcp Per (Inactive)  LOS - 4  Chief Complaint  Patient presents with   Fall    Generalized weakness        Brief Narrative:  George Valdez is a 66 y.o. male with medical history significant for hypertension, CAD/PAD, prior stroke with subsequent right-sided weakness, hyperlipidemia, chronic disease stage IIIB admitted on 07/10/2021 with recurrent falls and rhabdomyolysis as well as strep G sepsis  Assessment & Plan:   Principal Problem:   Bacterial infection/Bacteremia due to Streptococcus, group G Active Problems:   Acute kidney injury superimposed on CKD ([place])   Essential hypertension   CAD (coronary artery disease)   PAD (peripheral artery disease) ([place])   Anemia   Hypokalemia   AKI (acute kidney injury) ([place])   h/o CVA (cerebral vascular accident) ([place]) with left sided weakness   Frequent falls   Rhabdomyolysis   Generalized weakness   Failure to thrive in adult   Physical deconditioning   Transaminitis   Hypomagnesemia   Leukocytosis   Dehydration   Mixed hyperlipidemia   History of stroke   E. coli UTI (urinary tract infection)   Malnutrition of moderate degree   1)Streptococcal G Bacteremia Sepsis---- --WBC 22.8 >>18.4 >>14.7>>11.4 PCT 1.12 07/12/21---TTE---shows Thickening of distal anterior MV leaflet likely represents. calcification, cannot exclude small vegetation. Consider TEE if clinical suspicion of endocarditis or if repeat blood culture has remained positive.. -ID Physician Dr Gale Journey rec--Please repeat blood culture,. If positive, or if there are evidence of embolic phenomenom. Or if the initial cultures all bottles grow would consider TEE (currently only 1 of 2 initial blood cultures for strep G)--Per ID physician strep G bacteremia is probably  pathogenic and Not a contaminant -Okay to transition from Rocephin to Keflex on 07/14/2020 -Length of therapy to be determined based on results of repeat blood cultures.  2)Rhabdomyolysis in the setting of frequent falls--- CPK trending down -Continue to maintain adequate hydration.  3)E coli UTI---Okay to transition from Rocephin to Keflex on 07/14/2020. -Continue current oral antibiotics -Patient denies dysuria  4)Recurrent falls--- AM cortisol is not low, doubt orthostatic drops. -Patient with old/residual weakness due to prior strokes  CT abdomen and pelvis without acute intra-abdominal finding -Benzodiazepine may be contributing to recurrent falls PTA pt lived at home and did very poorly, patient has significant limitations with mobility related ADLs- this patient needs to continue to be monitored in the hospital until a SNF bed is obtained as he is not safe to go home with his current physcical limitations -PT eval appreciated recommends SNF rehab -Anticipate discharge to skilled nursing facility in the next 24 to 48 hours.  5)AKI----acute kidney injury on CKD stage -3B -Due to dehydration in the setting of recurrent falls and poor oral intake and rhabdomyolysis -Creatinine is down to 1.6 from 3.08 on admission, no recent baseline available --Continue to hydrate -Continue avoiding nephrotoxic agents, the use of contrast and hypotension.  6)CAD/PAD/history of strokes-----Patient with old/residual weakness due to prior strokes. -Currently more weak and deconditioned -Continue aspirin; continue to hold Plavix. -Endoscopy/colonoscopy by GI as an outpatient. -Continue holding the statins in the setting of acute transaminitis. -[name] when safe for secondary prevention. -No new deficits appreciated.  6)COPD- -continue bronchodilators -No wheezing currently.  7)Transaminitis--- most likely due to rhabdomyolysis, monitor closely -Acute  viral hepatitis panel negative for hep A, hep  B, and hep C -INR 1.2 -Continue holding statins for now.    8) hypomagnesemia/hyponatremia/hypokalemia--- dehydration and poor oral intake -Continue to follow electrolytes and further replete as needed.  9) acute on chronic iron deficiency anemia-- Hgb was down to 6.7  -2 units PRBC has been transfused; hemoglobin has remained stable since then. -Would benefit from EGD and colonoscopy (so far plan to be done as an outpatient). -GI consult appreciated -Continue Protonix for now -Follow results for stool occult blood test. -No overt bleeding noted--- however PTA patient was on aspirin and Plavix  Disposition/Need for in-Hospital Stay- patient unable to be discharged at this time due to --- rhabdomyolysis and AKI requiring IV fluids, streptococcal sepsis and E. coli UTI requiring IV antibiotics. -Possible discharge to SNF-- if Repeat Blood cx are Neg on 07/15/21  Status is: Inpatient  Remains inpatient appropriate because:   Disposition: The patient is from: Home              Anticipated d/c is to: SNF              Anticipated d/c date is: 1 day (if Repeat Blood cx are Neg on 07/15/21)              Patient currently is not medically stable to d/c. Barriers: Not Clinically Stable-   Code Status :  -  Code Status: DNR   Family Communication:   (patient is alert, awake and coherent)  Discussed with his wife  Consults  :  Gi/Palliative/ID  DVT Prophylaxis  :   - SCDs/?? GI bleed  Lab Results  Component Value Date   PLT 302 07/14/2021    Inpatient Medications  Scheduled Meds:  aspirin EC  81 mg Oral Daily   cephALEXin  500 mg Oral TID   feeding supplement  237 mL Oral QID   multivitamin with minerals  1 tablet Oral Daily   pantoprazole  40 mg Oral Daily   polyethylene glycol  17 g Oral BID   umeclidinium bromide  1 puff Inhalation Daily   Continuous Infusions:  dextrose 5 % and 0.9 % NaCl with KCl 20 mEq/L Stopped (07/13/21 1634)   PRN Meds:.albuterol,  ALPRAZolam   Anti-infectives (From admission, onward)    Start     Dose/Rate Route Frequency Ordered Stop   07/14/21 1000  cephALEXin (KEFLEX) capsule 500 mg        500 mg Oral 3 times daily 07/13/21 0920 07/17/21 0959   07/10/21 2100  cefTRIAXone (ROCEPHIN) 2 g in sodium chloride 0.9 % 100 mL IVPB        2 g 200 mL/hr over 30 Minutes Intravenous Every 24 hours 07/10/21 2045 07/13/21 2115         Subjective: George Valdez oriented x3 and following commands appropriately; no fever, no chest pain, no nausea, no vomiting.  Reports loose stools.  Objective: Vitals:   07/13/21 1926 07/14/21 0327 07/14/21 0710 07/14/21 1257  BP: (!) 132/114 (!) 157/77  92/66  Pulse: 98 (!) 105  98  Resp: 16 19  20   Temp: (!) 97.3 F (36.3 C) 97.8 F (36.6 C)  98.6 F (37 C)  TempSrc: Oral Oral    SpO2: 98% 96% 96% 94%  Weight:      Height:        Intake/Output Summary (Last 24 hours) at 07/14/2021 1746 Last data filed at 07/14/2021 1300 Gross per 24 hour  Intake 2151.09  ml  Output 1950 ml  Net 201.09 ml   Filed Weights   07/10/21 2047  Weight: 79.8 kg     Physical Exam General exam: Alert, awake, oriented x 3; chronically ill, weak and tired.  Reports loose stools.  No abdominal pain, no nausea, no vomiting. Respiratory system: Clear to auscultation. Respiratory effort normal.  No using accessory muscle.  Good saturation on room air. Cardiovascular system:RRR.  No rubs or gallops; no JVD. Gastrointestinal system: Abdomen is nondistended, soft and nontender. No organomegaly or masses felt. Normal bowel sounds heard. Central nervous system: No new focal deficit. Extremities: No cyanosis or clubbing. Skin: No petechiae; multiple scabs on chronic wounds in different healing stages affecting extremities bilaterally. Psychiatry: Judgement and insight appear normal. Mood & affect appropriate.   Data Reviewed: I have personally reviewed following labs and imaging  studies  CBC: Recent Labs  Lab 07/10/21 2048 07/11/21 0550 07/12/21 0523 07/13/21 0541 07/14/21 0655  WBC 22.8* 18.4* 14.7* 11.4* 10.5  NEUTROABS 20.9*  --   --   --   --   HGB 8.3* 6.7* 10.3* 9.1* 9.5*  HCT 26.6* 22.3* 32.3* 28.6* 30.0*  MCV 78.5* 79.4* 80.0 81.0 80.9  PLT 363 322 286 281 382   Basic Metabolic Panel: Recent Labs  Lab 07/10/21 2048 07/11/21 0550 07/12/21 0523 07/13/21 0541 07/14/21 0655  NA 125* 132* 139 139 143  K 2.2* 2.3* 2.4* 3.3* 3.4*  CL 96* 106 113* 115* 117*  CO2 17* 16* 17* 17* 17*  GLUCOSE 95 102* 111* 100* 89  BUN 39* 33* 31* 21 17  CREATININE 3.08* 2.60* 2.33* 1.88* 1.60*  CALCIUM 8.2* 7.5* 7.8* 7.6* 8.0*  MG 1.6* 2.0 2.0  --   --   PHOS  --  3.5  --   --   --    GFR: Estimated Creatinine Clearance: 42.5 mL/min (A) (by C-G formula based on SCr of 1.6 mg/dL (H)).  Liver Function Tests: Recent Labs  Lab 07/10/21 2048 07/11/21 0550 07/12/21 0523 07/13/21 0541 07/14/21 0655  AST 166* 160* 177* 194* 251*  ALT 161* 135* 138* 151* 221*  ALKPHOS 83 71 71 69 72  BILITOT 0.6 0.4 0.6 0.8 0.5  PROT 6.4* 5.5* 5.9* 5.2* 5.6*  ALBUMIN 2.4* 2.0* 2.0* 1.7* 1.9*   Recent Labs  Lab 07/10/21 2112 07/14/21 1135  AMMONIA 15 12   Coagulation Profile: Recent Labs  Lab 07/10/21 2048 07/12/21 1247 07/13/21 0541  INR 1.1 1.2 1.2   Cardiac Enzymes: Recent Labs  Lab 07/10/21 2048 07/11/21 0550 07/14/21 0655  CKTOTAL 3,313* 3,797* 1,665*   CBG: Recent Labs  Lab 07/10/21 2048  GLUCAP 95   Urine analysis:    Component Value Date/Time   COLORURINE YELLOW 07/10/2021 2146   APPEARANCEUR CLEAR 07/10/2021 2146   LABSPEC 1.015 07/10/2021 2146   PHURINE 6.0 07/10/2021 2146   GLUCOSEU NEGATIVE 07/10/2021 2146   HGBUR LARGE (A) 07/10/2021 2146   BILIRUBINUR NEGATIVE 07/10/2021 2146   [place] 07/10/2021 2146   PROTEINUR 100 (A) 07/10/2021 2146   UROBILINOGEN 0.2 09/10/2014 0908   NITRITE NEGATIVE 07/10/2021 2146    LEUKOCYTESUR SMALL (A) 07/10/2021 2146   Sepsis Labs:  Recent Results (from the past 240 hour(s))  Blood Culture (routine x 2)     Status: Abnormal   Collection Time: 07/10/21  9:13 PM   Specimen: BLOOD RIGHT ARM  Result Value Ref Range Status   Specimen Description   Final    BLOOD RIGHT ARM  BOTTLES DRAWN AEROBIC ONLY Performed at [facility], [address], [place], [place] [address]    Special Requests   Final    Blood Culture adequate volume Performed at [facility], [address], [place], [place] [address]    Culture  Setup Time   Final    GRAM POSITIVE COCCI Gram Stain Report Called to,Read Back By and Verified With: Mellody Drown @ 1231 07/11/21 BY STEPHTR AEROBIC BOTTLE ONLY Organism ID to follow CRITICAL RESULT CALLED TO, READ BACK BY AND VERIFIED WITHMellody Drown RN [phone] 07/11/21 A BROWNING Performed at [facility], [place] [address] [place], [place] [address]    Culture STREPTOCOCCUS GROUP G (A)  Final   Report Status 07/13/2021 FINAL  Final   Organism ID, Bacteria STREPTOCOCCUS GROUP G  Final      Susceptibility   Streptococcus group g - MIC*    CLINDAMYCIN <=0.25 SENSITIVE Sensitive     AMPICILLIN <=0.25 SENSITIVE Sensitive     ERYTHROMYCIN <=0.12 SENSITIVE Sensitive     VANCOMYCIN 0.5 SENSITIVE Sensitive     CEFTRIAXONE <=0.12 SENSITIVE Sensitive     LEVOFLOXACIN 0.5 SENSITIVE Sensitive     * STREPTOCOCCUS GROUP G  Blood Culture ID Panel (Reflexed)     Status: Abnormal   Collection Time: 07/10/21  9:13 PM  Result Value Ref Range Status   Enterococcus faecalis NOT DETECTED NOT DETECTED Final   Enterococcus Faecium NOT DETECTED NOT DETECTED Final   Listeria monocytogenes NOT DETECTED NOT DETECTED Final   Staphylococcus species NOT DETECTED NOT DETECTED Final   Staphylococcus aureus (BCID) NOT DETECTED NOT DETECTED Final   Staphylococcus epidermidis NOT DETECTED NOT DETECTED Final   Staphylococcus lugdunensis NOT DETECTED NOT DETECTED Final    Streptococcus species DETECTED (A) NOT DETECTED Final    Comment: Not Enterococcus species, Streptococcus agalactiae, Streptococcus pyogenes, or Streptococcus pneumoniae. CRITICAL RESULT CALLED TO, READ BACK BY AND VERIFIED WITHMellody Drown RN [phone] 07/11/21 A BROWNING    Streptococcus agalactiae NOT DETECTED NOT DETECTED Final   Streptococcus pneumoniae NOT DETECTED NOT DETECTED Final   Streptococcus pyogenes NOT DETECTED NOT DETECTED Final   A.calcoaceticus-baumannii NOT DETECTED NOT DETECTED Final   Bacteroides fragilis NOT DETECTED NOT DETECTED Final   Enterobacterales NOT DETECTED NOT DETECTED Final   Enterobacter cloacae complex NOT DETECTED NOT DETECTED Final   Escherichia coli NOT DETECTED NOT DETECTED Final   Klebsiella aerogenes NOT DETECTED NOT DETECTED Final   Klebsiella oxytoca NOT DETECTED NOT DETECTED Final   Klebsiella pneumoniae NOT DETECTED NOT DETECTED Final   Proteus species NOT DETECTED NOT DETECTED Final   Salmonella species NOT DETECTED NOT DETECTED Final   Serratia marcescens NOT DETECTED NOT DETECTED Final   Haemophilus influenzae NOT DETECTED NOT DETECTED Final   Neisseria meningitidis NOT DETECTED NOT DETECTED Final   Pseudomonas aeruginosa NOT DETECTED NOT DETECTED Final   Stenotrophomonas maltophilia NOT DETECTED NOT DETECTED Final   Candida albicans NOT DETECTED NOT DETECTED Final   Candida auris NOT DETECTED NOT DETECTED Final   Candida glabrata NOT DETECTED NOT DETECTED Final   Candida krusei NOT DETECTED NOT DETECTED Final   Candida parapsilosis NOT DETECTED NOT DETECTED Final   Candida tropicalis NOT DETECTED NOT DETECTED Final   Cryptococcus neoformans/gattii NOT DETECTED NOT DETECTED Final    Comment: Performed at [facility], [address] [place], [place] [address]  Blood Culture (routine x 2)     Status: None (Preliminary result)   Collection Time: 07/10/21  9:24 PM   Specimen: BLOOD LEFT HAND  Result Value Ref Range Status   Specimen  Description BLOOD LEFT HAND BOTTLES DRAWN AEROBIC ONLY  Final   Special Requests Blood Culture adequate volume  Final   Culture   Final    NO GROWTH 4 DAYS Performed at [facility], [address], [place], [place] [address]    Report Status PENDING  Incomplete  Urine Culture     Status: Abnormal   Collection Time: 07/10/21  9:46 PM   Specimen: Urine, Clean Catch  Result Value Ref Range Status   Specimen Description   Final    URINE, CLEAN CATCH Performed at [facility], [address], [place], [place] [address]    Special Requests   Final    NONE Performed at [facility], [address] [place], [place] [address]    Culture >=100,000 COLONIES/mL ESCHERICHIA COLI (A)  Final   Report Status 07/13/2021 FINAL  Final   Organism ID, Bacteria ESCHERICHIA COLI (A)  Final      Susceptibility   Escherichia coli - MIC*    AMPICILLIN >=32 RESISTANT Resistant     CEFAZOLIN <=4 SENSITIVE Sensitive     CEFEPIME <=0.12 SENSITIVE Sensitive     CEFTRIAXONE <=0.25 SENSITIVE Sensitive     CIPROFLOXACIN <=0.25 SENSITIVE Sensitive     GENTAMICIN <=1 SENSITIVE Sensitive     IMIPENEM <=0.25 SENSITIVE Sensitive     NITROFURANTOIN <=16 SENSITIVE Sensitive     TRIMETH/SULFA <=20 SENSITIVE Sensitive     AMPICILLIN/SULBACTAM >=32 RESISTANT Resistant     PIP/TAZO <=4 SENSITIVE Sensitive     * >=100,000 COLONIES/mL ESCHERICHIA COLI  Resp Panel by RT-PCR (Flu A&B, Covid) Nasopharyngeal Swab     Status: None   Collection Time: 07/10/21  9:54 PM   Specimen: Nasopharyngeal Swab; Nasopharyngeal(NP) swabs in vial transport medium  Result Value Ref Range Status   SARS Coronavirus 2 by RT PCR NEGATIVE NEGATIVE Final    Comment: (NOTE) SARS-CoV-2 target nucleic acids are NOT DETECTED.  The SARS-CoV-2 RNA is generally detectable in upper respiratory specimens during the acute phase of infection. The lowest concentration of SARS-CoV-2 viral copies this assay can detect is 138 copies/mL. A negative result  does not preclude SARS-Cov-2 infection and should not be used as the sole basis for treatment or other patient management decisions. A negative result may occur with  improper specimen collection/handling, submission of specimen other than nasopharyngeal swab, presence of viral mutation(s) within the areas targeted by this assay, and inadequate number of viral copies(<138 copies/mL). A negative result must be combined with clinical observations, patient history, and epidemiological information. The expected result is Negative.  Fact Sheet for Patients:  EntrepreneurPulse.com.au  Fact Sheet for Healthcare Providers:  IncredibleEmployment.be  This test is no t yet approved or cleared by the [place] FDA and  has been authorized for detection and/or diagnosis of SARS-CoV-2 by FDA under an Emergency Use Authorization (EUA). This EUA will remain  in effect (meaning this test can be used) for the duration of the COVID-19 declaration under Section 564(b)(1) of the Act, 21 U.S.C.section 360bbb-3(b)(1), unless the authorization is terminated  or revoked sooner.       Influenza A by PCR NEGATIVE NEGATIVE Final   Influenza B by PCR NEGATIVE NEGATIVE Final    Comment: (NOTE) The Xpert Xpress SARS-CoV-2/FLU/RSV plus assay is intended as an aid in the diagnosis of influenza from Nasopharyngeal swab specimens and should not be used as a sole basis  for treatment. Nasal washings and aspirates are unacceptable for Xpert Xpress SARS-CoV-2/FLU/RSV testing.  Fact Sheet for Patients: EntrepreneurPulse.com.au  Fact Sheet for Healthcare Providers: IncredibleEmployment.be  This test is not yet approved or cleared by the [place] FDA and has been authorized for detection and/or diagnosis of SARS-CoV-2 by FDA under an Emergency Use Authorization (EUA). This EUA will remain in effect (meaning this test can be used) for  the duration of the COVID-19 declaration under Section 564(b)(1) of the Act, 21 U.S.C. section 360bbb-3(b)(1), unless the authorization is terminated or revoked.  Performed at [facility], [address] [place], [place] [address]      Radiology Studies: No results found.   Scheduled Meds:  aspirin EC  81 mg Oral Daily   cephALEXin  500 mg Oral TID   feeding supplement  237 mL Oral QID   multivitamin with minerals  1 tablet Oral Daily   pantoprazole  40 mg Oral Daily   polyethylene glycol  17 g Oral BID   umeclidinium bromide  1 puff Inhalation Daily   Continuous Infusions:  dextrose 5 % and 0.9 % NaCl with KCl 20 mEq/L Stopped (07/13/21 1634)     LOS: 4 days    Barton Dubois M.D on 07/14/2021 at 5:46 PM  Go to [email or web address] - for contact info  [facility] - Office  [phone]  If 7PM-7AM, please contact night-coverage [email or web address] Password [facility] 07/14/2021, 5:46 PM

## 2021-07-15 DIAGNOSIS — K5641 Fecal impaction: Secondary | ICD-10-CM

## 2021-07-15 DIAGNOSIS — R7989 Other specified abnormal findings of blood chemistry: Secondary | ICD-10-CM

## 2021-07-15 LAB — CULTURE, BLOOD (ROUTINE X 2)
Culture: NO GROWTH
Special Requests: ADEQUATE

## 2021-07-15 LAB — GLUCOSE, CAPILLARY: Glucose-Capillary: 96 mg/dL (ref 70–99)

## 2021-07-15 LAB — IGG: IgG (Immunoglobin G), Serum: 761 mg/dL (ref 603–1613)

## 2021-07-15 LAB — ANA: Anti Nuclear Antibody (ANA): NEGATIVE

## 2021-07-15 LAB — ANTI-SMOOTH MUSCLE ANTIBODY, IGG: F-Actin IgG: 9 Units (ref 0–19)

## 2021-07-15 NOTE — Progress Notes (Signed)
PROGRESS NOTE   George Valdez, is a 66 y.o. male, DOB - 14-Oct-1955, YKZ:[id number]  Admit date - 07/10/2021   Admitting Physician Bernadette Hoit, DO  Outpatient Primary MD for the patient is Patient, No Pcp Per (Inactive)  LOS - 5  Chief Complaint  Patient presents with   Fall    Generalized weakness        Brief Narrative:  George Valdez is a 66 y.o. male with medical history significant for hypertension, CAD/PAD, prior stroke with subsequent right-sided weakness, hyperlipidemia, chronic disease stage IIIB admitted on 07/10/2021 with recurrent falls and rhabdomyolysis as well as strep G sepsis  Assessment & Plan:   Principal Problem:   Bacterial infection/Bacteremia due to Streptococcus, group G Active Problems:   Acute kidney injury superimposed on CKD ([place])   Essential hypertension   CAD (coronary artery disease)   PAD (peripheral artery disease) ([place])   Anemia   Hypokalemia   AKI (acute kidney injury) ([place])   h/o CVA (cerebral vascular accident) ([place]) with left sided weakness   Frequent falls   Rhabdomyolysis   Generalized weakness   Failure to thrive in adult   Physical deconditioning   Transaminitis   Hypomagnesemia   Leukocytosis   Dehydration   Mixed hyperlipidemia   History of stroke   E. coli UTI (urinary tract infection)   Malnutrition of moderate degree   1)Streptococcal G Bacteremia Sepsis---- --WBC 22.8 >>18.4 >>14.7>>11.4 PCT 1.12 07/12/21---TTE---shows Thickening of distal anterior MV leaflet likely represents. calcification, cannot exclude small vegetation. Consider TEE if clinical suspicion of endocarditis or if repeat blood culture has remained positive.. -ID Physician Dr Gale Journey rec--Please repeat blood culture,. If positive, or if there are evidence of embolic phenomenom. Or if the initial cultures all bottles grow would consider TEE (currently only 1 of 2 initial blood cultures for strep G)--Per ID physician strep G bacteremia is probably  pathogenic and Not a contaminant -Okay to transition from Rocephin to Keflex on 07/14/2020 -Length of therapy to be determined based on results of repeat blood cultures.  2)Rhabdomyolysis in the setting of frequent falls--- CPK trending down -Continue to maintain adequate hydration.  3)E coli UTI---Okay to transition from Rocephin to Keflex on 07/14/2020. -Continue current oral antibiotics -Patient denies dysuria  4)Recurrent falls--- AM cortisol is not low, doubt orthostatic drops. -Patient with old/residual weakness due to prior strokes  CT abdomen and pelvis without acute intra-abdominal finding -Benzodiazepine may be contributing to recurrent falls PTA pt lived at home and did very poorly, patient has significant limitations with mobility related ADLs- this patient needs to continue to be monitored in the hospital until a SNF bed is obtained as he is not safe to go home with his current physcical limitations -PT eval appreciated recommends SNF rehab -Anticipate discharge to skilled nursing facility in the next 24 to 48 hours.  5)AKI----acute kidney injury on CKD stage -3B -Due to dehydration in the setting of recurrent falls and poor oral intake and rhabdomyolysis -Creatinine is down to 1.6 from 3.08 on admission, no recent baseline available --Continue to hydrate -Continue avoiding nephrotoxic agents, the use of contrast and hypotension.  6)CAD/PAD/history of strokes-----Patient with old/residual weakness due to prior strokes. -Currently more weak and deconditioned -Continue aspirin; continue to hold Plavix. -Endoscopy/colonoscopy by GI as an outpatient. -Continue holding the statins in the setting of acute transaminitis. -[name] when safe for secondary prevention. -No new deficits appreciated.  6)COPD- -continue bronchodilators -No wheezing currently.  7)Transaminitis--- most likely due to rhabdomyolysis, monitor closely -Acute  viral hepatitis panel negative for hep A, hep  B, and hep C -INR 1.2 -Continue holding statins for now.    8) hypomagnesemia/hyponatremia/hypokalemia--- dehydration and poor oral intake -Continue to follow electrolytes and further replete as needed.  9) acute on chronic iron deficiency anemia-- Hgb was down to 6.7  -2 units PRBC has been transfused; hemoglobin has remained stable since then. -Would benefit from EGD and colonoscopy (so far plan to be done as an outpatient). -GI consult appreciated -Continue Protonix for now -Follow results for stool occult blood test. -No overt bleeding noted--- however PTA patient was on aspirin and Plavix  Disposition/Need for in-Hospital Stay- patient unable to be discharged at this time due to --- rhabdomyolysis and AKI requiring IV fluids, streptococcal sepsis and E. coli UTI requiring IV antibiotics. -Possible discharge to SNF-- if Repeat Blood cx are Neg on 07/16/21  Status is: Inpatient  Remains inpatient appropriate because:   Disposition: The patient is from: Home              Anticipated d/c is to: SNF              Anticipated d/c date is: 1 day (if Repeat Blood cx are Neg on 07/16/21)              Patient currently is not medically stable to d/c. Barriers: Not Clinically Stable-   Code Status :  -  Code Status: DNR   Family Communication:   (patient is alert, awake and coherent)  Discussed with his wife  Consults  :  Gi/Palliative/ID  DVT Prophylaxis  :   - SCDs/?? GI bleed  Lab Results  Component Value Date   PLT 302 07/14/2021    Inpatient Medications  Scheduled Meds:  aspirin EC  81 mg Oral Daily   cephALEXin  500 mg Oral TID   feeding supplement  237 mL Oral QID   multivitamin with minerals  1 tablet Oral Daily   pantoprazole  40 mg Oral Daily   polyethylene glycol  17 g Oral BID   umeclidinium bromide  1 puff Inhalation Daily   Continuous Infusions:  dextrose 5 % and 0.9 % NaCl with KCl 20 mEq/L Stopped (07/13/21 1634)   PRN Meds:.albuterol,  ALPRAZolam   Anti-infectives (From admission, onward)    Start     Dose/Rate Route Frequency Ordered Stop   07/14/21 1000  cephALEXin (KEFLEX) capsule 500 mg        500 mg Oral 3 times daily 07/13/21 0920 07/17/21 0959   07/10/21 2100  cefTRIAXone (ROCEPHIN) 2 g in sodium chloride 0.9 % 100 mL IVPB        2 g 200 mL/hr over 30 Minutes Intravenous Every 24 hours 07/10/21 2045 07/13/21 2115         Subjective: George Valdez oriented x3, no chest pain, no nausea, no vomiting.  Patient denies overt bleeding.  Feeling weak, tired and deconditioned.  No requiring oxygen supplementation.  Objective: Vitals:   07/14/21 2027 07/15/21 0438 07/15/21 0756 07/15/21 1302  BP: 104/62 100/69  (!) 82/71  Pulse: (!) 102 98  61  Resp: 19 19  17   Temp: 98 F (36.7 C) 98 F (36.7 C)  98.2 F (36.8 C)  TempSrc:      SpO2: 98% 100% 99% 90%  Weight:      Height:        Intake/Output Summary (Last 24 hours) at 07/15/2021 1530 Last data filed at 07/15/2021 1300 Gross per 24  hour  Intake 960 ml  Output 1100 ml  Net -140 ml   Filed Weights   07/10/21 2047  Weight: 79.8 kg     Physical Exam General exam: Alert, awake, oriented x 3; chronically ill in appearance, weak and deconditioned.  Reports no chest pain, palpitations or shortness of breath.  No overt bleeding. Respiratory system: Clear to auscultation. Respiratory effort normal.  No requiring oxygen supplementation. Cardiovascular system:RRR. No rubs or gallops.  No JVD. Gastrointestinal system: Abdomen is nondistended, soft and nontender. No organomegaly or masses felt. Normal bowel sounds heard. Central nervous system: No focal neurological deficits. Extremities: No cyanosis or clubbing. Skin: No petechiae. Psychiatry: Judgement and insight appear normal. Mood & affect appropriate.   Data Reviewed: I have personally reviewed following labs and imaging studies  CBC: Recent Labs  Lab 07/10/21 2048 07/11/21 0550  07/12/21 0523 07/13/21 0541 07/14/21 0655  WBC 22.8* 18.4* 14.7* 11.4* 10.5  NEUTROABS 20.9*  --   --   --   --   HGB 8.3* 6.7* 10.3* 9.1* 9.5*  HCT 26.6* 22.3* 32.3* 28.6* 30.0*  MCV 78.5* 79.4* 80.0 81.0 80.9  PLT 363 322 286 281 096   Basic Metabolic Panel: Recent Labs  Lab 07/10/21 2048 07/11/21 0550 07/12/21 0523 07/13/21 0541 07/14/21 0655  NA 125* 132* 139 139 143  K 2.2* 2.3* 2.4* 3.3* 3.4*  CL 96* 106 113* 115* 117*  CO2 17* 16* 17* 17* 17*  GLUCOSE 95 102* 111* 100* 89  BUN 39* 33* 31* 21 17  CREATININE 3.08* 2.60* 2.33* 1.88* 1.60*  CALCIUM 8.2* 7.5* 7.8* 7.6* 8.0*  MG 1.6* 2.0 2.0  --   --   PHOS  --  3.5  --   --   --    GFR: Estimated Creatinine Clearance: 42.5 mL/min (A) (by C-G formula based on SCr of 1.6 mg/dL (H)).  Liver Function Tests: Recent Labs  Lab 07/10/21 2048 07/11/21 0550 07/12/21 0523 07/13/21 0541 07/14/21 0655  AST 166* 160* 177* 194* 251*  ALT 161* 135* 138* 151* 221*  ALKPHOS 83 71 71 69 72  BILITOT 0.6 0.4 0.6 0.8 0.5  PROT 6.4* 5.5* 5.9* 5.2* 5.6*  ALBUMIN 2.4* 2.0* 2.0* 1.7* 1.9*   Recent Labs  Lab 07/10/21 2112 07/14/21 1135  AMMONIA 15 12   Coagulation Profile: Recent Labs  Lab 07/10/21 2048 07/12/21 1247 07/13/21 0541  INR 1.1 1.2 1.2   Cardiac Enzymes: Recent Labs  Lab 07/10/21 2048 07/11/21 0550 07/14/21 0655  CKTOTAL 3,313* 3,797* 1,665*   CBG: Recent Labs  Lab 07/10/21 2048  GLUCAP 95   Urine analysis:    Component Value Date/Time   COLORURINE YELLOW 07/10/2021 2146   APPEARANCEUR CLEAR 07/10/2021 2146   LABSPEC 1.015 07/10/2021 2146   PHURINE 6.0 07/10/2021 2146   GLUCOSEU NEGATIVE 07/10/2021 2146   HGBUR LARGE (A) 07/10/2021 2146   BILIRUBINUR NEGATIVE 07/10/2021 2146   [place] 07/10/2021 2146   PROTEINUR 100 (A) 07/10/2021 2146   UROBILINOGEN 0.2 09/10/2014 0908   NITRITE NEGATIVE 07/10/2021 2146   LEUKOCYTESUR SMALL (A) 07/10/2021 2146   Sepsis Labs:  Recent Results  (from the past 240 hour(s))  Blood Culture (routine x 2)     Status: Abnormal   Collection Time: 07/10/21  9:13 PM   Specimen: BLOOD RIGHT ARM  Result Value Ref Range Status   Specimen Description   Final    BLOOD RIGHT ARM BOTTLES DRAWN AEROBIC ONLY Performed at [facility], [address] [place], [place] [address]    Special Requests   Final    Blood Culture adequate volume Performed at [facility], [address], [place], [place] [address]    Culture  Setup Time   Final    GRAM POSITIVE COCCI Gram Stain Report Called to,Read Back By and Verified With: Mellody Drown @ 1231 07/11/21 BY STEPHTR AEROBIC BOTTLE ONLY Organism ID to follow CRITICAL RESULT CALLED TO, READ BACK BY AND VERIFIED WITHMellody Drown RN [phone] 07/11/21 A BROWNING Performed at [facility], [place] [address], [place], [place] [address]    Culture STREPTOCOCCUS GROUP G (A)  Final   Report Status 07/13/2021 FINAL  Final   Organism ID, Bacteria STREPTOCOCCUS GROUP G  Final      Susceptibility   Streptococcus group g - MIC*    CLINDAMYCIN <=0.25 SENSITIVE Sensitive     AMPICILLIN <=0.25 SENSITIVE Sensitive     ERYTHROMYCIN <=0.12 SENSITIVE Sensitive     VANCOMYCIN 0.5 SENSITIVE Sensitive     CEFTRIAXONE <=0.12 SENSITIVE Sensitive     LEVOFLOXACIN 0.5 SENSITIVE Sensitive     * STREPTOCOCCUS GROUP G  Blood Culture ID Panel (Reflexed)     Status: Abnormal   Collection Time: 07/10/21  9:13 PM  Result Value Ref Range Status   Enterococcus faecalis NOT DETECTED NOT DETECTED Final   Enterococcus Faecium NOT DETECTED NOT DETECTED Final   Listeria monocytogenes NOT DETECTED NOT DETECTED Final   Staphylococcus species NOT DETECTED NOT DETECTED Final   Staphylococcus aureus (BCID) NOT DETECTED NOT DETECTED Final   Staphylococcus epidermidis NOT DETECTED NOT DETECTED Final   Staphylococcus lugdunensis NOT DETECTED NOT DETECTED Final   Streptococcus species DETECTED (A) NOT DETECTED Final    Comment: Not Enterococcus  species, Streptococcus agalactiae, Streptococcus pyogenes, or Streptococcus pneumoniae. CRITICAL RESULT CALLED TO, READ BACK BY AND VERIFIED WITHMellody Drown RN [phone] 07/11/21 A BROWNING    Streptococcus agalactiae NOT DETECTED NOT DETECTED Final   Streptococcus pneumoniae NOT DETECTED NOT DETECTED Final   Streptococcus pyogenes NOT DETECTED NOT DETECTED Final   A.calcoaceticus-baumannii NOT DETECTED NOT DETECTED Final   Bacteroides fragilis NOT DETECTED NOT DETECTED Final   Enterobacterales NOT DETECTED NOT DETECTED Final   Enterobacter cloacae complex NOT DETECTED NOT DETECTED Final   Escherichia coli NOT DETECTED NOT DETECTED Final   Klebsiella aerogenes NOT DETECTED NOT DETECTED Final   Klebsiella oxytoca NOT DETECTED NOT DETECTED Final   Klebsiella pneumoniae NOT DETECTED NOT DETECTED Final   Proteus species NOT DETECTED NOT DETECTED Final   Salmonella species NOT DETECTED NOT DETECTED Final   Serratia marcescens NOT DETECTED NOT DETECTED Final   Haemophilus influenzae NOT DETECTED NOT DETECTED Final   Neisseria meningitidis NOT DETECTED NOT DETECTED Final   Pseudomonas aeruginosa NOT DETECTED NOT DETECTED Final   Stenotrophomonas maltophilia NOT DETECTED NOT DETECTED Final   Candida albicans NOT DETECTED NOT DETECTED Final   Candida auris NOT DETECTED NOT DETECTED Final   Candida glabrata NOT DETECTED NOT DETECTED Final   Candida krusei NOT DETECTED NOT DETECTED Final   Candida parapsilosis NOT DETECTED NOT DETECTED Final   Candida tropicalis NOT DETECTED NOT DETECTED Final   Cryptococcus neoformans/gattii NOT DETECTED NOT DETECTED Final    Comment: Performed at [facility], [address] [place], [place] [address]  Blood Culture (routine x 2)     Status: None   Collection Time: 07/10/21  9:24 PM   Specimen: BLOOD LEFT HAND  Result Value Ref  Range Status   Specimen Description BLOOD LEFT HAND BOTTLES DRAWN AEROBIC ONLY  Final   Special Requests Blood Culture adequate  volume  Final   Culture   Final    NO GROWTH 5 DAYS Performed at [facility], [address], [place], [place] [address]    Report Status 07/15/2021 FINAL  Final  Urine Culture     Status: Abnormal   Collection Time: 07/10/21  9:46 PM   Specimen: Urine, Clean Catch  Result Value Ref Range Status   Specimen Description   Final    URINE, CLEAN CATCH Performed at [facility], [address], [place], [place] [address]    Special Requests   Final    NONE Performed at [facility], [address] [place], [place] [address]    Culture >=100,000 COLONIES/mL ESCHERICHIA COLI (A)  Final   Report Status 07/13/2021 FINAL  Final   Organism ID, Bacteria ESCHERICHIA COLI (A)  Final      Susceptibility   Escherichia coli - MIC*    AMPICILLIN >=32 RESISTANT Resistant     CEFAZOLIN <=4 SENSITIVE Sensitive     CEFEPIME <=0.12 SENSITIVE Sensitive     CEFTRIAXONE <=0.25 SENSITIVE Sensitive     CIPROFLOXACIN <=0.25 SENSITIVE Sensitive     GENTAMICIN <=1 SENSITIVE Sensitive     IMIPENEM <=0.25 SENSITIVE Sensitive     NITROFURANTOIN <=16 SENSITIVE Sensitive     TRIMETH/SULFA <=20 SENSITIVE Sensitive     AMPICILLIN/SULBACTAM >=32 RESISTANT Resistant     PIP/TAZO <=4 SENSITIVE Sensitive     * >=100,000 COLONIES/mL ESCHERICHIA COLI  Resp Panel by RT-PCR (Flu A&B, Covid) Nasopharyngeal Swab     Status: None   Collection Time: 07/10/21  9:54 PM   Specimen: Nasopharyngeal Swab; Nasopharyngeal(NP) swabs in vial transport medium  Result Value Ref Range Status   SARS Coronavirus 2 by RT PCR NEGATIVE NEGATIVE Final    Comment: (NOTE) SARS-CoV-2 target nucleic acids are NOT DETECTED.  The SARS-CoV-2 RNA is generally detectable in upper respiratory specimens during the acute phase of infection. The lowest concentration of SARS-CoV-2 viral copies this assay can detect is 138 copies/mL. A negative result does not preclude SARS-Cov-2 infection and should not be used as the sole basis for treatment or other  patient management decisions. A negative result may occur with  improper specimen collection/handling, submission of specimen other than nasopharyngeal swab, presence of viral mutation(s) within the areas targeted by this assay, and inadequate number of viral copies(<138 copies/mL). A negative result must be combined with clinical observations, patient history, and epidemiological information. The expected result is Negative.  Fact Sheet for Patients:  EntrepreneurPulse.com.au  Fact Sheet for Healthcare Providers:  IncredibleEmployment.be  This test is no t yet approved or cleared by the [place] FDA and  has been authorized for detection and/or diagnosis of SARS-CoV-2 by FDA under an Emergency Use Authorization (EUA). This EUA will remain  in effect (meaning this test can be used) for the duration of the COVID-19 declaration under Section 564(b)(1) of the Act, 21 U.S.C.section 360bbb-3(b)(1), unless the authorization is terminated  or revoked sooner.       Influenza A by PCR NEGATIVE NEGATIVE Final   Influenza B by PCR NEGATIVE NEGATIVE Final    Comment: (NOTE) The Xpert Xpress SARS-CoV-2/FLU/RSV plus assay is intended as an aid in the diagnosis of influenza from Nasopharyngeal swab specimens and should not be used as a sole basis for treatment. Nasal washings and aspirates are unacceptable for Xpert Xpress  SARS-CoV-2/FLU/RSV testing.  Fact Sheet for Patients: EntrepreneurPulse.com.au  Fact Sheet for Healthcare Providers: IncredibleEmployment.be  This test is not yet approved or cleared by the [place] FDA and has been authorized for detection and/or diagnosis of SARS-CoV-2 by FDA under an Emergency Use Authorization (EUA). This EUA will remain in effect (meaning this test can be used) for the duration of the COVID-19 declaration under Section 564(b)(1) of the Act, 21 U.S.C. section  360bbb-3(b)(1), unless the authorization is terminated or revoked.  Performed at [facility], [address], [place], [place] [address]   Culture, blood (routine x 2)     Status: None (Preliminary result)   Collection Time: 07/14/21  6:09 PM   Specimen: BLOOD LEFT ARM  Result Value Ref Range Status   Specimen Description BLOOD LEFT ARM  Final   Special Requests   Final    BOTTLES DRAWN AEROBIC AND ANAEROBIC Blood Culture adequate volume   Culture   Final    NO GROWTH < 24 HOURS Performed at [facility], [address] [place], [place] [address]    Report Status PENDING  Incomplete  Culture, blood (routine x 2)     Status: None (Preliminary result)   Collection Time: 07/14/21  6:09 PM   Specimen: BLOOD RIGHT ARM  Result Value Ref Range Status   Specimen Description BLOOD RIGHT ARM  Final   Special Requests   Final    BOTTLES DRAWN AEROBIC AND ANAEROBIC Blood Culture adequate volume   Culture   Final    NO GROWTH < 24 HOURS Performed at [facility], [address] [place], [place] [address]    Report Status PENDING  Incomplete     Radiology Studies: No results found.   Scheduled Meds:  aspirin EC  81 mg Oral Daily   cephALEXin  500 mg Oral TID   feeding supplement  237 mL Oral QID   multivitamin with minerals  1 tablet Oral Daily   pantoprazole  40 mg Oral Daily   polyethylene glycol  17 g Oral BID   umeclidinium bromide  1 puff Inhalation Daily   Continuous Infusions:  dextrose 5 % and 0.9 % NaCl with KCl 20 mEq/L Stopped (07/13/21 1634)     LOS: 5 days    Barton Dubois M.D on 07/15/2021 at 3:30 PM  Go to [email or web address] - for contact info  [facility] - Office  [phone]  If 7PM-7AM, please contact night-coverage [email or web address] Password [email or web address] 07/15/2021, 3:30 PM

## 2021-07-15 NOTE — Progress Notes (Signed)
Subjective:  No complaints. Denies pain, nausea. States his bowels are moving good.   Objective: Vital signs in last 24 hours: Temp:  [98 F (36.7 C)-98.2 F (36.8 C)] 98.2 F (36.8 C) (01/19 1302) Pulse Rate:  [61-102] 61 (01/19 1302) Resp:  [17-19] 17 (01/19 1302) BP: (82-104)/(62-71) 82/71 (01/19 1302) SpO2:  [90 %-100 %] 90 % (01/19 1302) Last BM Date: 07/15/21 General:   Alert,  chronically ill appearing. cooperative in NAD Head:  Normocephalic and atraumatic. Eyes:  Sclera clear, no icterus.  Chest: CTA bilaterally without rales, rhonchi, crackles.    Heart:  Regular rate and rhythm; no murmurs, clicks, rubs,  or gallops. Abdomen:  Soft, nontender and nondistended.  Normal bowel sounds, without guarding, and without rebound.   Extremities:  Without clubbing, deformity or edema. Neurologic:  Alert and  oriented x4;  grossly normal neurologically. Skin:  Intact without significant lesions or rashes. Psych:  Alert and cooperative. Normal mood and affect.  Intake/Output from previous day: 01/18 0701 - 01/19 0700 In: 1080 [P.O.:1080] Out: 1500 [Urine:1500] Intake/Output this shift: Total I/O In: 1200 [P.O.:1200] Out: 950 [Urine:950]  Lab Results: CBC Recent Labs    07/13/21 0541 07/14/21 0655  WBC 11.4* 10.5  HGB 9.1* 9.5*  HCT 28.6* 30.0*  MCV 81.0 80.9  PLT 281 302   BMET Recent Labs    07/13/21 0541 07/14/21 0655  NA 139 143  K 3.3* 3.4*  CL 115* 117*  CO2 17* 17*  GLUCOSE 100* 89  BUN 21 17  CREATININE 1.88* 1.60*  CALCIUM 7.6* 8.0*   LFTs Recent Labs    07/13/21 0541 07/14/21 0655  BILITOT 0.8 0.5  ALKPHOS 69 72  AST 194* 251*  ALT 151* 221*  PROT 5.2* 5.6*  ALBUMIN 1.7* 1.9*   No results for input(s): LIPASE in the last 72 hours. PT/INR Recent Labs    07/13/21 0541  LABPROT 15.1  INR 1.2      Imaging Studies: CT ABDOMEN PELVIS WO CONTRAST  Result Date: 07/10/2021 CLINICAL DATA:  [place], generalized weakness EXAM: CT ABDOMEN AND  PELVIS WITHOUT CONTRAST TECHNIQUE: Multidetector CT imaging of the abdomen and pelvis was performed following the standard protocol without IV contrast. Unenhanced CT was performed per clinician order. Lack of IV contrast limits sensitivity and specificity, especially for evaluation of abdominal/pelvic solid viscera. RADIATION DOSE REDUCTION: This exam was performed according to the departmental dose-optimization program which includes automated exposure control, adjustment of the mA and/or kV according to patient size and/or use of iterative reconstruction technique. COMPARISON:  None. FINDINGS: Lower chest: Bibasilar scarring and fibrosis. No acute pleural or parenchymal lung disease. Hepatobiliary: Unremarkable unenhanced appearance of the liver and gallbladder. Pancreas: Unremarkable unenhanced appearance. Spleen: Unremarkable unenhanced appearance. Adrenals/Urinary Tract: Low-attenuation nodular thickening left adrenal gland consistent with hyperplasia or adenoma. Right adrenals unremarkable. No urinary tract calculi or obstructive uropathy within either kidney. Vascular calcifications are seen at the renal hila. The bladder is unremarkable. Stomach/Bowel: No bowel obstruction or ileus. Large amount of retained stool within the rectal vault may reflect fecal impaction. Normal appendix right lower quadrant. No bowel wall thickening or inflammatory change. Vascular/Lymphatic: Diffuse aortic atherosclerosis. Evaluation of the vascular lumen limited by the lack of IV contrast. No pathologic adenopathy. Reproductive: Prostate is not enlarged. Testicles are retracted into the distal inguinal canals bilaterally. Other: No free intraperitoneal fluid or free gas. No abdominal wall hernia. Musculoskeletal: Age-indeterminate fracture of the right fourth costochondral junction, only partially visualized on this study. Chronic  right posterolateral eleventh rib fracture with moderate callus formation. No other acute bony  abnormalities. Subcutaneous fat stranding in the right gluteal region may reflect direct trauma and ecchymosis. No evidence of hematoma. Reconstructed images demonstrate no additional findings. IMPRESSION: 1. Subcutaneous fat stranding right gluteal region which may reflect direct trauma and ecchymosis. 2. Age indeterminate fracture of the right fourth costochondral junction, only partially visualized on this study. Chronic right posterolateral eleventh rib fracture. 3. Otherwise no acute intra-abdominal crash that marked retained stool within the distal colon, compatible with fecal impaction. No bowel obstruction or ileus. 4.  Aortic Atherosclerosis (ICD10-I70.0). Electronically Signed   By: Randa Ngo M.D.   On: 07/10/2021 22:37   CT Head Wo Contrast  Result Date: 07/10/2021 CLINICAL DATA:  Recent fall with weakness, initial encounter EXAM: CT HEAD WITHOUT CONTRAST TECHNIQUE: Contiguous axial images were obtained from the base of the skull through the vertex without intravenous contrast. RADIATION DOSE REDUCTION: This exam was performed according to the departmental dose-optimization program which includes automated exposure control, adjustment of the mA and/or kV according to patient size and/or use of iterative reconstruction technique. COMPARISON:  08/22/2016 FINDINGS: Brain: No evidence of acute infarction, hemorrhage, hydrocephalus, extra-axial collection or mass lesion/mass effect. 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 Scattered lacunar infarcts in the basal ganglia bilaterally are again noted. Vascular: No hyperdense vessel or unexpected calcification. Skull: Normal. Negative for fracture or focal lesion. Sinuses/Orbits: No acute finding. Other: None. IMPRESSION: Chronic  ischemic changes without acute abnormality. Electronically Signed   By: [name] M.D.   On: 07/10/2021 21:10   DG Chest Port 1 View  Result Date:  07/10/2021 CLINICAL DATA:  cough, fall EXAM: PORTABLE CHEST 1 VIEW COMPARISON:  Chest x-ray 08/22/2016 FINDINGS: The heart and mediastinal contours are unchanged. Aortic calcification No focal consolidation. No pulmonary edema. No pleural effusion. No pneumothorax. No acute osseous abnormality. IMPRESSION: No active disease. Electronically Signed   By: Iven Finn M.D.   On: 07/10/2021 21:11   ECHOCARDIOGRAM COMPLETE  Result Date: 07/12/2021    ECHOCARDIOGRAM REPORT   Patient Name:   JAGGAR BENKO Virgil Date of Exam: 07/12/2021 Medical Rec #:  [id number]              Height:       63.0 in Accession #:    [id number]             Weight:       175.9 lb Date of Birth:  [birth year]              BSA:          1.831 m Patient Age:    34 years               BP:           119/59 mmHg Patient Gender: M                      HR:           99 bpm. Exam Location:  [facility] Procedure: 2D Echo, Cardiac Doppler and Color Doppler Indications:    Bacteremia  History:        Patient has prior history of Echocardiogram examinations, most                 recent 12/04/2017. CAD, PAD and Stroke; Risk  Factors:Hypertension.  Sonographer:    Wenda Low Referring Phys: [place]  1. Left ventricular ejection fraction, by estimation, is 55 to 60%. The left ventricle has normal function. The left ventricle has no regional wall motion abnormalities. Left ventricular diastolic parameters are consistent with Grade I diastolic dysfunction (impaired relaxation).  2. Right ventricular systolic function is normal. The right ventricular size is normal. Tricuspid regurgitation signal is inadequate for assessing PA pressure.  3. Thickening of distal anterior MV leaflet likely represents calcification, cannot exclude small vegetation. Consider TEE if clinical suspicion of endocarditis. . The mitral valve is abnormal. Mild mitral valve regurgitation. No evidence of mitral stenosis.  4. The aortic valve  is tricuspid. There is moderate calcification of the aortic valve. There is moderate thickening of the aortic valve. Aortic valve regurgitation is not visualized. No aortic stenosis is present.  5. The inferior vena cava is normal in size with greater than 50% respiratory variability, suggesting right atrial pressure of 3 mmHg. FINDINGS  Left Ventricle: Left ventricular ejection fraction, by estimation, is 55 to 60%. The left ventricle has normal function. The left ventricle has no regional wall motion abnormalities. The left ventricular internal cavity size was normal in size. There is  no left ventricular hypertrophy. Left ventricular diastolic parameters are consistent with Grade I diastolic dysfunction (impaired relaxation). Normal left ventricular filling pressure. Right Ventricle: The right ventricular size is normal. No increase in right ventricular wall thickness. Right ventricular systolic function is normal. Tricuspid regurgitation signal is inadequate for assessing PA pressure. Left Atrium: Left atrial size was normal in size. Right Atrium: Right atrial size was normal in size. Pericardium: There is no evidence of pericardial effusion. Mitral Valve: Thickening of distal anterior MV leaflet likely represents calcification, cannot exclude small vegetation. Consider TEE if clinical suspicion of endocarditis. The mitral valve is abnormal. There is mild thickening of the mitral valve leaflet(s). There is mild calcification of the mitral valve leaflet(s). Mild mitral annular calcification. Mild mitral valve regurgitation. No evidence of mitral valve stenosis. MV peak gradient, 4.1 mmHg. The mean mitral valve gradient is 2.0 mmHg. Tricuspid Valve: The tricuspid valve is normal in structure. Tricuspid valve regurgitation is not demonstrated. No evidence of tricuspid stenosis. Aortic Valve: The aortic valve is tricuspid. There is moderate calcification of the aortic valve. There is moderate thickening of the  aortic valve. There is moderate aortic valve annular calcification. Aortic valve regurgitation is not visualized. No aortic stenosis is present. Aortic valve mean gradient measures 3.0 mmHg. Aortic valve peak gradient measures 7.8 mmHg. Aortic valve area, by VTI measures 1.97 cm. Pulmonic Valve: The pulmonic valve was not well visualized. Pulmonic valve regurgitation is not visualized. No evidence of pulmonic stenosis. Aorta: The aortic root is normal in size and structure and the ascending aorta was not well visualized. Venous: The inferior vena cava is normal in size with greater than 50% respiratory variability, suggesting right atrial pressure of 3 mmHg. IAS/Shunts: No atrial level shunt detected by color flow Doppler.  LEFT VENTRICLE PLAX 2D LVIDd:         4.15 cm     Diastology LVIDs:         3.05 cm     LV e' medial:    6.31 cm/s LV PW:         1.00 cm     LV E/e' medial:  14.5 LV IVS:        1.20 cm     LV e'  lateral:   9.03 cm/s LVOT diam:     1.90 cm     LV E/e' lateral: 10.1 LV SV:         52 LV SV Index:   28 LVOT Area:     2.84 cm  LV Volumes (MOD) LV vol d, MOD A2C: 70.7 ml LV vol d, MOD A4C: 54.9 ml LV vol s, MOD A2C: 34.3 ml LV vol s, MOD A4C: 27.0 ml LV SV MOD A2C:     36.4 ml LV SV MOD A4C:     54.9 ml LV SV MOD BP:      34.1 ml RIGHT VENTRICLE RV Basal diam:  2.65 cm RV Mid diam:    2.10 cm RV S prime:     13.40 cm/s TAPSE (M-mode): 2.0 cm LEFT ATRIUM             Index        RIGHT ATRIUM           Index LA diam:        3.60 cm 1.97 cm/m   RA Area:     13.60 cm LA Vol (A2C):   31.8 ml 17.37 ml/m  RA Volume:   27.40 ml  14.96 ml/m LA Vol (A4C):   42.9 ml 23.43 ml/m LA Biplane Vol: 38.2 ml 20.86 ml/m  AORTIC VALVE                    PULMONIC VALVE AV Area (Vmax):    1.82 cm     PV Vmax:       1.08 m/s AV Area (Vmean):   1.70 cm     PV Peak grad:  4.7 mmHg AV Area (VTI):     1.97 cm AV Vmax:           140.00 cm/s AV Vmean:          83.300 cm/s AV VTI:            0.263 m AV Peak Grad:       7.8 mmHg AV Mean Grad:      3.0 mmHg LVOT Vmax:         90.00 cm/s LVOT Vmean:        49.800 cm/s LVOT VTI:          0.183 m LVOT/AV VTI ratio: 0.70  AORTA Ao Root diam: 2.90 cm MITRAL VALVE MV Area (PHT): 3.77 cm     SHUNTS MV Area VTI:   1.49 cm     Systemic VTI:  0.18 m MV Peak grad:  4.1 mmHg     Systemic Diam: 1.90 cm MV Mean grad:  2.0 mmHg MV Vmax:       1.01 m/s MV Vmean:      65.4 cm/s MV Decel Time: 201 msec MV E velocity: 91.30 cm/s MV A velocity: 103.00 cm/s MV E/A ratio:  0.89 Carlyle Dolly MD Electronically signed by Carlyle Dolly MD Signature Date/Time: 07/12/2021/11:14:15 AM    Final   [2 weeks]   Assessment:  66 year old male with past medical history of CAD, PAD, prior stroke, CKD, presenting via EMS after being found in debilitating conditions without oral intake for several days and multiple falls.  He was admitted with streptococcal G sepsis, rhabdomyolysis, acute on chronic kidney disease, multiple metabolic derangements in setting of dehydration and failure to thrive.  Echo with possible mitral valve vegetation with consideration for TEE, pending repeat blood culture reports per ID.  GI  consulted for anemia.  Microcytic anemia/IDA: On Plavix and aspirin as an outpatient.  Found to have hemoglobin of 8.3, hemoglobin 13.6 in 2021.  Hemoglobin down to 6.7 January 15.  Ferritin 124, iron 11, iron saturations 4%.  B12 normal, vitamin B1 305.2.  Hemoccult pending.  After 2 units of packed red blood cells, hemoglobin up to 10.3.  Hemoglobin stable at 9.5.  No overt GI bleeding this admission.  Per patient, last colonoscopy/EGD about 8 years ago in [place], [place].  Plans for outpatient EGD/colonoscopy given concern for possible endocarditis and septicemia.  Elevated transaminases: Previously normal in 2018.  On presentation, AST 166 and peaked at 251 yesterday.  ALT was 161 and peaked at 221 yesterday.  Alkaline phosphatase and bilirubin normal.  INR has been normal.  IgG 761, ANA and ASMA  pending.  Hepatitis B surface antigen negative, hepatitis C antibody negative, hepatitis A IgM negative, hepatitis B core IgM negative.  CT abdomen pelvis without contrast with unremarkable liver/gallbladder.  Large amount of retained stool with possible fecal impaction.  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  Denies starting new medication or Tylenol use.  Abnormal LFTs possibly multifactorial in the setting of sepsis, hypotension, rhabdomyolysis.  Fecal impaction: Present on admission, seen by CT.  Patient reported diarrhea as an outpatient, likely overflow.  Attempted fecal disimpaction via DRE January 16 but unsuccessful.  Significant stooling after MiraLAX and enema.  Will need daily outpatient bowel regimen.  Plan: Outpatient EGD/colonoscopy. Continue to hold hepatotoxic drugs. Continue pantoprazole. Monitor for significant decline in H/H or overt GI bleeding.  Continue miralax BID. LFT, CBC tomorrow.   Laureen Ochs. Bernarda Caffey [facility] [phone] 1/19/20237:09 PM    LOS: 5 days

## 2021-07-15 NOTE — TOC Progression Note (Signed)
Transition of Care [facility]) - Progression Note    Patient Details  Name: TALON REGALA MRN: [id number] Date of Birth: May 31, 1956  Transition of Care [facility]) CM/SW Contact  Shade Flood, LCSW Phone Number: 07/15/2021, 1:15 PM  Clinical Narrative:     TOC following. Per MD, pt electrolytes off and he is not stable for dc today. Updated Bryson Ha at [facility]. Will follow up tomorrow.  Expected Discharge Plan: [place] Barriers to Discharge: Continued Medical Work up  Expected Discharge Plan and Services Expected Discharge Plan: [place] In-house Referral: Clinical Social Work   Post Acute Care Choice: [place] Living arrangements for the past 2 months: Apartment                                       Social Determinants of Health (SDOH) Interventions    Readmission Risk Interventions No flowsheet data found.

## 2021-07-16 ENCOUNTER — Telehealth: Payer: Self-pay | Admitting: Gastroenterology

## 2021-07-16 DIAGNOSIS — R531 Weakness: Secondary | ICD-10-CM | POA: Diagnosis not present

## 2021-07-16 DIAGNOSIS — E871 Hypo-osmolality and hyponatremia: Secondary | ICD-10-CM

## 2021-07-16 DIAGNOSIS — B954 Other streptococcus as the cause of diseases classified elsewhere: Secondary | ICD-10-CM | POA: Diagnosis not present

## 2021-07-16 DIAGNOSIS — A419 Sepsis, unspecified organism: Secondary | ICD-10-CM | POA: Diagnosis not present

## 2021-07-16 DIAGNOSIS — E440 Moderate protein-calorie malnutrition: Secondary | ICD-10-CM

## 2021-07-16 DIAGNOSIS — R7401 Elevation of levels of liver transaminase levels: Secondary | ICD-10-CM | POA: Diagnosis not present

## 2021-07-16 DIAGNOSIS — I69918 Other symptoms and signs involving cognitive functions following unspecified cerebrovascular disease: Secondary | ICD-10-CM | POA: Diagnosis not present

## 2021-07-16 DIAGNOSIS — E860 Dehydration: Secondary | ICD-10-CM | POA: Diagnosis not present

## 2021-07-16 DIAGNOSIS — I639 Cerebral infarction, unspecified: Secondary | ICD-10-CM | POA: Diagnosis not present

## 2021-07-16 DIAGNOSIS — K5641 Fecal impaction: Secondary | ICD-10-CM | POA: Diagnosis not present

## 2021-07-16 DIAGNOSIS — I2510 Atherosclerotic heart disease of native coronary artery without angina pectoris: Secondary | ICD-10-CM | POA: Diagnosis not present

## 2021-07-16 DIAGNOSIS — B9620 Unspecified Escherichia coli [E. coli] as the cause of diseases classified elsewhere: Secondary | ICD-10-CM | POA: Diagnosis not present

## 2021-07-16 DIAGNOSIS — I69991 Dysphagia following unspecified cerebrovascular disease: Secondary | ICD-10-CM | POA: Diagnosis not present

## 2021-07-16 DIAGNOSIS — I69920 Aphasia following unspecified cerebrovascular disease: Secondary | ICD-10-CM | POA: Diagnosis not present

## 2021-07-16 DIAGNOSIS — A409 Streptococcal sepsis, unspecified: Secondary | ICD-10-CM | POA: Diagnosis not present

## 2021-07-16 DIAGNOSIS — R5381 Other malaise: Secondary | ICD-10-CM | POA: Diagnosis not present

## 2021-07-16 DIAGNOSIS — R7989 Other specified abnormal findings of blood chemistry: Secondary | ICD-10-CM | POA: Diagnosis not present

## 2021-07-16 DIAGNOSIS — R279 Unspecified lack of coordination: Secondary | ICD-10-CM | POA: Diagnosis not present

## 2021-07-16 DIAGNOSIS — I10 Essential (primary) hypertension: Secondary | ICD-10-CM | POA: Diagnosis not present

## 2021-07-16 DIAGNOSIS — Z8673 Personal history of transient ischemic attack (TIA), and cerebral infarction without residual deficits: Secondary | ICD-10-CM | POA: Diagnosis not present

## 2021-07-16 DIAGNOSIS — N390 Urinary tract infection, site not specified: Secondary | ICD-10-CM | POA: Diagnosis not present

## 2021-07-16 DIAGNOSIS — Z515 Encounter for palliative care: Secondary | ICD-10-CM | POA: Diagnosis not present

## 2021-07-16 DIAGNOSIS — N179 Acute kidney failure, unspecified: Secondary | ICD-10-CM | POA: Diagnosis not present

## 2021-07-16 DIAGNOSIS — Z7401 Bed confinement status: Secondary | ICD-10-CM | POA: Diagnosis not present

## 2021-07-16 DIAGNOSIS — R262 Difficulty in walking, not elsewhere classified: Secondary | ICD-10-CM | POA: Diagnosis not present

## 2021-07-16 DIAGNOSIS — D649 Anemia, unspecified: Secondary | ICD-10-CM | POA: Diagnosis not present

## 2021-07-16 DIAGNOSIS — M6282 Rhabdomyolysis: Secondary | ICD-10-CM | POA: Diagnosis not present

## 2021-07-16 LAB — BASIC METABOLIC PANEL
Anion gap: 10 (ref 5–15)
BUN: 15 mg/dL (ref 8–23)
CO2: 20 mmol/L — ABNORMAL LOW (ref 22–32)
Calcium: 8.1 mg/dL — ABNORMAL LOW (ref 8.9–10.3)
Chloride: 109 mmol/L (ref 98–111)
Creatinine, Ser: 1.28 mg/dL — ABNORMAL HIGH (ref 0.61–1.24)
GFR, Estimated: >60 mL/min (ref >60)
Glucose, Bld: 102 mg/dL — ABNORMAL HIGH (ref 70–99)
Potassium: 3.7 mmol/L (ref 3.5–5.1)
Sodium: 139 mmol/L (ref 135–145)

## 2021-07-16 LAB — HEPATIC FUNCTION PANEL
ALT: 152 U/L — ABNORMAL HIGH (ref 0–44)
AST: 105 U/L — ABNORMAL HIGH (ref 15–41)
Albumin: 1.9 g/dL — ABNORMAL LOW (ref 3.5–5.0)
Alkaline Phosphatase: 60 U/L (ref 38–126)
Bilirubin, Direct: 0.1 mg/dL (ref 0.0–0.2)
Indirect Bilirubin: 0.6 mg/dL (ref 0.3–0.9)
Total Bilirubin: 0.7 mg/dL (ref 0.3–1.2)
Total Protein: 5.4 g/dL — ABNORMAL LOW (ref 6.5–8.1)

## 2021-07-16 LAB — CBC
HCT: 30.1 % — ABNORMAL LOW (ref 39.0–52.0)
Hemoglobin: 9.1 g/dL — ABNORMAL LOW (ref 13.0–17.0)
MCH: 24.7 pg — ABNORMAL LOW (ref 26.0–34.0)
MCHC: 30.2 g/dL (ref 30.0–36.0)
MCV: 81.6 fL (ref 80.0–100.0)
Platelets: 273 10*3/uL (ref 150–400)
RBC: 3.69 MIL/uL — ABNORMAL LOW (ref 4.22–5.81)
RDW: 19 % — ABNORMAL HIGH (ref 11.5–15.5)
WBC: 10.2 10*3/uL (ref 4.0–10.5)
nRBC: 0 % (ref 0.0–0.2)

## 2021-07-16 MED ORDER — ALPRAZOLAM 1 MG PO TABS: 0.5000 mg | ORAL_TABLET | Freq: Three times a day (TID) | ORAL | 0 refills | Status: DC | PRN

## 2021-07-16 MED ORDER — METOPROLOL SUCCINATE ER 25 MG PO TB24: 12.5000 mg | ORAL_TABLET | Freq: Two times a day (BID) | ORAL | Status: DC

## 2021-07-16 MED ORDER — CEPHALEXIN 500 MG PO CAPS: 500.0000 mg | ORAL_CAPSULE | Freq: Three times a day (TID) | ORAL | 0 refills | Status: AC

## 2021-07-16 MED ORDER — POLYETHYLENE GLYCOL 3350 17 G PO PACK: 17.0000 g | PACK | Freq: Two times a day (BID) | ORAL | Status: DC

## 2021-07-16 MED ORDER — CLOPIDOGREL BISULFATE 75 MG PO TABS
75.0000 mg | ORAL_TABLET | Freq: Every day | ORAL | Status: AC
Start: 2021-07-21 — End: ?

## 2021-07-16 MED ORDER — ENSURE ENLIVE PO LIQD: 237.0000 mL | Freq: Three times a day (TID) | ORAL | Status: DC

## 2021-07-16 MED ORDER — COVID-19MRNA BIVAL VACC PFIZER 30 MCG/0.3ML IM SUSP
0.3000 mL | Freq: Once | INTRAMUSCULAR | Status: AC
Start: 2021-07-16 — End: 2021-07-16
  Administered 2021-07-16: 0.3 mL via INTRAMUSCULAR
  Filled 2021-07-16: qty 0.3

## 2021-07-16 MED ORDER — UMECLIDINIUM BROMIDE 62.5 MCG/ACT IN AEPB: 1.0000 | INHALATION_SPRAY | Freq: Every day | RESPIRATORY_TRACT | Status: DC

## 2021-07-16 MED ORDER — PANTOPRAZOLE SODIUM 40 MG PO TBEC: 40.0000 mg | DELAYED_RELEASE_TABLET | Freq: Every day | ORAL | 3 refills | Status: AC

## 2021-07-16 NOTE — TOC Transition Note (Signed)
Transition of Care [facility]) - CM/SW Discharge Note   Patient Details  Name: George Valdez MRN: [id number] Date of Birth: 09-13-1955  Transition of Care [facility]) CM/SW Contact:  Shade Flood, LCSW Phone Number: 07/16/2021, 1:57 PM   Clinical Narrative:     DC orders entered. DC clinical sent electronically to [facility] rehab. Updated Bryson Ha at [facility] RN to call report. Pt will transport with EMS.  Updated pt's daughter, Anderson Malta.   There are no other TOC needs for dc.  Final next level of care: Skilled Nursing Facility Barriers to Discharge: Barriers Resolved   Patient Goals and CMS Choice Patient states their goals for this hospitalization and ongoing recovery are:: get better CMS Medicare.gov Compare Post Acute Care list provided to:: Patient    Discharge Placement              Patient chooses bed at: [facility] Patient to be transferred to facility by: EMS Name of family member notified: Anderson Malta Patient and family notified of of transfer: 07/16/21  Discharge Plan and Services In-house Referral: Clinical Social Work   Post Acute Care Choice: [place]                               Social Determinants of Health (SDOH) Interventions     Readmission Risk Interventions No flowsheet data found.

## 2021-07-16 NOTE — TOC Progression Note (Signed)
Transition of Care [facility]) - Progression Note    Patient Details  Name: George Valdez MRN: [id number] Date of Birth: Aug 09, 1955  Transition of Care [facility]) CM/SW Contact  Shade Flood, LCSW Phone Number: 07/16/2021, 12:48 PM  Clinical Narrative:     Per MD, pt stable for dc today. Updated Bryson Ha at [facility] earlier this AM and they can accept pt today. Asked MD for covid booster prior to dc if pt agreeable.   Once dc orders and DC summary entered, TOC will arrange dc.  Expected Discharge Plan: [place] Barriers to Discharge: Continued Medical Work up  Expected Discharge Plan and Services Expected Discharge Plan: [place] In-house Referral: Clinical Social Work   Post Acute Care Choice: [place] Living arrangements for the past 2 months: Apartment                                       Social Determinants of Health (SDOH) Interventions    Readmission Risk Interventions No flowsheet data found.

## 2021-07-16 NOTE — Telephone Encounter (Signed)
Stacey: patient is a Set designer patient. Need outpatient follow-up with any APP or him to arrange colonoscopy/EGD. Need in next 2-4 weeks.

## 2021-07-16 NOTE — Progress Notes (Signed)
Subjective: No abdominal pain, N/V. No overt GI bleeding. Tolerating regular diet. Per nursing staff, no constipation. Last BM yesterday.   Objective: Vital signs in last 24 hours: Temp:  [97.5 F (36.4 C)-98.2 F (36.8 C)] 97.5 F (36.4 C) (01/20 0509) Pulse Rate:  [61-101] 98 (01/20 0509) Resp:  [17-20] 18 (01/20 0509) BP: (82-127)/(61-71) 113/61 (01/20 0509) SpO2:  [90 %-99 %] 97 % (01/20 0736) Last BM Date: 07/15/21 General:   Alert and oriented, pleasant, chronically ill-appearing Head:  Normocephalic and atraumatic. Abdomen:  Bowel sounds present, soft, non-tender, non-distended. No HSM or hernias noted. No rebound or guarding. No masses appreciated  Extremities:  Without edema. Neurologic:  Alert and  oriented x4 Psych:  Alert and cooperative. Normal mood and affect.  Intake/Output from previous day: 01/19 0701 - 01/20 0700 In: 1200 [P.O.:1200] Out: 2350 [Urine:2350] Intake/Output this shift: No intake/output data recorded.  Lab Results: Recent Labs    07/14/21 0655 07/16/21 0522  WBC 10.5 10.2  HGB 9.5* 9.1*  HCT 30.0* 30.1*  PLT 302 273   BMET Recent Labs    07/14/21 0655 07/16/21 0522  NA 143 139  K 3.4* 3.7  CL 117* 109  CO2 17* 20*  GLUCOSE 89 102*  BUN 17 15  CREATININE 1.60* 1.28*  CALCIUM 8.0* 8.1*   LFT Recent Labs    07/14/21 0655 07/16/21 0522  PROT 5.6* 5.4*  ALBUMIN 1.9* 1.9*  AST 251* 105*  ALT 221* 152*  ALKPHOS 72 60  BILITOT 0.5 0.7  BILIDIR  --  0.1  IBILI  --  0.6     Assessment: 65 year old male with past medical history of CAD, PAD, prior stroke, CKD, presenting via EMS after being found in debilitating conditions without oral intake for several days and multiple falls.  He was admitted with streptococcal G sepsis, rhabdomyolysis, acute on chronic kidney disease, multiple metabolic derangements in setting of dehydration and failure to thrive.  Echo with possible mitral valve vegetation with consideration for TEE if  repeat blood culture reports are positive per ID consultation.   GI consulted for anemia.   Microcytic anemia/IDA: On Plavix and aspirin as an outpatient.  Found to have hemoglobin of 8.3, hemoglobin 13.6 in 2021.  Hemoglobin down to 6.7 January 15.  Ferritin 124, iron 11, iron saturations 4%.  B12 normal, vitamin B1 305.2.  Hemoccult pending.  Received 2 units PRBCs this admission. No overt GI bleeding this admission.  Per patient, last colonoscopy/EGD about 8 years ago in [place], [place].  Plans for outpatient EGD/colonoscopy in light of current illness.    Elevated transaminases: Previously normal in 2018.  Transaminases trending downwards.   INR has been normal.  IgG WNL, ANA and ASMA negative.  Hepatitis B surface antigen negative, hepatitis C antibody negative, hepatitis A IgM negative, hepatitis B core IgM negative.  CT abdomen pelvis without contrast with unremarkable liver/gallbladder.  Abnormal LFTs  multifactorial in the setting of sepsis, hypotension, rhabdomyolysis.   Fecal impaction: Present on admission, seen by CT.  Patient reported diarrhea as an outpatient, likely overflow.  Attempted fecal disimpaction via DRE January 16 but unsuccessful. He did have a good response following enema and Miralax. Fecal impaction resolved. Continue Miralax BID.      Plan: Will pursue outpatient colonoscopy/EGD Continue PPI Continue Miralax BID Signing off. Will arrange outpatient follow-up   Annitta Needs, PhD, ANP-BC [facility]    LOS: 6 days    07/16/2021, 10:45 AM

## 2021-07-16 NOTE — Care Management Important Message (Signed)
Important Message  Patient Details  Name: George Valdez MRN: [id number] Date of Birth: 07-Aug-1955   Medicare Important Message Given:  Yes     Tommy Medal 07/16/2021, 2:40 PM

## 2021-07-16 NOTE — Discharge Summary (Signed)
Physician Discharge Summary  George Valdez:[id number] DOB: May 12, 1956 DOA: 07/10/2021  PCP: Patient, No Pcp Per (Inactive)  Admit date: 07/10/2021 Discharge date: 07/16/2021  Time spent: 35 minutes  Recommendations for Outpatient Follow-up:  Recommending outpatient follow-up with palliative care to further discuss advance care planning and future goals of care based on improvement/rehabilitation outcome. Repeat basic metabolic panel to follow electrolytes and renal function; in 1 week Repeat LFTs to follow liver function stability; in 1 week Reassess blood pressure and adjust antihypertensive regimen as needed. Repeat CBC to follow hemoglobin trend/stability; in 1 week. Make sure patient has follow-up with gastroenterology service as an outpatient as instructed. 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 coli UTI (urinary tract infection)   Malnutrition of moderate degree   Abnormal LFTs   Fecal impaction (HCC)   Hyponatremia   Discharge Condition: Stable and improved.  Discharged to skilled nursing facility for further care and rehabilitation.    CODE STATUS: DNR  Diet recommendation: Heart healthy diet.  Filed Weights   07/10/21 2047  Weight: 79.8 kg    History of present illness:  As per H&P written by Dr. Josephine Cables on 07/10/2021 George Valdez is a 66  y.o. male with medical history significant for hypertension, CAD/PAD, prior stroke with subsequent right-sided weakness, hyperlipidemia, chronic disease stage III who presents to the emergency department via EMS due to fall and generalized weakness.  Patient complained of 3-day onset of increased weakness, decreased food and fluid intake.  Patient patient states that he lives at home with wife who also have similar conditions and was taken to the ED for further evaluation and management.  When EMS team first arrived to the house, he was met on the floor, he was picked up and placed on his electric chair while wife was being taken to the ED.  On return to check on patient, he was already back to the floor.  He only complained of weakness and denies chest pain, shortness of breath, fever, chills, headache, blurry vision, numbness and tingling.   ED Course:  In the emergency department, he was initially tachypneic, BP was 95/82 and other signs are within normal range.  Work-up in the ED showed leukocytosis, MCV 78.5, hyponatremia, hypokalemia, BUN to creatinine 39/3.08 (most recent creatinine for comparison was 4 years ago and it was 0.7-1.1).  Total CK 3313, magnesium 1.6, urine drug screen was positive for benzodiazepine, urinalysis was unimpressive for UTI.  Influenza A, B, SARS coronavirus 2 was negative. Chest x-ray showed no active disease CT of head without contrast showed chronic ischemic changes without acute abnormality CT abdomen and pelvis without contrast showed subcutaneous fat stranding right gluteal region which may reflect direct trauma and ecchymosis. He was treated with IV ceftriaxone due to presumed UTI, IV hydration was provided.  Potassium was replenished, magnesium was replenished.  Hospitalist was asked to admit patient for further evaluation and management  Hospital Course:  1)Streptococcal G Bacteremia Sepsis---- --WBC 22.8 >>18.4 >>14.7>>11.4 PCT 1.12 07/12/21---TTE---shows  Thickening of distal anterior MV leaflet likely represents. calcification, cannot exclude small vegetation. Consider TEE if clinical suspicion of endocarditis or if repeat blood culture has remained positive.. -ID Physician Dr Gale Journey rec--Please repeat blood culture,. If positive, or if there are evidence of embolic phenomenom. Or if the initial cultures all bottles grow would consider TEE (currently only 1 of 2 initial blood cultures for strep G)--Per ID physician strep G bacteremia is probably pathogenic and Not a contaminant -Okay to transition from Rocephin to Keflex on 07/14/2020; planning to treat for 7 more days at discharge to complete antibiotic therapy. -Repeat blood cultures without growth.   2)Rhabdomyolysis in the setting of frequent falls--- CPK trended down appropriately -Continue to maintain adequate hydration.   3)E coli UTI---Okay to transition from Rocephin to Keflex on 07/14/2020. -Continue current oral antibiotics -Patient denies dysuria, hematuria or any other complaints.  4)Recurrent falls--- AM cortisol is not low, doubt orthostatic drops. -Patient with old/residual weakness due to prior strokes  CT abdomen and pelvis without acute intra-abdominal finding -Benzodiazepine may be contributing to recurrent falls PTA pt lived at home and did very poorly, patient has significant limitations with mobility related ADLs- this patient needs to continue to be monitored in the hospital until a SNF bed is obtained as he is not safe to go home with his current physcical limitations -PT eval appreciated recommends SNF rehab -Patient will be discharged to [facility] for further care and rehabilitation.   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 -Repeat basic metabolic panel to follow renal function trend/stability.   6)CAD/PAD/history of strokes-----Patient with old/residual weakness due to prior strokes. -Currently more weak and deconditioned -Continue aspirin; continue to hold Plavix for another week. -Endoscopy/colonoscopy by GI as an outpatient. -No new deficits appreciated. -Safe to resume the use of statins.   6)COPD- -continue bronchodilators -No wheezing currently. -Good oxygen saturation appreciated on room air.   7)Transaminitis--- most likely due to rhabdomyolysis, monitor closely -Acute viral hepatitis panel negative for hep A, hep B, and hep C -INR 1.2 -Continue to follow LFTs intermittently -Safe to resume statins.   8) hypomagnesemia/hyponatremia/hypokalemia--- -dehydration and poor oral intake as triggering  -Continue to follow electrolytes and further replete as needed. -Maintenance supplementation has been initiated -Continue to follow electrolytes trend -Maintain adequate hydration.   9) acute on chronic iron deficiency anemia-- Hgb was down to 6.7  -2 units PRBC has been transfused; hemoglobin has remained stable since then. -Would benefit from EGD and colonoscopy (so far plan to be done as an outpatient). -GI consult appreciated -Continue Protonix for now -No overt bleeding noted -Okay to continue the use of aspirin and resume Plavix in 1 week.  Patient using these medications as part of CVA secondary prevention.  Procedures: See below for x-ray reports.  Consultations: Gastroenterology service Palliative care Infectious disease  Discharge Exam: Vitals:   07/16/21 0736 07/16/21 1244  BP:  (!) 120/91  Pulse:  (!) 101  Resp:  18  Temp:  (!) 97.4 F (36.3 C)  SpO2: 97% 98%   General exam: Alert, awake,  oriented x 3; chronically ill in appearance, weak and deconditioned.  Reports no chest pain, palpitations or shortness of  breath.  No overt bleeding. Respiratory system: Clear to auscultation. Respiratory effort normal.  No requiring oxygen supplementation. Cardiovascular system:RRR. No rubs or gallops.  No JVD. Gastrointestinal system: Abdomen is nondistended, soft and nontender. No organomegaly or masses felt. Normal bowel sounds heard. Central nervous system: No focal neurological deficits. Extremities: No cyanosis or clubbing. Skin: No petechiae. Psychiatry: Judgement and insight appear normal. Mood & affect appropriate.   Discharge Instructions   Discharge Instructions     Diet - low sodium heart healthy   Complete by: As directed    Discharge instructions   Complete by: As directed    The medications as prescribed Rehabilitation and conditioning as per the skilled nursing facility protocol Arrange follow-up with PCP in 10 days after discharge from SNF. Follow heart healthy diet Outpatient follow-up with gastroenterology service as instructed (office will contact with appointment details). Avoid the use of NSAIDs (safe to resume Plavix in 6 days).   Discharge wound care:   Complete by: As directed    Make sure that moisture associated skin damage in his coccyx remains clean and dry as much as possible to prevent the chances of future skin damages and infection.   Increase activity slowly   Complete by: As directed       Allergies as of 07/16/2021       Reactions   Codeine Itching        Medication List     STOP taking these medications    chlordiazePOXIDE 5 MG capsule Commonly known as: LIBRIUM   folic acid 1 MG tablet Commonly known as: FOLVITE   gabapentin 300 MG capsule Commonly known as: NEURONTIN   HYDROcodone-acetaminophen 5-325 MG tablet Commonly known as: NORCO/VICODIN   isosorbide mononitrate 20 MG tablet Commonly known as: ISMO   tiotropium 18 MCG inhalation capsule Commonly known as: SPIRIVA       TAKE these medications    albuterol 108 (90 Base) MCG/ACT  inhaler Commonly known as: VENTOLIN HFA Inhale 2 puffs into the lungs every 6 (six) hours as needed for wheezing or shortness of breath.   ALPRAZolam 1 MG tablet Commonly known as: XANAX Take 0.5 tablets (0.5 mg total) by mouth every 8 (eight) hours as needed for anxiety. What changed:  how much to take when to take this   aspirin EC 81 MG tablet Take 81 mg by mouth daily. Reported on 11/16/2015   cephALEXin 500 MG capsule Commonly known as: KEFLEX Take 1 capsule (500 mg total) by mouth 3 (three) times daily for 7 days.   clopidogrel 75 MG tablet Commonly known as: PLAVIX Take 1 tablet (75 mg total) by mouth daily. Start taking on: July 21, 2021 What changed: These instructions start on July 21, 2021. If you are unsure what to do until then, ask your doctor or other care provider.   cyanocobalamin 1000 MCG/ML injection Commonly known as: (VITAMIN B-12) Inject 1,000 mcg into the muscle every 30 (thirty) days.   DULoxetine 60 MG capsule Commonly known as: CYMBALTA Take 60 mg by mouth daily.   ezetimibe 10 MG tablet Commonly known as: ZETIA Take 1 tablet (10 mg total) by mouth daily.   feeding supplement Liqd Take 237 mLs by mouth 3 (three) times daily between meals.   metoprolol succinate 25 MG 24 hr tablet Commonly known as: TOPROL-XL Take 0.5 tablets (12.5 mg total) by mouth 2 (  two) times daily. Take with or immediately following a meal. What changed:  medication strength how much to take   multivitamin with minerals Tabs tablet Take 1 tablet by mouth daily.   pantoprazole 40 MG tablet Commonly known as: PROTONIX Take 1 tablet (40 mg total) by mouth daily. Start taking on: July 17, 2021   polyethylene glycol 17 g packet Commonly known as: MIRALAX / GLYCOLAX Take 17 g by mouth 2 (two) times daily.   Potassium Chloride ER 20 MEQ Tbcr Take 20 mEq by mouth daily.   rosuvastatin 40 MG tablet Commonly known as: CRESTOR Take 40 mg by mouth daily.    thiamine 100 MG tablet Take 1 tablet (100 mg total) by mouth daily.   umeclidinium bromide 62.5 MCG/ACT Aepb Commonly known as: INCRUSE ELLIPTA Inhale 1 puff into the lungs daily. Start taking on: July 17, 2021   vitamin E 180 MG (400 UNITS) capsule Take 400 Units by mouth daily.               Discharge Care Instructions  (From admission, onward)           Start     Ordered   07/16/21 0000  Discharge wound care:       Comments: Make sure that moisture associated skin damage in his coccyx remains clean and dry as much as possible to prevent the chances of future skin damages and infection.   07/16/21 1312           Allergies  Allergen Reactions   Codeine Itching    Contact information for after-discharge care     [facility] .   Service: Skilled Nursing Contact information: [address] [place] [phone]                     The results of significant diagnostics from this hospitalization (including imaging, microbiology, ancillary and laboratory) are listed below for reference.    Significant Diagnostic Studies: CT ABDOMEN PELVIS WO CONTRAST  Result Date: 07/10/2021 CLINICAL DATA:  [place], generalized weakness EXAM: CT ABDOMEN AND PELVIS WITHOUT CONTRAST TECHNIQUE: Multidetector CT imaging of the abdomen and pelvis was performed following the standard protocol without IV contrast. Unenhanced CT was performed per clinician order. Lack of IV contrast limits sensitivity and specificity, especially for evaluation of abdominal/pelvic solid viscera. RADIATION DOSE REDUCTION: This exam was performed according to the departmental dose-optimization program which includes automated exposure control, adjustment of the mA and/or kV according to patient size and/or use of iterative reconstruction technique. COMPARISON:  None. FINDINGS: Lower chest: Bibasilar scarring and  fibrosis. No acute pleural or parenchymal lung disease. Hepatobiliary: Unremarkable unenhanced appearance of the liver and gallbladder. Pancreas: Unremarkable unenhanced appearance. Spleen: Unremarkable unenhanced appearance. Adrenals/Urinary Tract: Low-attenuation nodular thickening left adrenal gland consistent with hyperplasia or adenoma. Right adrenals unremarkable. No urinary tract calculi or obstructive uropathy within either kidney. Vascular calcifications are seen at the renal hila. The bladder is unremarkable. Stomach/Bowel: No bowel obstruction or ileus. Large amount of retained stool within the rectal vault may reflect fecal impaction. Normal appendix right lower quadrant. No bowel wall thickening or inflammatory change. Vascular/Lymphatic: Diffuse aortic atherosclerosis. Evaluation of the vascular lumen limited by the lack of IV contrast. No pathologic adenopathy. Reproductive: Prostate is not enlarged. Testicles are retracted into the distal inguinal canals bilaterally. Other: No free intraperitoneal fluid or free gas. No abdominal wall hernia. Musculoskeletal: Age-indeterminate  fracture of the right fourth costochondral junction, only partially visualized on this study. Chronic right posterolateral eleventh rib fracture with moderate callus formation. No other acute bony abnormalities. Subcutaneous fat stranding in the right gluteal region may reflect direct trauma and ecchymosis. No evidence of hematoma. Reconstructed images demonstrate no additional findings. IMPRESSION: 1. Subcutaneous fat stranding right gluteal region which may reflect direct trauma and ecchymosis. 2. Age indeterminate fracture of the right fourth costochondral junction, only partially visualized on this study. Chronic right posterolateral eleventh rib fracture. 3. Otherwise no acute intra-abdominal crash that marked retained stool within the distal colon, compatible with fecal impaction. No bowel obstruction or ileus. 4.  Aortic  Atherosclerosis (ICD10-I70.0). Electronically Signed   By: Randa Ngo M.D.   On: 07/10/2021 22:37   CT Head Wo Contrast  Result Date: 07/10/2021 CLINICAL DATA:  Recent fall with weakness, initial encounter EXAM: CT HEAD WITHOUT CONTRAST TECHNIQUE: Contiguous axial images were obtained from the base of the skull through the vertex without intravenous contrast. RADIATION DOSE REDUCTION: This exam was performed according to the departmental dose-optimization program which includes automated exposure control, adjustment of the mA and/or kV according to patient size and/or use of iterative reconstruction technique. COMPARISON:  08/22/2016 FINDINGS: Brain: No evidence of acute infarction, hemorrhage, hydrocephalus, extra-axial collection or mass lesion/mass effect. 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 Scattered lacunar infarcts in the basal ganglia bilaterally are again noted. Vascular: No hyperdense vessel or unexpected calcification. Skull: Normal. Negative for fracture or focal lesion. Sinuses/Orbits: No acute finding. Other: None. IMPRESSION: Chronic  ischemic changes without acute abnormality. Electronically Signed   By: [name] M.D.   On: 07/10/2021 21:10   DG Chest Port 1 View  Result Date: 07/10/2021 CLINICAL DATA:  cough, fall EXAM: PORTABLE CHEST 1 VIEW COMPARISON:  Chest x-ray 08/22/2016 FINDINGS: The heart and mediastinal contours are unchanged. Aortic calcification No focal consolidation. No pulmonary edema. No pleural effusion. No pneumothorax. No acute osseous abnormality. IMPRESSION: No active disease. Electronically Signed   By: Iven Finn M.D.   On: 07/10/2021 21:11   ECHOCARDIOGRAM COMPLETE  Result Date: 07/12/2021    ECHOCARDIOGRAM REPORT   Patient Name:   George Valdez Murph Date of Exam: 07/12/2021 Medical Rec #:  [id number]              Height:       63.0 in Accession #:    [id number]             Weight:        175.9 lb Date of Birth:  August 29, 1955              BSA:          1.831 m Patient Age:    62 years               BP:           119/59 mmHg Patient Gender: M                      HR:           99 bpm. Exam Location:  [facility] Procedure: 2D Echo, Cardiac Doppler and Color Doppler Indications:    Bacteremia  History:        Patient has prior history of Echocardiogram examinations, most                 recent 12/04/2017. CAD, PAD  and Stroke; Risk                 Factors:Hypertension.  Sonographer:    Wenda Low Referring Phys: [place]  1. Left ventricular ejection fraction, by estimation, is 55 to 60%. The left ventricle has normal function. The left ventricle has no regional wall motion abnormalities. Left ventricular diastolic parameters are consistent with Grade I diastolic dysfunction (impaired relaxation).  2. Right ventricular systolic function is normal. The right ventricular size is normal. Tricuspid regurgitation signal is inadequate for assessing PA pressure.  3. Thickening of distal anterior MV leaflet likely represents calcification, cannot exclude small vegetation. Consider TEE if clinical suspicion of endocarditis. . The mitral valve is abnormal. Mild mitral valve regurgitation. No evidence of mitral stenosis.  4. The aortic valve is tricuspid. There is moderate calcification of the aortic valve. There is moderate thickening of the aortic valve. Aortic valve regurgitation is not visualized. No aortic stenosis is present.  5. The inferior vena cava is normal in size with greater than 50% respiratory variability, suggesting right atrial pressure of 3 mmHg. FINDINGS  Left Ventricle: Left ventricular ejection fraction, by estimation, is 55 to 60%. The left ventricle has normal function. The left ventricle has no regional wall motion abnormalities. The left ventricular internal cavity size was normal in size. There is  no left ventricular hypertrophy. Left ventricular diastolic  parameters are consistent with Grade I diastolic dysfunction (impaired relaxation). Normal left ventricular filling pressure. Right Ventricle: The right ventricular size is normal. No increase in right ventricular wall thickness. Right ventricular systolic function is normal. Tricuspid regurgitation signal is inadequate for assessing PA pressure. Left Atrium: Left atrial size was normal in size. Right Atrium: Right atrial size was normal in size. Pericardium: There is no evidence of pericardial effusion. Mitral Valve: Thickening of distal anterior MV leaflet likely represents calcification, cannot exclude small vegetation. Consider TEE if clinical suspicion of endocarditis. The mitral valve is abnormal. There is mild thickening of the mitral valve leaflet(s). There is mild calcification of the mitral valve leaflet(s). Mild mitral annular calcification. Mild mitral valve regurgitation. No evidence of mitral valve stenosis. MV peak gradient, 4.1 mmHg. The mean mitral valve gradient is 2.0 mmHg. Tricuspid Valve: The tricuspid valve is normal in structure. Tricuspid valve regurgitation is not demonstrated. No evidence of tricuspid stenosis. Aortic Valve: The aortic valve is tricuspid. There is moderate calcification of the aortic valve. There is moderate thickening of the aortic valve. There is moderate aortic valve annular calcification. Aortic valve regurgitation is not visualized. No aortic stenosis is present. Aortic valve mean gradient measures 3.0 mmHg. Aortic valve peak gradient measures 7.8 mmHg. Aortic valve area, by VTI measures 1.97 cm. Pulmonic Valve: The pulmonic valve was not well visualized. Pulmonic valve regurgitation is not visualized. No evidence of pulmonic stenosis. Aorta: The aortic root is normal in size and structure and the ascending aorta was not well visualized. Venous: The inferior vena cava is normal in size with greater than 50% respiratory variability, suggesting right atrial pressure of  3 mmHg. IAS/Shunts: No atrial level shunt detected by color flow Doppler.  LEFT VENTRICLE PLAX 2D LVIDd:         4.15 cm     Diastology LVIDs:         3.05 cm     LV e' medial:    6.31 cm/s LV PW:         1.00 cm     LV E/e' medial:  14.5 LV IVS:        1.20 cm     LV e' lateral:   9.03 cm/s LVOT diam:     1.90 cm     LV E/e' lateral: 10.1 LV SV:         52 LV SV Index:   28 LVOT Area:     2.84 cm  LV Volumes (MOD) LV vol d, MOD A2C: 70.7 ml LV vol d, MOD A4C: 54.9 ml LV vol s, MOD A2C: 34.3 ml LV vol s, MOD A4C: 27.0 ml LV SV MOD A2C:     36.4 ml LV SV MOD A4C:     54.9 ml LV SV MOD BP:      34.1 ml RIGHT VENTRICLE RV Basal diam:  2.65 cm RV Mid diam:    2.10 cm RV S prime:     13.40 cm/s TAPSE (M-mode): 2.0 cm LEFT ATRIUM             Index        RIGHT ATRIUM           Index LA diam:        3.60 cm 1.97 cm/m   RA Area:     13.60 cm LA Vol (A2C):   31.8 ml 17.37 ml/m  RA Volume:   27.40 ml  14.96 ml/m LA Vol (A4C):   42.9 ml 23.43 ml/m LA Biplane Vol: 38.2 ml 20.86 ml/m  AORTIC VALVE                    PULMONIC VALVE AV Area (Vmax):    1.82 cm     PV Vmax:       1.08 m/s AV Area (Vmean):   1.70 cm     PV Peak grad:  4.7 mmHg AV Area (VTI):     1.97 cm AV Vmax:           140.00 cm/s AV Vmean:          83.300 cm/s AV VTI:            0.263 m AV Peak Grad:      7.8 mmHg AV Mean Grad:      3.0 mmHg LVOT Vmax:         90.00 cm/s LVOT Vmean:        49.800 cm/s LVOT VTI:          0.183 m LVOT/AV VTI ratio: 0.70  AORTA Ao Root diam: 2.90 cm MITRAL VALVE MV Area (PHT): 3.77 cm     SHUNTS MV Area VTI:   1.49 cm     Systemic VTI:  0.18 m MV Peak grad:  4.1 mmHg     Systemic Diam: 1.90 cm MV Mean grad:  2.0 mmHg MV Vmax:       1.01 m/s MV Vmean:      65.4 cm/s MV Decel Time: 201 msec MV E velocity: 91.30 cm/s MV A velocity: 103.00 cm/s MV E/A ratio:  0.89 Carlyle Dolly MD Electronically signed by Carlyle Dolly MD Signature Date/Time: 07/12/2021/11:14:15 AM    Final     Microbiology: Recent Results (from the  past 240 hour(s))  Blood Culture (routine x 2)     Status: Abnormal   Collection Time: 07/10/21  9:13 PM   Specimen: BLOOD RIGHT ARM  Result Value Ref Range Status   Specimen Description   Final    BLOOD RIGHT ARM BOTTLES DRAWN AEROBIC ONLY Performed at [facility]  [facility], [address] [place], [place] [address]    Special Requests   Final    Blood Culture adequate volume Performed at [facility], [address] [place], [place] [address]    Culture  Setup Time   Final    GRAM POSITIVE COCCI Gram Stain Report Called to,Read Back By and Verified With: Mellody Drown @ 1231 07/11/21 BY STEPHTR AEROBIC BOTTLE ONLY Organism ID to follow CRITICAL RESULT CALLED TO, READ BACK BY AND VERIFIED WITHMellody Drown RN [phone] 07/11/21 A BROWNING Performed at [facility], [place] [address], [place], [place] [address]    Culture STREPTOCOCCUS GROUP G (A)  Final   Report Status 07/13/2021 FINAL  Final   Organism ID, Bacteria STREPTOCOCCUS GROUP G  Final      Susceptibility   Streptococcus group g - MIC*    CLINDAMYCIN <=0.25 SENSITIVE Sensitive     AMPICILLIN <=0.25 SENSITIVE Sensitive     ERYTHROMYCIN <=0.12 SENSITIVE Sensitive     VANCOMYCIN 0.5 SENSITIVE Sensitive     CEFTRIAXONE <=0.12 SENSITIVE Sensitive     LEVOFLOXACIN 0.5 SENSITIVE Sensitive     * STREPTOCOCCUS GROUP G  Blood Culture ID Panel (Reflexed)     Status: Abnormal   Collection Time: 07/10/21  9:13 PM  Result Value Ref Range Status   Enterococcus faecalis NOT DETECTED NOT DETECTED Final   Enterococcus Faecium NOT DETECTED NOT DETECTED Final   Listeria monocytogenes NOT DETECTED NOT DETECTED Final   Staphylococcus species NOT DETECTED NOT DETECTED Final   Staphylococcus aureus (BCID) NOT DETECTED NOT DETECTED Final   Staphylococcus epidermidis NOT DETECTED NOT DETECTED Final   Staphylococcus lugdunensis NOT DETECTED NOT DETECTED Final   Streptococcus species DETECTED (A) NOT DETECTED Final    Comment: Not Enterococcus species,  Streptococcus agalactiae, Streptococcus pyogenes, or Streptococcus pneumoniae. CRITICAL RESULT CALLED TO, READ BACK BY AND VERIFIED WITHMellody Drown RN [phone] 07/11/21 A BROWNING    Streptococcus agalactiae NOT DETECTED NOT DETECTED Final   Streptococcus pneumoniae NOT DETECTED NOT DETECTED Final   Streptococcus pyogenes NOT DETECTED NOT DETECTED Final   A.calcoaceticus-baumannii NOT DETECTED NOT DETECTED Final   Bacteroides fragilis NOT DETECTED NOT DETECTED Final   Enterobacterales NOT DETECTED NOT DETECTED Final   Enterobacter cloacae complex NOT DETECTED NOT DETECTED Final   Escherichia coli NOT DETECTED NOT DETECTED Final   Klebsiella aerogenes NOT DETECTED NOT DETECTED Final   Klebsiella oxytoca NOT DETECTED NOT DETECTED Final   Klebsiella pneumoniae NOT DETECTED NOT DETECTED Final   Proteus species NOT DETECTED NOT DETECTED Final   Salmonella species NOT DETECTED NOT DETECTED Final   Serratia marcescens NOT DETECTED NOT DETECTED Final   Haemophilus influenzae NOT DETECTED NOT DETECTED Final   Neisseria meningitidis NOT DETECTED NOT DETECTED Final   Pseudomonas aeruginosa NOT DETECTED NOT DETECTED Final   Stenotrophomonas maltophilia NOT DETECTED NOT DETECTED Final   Candida albicans NOT DETECTED NOT DETECTED Final   Candida auris NOT DETECTED NOT DETECTED Final   Candida glabrata NOT DETECTED NOT DETECTED Final   Candida krusei NOT DETECTED NOT DETECTED Final   Candida parapsilosis NOT DETECTED NOT DETECTED Final   Candida tropicalis NOT DETECTED NOT DETECTED Final   Cryptococcus neoformans/gattii NOT DETECTED NOT DETECTED Final    Comment: Performed at [facility], [address], [place], [place] [address]  Blood Culture (routine x 2)     Status: None   Collection Time: 07/10/21  9:24 PM   Specimen: BLOOD LEFT HAND  Result  Value Ref Range Status   Specimen Description BLOOD LEFT HAND BOTTLES DRAWN AEROBIC ONLY  Final   Special Requests Blood Culture adequate volume   Final   Culture   Final    NO GROWTH 5 DAYS Performed at [facility], [address] [place], [place] [address]    Report Status 07/15/2021 FINAL  Final  Urine Culture     Status: Abnormal   Collection Time: 07/10/21  9:46 PM   Specimen: Urine, Clean Catch  Result Value Ref Range Status   Specimen Description   Final    URINE, CLEAN CATCH Performed at [facility], [address] [place], [place] [address]    Special Requests   Final    NONE Performed at [facility], [address], [place], [place] [address]    Culture >=100,000 COLONIES/mL ESCHERICHIA COLI (A)  Final   Report Status 07/13/2021 FINAL  Final   Organism ID, Bacteria ESCHERICHIA COLI (A)  Final      Susceptibility   Escherichia coli - MIC*    AMPICILLIN >=32 RESISTANT Resistant     CEFAZOLIN <=4 SENSITIVE Sensitive     CEFEPIME <=0.12 SENSITIVE Sensitive     CEFTRIAXONE <=0.25 SENSITIVE Sensitive     CIPROFLOXACIN <=0.25 SENSITIVE Sensitive     GENTAMICIN <=1 SENSITIVE Sensitive     IMIPENEM <=0.25 SENSITIVE Sensitive     NITROFURANTOIN <=16 SENSITIVE Sensitive     TRIMETH/SULFA <=20 SENSITIVE Sensitive     AMPICILLIN/SULBACTAM >=32 RESISTANT Resistant     PIP/TAZO <=4 SENSITIVE Sensitive     * >=100,000 COLONIES/mL ESCHERICHIA COLI  Resp Panel by RT-PCR (Flu A&B, Covid) Nasopharyngeal Swab     Status: None   Collection Time: 07/10/21  9:54 PM   Specimen: Nasopharyngeal Swab; Nasopharyngeal(NP) swabs in vial transport medium  Result Value Ref Range Status   SARS Coronavirus 2 by RT PCR NEGATIVE NEGATIVE Final    Comment: (NOTE) SARS-CoV-2 target nucleic acids are NOT DETECTED.  The SARS-CoV-2 RNA is generally detectable in upper respiratory specimens during the acute phase of infection. The lowest concentration of SARS-CoV-2 viral copies this assay can detect is 138 copies/mL. A negative result does not preclude SARS-Cov-2 infection and should not be used as the sole basis for treatment or other patient  management decisions. A negative result may occur with  improper specimen collection/handling, submission of specimen other than nasopharyngeal swab, presence of viral mutation(s) within the areas targeted by this assay, and inadequate number of viral copies(<138 copies/mL). A negative result must be combined with clinical observations, patient history, and epidemiological information. The expected result is Negative.  Fact Sheet for Patients:  EntrepreneurPulse.com.au  Fact Sheet for Healthcare Providers:  IncredibleEmployment.be  This test is no t yet approved or cleared by the [place] FDA and  has been authorized for detection and/or diagnosis of SARS-CoV-2 by FDA under an Emergency Use Authorization (EUA). This EUA will remain  in effect (meaning this test can be used) for the duration of the COVID-19 declaration under Section 564(b)(1) of the Act, 21 U.S.C.section 360bbb-3(b)(1), unless the authorization is terminated  or revoked sooner.       Influenza A by PCR NEGATIVE NEGATIVE Final   Influenza B by PCR NEGATIVE NEGATIVE Final    Comment: (NOTE) The Xpert Xpress SARS-CoV-2/FLU/RSV plus assay is intended as an aid in the diagnosis of influenza from Nasopharyngeal swab specimens and should not be used as a sole basis for treatment. Nasal washings and aspirates are unacceptable for  Xpert Xpress SARS-CoV-2/FLU/RSV testing.  Fact Sheet for Patients: EntrepreneurPulse.com.au  Fact Sheet for Healthcare Providers: IncredibleEmployment.be  This test is not yet approved or cleared by the [place] FDA and has been authorized for detection and/or diagnosis of SARS-CoV-2 by FDA under an Emergency Use Authorization (EUA). This EUA will remain in effect (meaning this test can be used) for the duration of the COVID-19 declaration under Section 564(b)(1) of the Act, 21 U.S.C. section 360bbb-3(b)(1),  unless the authorization is terminated or revoked.  Performed at [facility], [address], [place], [place] [address]   Culture, blood (routine x 2)     Status: None (Preliminary result)   Collection Time: 07/14/21  6:09 PM   Specimen: BLOOD LEFT ARM  Result Value Ref Range Status   Specimen Description BLOOD LEFT ARM  Final   Special Requests   Final    BOTTLES DRAWN AEROBIC AND ANAEROBIC Blood Culture adequate volume   Culture   Final    NO GROWTH 2 DAYS Performed at [facility], [address], [place], [place] [address]    Report Status PENDING  Incomplete  Culture, blood (routine x 2)     Status: None (Preliminary result)   Collection Time: 07/14/21  6:09 PM   Specimen: BLOOD RIGHT ARM  Result Value Ref Range Status   Specimen Description BLOOD RIGHT ARM  Final   Special Requests   Final    BOTTLES DRAWN AEROBIC AND ANAEROBIC Blood Culture adequate volume   Culture   Final    NO GROWTH 2 DAYS Performed at [facility], [address], [place], [place] [address]    Report Status PENDING  Incomplete    Labs: Basic Metabolic Panel: Recent Labs  Lab 07/10/21 2048 07/11/21 0550 07/12/21 0523 07/13/21 0541 07/14/21 0655 07/16/21 0522  NA 125* 132* 139 139 143 139  K 2.2* 2.3* 2.4* 3.3* 3.4* 3.7  CL 96* 106 113* 115* 117* 109  CO2 17* 16* 17* 17* 17* 20*  GLUCOSE 95 102* 111* 100* 89 102*  BUN 39* 33* 31* '21 17 15  ' CREATININE 3.08* 2.60* 2.33* 1.88* 1.60* 1.28*  CALCIUM 8.2* 7.5* 7.8* 7.6* 8.0* 8.1*  MG 1.6* 2.0 2.0  --   --   --   PHOS  --  3.5  --   --   --   --    Liver Function Tests: Recent Labs  Lab 07/11/21 0550 07/12/21 0523 07/13/21 0541 07/14/21 0655 07/16/21 0522  AST 160* 177* 194* 251* 105*  ALT 135* 138* 151* 221* 152*  ALKPHOS 71 71 69 72 60  BILITOT 0.4 0.6 0.8 0.5 0.7  PROT 5.5* 5.9* 5.2* 5.6* 5.4*  ALBUMIN 2.0* 2.0* 1.7* 1.9* 1.9*   Recent Labs  Lab 07/10/21 2112 07/14/21 1135  AMMONIA 15 12   CBC: Recent Labs  Lab  07/10/21 2048 07/11/21 0550 07/12/21 0523 07/13/21 0541 07/14/21 0655 07/16/21 0522  WBC 22.8* 18.4* 14.7* 11.4* 10.5 10.2  NEUTROABS 20.9*  --   --   --   --   --   HGB 8.3* 6.7* 10.3* 9.1* 9.5* 9.1*  HCT 26.6* 22.3* 32.3* 28.6* 30.0* 30.1*  MCV 78.5* 79.4* 80.0 81.0 80.9 81.6  PLT 363 322 286 281 302 273   Cardiac Enzymes: Recent Labs  Lab 07/10/21 2048 07/11/21 0550 07/14/21 0655  CKTOTAL 3,313* 3,797* 1,665*   CBG: Recent Labs  Lab 07/10/21 2048 07/15/21 2049  GLUCAP 95 96   Signed:  Barton Dubois MD.  [facility] 07/16/2021, 1:25 PM

## 2021-07-16 NOTE — Progress Notes (Signed)
Attempted to call report to [facility]. No answer from facility then no answer from nurse.

## 2021-07-16 NOTE — Progress Notes (Signed)
Patient's daughter requested a call with an update, this RN called but she did not answer and her mail box is full. Will pass it on to day shift. We continue to monitor.

## 2021-07-19 LAB — CULTURE, BLOOD (ROUTINE X 2)
Culture: NO GROWTH
Culture: NO GROWTH
Special Requests: ADEQUATE
Special Requests: ADEQUATE

## 2021-07-20 DIAGNOSIS — R5381 Other malaise: Secondary | ICD-10-CM | POA: Diagnosis not present

## 2021-07-20 DIAGNOSIS — A419 Sepsis, unspecified organism: Secondary | ICD-10-CM | POA: Diagnosis not present

## 2021-07-23 DIAGNOSIS — R531 Weakness: Secondary | ICD-10-CM | POA: Diagnosis not present

## 2021-07-23 DIAGNOSIS — I639 Cerebral infarction, unspecified: Secondary | ICD-10-CM | POA: Diagnosis not present

## 2021-07-23 DIAGNOSIS — Z515 Encounter for palliative care: Secondary | ICD-10-CM | POA: Diagnosis not present

## 2021-08-03 DIAGNOSIS — R262 Difficulty in walking, not elsewhere classified: Secondary | ICD-10-CM | POA: Diagnosis not present

## 2021-08-03 DIAGNOSIS — R279 Unspecified lack of coordination: Secondary | ICD-10-CM | POA: Diagnosis not present

## 2021-08-03 DIAGNOSIS — M6281 Muscle weakness (generalized): Secondary | ICD-10-CM | POA: Diagnosis not present

## 2021-08-03 DIAGNOSIS — A419 Sepsis, unspecified organism: Secondary | ICD-10-CM | POA: Diagnosis not present

## 2021-08-04 DIAGNOSIS — A419 Sepsis, unspecified organism: Secondary | ICD-10-CM | POA: Diagnosis not present

## 2021-08-04 DIAGNOSIS — R279 Unspecified lack of coordination: Secondary | ICD-10-CM | POA: Diagnosis not present

## 2021-08-04 DIAGNOSIS — M6281 Muscle weakness (generalized): Secondary | ICD-10-CM | POA: Diagnosis not present

## 2021-08-04 DIAGNOSIS — R262 Difficulty in walking, not elsewhere classified: Secondary | ICD-10-CM | POA: Diagnosis not present

## 2021-08-05 DIAGNOSIS — R262 Difficulty in walking, not elsewhere classified: Secondary | ICD-10-CM | POA: Diagnosis not present

## 2021-08-05 DIAGNOSIS — M6281 Muscle weakness (generalized): Secondary | ICD-10-CM | POA: Diagnosis not present

## 2021-08-05 DIAGNOSIS — A419 Sepsis, unspecified organism: Secondary | ICD-10-CM | POA: Diagnosis not present

## 2021-08-05 DIAGNOSIS — R279 Unspecified lack of coordination: Secondary | ICD-10-CM | POA: Diagnosis not present

## 2021-08-06 DIAGNOSIS — A419 Sepsis, unspecified organism: Secondary | ICD-10-CM | POA: Diagnosis not present

## 2021-08-06 DIAGNOSIS — R279 Unspecified lack of coordination: Secondary | ICD-10-CM | POA: Diagnosis not present

## 2021-08-06 DIAGNOSIS — R262 Difficulty in walking, not elsewhere classified: Secondary | ICD-10-CM | POA: Diagnosis not present

## 2021-08-06 DIAGNOSIS — M6281 Muscle weakness (generalized): Secondary | ICD-10-CM | POA: Diagnosis not present

## 2021-08-09 DIAGNOSIS — R262 Difficulty in walking, not elsewhere classified: Secondary | ICD-10-CM | POA: Diagnosis not present

## 2021-08-09 DIAGNOSIS — M6281 Muscle weakness (generalized): Secondary | ICD-10-CM | POA: Diagnosis not present

## 2021-08-09 DIAGNOSIS — A419 Sepsis, unspecified organism: Secondary | ICD-10-CM | POA: Diagnosis not present

## 2021-08-09 DIAGNOSIS — R279 Unspecified lack of coordination: Secondary | ICD-10-CM | POA: Diagnosis not present

## 2021-08-10 DIAGNOSIS — R279 Unspecified lack of coordination: Secondary | ICD-10-CM | POA: Diagnosis not present

## 2021-08-10 DIAGNOSIS — A419 Sepsis, unspecified organism: Secondary | ICD-10-CM | POA: Diagnosis not present

## 2021-08-10 DIAGNOSIS — R262 Difficulty in walking, not elsewhere classified: Secondary | ICD-10-CM | POA: Diagnosis not present

## 2021-08-10 DIAGNOSIS — M6281 Muscle weakness (generalized): Secondary | ICD-10-CM | POA: Diagnosis not present

## 2021-08-11 DIAGNOSIS — R279 Unspecified lack of coordination: Secondary | ICD-10-CM | POA: Diagnosis not present

## 2021-08-11 DIAGNOSIS — M6281 Muscle weakness (generalized): Secondary | ICD-10-CM | POA: Diagnosis not present

## 2021-08-11 DIAGNOSIS — R262 Difficulty in walking, not elsewhere classified: Secondary | ICD-10-CM | POA: Diagnosis not present

## 2021-08-11 DIAGNOSIS — A419 Sepsis, unspecified organism: Secondary | ICD-10-CM | POA: Diagnosis not present

## 2021-08-12 DIAGNOSIS — R279 Unspecified lack of coordination: Secondary | ICD-10-CM | POA: Diagnosis not present

## 2021-08-12 DIAGNOSIS — R262 Difficulty in walking, not elsewhere classified: Secondary | ICD-10-CM | POA: Diagnosis not present

## 2021-08-12 DIAGNOSIS — A419 Sepsis, unspecified organism: Secondary | ICD-10-CM | POA: Diagnosis not present

## 2021-08-12 DIAGNOSIS — M6281 Muscle weakness (generalized): Secondary | ICD-10-CM | POA: Diagnosis not present

## 2021-08-13 DIAGNOSIS — R262 Difficulty in walking, not elsewhere classified: Secondary | ICD-10-CM | POA: Diagnosis not present

## 2021-08-13 DIAGNOSIS — A419 Sepsis, unspecified organism: Secondary | ICD-10-CM | POA: Diagnosis not present

## 2021-08-13 DIAGNOSIS — M6281 Muscle weakness (generalized): Secondary | ICD-10-CM | POA: Diagnosis not present

## 2021-08-13 DIAGNOSIS — R279 Unspecified lack of coordination: Secondary | ICD-10-CM | POA: Diagnosis not present

## 2021-08-15 DIAGNOSIS — R279 Unspecified lack of coordination: Secondary | ICD-10-CM | POA: Diagnosis not present

## 2021-08-15 DIAGNOSIS — A419 Sepsis, unspecified organism: Secondary | ICD-10-CM | POA: Diagnosis not present

## 2021-08-15 DIAGNOSIS — M6281 Muscle weakness (generalized): Secondary | ICD-10-CM | POA: Diagnosis not present

## 2021-08-15 DIAGNOSIS — R262 Difficulty in walking, not elsewhere classified: Secondary | ICD-10-CM | POA: Diagnosis not present

## 2021-08-16 DIAGNOSIS — M6281 Muscle weakness (generalized): Secondary | ICD-10-CM | POA: Diagnosis not present

## 2021-08-16 DIAGNOSIS — Z8673 Personal history of transient ischemic attack (TIA), and cerebral infarction without residual deficits: Secondary | ICD-10-CM | POA: Diagnosis not present

## 2021-08-16 DIAGNOSIS — R279 Unspecified lack of coordination: Secondary | ICD-10-CM | POA: Diagnosis not present

## 2021-08-16 DIAGNOSIS — R262 Difficulty in walking, not elsewhere classified: Secondary | ICD-10-CM | POA: Diagnosis not present

## 2021-08-16 DIAGNOSIS — R5381 Other malaise: Secondary | ICD-10-CM | POA: Diagnosis not present

## 2021-08-16 DIAGNOSIS — I639 Cerebral infarction, unspecified: Secondary | ICD-10-CM | POA: Diagnosis not present

## 2021-08-16 DIAGNOSIS — A419 Sepsis, unspecified organism: Secondary | ICD-10-CM | POA: Diagnosis not present

## 2021-08-16 DIAGNOSIS — Z515 Encounter for palliative care: Secondary | ICD-10-CM | POA: Diagnosis not present

## 2021-08-17 DIAGNOSIS — M6281 Muscle weakness (generalized): Secondary | ICD-10-CM | POA: Diagnosis not present

## 2021-08-17 DIAGNOSIS — R279 Unspecified lack of coordination: Secondary | ICD-10-CM | POA: Diagnosis not present

## 2021-08-17 DIAGNOSIS — R262 Difficulty in walking, not elsewhere classified: Secondary | ICD-10-CM | POA: Diagnosis not present

## 2021-08-17 DIAGNOSIS — A419 Sepsis, unspecified organism: Secondary | ICD-10-CM | POA: Diagnosis not present

## 2021-08-18 DIAGNOSIS — R279 Unspecified lack of coordination: Secondary | ICD-10-CM | POA: Diagnosis not present

## 2021-08-18 DIAGNOSIS — M6281 Muscle weakness (generalized): Secondary | ICD-10-CM | POA: Diagnosis not present

## 2021-08-18 DIAGNOSIS — R262 Difficulty in walking, not elsewhere classified: Secondary | ICD-10-CM | POA: Diagnosis not present

## 2021-08-18 DIAGNOSIS — A419 Sepsis, unspecified organism: Secondary | ICD-10-CM | POA: Diagnosis not present

## 2021-08-19 DIAGNOSIS — A419 Sepsis, unspecified organism: Secondary | ICD-10-CM | POA: Diagnosis not present

## 2021-08-19 DIAGNOSIS — R262 Difficulty in walking, not elsewhere classified: Secondary | ICD-10-CM | POA: Diagnosis not present

## 2021-08-19 DIAGNOSIS — R279 Unspecified lack of coordination: Secondary | ICD-10-CM | POA: Diagnosis not present

## 2021-08-19 DIAGNOSIS — M6281 Muscle weakness (generalized): Secondary | ICD-10-CM | POA: Diagnosis not present

## 2021-08-20 DIAGNOSIS — M6281 Muscle weakness (generalized): Secondary | ICD-10-CM | POA: Diagnosis not present

## 2021-08-20 DIAGNOSIS — A419 Sepsis, unspecified organism: Secondary | ICD-10-CM | POA: Diagnosis not present

## 2021-08-20 DIAGNOSIS — R279 Unspecified lack of coordination: Secondary | ICD-10-CM | POA: Diagnosis not present

## 2021-08-20 DIAGNOSIS — R262 Difficulty in walking, not elsewhere classified: Secondary | ICD-10-CM | POA: Diagnosis not present

## 2021-08-23 DIAGNOSIS — A419 Sepsis, unspecified organism: Secondary | ICD-10-CM | POA: Diagnosis not present

## 2021-08-23 DIAGNOSIS — M6281 Muscle weakness (generalized): Secondary | ICD-10-CM | POA: Diagnosis not present

## 2021-08-23 DIAGNOSIS — R279 Unspecified lack of coordination: Secondary | ICD-10-CM | POA: Diagnosis not present

## 2021-08-23 DIAGNOSIS — R195 Other fecal abnormalities: Secondary | ICD-10-CM | POA: Diagnosis not present

## 2021-08-23 DIAGNOSIS — R262 Difficulty in walking, not elsewhere classified: Secondary | ICD-10-CM | POA: Diagnosis not present

## 2021-08-24 DIAGNOSIS — A419 Sepsis, unspecified organism: Secondary | ICD-10-CM | POA: Diagnosis not present

## 2021-08-24 DIAGNOSIS — M6281 Muscle weakness (generalized): Secondary | ICD-10-CM | POA: Diagnosis not present

## 2021-08-24 DIAGNOSIS — R262 Difficulty in walking, not elsewhere classified: Secondary | ICD-10-CM | POA: Diagnosis not present

## 2021-08-24 DIAGNOSIS — R279 Unspecified lack of coordination: Secondary | ICD-10-CM | POA: Diagnosis not present

## 2021-08-25 ENCOUNTER — Telehealth: Payer: Self-pay | Admitting: *Deleted

## 2021-08-25 ENCOUNTER — Ambulatory Visit (INDEPENDENT_AMBULATORY_CARE_PROVIDER_SITE_OTHER): Payer: Medicare HMO | Admitting: Internal Medicine

## 2021-08-25 ENCOUNTER — Other Ambulatory Visit: Payer: Self-pay

## 2021-08-25 VITALS — BP 102/68 | HR 71 | Temp 97.1°F | Ht 63.0 in | Wt 138.2 lb

## 2021-08-25 DIAGNOSIS — D509 Iron deficiency anemia, unspecified: Secondary | ICD-10-CM

## 2021-08-25 DIAGNOSIS — R262 Difficulty in walking, not elsewhere classified: Secondary | ICD-10-CM | POA: Diagnosis not present

## 2021-08-25 DIAGNOSIS — A419 Sepsis, unspecified organism: Secondary | ICD-10-CM | POA: Diagnosis not present

## 2021-08-25 DIAGNOSIS — K5900 Constipation, unspecified: Secondary | ICD-10-CM | POA: Diagnosis not present

## 2021-08-25 DIAGNOSIS — R279 Unspecified lack of coordination: Secondary | ICD-10-CM | POA: Diagnosis not present

## 2021-08-25 DIAGNOSIS — Z79899 Other long term (current) drug therapy: Secondary | ICD-10-CM | POA: Diagnosis not present

## 2021-08-25 DIAGNOSIS — R7989 Other specified abnormal findings of blood chemistry: Secondary | ICD-10-CM

## 2021-08-25 DIAGNOSIS — M6281 Muscle weakness (generalized): Secondary | ICD-10-CM | POA: Diagnosis not present

## 2021-08-25 MED ORDER — PEG 3350-KCL-NA BICARB-NACL 420 G PO SOLR: ORAL | 0 refills | Status: DC

## 2021-08-25 NOTE — Patient Instructions (Signed)
We will schedule you for upper endoscopy and colonoscopy to further evaluate your iron deficiency anemia. ? ?You will need to hold your Plavix x5 days prior to procedure. ? ?I am going to check blood work today including CBC and CMP at [facility]. ? ?Continue MiraLAX as needed for chronic constipation. ? ?Further recommendations to follow. ? ?It was nice seeing you again today. ? ?Dr. Abbey Chatters ?

## 2021-08-25 NOTE — Telephone Encounter (Signed)
[place] spoke with Amy. Patient scheduled for TCS/EGD with propofol asa 3 on 3/9 at 11:45am. Aware will send rx to pharmacy and fax instructions with pre-op appt to her at [phone]. Rx needs to be sent to [facility] ?

## 2021-08-25 NOTE — Progress Notes (Signed)
Referring Provider: No ref. provider found Primary Care Physician:  Patient, No Pcp Per (Inactive) Primary GI:  Dr. Abbey Chatters  Chief Complaint  Patient presents with   Follow-up    HPI:   George Valdez is a 66 y.o. male who presents to the clinic today for hospital follow-up visit.  Recently admitted to our local facility 07/10/2021 after being found to debilitated conditions without oral intake for several days and multiple falls.  Complicated past medical history including CAD, PAD, prior stroke, CKD.  He was admitted with streptococcal G sepsis, rhabdomyolysis, acute on chronic kidney disease, multiple metabolic derangements in setting of dehydration and failure to thrive.  Echo with possible mitral valve vegetation with consideration for TEE if repeat blood culture reports are positive per ID consultation.   GI was consulted for anemia.   On Plavix and aspirin.  Found to have hemoglobin of 8.3, hemoglobin 13.6 in 2021.  Hemoglobin down to 6.7 January 15.  Ferritin 124, iron 11, iron saturations 4%.  B12 normal, vitamin B1 305.2.  Received 2 units PRBCs. No overt GI bleeding during  admission.  Per patient, last colonoscopy/EGD about 8 years ago in [place], [place]  Also admitted with elevated transaminases, previously normal in 2018.  These improved with supportive care.  Serological work-up unremarkable.  CT abdomen pelvis unremarkable from a liver/gallbladder standpoint.  Did not experience any cystometric dysfunction during hospitalization.  Did have noted fecal impaction on arrival as well which improved with suppositories and MiraLAX.  Constipation now well controlled on MiraLAX as needed.  Currently resident at [facility].  Past Medical History:  Diagnosis Date   Alcohol abuse    Anxiety    BPH (benign prostatic hyperplasia)    Chronic pain    Coronary artery disease    Depression    Frequent falls    Hyperlipemia    Hypertension    Neuropathy    Pernicious anemia     Stroke ([place])    left sided weakness    Past Surgical History:  Procedure Laterality Date   CARDIAC CATHETERIZATION     CAROTID ENDARTERECTOMY      Current Outpatient Medications  Medication Sig Dispense Refill   albuterol (PROVENTIL HFA;VENTOLIN HFA) 108 (90 BASE) MCG/ACT inhaler Inhale 2 puffs into the lungs every 6 (six) hours as needed for wheezing or shortness of breath.     ALPRAZolam (XANAX) 1 MG tablet Take 0.5 tablets (0.5 mg total) by mouth every 8 (eight) hours as needed for anxiety. 10 tablet 0   aspirin EC 81 MG tablet Take 81 mg by mouth daily. Reported on 11/16/2015     clopidogrel (PLAVIX) 75 MG tablet Take 1 tablet (75 mg total) by mouth daily.     cyanocobalamin (,VITAMIN B-12,) 1000 MCG/ML injection Inject 1,000 mcg into the muscle every 30 (thirty) days.     DULoxetine (CYMBALTA) 60 MG capsule Take 60 mg by mouth daily.     ezetimibe (ZETIA) 10 MG tablet Take 1 tablet (10 mg total) by mouth daily. 90 tablet 3   feeding supplement (ENSURE ENLIVE / ENSURE PLUS) LIQD Take 237 mLs by mouth 3 (three) times daily between meals.     metoprolol succinate (TOPROL-XL) 25 MG 24 hr tablet Take 0.5 tablets (12.5 mg total) by mouth 2 (two) times daily. Take with or immediately following a meal.     Multiple Vitamin (MULTIVITAMIN WITH MINERALS) TABS tablet Take 1 tablet by mouth daily.     pantoprazole (PROTONIX)  40 MG tablet Take 1 tablet (40 mg total) by mouth daily. 30 tablet 3   polyethylene glycol (MIRALAX / GLYCOLAX) 17 g packet Take 17 g by mouth 2 (two) times daily.     potassium chloride 20 MEQ TBCR Take 20 mEq by mouth daily. 30 tablet 0   rosuvastatin (CRESTOR) 40 MG tablet Take 40 mg by mouth daily.     thiamine 100 MG tablet Take 1 tablet (100 mg total) by mouth daily. 30 tablet 0   umeclidinium bromide (INCRUSE ELLIPTA) 62.5 MCG/ACT AEPB Inhale 1 puff into the lungs daily.     vitamin E 400 UNIT capsule Take 400 Units by mouth daily.     No current facility-administered  medications for this visit.    Allergies as of 08/25/2021 - Review Complete 08/25/2021  Allergen Reaction Noted   Codeine Itching 09/05/2014    Family History  Problem Relation Age of Onset   Congestive Heart Failure Mother 22   Heart attack Father 66   Congestive Heart Failure Sister    Colon cancer Neg Hx    Colon polyps Neg Hx     Social History   Socioeconomic History   Marital status: Married    Spouse name: Not on file   Number of children: Not on file   Years of education: Not on file   Highest education level: Not on file  Occupational History   Not on file  Tobacco Use   Smoking status: Every Day    Packs/day: 1.00    Years: 40.00    Pack years: 40.00    Types: Cigarettes   Smokeless tobacco: Never   Tobacco comments:    down to 5 cigarettes/day from 1.5 PPD  Vaping Use   Vaping Use: Never used  Substance and Sexual Activity   Alcohol use: Not Currently    Alcohol/week: 12.0 standard drinks    Types: 12 Standard drinks or equivalent per week    Comment: denies   Drug use: No   Sexual activity: Not on file  Other Topics Concern   Not on file  Social History Narrative   Not on file   Social Determinants of Health   Financial Resource Strain: Not on file  Food Insecurity: Not on file  Transportation Needs: Not on file  Physical Activity: Not on file  Stress: Not on file  Social Connections: Not on file    Subjective: Review of Systems  Constitutional:  Negative for chills and fever.  HENT:  Negative for congestion and hearing loss.   Eyes:  Negative for blurred vision and double vision.  Respiratory:  Negative for cough and shortness of breath.   Cardiovascular:  Negative for chest pain and palpitations.  Gastrointestinal:  Negative for abdominal pain, blood in stool, constipation, diarrhea, heartburn, melena and vomiting.  Genitourinary:  Negative for dysuria and urgency.  Musculoskeletal:  Negative for joint pain and myalgias.  Skin:   Negative for itching and rash.  Neurological:  Negative for dizziness and headaches.  Psychiatric/Behavioral:  Negative for depression. The patient is not nervous/anxious.     Objective: BP 102/68    Pulse 71    Temp (!) 97.1 F (36.2 C)    Ht 5\' 3"  (1.6 m)    Wt 138 lb 3.2 oz (62.7 kg)    BMI 24.48 kg/m  Physical Exam Constitutional:      Appearance: Normal appearance.     Comments: Patient in wheelchair  HENT:     Head:  Normocephalic and atraumatic.  Eyes:     Extraocular Movements: Extraocular movements intact.     Conjunctiva/sclera: Conjunctivae normal.  Cardiovascular:     Rate and Rhythm: Normal rate and regular rhythm.  Pulmonary:     Effort: Pulmonary effort is normal.     Breath sounds: Normal breath sounds.  Abdominal:     General: Bowel sounds are normal.     Palpations: Abdomen is soft.  Musculoskeletal:        General: Normal range of motion.     Cervical back: Normal range of motion and neck supple.  Skin:    General: Skin is warm.  Neurological:     General: No focal deficit present.     Mental Status: He is alert and oriented to person, place, and time.  Psychiatric:        Mood and Affect: Mood normal.        Behavior: Behavior normal.     Assessment: *Iron deficiency anemia *Abnormal liver function test *Constipation-improved on MiraLAX  Plan: In regards to patient's iron deficiency anemia, Will schedule for EGD to evaluate for peptic ulcer disease, esophagitis, gastritis, H. Pylori, duodenitis, or other. Will also evaluate for esophageal stricture, Schatzki's ring, esophageal web or other.   At the same time we will perform colonoscopy to evaluate for lower causes of GI bleeding including hemorrhoids, diverticular, polyps, malignancy, AVMs, or other.  The risks including infection, bleed, or perforation as well as benefits, limitations, alternatives and imponderables have been reviewed with the patient. Potential for esophageal dilation, biopsy,  etc. have also been reviewed.  Questions have been answered. All parties agreeable.  Patient will need to hold his Plavix x5 days prior to procedure.  Discussed slight increased risk of stroke during this time and he understands and is agreeable.  Constipation improved on MiraLAX.  Okay to continue as needed.  In regards to his abnormal aminotransferases, previously normal in 2018. IgG WNL, ANA and ASMA negative.  Hepatitis B surface antigen negative, hepatitis C antibody negative, hepatitis A IgM negative, hepatitis B core IgM negative.  CT abdomen pelvis without contrast with unremarkable liver/gallbladder.  Abnormal LFTs were likely multifactorial in the setting of sepsis, hypotension, rhabdomyolysis during hospitalization.  We will recheck today.  I will also check CBC today as well given his anemia.  Blood work to be performed at [facility].  08/25/2021 11:51 AM   Disclaimer: This note was dictated with voice recognition software. Similar sounding words can inadvertently be transcribed and may not be corrected upon review.

## 2021-08-25 NOTE — H&P (View-Only) (Signed)
Referring Provider: No ref. provider found Primary Care Physician:  Patient, No Pcp Per (Inactive) Primary GI:  Dr. Abbey Chatters  Chief Complaint  Patient presents with   Follow-up    HPI:   George Valdez is a 66 y.o. male who presents to the clinic today for hospital follow-up visit.  Recently admitted to our local facility 07/10/2021 after being found to debilitated conditions without oral intake for several days and multiple falls.  Complicated past medical history including CAD, PAD, prior stroke, CKD.  He was admitted with streptococcal G sepsis, rhabdomyolysis, acute on chronic kidney disease, multiple metabolic derangements in setting of dehydration and failure to thrive.  Echo with possible mitral valve vegetation with consideration for TEE if repeat blood culture reports are positive per ID consultation.   GI was consulted for anemia.   On Plavix and aspirin.  Found to have hemoglobin of 8.3, hemoglobin 13.6 in 2021.  Hemoglobin down to 6.7 January 15.  Ferritin 124, iron 11, iron saturations 4%.  B12 normal, vitamin B1 305.2.  Received 2 units PRBCs. No overt GI bleeding during  admission.  Per patient, last colonoscopy/EGD about 8 years ago in [place], [place]  Also admitted with elevated transaminases, previously normal in 2018.  These improved with supportive care.  Serological work-up unremarkable.  CT abdomen pelvis unremarkable from a liver/gallbladder standpoint.  Did not experience any cystometric dysfunction during hospitalization.  Did have noted fecal impaction on arrival as well which improved with suppositories and MiraLAX.  Constipation now well controlled on MiraLAX as needed.  Currently resident at [facility].  Past Medical History:  Diagnosis Date   Alcohol abuse    Anxiety    BPH (benign prostatic hyperplasia)    Chronic pain    Coronary artery disease    Depression    Frequent falls    Hyperlipemia    Hypertension    Neuropathy    Pernicious anemia     Stroke ([place])    left sided weakness    Past Surgical History:  Procedure Laterality Date   CARDIAC CATHETERIZATION     CAROTID ENDARTERECTOMY      Current Outpatient Medications  Medication Sig Dispense Refill   albuterol (PROVENTIL HFA;VENTOLIN HFA) 108 (90 BASE) MCG/ACT inhaler Inhale 2 puffs into the lungs every 6 (six) hours as needed for wheezing or shortness of breath.     ALPRAZolam (XANAX) 1 MG tablet Take 0.5 tablets (0.5 mg total) by mouth every 8 (eight) hours as needed for anxiety. 10 tablet 0   aspirin EC 81 MG tablet Take 81 mg by mouth daily. Reported on 11/16/2015     clopidogrel (PLAVIX) 75 MG tablet Take 1 tablet (75 mg total) by mouth daily.     cyanocobalamin (,VITAMIN B-12,) 1000 MCG/ML injection Inject 1,000 mcg into the muscle every 30 (thirty) days.     DULoxetine (CYMBALTA) 60 MG capsule Take 60 mg by mouth daily.     ezetimibe (ZETIA) 10 MG tablet Take 1 tablet (10 mg total) by mouth daily. 90 tablet 3   feeding supplement (ENSURE ENLIVE / ENSURE PLUS) LIQD Take 237 mLs by mouth 3 (three) times daily between meals.     metoprolol succinate (TOPROL-XL) 25 MG 24 hr tablet Take 0.5 tablets (12.5 mg total) by mouth 2 (two) times daily. Take with or immediately following a meal.     Multiple Vitamin (MULTIVITAMIN WITH MINERALS) TABS tablet Take 1 tablet by mouth daily.     pantoprazole (PROTONIX)  40 MG tablet Take 1 tablet (40 mg total) by mouth daily. 30 tablet 3   polyethylene glycol (MIRALAX / GLYCOLAX) 17 g packet Take 17 g by mouth 2 (two) times daily.     potassium chloride 20 MEQ TBCR Take 20 mEq by mouth daily. 30 tablet 0   rosuvastatin (CRESTOR) 40 MG tablet Take 40 mg by mouth daily.     thiamine 100 MG tablet Take 1 tablet (100 mg total) by mouth daily. 30 tablet 0   umeclidinium bromide (INCRUSE ELLIPTA) 62.5 MCG/ACT AEPB Inhale 1 puff into the lungs daily.     vitamin E 400 UNIT capsule Take 400 Units by mouth daily.     No current facility-administered  medications for this visit.    Allergies as of 08/25/2021 - Review Complete 08/25/2021  Allergen Reaction Noted   Codeine Itching 09/05/2014    Family History  Problem Relation Age of Onset   Congestive Heart Failure Mother 96   Heart attack Father 80   Congestive Heart Failure Sister    Colon cancer Neg Hx    Colon polyps Neg Hx     Social History   Socioeconomic History   Marital status: Married    Spouse name: Not on file   Number of children: Not on file   Years of education: Not on file   Highest education level: Not on file  Occupational History   Not on file  Tobacco Use   Smoking status: Every Day    Packs/day: 1.00    Years: 40.00    Pack years: 40.00    Types: Cigarettes   Smokeless tobacco: Never   Tobacco comments:    down to 5 cigarettes/day from 1.5 PPD  Vaping Use   Vaping Use: Never used  Substance and Sexual Activity   Alcohol use: Not Currently    Alcohol/week: 12.0 standard drinks    Types: 12 Standard drinks or equivalent per week    Comment: denies   Drug use: No   Sexual activity: Not on file  Other Topics Concern   Not on file  Social History Narrative   Not on file   Social Determinants of Health   Financial Resource Strain: Not on file  Food Insecurity: Not on file  Transportation Needs: Not on file  Physical Activity: Not on file  Stress: Not on file  Social Connections: Not on file    Subjective: Review of Systems  Constitutional:  Negative for chills and fever.  HENT:  Negative for congestion and hearing loss.   Eyes:  Negative for blurred vision and double vision.  Respiratory:  Negative for cough and shortness of breath.   Cardiovascular:  Negative for chest pain and palpitations.  Gastrointestinal:  Negative for abdominal pain, blood in stool, constipation, diarrhea, heartburn, melena and vomiting.  Genitourinary:  Negative for dysuria and urgency.  Musculoskeletal:  Negative for joint pain and myalgias.  Skin:   Negative for itching and rash.  Neurological:  Negative for dizziness and headaches.  Psychiatric/Behavioral:  Negative for depression. The patient is not nervous/anxious.     Objective: BP 102/68    Pulse 71    Temp (!) 97.1 F (36.2 C)    Ht 5\' 3"  (1.6 m)    Wt 138 lb 3.2 oz (62.7 kg)    BMI 24.48 kg/m  Physical Exam Constitutional:      Appearance: Normal appearance.     Comments: Patient in wheelchair  HENT:     Head:  Normocephalic and atraumatic.  Eyes:     Extraocular Movements: Extraocular movements intact.     Conjunctiva/sclera: Conjunctivae normal.  Cardiovascular:     Rate and Rhythm: Normal rate and regular rhythm.  Pulmonary:     Effort: Pulmonary effort is normal.     Breath sounds: Normal breath sounds.  Abdominal:     General: Bowel sounds are normal.     Palpations: Abdomen is soft.  Musculoskeletal:        General: Normal range of motion.     Cervical back: Normal range of motion and neck supple.  Skin:    General: Skin is warm.  Neurological:     General: No focal deficit present.     Mental Status: He is alert and oriented to person, place, and time.  Psychiatric:        Mood and Affect: Mood normal.        Behavior: Behavior normal.     Assessment: *Iron deficiency anemia *Abnormal liver function test *Constipation-improved on MiraLAX  Plan: In regards to patient's iron deficiency anemia, Will schedule for EGD to evaluate for peptic ulcer disease, esophagitis, gastritis, H. Pylori, duodenitis, or other. Will also evaluate for esophageal stricture, Schatzki's ring, esophageal web or other.   At the same time we will perform colonoscopy to evaluate for lower causes of GI bleeding including hemorrhoids, diverticular, polyps, malignancy, AVMs, or other.  The risks including infection, bleed, or perforation as well as benefits, limitations, alternatives and imponderables have been reviewed with the patient. Potential for esophageal dilation, biopsy,  etc. have also been reviewed.  Questions have been answered. All parties agreeable.  Patient will need to hold his Plavix x5 days prior to procedure.  Discussed slight increased risk of stroke during this time and he understands and is agreeable.  Constipation improved on MiraLAX.  Okay to continue as needed.  In regards to his abnormal aminotransferases, previously normal in 2018. IgG WNL, ANA and ASMA negative.  Hepatitis B surface antigen negative, hepatitis C antibody negative, hepatitis A IgM negative, hepatitis B core IgM negative.  CT abdomen pelvis without contrast with unremarkable liver/gallbladder.  Abnormal LFTs were likely multifactorial in the setting of sepsis, hypotension, rhabdomyolysis during hospitalization.  We will recheck today.  I will also check CBC today as well given his anemia.  Blood work to be performed at [facility].  08/25/2021 11:51 AM   Disclaimer: This note was dictated with voice recognition software. Similar sounding words can inadvertently be transcribed and may not be corrected upon review.

## 2021-08-26 ENCOUNTER — Encounter: Payer: Self-pay | Admitting: *Deleted

## 2021-08-26 DIAGNOSIS — M6281 Muscle weakness (generalized): Secondary | ICD-10-CM | POA: Diagnosis not present

## 2021-08-26 DIAGNOSIS — R7989 Other specified abnormal findings of blood chemistry: Secondary | ICD-10-CM | POA: Diagnosis not present

## 2021-08-26 DIAGNOSIS — R279 Unspecified lack of coordination: Secondary | ICD-10-CM | POA: Diagnosis not present

## 2021-08-26 DIAGNOSIS — A419 Sepsis, unspecified organism: Secondary | ICD-10-CM | POA: Diagnosis not present

## 2021-08-26 DIAGNOSIS — R262 Difficulty in walking, not elsewhere classified: Secondary | ICD-10-CM | POA: Diagnosis not present

## 2021-08-26 DIAGNOSIS — D509 Iron deficiency anemia, unspecified: Secondary | ICD-10-CM | POA: Diagnosis not present

## 2021-08-26 NOTE — Patient Instructions (Signed)
George Valdez  08/26/2021     @PREFPERIOPPHARMACY @   Your procedure is scheduled on  09/02/2021.   Report to [facility] at  [place].M.   Call this number if you have problems the morning of surgery:  [phone]   Remember:  Follow the diet and prep instructions given to you by the office.    Your last dose of plavix should be on 08/28/2021.     Use your inhalers before you come and bring your rescue inhaler with you.      Take these medicines the morning of surgery with A SIP OF WATER                 xanax(if needed), cymbalta, metoprolol, protonix.      Do not wear jewelry, make-up or nail polish.  Do not wear lotions, powders, or perfumes, or deodorant.  Do not shave 48 hours prior to surgery.  Men may shave face and neck.  Do not bring valuables to the hospital.  [facility] is not responsible for any belongings or valuables.  Contacts, dentures or bridgework may not be worn into surgery.  Leave your suitcase in the car.  After surgery it may be brought to your room.  For patients admitted to the hospital, discharge time will be determined by your treatment team.  Patients discharged the day of surgery will not be allowed to drive home and must have someone with them for 24 hours.    Special instructions:   DO NOT smoke tobacco or vape for 24 hours before your procedure.  Please read over the following fact sheets that you were given. Anesthesia Post-op Instructions and Care and Recovery After Surgery      Upper Endoscopy, Adult, Care After This sheet gives you information about how to care for yourself after your procedure. Your health care provider may also give you more specific instructions. If you have problems or questions, contact your health care provider. What can I expect after the procedure? After the procedure, it is common to have: A sore throat. Mild stomach pain or discomfort. Bloating. Nausea. Follow these instructions at  home:  Follow instructions from your health care provider about what to eat or drink after your procedure. Return to your normal activities as told by your health care provider. Ask your health care provider what activities are safe for you. Take over-the-counter and prescription medicines only as told by your health care provider. If you were given a sedative during the procedure, it can affect you for several hours. Do not drive or operate machinery until your health care provider says that it is safe. Keep all follow-up visits as told by your health care provider. This is important. Contact a health care provider if you have: A sore throat that lasts longer than one day. Trouble swallowing. Get help right away if: You vomit blood or your vomit looks like coffee grounds. You have: A fever. Bloody, black, or tarry stools. A severe sore throat or you cannot swallow. Difficulty breathing. Severe pain in your chest or abdomen. Summary After the procedure, it is common to have a sore throat, mild stomach discomfort, bloating, and nausea. If you were given a sedative during the procedure, it can affect you for several hours. Do not drive or operate machinery until your health care provider says that it is safe. Follow instructions from your health care provider about what to eat or drink  after your procedure. Return to your normal activities as told by your health care provider. This information is not intended to replace advice given to you by your health care provider. Make sure you discuss any questions you have with your health care provider. Document Revised: 04/19/2019 Document Reviewed: 11/13/2017 Elsevier Patient Education  2022 [place]. Colonoscopy, Adult, Care After This sheet gives you information about how to care for yourself after your procedure. Your health care provider may also give you more specific instructions. If you have problems or questions, contact your health  care provider. What can I expect after the procedure? After the procedure, it is common to have: A small amount of blood in your stool for 24 hours after the procedure. Some gas. Mild cramping or bloating of your abdomen. Follow these instructions at home: Eating and drinking  Drink enough fluid to keep your urine pale yellow. Follow instructions from your health care provider about eating or drinking restrictions. Resume your normal diet as instructed by your health care provider. Avoid heavy or fried foods that are hard to digest. Activity Rest as told by your health care provider. Avoid sitting for a long time without moving. Get up to take short walks every 1-2 hours. This is important to improve blood flow and breathing. Ask for help if you feel weak or unsteady. Return to your normal activities as told by your health care provider. Ask your health care provider what activities are safe for you. Managing cramping and bloating  Try walking around when you have cramps or feel bloated. Apply heat to your abdomen as told by your health care provider. Use the heat source that your health care provider recommends, such as a moist heat pack or a heating pad. Place a towel between your skin and the heat source. Leave the heat on for 20-30 minutes. Remove the heat if your skin turns bright red. This is especially important if you are unable to feel pain, heat, or cold. You may have a greater risk of getting burned. General instructions If you were given a sedative during the procedure, it can affect you for several hours. Do not drive or operate machinery until your health care provider says that it is safe. For the first 24 hours after the procedure: Do not sign important documents. Do not drink alcohol. Do your regular daily activities at a slower pace than normal. Eat soft foods that are easy to digest. Take over-the-counter and prescription medicines only as told by your health care  provider. Keep all follow-up visits as told by your health care provider. This is important. Contact a health care provider if: You have blood in your stool 2-3 days after the procedure. Get help right away if you have: More than a small spotting of blood in your stool. Large blood clots in your stool. Swelling of your abdomen. Nausea or vomiting. A fever. Increasing pain in your abdomen that is not relieved with medicine. Summary After the procedure, it is common to have a small amount of blood in your stool. You may also have mild cramping and bloating of your abdomen. If you were given a sedative during the procedure, it can affect you for several hours. Do not drive or operate machinery until your health care provider says that it is safe. Get help right away if you have a lot of blood in your stool, nausea or vomiting, a fever, or increased pain in your abdomen. This information is not intended to replace  advice given to you by your health care provider. Make sure you discuss any questions you have with your health care provider. Document Revised: 04/19/2019 Document Reviewed: 01/07/2019 Elsevier Patient Education  [place] After This sheet gives you information about how to care for yourself after your procedure. Your health care provider may also give you more specific instructions. If you have problems or questions, contact your health care provider. What can I expect after the procedure? After the procedure, it is common to have: Tiredness. Forgetfulness about what happened after the procedure. Impaired judgment for important decisions. Nausea or vomiting. Some difficulty with balance. Follow these instructions at home: For the time period you were told by your health care provider:   Rest as needed. Do not participate in activities where you could fall or become injured. Do not drive or use machinery. Do not drink alcohol. Do not  take sleeping pills or medicines that cause drowsiness. Do not make important decisions or sign legal documents. Do not take care of children on your own. Eating and drinking Follow the diet that is recommended by your health care provider. Drink enough fluid to keep your urine pale yellow. If you vomit: Drink water, juice, or soup when you can drink without vomiting. Make sure you have little or no nausea before eating solid foods. General instructions Have a responsible adult stay with you for the time you are told. It is important to have someone help care for you until you are awake and alert. Take over-the-counter and prescription medicines only as told by your health care provider. If you have sleep apnea, surgery and certain medicines can increase your risk for breathing problems. Follow instructions from your health care provider about wearing your sleep device: Anytime you are sleeping, including during daytime naps. While taking prescription pain medicines, sleeping medicines, or medicines that make you drowsy. Avoid smoking. Keep all follow-up visits as told by your health care provider. This is important. Contact a health care provider if: You keep feeling nauseous or you keep vomiting. You feel light-headed. You are still sleepy or having trouble with balance after 24 hours. You develop a rash. You have a fever. You have redness or swelling around the IV site. Get help right away if: You have trouble breathing. You have new-onset confusion at home. Summary For several hours after your procedure, you may feel tired. You may also be forgetful and have poor judgment. Have a responsible adult stay with you for the time you are told. It is important to have someone help care for you until you are awake and alert. Rest as told. Do not drive or operate machinery. Do not drink alcohol or take sleeping pills. Get help right away if you have trouble breathing, or if you suddenly  become confused. This information is not intended to replace advice given to you by your health care provider. Make sure you discuss any questions you have with your health care provider. Document Revised: 02/27/2020 Document Reviewed: 05/16/2019 Elsevier Patient Education  2022 [employer].

## 2021-08-27 DIAGNOSIS — R279 Unspecified lack of coordination: Secondary | ICD-10-CM | POA: Diagnosis not present

## 2021-08-27 DIAGNOSIS — R262 Difficulty in walking, not elsewhere classified: Secondary | ICD-10-CM | POA: Diagnosis not present

## 2021-08-27 DIAGNOSIS — A419 Sepsis, unspecified organism: Secondary | ICD-10-CM | POA: Diagnosis not present

## 2021-08-27 DIAGNOSIS — M6281 Muscle weakness (generalized): Secondary | ICD-10-CM | POA: Diagnosis not present

## 2021-08-30 DIAGNOSIS — R262 Difficulty in walking, not elsewhere classified: Secondary | ICD-10-CM | POA: Diagnosis not present

## 2021-08-30 DIAGNOSIS — R279 Unspecified lack of coordination: Secondary | ICD-10-CM | POA: Diagnosis not present

## 2021-08-30 DIAGNOSIS — A419 Sepsis, unspecified organism: Secondary | ICD-10-CM | POA: Diagnosis not present

## 2021-08-30 DIAGNOSIS — M6281 Muscle weakness (generalized): Secondary | ICD-10-CM | POA: Diagnosis not present

## 2021-08-31 ENCOUNTER — Encounter (HOSPITAL_COMMUNITY)
Admission: RE | Admit: 2021-08-31 | Discharge: 2021-08-31 | Disposition: A | Discharge status: Home / Self Care | Payer: Medicare HMO | Source: Ambulatory Visit | Attending: Internal Medicine | Admitting: Internal Medicine

## 2021-08-31 ENCOUNTER — Encounter (HOSPITAL_COMMUNITY): Payer: Self-pay

## 2021-08-31 VITALS — BP 114/68 | HR 58 | Temp 97.1°F | Resp 18 | Ht 63.0 in | Wt 138.2 lb

## 2021-08-31 DIAGNOSIS — D510 Vitamin B12 deficiency anemia due to intrinsic factor deficiency: Secondary | ICD-10-CM | POA: Diagnosis not present

## 2021-08-31 DIAGNOSIS — M6281 Muscle weakness (generalized): Secondary | ICD-10-CM | POA: Diagnosis not present

## 2021-08-31 DIAGNOSIS — F1010 Alcohol abuse, uncomplicated: Secondary | ICD-10-CM | POA: Insufficient documentation

## 2021-08-31 DIAGNOSIS — D509 Iron deficiency anemia, unspecified: Secondary | ICD-10-CM | POA: Diagnosis not present

## 2021-08-31 DIAGNOSIS — R69 Illness, unspecified: Secondary | ICD-10-CM | POA: Diagnosis not present

## 2021-08-31 DIAGNOSIS — A419 Sepsis, unspecified organism: Secondary | ICD-10-CM | POA: Diagnosis not present

## 2021-08-31 DIAGNOSIS — R262 Difficulty in walking, not elsewhere classified: Secondary | ICD-10-CM | POA: Diagnosis not present

## 2021-08-31 DIAGNOSIS — R7989 Other specified abnormal findings of blood chemistry: Secondary | ICD-10-CM | POA: Diagnosis not present

## 2021-08-31 DIAGNOSIS — Z01812 Encounter for preprocedural laboratory examination: Secondary | ICD-10-CM | POA: Insufficient documentation

## 2021-08-31 DIAGNOSIS — R279 Unspecified lack of coordination: Secondary | ICD-10-CM | POA: Diagnosis not present

## 2021-08-31 HISTORY — DX: Unspecified asthma, uncomplicated: J45.909

## 2021-08-31 HISTORY — DX: Chronic obstructive pulmonary disease, unspecified: J44.9

## 2021-08-31 HISTORY — DX: Acute myocardial infarction, unspecified: I21.9

## 2021-08-31 LAB — CBC WITH DIFFERENTIAL/PLATELET
Abs Immature Granulocytes: 0.04 10*3/uL (ref 0.00–0.07)
Basophils Absolute: 0 10*3/uL (ref 0.0–0.1)
Basophils Relative: 1 %
Eosinophils Absolute: 0.3 10*3/uL (ref 0.0–0.5)
Eosinophils Relative: 3 %
HCT: 35.6 % — ABNORMAL LOW (ref 39.0–52.0)
Hemoglobin: 10.8 g/dL — ABNORMAL LOW (ref 13.0–17.0)
Immature Granulocytes: 1 %
Lymphocytes Relative: 12 %
Lymphs Abs: 0.9 10*3/uL (ref 0.7–4.0)
MCH: 26.4 pg (ref 26.0–34.0)
MCHC: 30.3 g/dL (ref 30.0–36.0)
MCV: 87 fL (ref 80.0–100.0)
Monocytes Absolute: 1.1 10*3/uL — ABNORMAL HIGH (ref 0.1–1.0)
Monocytes Relative: 15 %
Neutro Abs: 5.1 10*3/uL (ref 1.7–7.7)
Neutrophils Relative %: 68 %
Platelets: 405 10*3/uL — ABNORMAL HIGH (ref 150–400)
RBC: 4.09 MIL/uL — ABNORMAL LOW (ref 4.22–5.81)
RDW: 21.7 % — ABNORMAL HIGH (ref 11.5–15.5)
WBC: 7.4 10*3/uL (ref 4.0–10.5)
nRBC: 0 % (ref 0.0–0.2)

## 2021-08-31 LAB — COMPREHENSIVE METABOLIC PANEL
ALT: 40 U/L (ref 0–44)
AST: 36 U/L (ref 15–41)
Albumin: 2.9 g/dL — ABNORMAL LOW (ref 3.5–5.0)
Alkaline Phosphatase: 60 U/L (ref 38–126)
Anion gap: 6 (ref 5–15)
BUN: 17 mg/dL (ref 8–23)
CO2: 24 mmol/L (ref 22–32)
Calcium: 8.8 mg/dL — ABNORMAL LOW (ref 8.9–10.3)
Chloride: 109 mmol/L (ref 98–111)
Creatinine, Ser: 1.14 mg/dL (ref 0.61–1.24)
GFR, Estimated: >60 mL/min (ref >60)
Glucose, Bld: 91 mg/dL (ref 70–99)
Potassium: 4.5 mmol/L (ref 3.5–5.1)
Sodium: 139 mmol/L (ref 135–145)
Total Bilirubin: 0.3 mg/dL (ref 0.3–1.2)
Total Protein: 6.6 g/dL (ref 6.5–8.1)

## 2021-09-01 DIAGNOSIS — R279 Unspecified lack of coordination: Secondary | ICD-10-CM | POA: Diagnosis not present

## 2021-09-01 DIAGNOSIS — A419 Sepsis, unspecified organism: Secondary | ICD-10-CM | POA: Diagnosis not present

## 2021-09-01 DIAGNOSIS — R262 Difficulty in walking, not elsewhere classified: Secondary | ICD-10-CM | POA: Diagnosis not present

## 2021-09-01 DIAGNOSIS — M6281 Muscle weakness (generalized): Secondary | ICD-10-CM | POA: Diagnosis not present

## 2021-09-02 ENCOUNTER — Other Ambulatory Visit: Payer: Self-pay

## 2021-09-02 ENCOUNTER — Ambulatory Visit (HOSPITAL_COMMUNITY): Payer: Medicare HMO | Admitting: Certified Registered"

## 2021-09-02 ENCOUNTER — Encounter (HOSPITAL_COMMUNITY): Payer: Self-pay

## 2021-09-02 ENCOUNTER — Ambulatory Visit (HOSPITAL_BASED_OUTPATIENT_CLINIC_OR_DEPARTMENT_OTHER): Payer: Medicare HMO | Admitting: Certified Registered"

## 2021-09-02 ENCOUNTER — Encounter (HOSPITAL_COMMUNITY)
Admission: RE | Disposition: A | Discharge status: Skilled Nursing Facility | Payer: Self-pay | Source: Home / Self Care | Attending: Internal Medicine

## 2021-09-02 ENCOUNTER — Ambulatory Visit (HOSPITAL_COMMUNITY)
Admission: RE | Admit: 2021-09-02 | Discharge: 2021-09-02 | Disposition: A | Discharge status: Skilled Nursing Facility | Payer: Medicare HMO | Attending: Internal Medicine | Admitting: Internal Medicine

## 2021-09-02 DIAGNOSIS — I700 Atherosclerosis of aorta: Secondary | ICD-10-CM | POA: Insufficient documentation

## 2021-09-02 DIAGNOSIS — K3189 Other diseases of stomach and duodenum: Secondary | ICD-10-CM | POA: Insufficient documentation

## 2021-09-02 DIAGNOSIS — R262 Difficulty in walking, not elsewhere classified: Secondary | ICD-10-CM | POA: Diagnosis not present

## 2021-09-02 DIAGNOSIS — I2510 Atherosclerotic heart disease of native coronary artery without angina pectoris: Secondary | ICD-10-CM | POA: Insufficient documentation

## 2021-09-02 DIAGNOSIS — K6389 Other specified diseases of intestine: Secondary | ICD-10-CM | POA: Insufficient documentation

## 2021-09-02 DIAGNOSIS — M6281 Muscle weakness (generalized): Secondary | ICD-10-CM | POA: Diagnosis not present

## 2021-09-02 DIAGNOSIS — D509 Iron deficiency anemia, unspecified: Secondary | ICD-10-CM | POA: Insufficient documentation

## 2021-09-02 DIAGNOSIS — I252 Old myocardial infarction: Secondary | ICD-10-CM | POA: Insufficient documentation

## 2021-09-02 DIAGNOSIS — K635 Polyp of colon: Secondary | ICD-10-CM

## 2021-09-02 DIAGNOSIS — I129 Hypertensive chronic kidney disease with stage 1 through stage 4 chronic kidney disease, or unspecified chronic kidney disease: Secondary | ICD-10-CM | POA: Insufficient documentation

## 2021-09-02 DIAGNOSIS — R69 Illness, unspecified: Secondary | ICD-10-CM | POA: Diagnosis not present

## 2021-09-02 DIAGNOSIS — R279 Unspecified lack of coordination: Secondary | ICD-10-CM | POA: Diagnosis not present

## 2021-09-02 DIAGNOSIS — F17210 Nicotine dependence, cigarettes, uncomplicated: Secondary | ICD-10-CM | POA: Insufficient documentation

## 2021-09-02 DIAGNOSIS — D124 Benign neoplasm of descending colon: Secondary | ICD-10-CM | POA: Diagnosis not present

## 2021-09-02 DIAGNOSIS — K529 Noninfective gastroenteritis and colitis, unspecified: Secondary | ICD-10-CM | POA: Diagnosis not present

## 2021-09-02 DIAGNOSIS — N189 Chronic kidney disease, unspecified: Secondary | ICD-10-CM | POA: Insufficient documentation

## 2021-09-02 DIAGNOSIS — K5730 Diverticulosis of large intestine without perforation or abscess without bleeding: Secondary | ICD-10-CM | POA: Diagnosis not present

## 2021-09-02 DIAGNOSIS — K648 Other hemorrhoids: Secondary | ICD-10-CM | POA: Diagnosis not present

## 2021-09-02 DIAGNOSIS — Z7902 Long term (current) use of antithrombotics/antiplatelets: Secondary | ICD-10-CM | POA: Insufficient documentation

## 2021-09-02 DIAGNOSIS — F419 Anxiety disorder, unspecified: Secondary | ICD-10-CM | POA: Insufficient documentation

## 2021-09-02 DIAGNOSIS — A419 Sepsis, unspecified organism: Secondary | ICD-10-CM | POA: Diagnosis not present

## 2021-09-02 DIAGNOSIS — J449 Chronic obstructive pulmonary disease, unspecified: Secondary | ICD-10-CM | POA: Insufficient documentation

## 2021-09-02 DIAGNOSIS — F32A Depression, unspecified: Secondary | ICD-10-CM | POA: Insufficient documentation

## 2021-09-02 DIAGNOSIS — Z79899 Other long term (current) drug therapy: Secondary | ICD-10-CM | POA: Insufficient documentation

## 2021-09-02 HISTORY — PX: COLONOSCOPY WITH PROPOFOL: SHX5780

## 2021-09-02 HISTORY — PX: BIOPSY: SHX5522

## 2021-09-02 HISTORY — PX: POLYPECTOMY: SHX5525

## 2021-09-02 HISTORY — PX: ESOPHAGOGASTRODUODENOSCOPY (EGD) WITH PROPOFOL: SHX5813

## 2021-09-02 SURGERY — COLONOSCOPY WITH PROPOFOL
Anesthesia: General

## 2021-09-02 MED ORDER — PROPOFOL 10 MG/ML IV BOLUS
INTRAVENOUS | Status: DC | PRN
  Administered 2021-09-02: 50 mg via INTRAVENOUS
  Administered 2021-09-02: 100 mg via INTRAVENOUS
  Administered 2021-09-02: 50 mg via INTRAVENOUS

## 2021-09-02 MED ORDER — EPHEDRINE SULFATE-NACL 50-0.9 MG/10ML-% IV SOSY
PREFILLED_SYRINGE | INTRAVENOUS | Status: DC | PRN
  Administered 2021-09-02: 5 mg via INTRAVENOUS

## 2021-09-02 MED ORDER — LIDOCAINE HCL (CARDIAC) PF 100 MG/5ML IV SOSY
PREFILLED_SYRINGE | INTRAVENOUS | Status: DC | PRN
  Administered 2021-09-02: 50 mg via INTRAVENOUS

## 2021-09-02 MED ORDER — LACTATED RINGERS IV SOLN: INTRAVENOUS | Status: DC

## 2021-09-02 MED ORDER — PROPOFOL 500 MG/50ML IV EMUL
INTRAVENOUS | Status: DC | PRN
  Administered 2021-09-02: 150 ug/kg/min via INTRAVENOUS

## 2021-09-02 MED ORDER — PHENYLEPHRINE HCL (PRESSORS) 10 MG/ML IV SOLN
INTRAVENOUS | Status: AC
  Filled 2021-09-02: qty 1

## 2021-09-02 MED ORDER — PHENYLEPHRINE HCL (PRESSORS) 10 MG/ML IV SOLN
INTRAVENOUS | Status: DC | PRN
  Administered 2021-09-02: 200 ug via INTRAVENOUS
  Administered 2021-09-02: 100 ug via INTRAVENOUS
  Administered 2021-09-02 (×2): 200 ug via INTRAVENOUS

## 2021-09-02 NOTE — Progress Notes (Signed)
Report called to [facility] Nurse. [facility] by Marlaine Hind in facility [place] ?

## 2021-09-02 NOTE — Anesthesia Procedure Notes (Signed)
Date/Time: 09/02/2021 10:30 AM ?Performed by: Orlie Dakin, CRNA ?Pre-anesthesia Checklist: Patient identified, Emergency Drugs available, Suction available and Patient being monitored ?Patient Re-evaluated:Patient Re-evaluated prior to induction ?Oxygen Delivery Method: Nasal cannula ?Induction Type: IV induction ?Placement Confirmation: positive ETCO2 ? ? ? ? ?

## 2021-09-02 NOTE — Op Note (Signed)
[facility] ?Patient Name: George Valdez ?Procedure Date: 09/02/2021 10:07 AM ?MRN: [id number] ?Date of Birth: February 21, 1956 ?Attending MD: Elon Alas. Abbey Chatters , DO ?CSN: [id number] ?Age: 66 ?Admit Type: Outpatient ?Procedure:                Upper GI endoscopy ?Indications:              Iron deficiency anemia ?Providers:                Elon Alas. Abbey Chatters, DO, Hughie Closs RN, RN, Tammy  ?                          Vaught, RN, Minette Headland L. Risa Grill, Technician ?Referring MD:              ?Medicines:                See the Anesthesia note for documentation of the  ?                          administered medications ?Complications:            No immediate complications. ?Estimated Blood Loss:     Estimated blood loss: none. ?Procedure:                Pre-Anesthesia Assessment: ?                          - The anesthesia plan was to use monitored  ?                          anesthesia care (MAC). ?                          After obtaining informed consent, the endoscope was  ?                          passed under direct vision. Throughout the  ?                          procedure, the patient's blood pressure, pulse, and  ?                          oxygen saturations were monitored continuously. The  ?                          GIF-H190 ([id number]) scope was introduced through the  ?                          mouth, and advanced to the second part of duodenum.  ?                          The upper GI endoscopy was accomplished without  ?                          difficulty. The patient tolerated the procedure  ?  well. ?Scope In: 10:23:11 AM ?Scope Out: 10:24:58 AM ?Total Procedure Duration: 0 hours 1 minute 47 seconds  ?Findings: ?     The Z-line was regular and was found 39 cm from the incisors. ?     There is no endoscopic evidence of bleeding, areas of erosion,  ?     esophagitis, ulcerations or varices in the entire esophagus. ?     Localized mildly erythematous mucosa without bleeding was  found in the  ?     gastric antrum. ?     The duodenal bulb, first portion of the duodenum and second portion of  ?     the duodenum were normal. ?Impression:               - Z-line regular, 39 cm from the incisors. ?                          - Erythematous mucosa in the antrum. ?                          - Normal duodenal bulb, first portion of the  ?                          duodenum and second portion of the duodenum. ?                          - No specimens collected. ?Moderate Sedation: ?     Per Anesthesia Care ?Recommendation:           - Patient has a contact number available for  ?                          emergencies. The signs and symptoms of potential  ?                          delayed complications were discussed with the  ?                          patient. Return to normal activities tomorrow.  ?                          Written discharge instructions were provided to the  ?                          patient. ?                          - Resume previous diet. ?                          - Continue present medications. ?                          - Proceed with colonscopy. ?Procedure Code(s):        --- Professional --- ?                          [phone], Esophagogastroduodenoscopy, flexible,  ?  transoral; diagnostic, including collection of  ?                          specimen(s) by brushing or washing, when performed  ?                          (separate procedure) ?Diagnosis Code(s):        --- Professional --- ?                          K31.89, Other diseases of stomach and duodenum ?                          D50.9, Iron deficiency anemia, unspecified ?CPT copyright 2019 American Medical Association. All rights reserved. ?The codes documented in this report are preliminary and upon coder review may  ?be revised to meet current compliance requirements. ?Elon Alas. Abbey Chatters, DO ?Elon Alas. [place], DO ?09/02/2021 10:26:55 AM ?This report has been signed electronically. ?Number of  Addenda: 0 ?

## 2021-09-02 NOTE — Interval H&P Note (Signed)
History and Physical Interval Note: ? ?09/02/2021 ?9:56 AM ? ?George Valdez  has presented today for surgery, with the diagnosis of ida.  The various methods of treatment have been discussed with the patient and family. After consideration of risks, benefits and other options for treatment, the patient has consented to  Procedure(s) with comments: ?COLONOSCOPY WITH PROPOFOL (N/A) - 11:45am ?ESOPHAGOGASTRODUODENOSCOPY (EGD) WITH PROPOFOL (N/A) as a surgical intervention.  The patient's history has been reviewed, patient examined, no change in status, stable for surgery.  I have reviewed the patient's chart and labs.  Questions were answered to the patient's satisfaction.   ? ? ?Eloise Harman ? ? ?

## 2021-09-02 NOTE — Anesthesia Postprocedure Evaluation (Signed)
Anesthesia Post Note ? ?Patient: George Valdez ? ?Procedure(s) Performed: COLONOSCOPY WITH PROPOFOL ?ESOPHAGOGASTRODUODENOSCOPY (EGD) WITH PROPOFOL ?BIOPSY ?POLYPECTOMY ? ?Patient location during evaluation: Phase II ?Anesthesia Type: General ?Level of consciousness: awake ?Pain management: pain level controlled ?Vital Signs Assessment: post-procedure vital signs reviewed and stable ?Respiratory status: spontaneous breathing and respiratory function stable ?Cardiovascular status: blood pressure returned to baseline and stable ?Postop Assessment: no headache and no apparent nausea or vomiting ?Anesthetic complications: no ?Comments: Late entry ? ? ?No notable events documented. ? ? ?Last Vitals:  ?Vitals:  ? 09/02/21 1000 09/02/21 1048  ?BP: (P) 94/70 (!) 96/56  ?Pulse:  73  ?Resp:  13  ?Temp:  36.7 ?C  ?SpO2:  98%  ?  ?Last Pain:  ?Vitals:  ? 09/02/21 1048  ?TempSrc: Axillary  ?PainSc: 0-No pain  ? ? ?  ?  ?  ?  ?  ?  ? ?Louann Sjogren ? ? ? ? ?

## 2021-09-02 NOTE — Op Note (Signed)
[facility] ?Patient Name: George Valdez ?Procedure Date: 09/02/2021 10:26 AM ?MRN: [id number] ?Date of Birth: Oct 26, 1955 ?Attending MD: Elon Alas. Abbey Chatters , DO ?CSN: [id number] ?Age: 66 ?Admit Type: Outpatient ?Procedure:                Colonoscopy ?Indications:              Iron deficiency anemia ?Providers:                Elon Alas. Abbey Chatters, DO, Hughie Closs RN, RN, Tammy  ?                          Vaught, RN, [employer], Technician ?Referring MD:              ?Medicines:                See the Anesthesia note for documentation of the  ?                          administered medications ?Complications:            No immediate complications. ?Estimated Blood Loss:     Estimated blood loss was minimal. ?Procedure:                Pre-Anesthesia Assessment: ?                          - The anesthesia plan was to use monitored  ?                          anesthesia care (MAC). ?                          After obtaining informed consent, the colonoscope  ?                          was passed under direct vision. Throughout the  ?                          procedure, the patient's blood pressure, pulse, and  ?                          oxygen saturations were monitored continuously. The  ?                          [id number]-[id number] ([id number]) was introduced through the  ?                          anus and advanced to the the cecum, identified by  ?                          appendiceal orifice and ileocecal valve. The  ?                          colonoscopy was performed without difficulty. The  ?                          patient tolerated the procedure  well. The quality  ?                          of the bowel preparation was evaluated using the  ?                          BBPS [facility] Bowel Preparation Scale) with scores  ?                          of: Right Colon = 2 (minor amount of residual  ?                          staining, small fragments of stool and/or opaque  ?                          liquid, but  mucosa seen well), Transverse Colon = 2  ?                          (minor amount of residual staining, small fragments  ?                          of stool and/or opaque liquid, but mucosa seen  ?                          well) and Left Colon = 2 (minor amount of residual  ?                          staining, small fragments of stool and/or opaque  ?                          liquid, but mucosa seen well). The total BBPS score  ?                          equals 6. The quality of the bowel preparation was  ?                          fair. ?Scope In: 10:28:24 AM ?Scope Out: 10:44:34 AM ?Scope Withdrawal Time: 0 hours 14 minutes 3 seconds  ?Total Procedure Duration: 0 hours 16 minutes 10 seconds  ?Findings: ?     Internal hemorrhoids were found during endoscopy. ?     Multiple small-mouthed diverticula were found in the sigmoid colon. ?     An area of nodular mucosa and cobblestoning was found in the transverse  ?     colon and in the ascending colon. Biopsies were taken with a cold  ?     forceps for histology. ?     Four sessile polyps were found in the sigmoid colon and descending  ?     colon. The polyps were 3 to 6 mm in size. These polyps were removed with  ?     a cold snare. Resection and retrieval were complete. ?     A 2 mm polyp was found in the descending colon. The polyp was sessile.  ?     The polyp was removed with a cold  biopsy forceps. Resection and  ?     retrieval were complete. ?     The exam was otherwise without abnormality. ?Impression:               - Preparation of the colon was fair. ?                          - Internal hemorrhoids. ?                          - Diverticulosis in the sigmoid colon. ?                          - Nodular mucosa in the transverse colon and in the  ?                          ascending colon. Biopsied. ?                          - Four 3 to 6 mm polyps in the sigmoid colon and in  ?                          the descending colon, removed with a cold snare.  ?                           Resected and retrieved. ?                          - One 2 mm polyp in the descending colon, removed  ?                          with a cold biopsy forceps. Resected and retrieved. ?                          - The examination was otherwise normal. ?Moderate Sedation: ?     Per Anesthesia Care ?Recommendation:           - Patient has a contact number available for  ?                          emergencies. The signs and symptoms of potential  ?                          delayed complications were discussed with the  ?                          patient. Return to normal activities tomorrow.  ?                          Written discharge instructions were provided to the  ?                          patient. ?                          - Resume  previous diet. ?                          - Continue present medications. ?                          - Await pathology results. ?                          - Repeat colonoscopy in 3 years for surveillance. ?Procedure Code(s):        --- Professional --- ?                          [phone], Colonoscopy, flexible; with removal of  ?                          tumor(s), polyp(s), or other lesion(s) by snare  ?                          technique ?                          45380, 59, Colonoscopy, flexible; with biopsy,  ?                          single or multiple ?Diagnosis Code(s):        --- Professional --- ?                          N27.7, Other hemorrhoids ?                          K63.89, Other specified diseases of intestine ?                          K63.5, Polyp of colon ?                          D50.9, Iron deficiency anemia, unspecified ?                          K57.30, Diverticulosis of large intestine without  ?                          perforation or abscess without bleeding ?CPT copyright 2019 American Medical Association. All rights reserved. ?The codes documented in this report are preliminary and upon coder review may  ?be revised to meet current compliance  requirements. ?Elon Alas. Abbey Chatters, DO ?Elon Alas. Rhodhiss, DO ?09/02/2021 10:48:52 AM ?This report has been signed electronically. ?Number of Addenda: 0 ?

## 2021-09-02 NOTE — Anesthesia Preprocedure Evaluation (Addendum)
Anesthesia Evaluation  ?Patient identified by MRN, date of birth, ID band ?Patient awake ? ? ? ?Reviewed: ?Allergy & Precautions, H&P , NPO status , Patient's Chart, lab work & pertinent test results, reviewed documented beta blocker date and time  ? ?Airway ?Mallampati: II ? ?TM Distance: >3 FB ?Neck ROM: full ? ? ? Dental ? ?(+) Missing, Poor Dentition, Chipped, Dental Advisory Given ?  ?Pulmonary ?asthma , COPD, Current Smoker and Patient abstained from smoking.,  ?  ?Pulmonary exam normal ?breath sounds clear to auscultation ? ? ? ? ? ? Cardiovascular ?Exercise Tolerance: Good ?hypertension, + CAD, + Past MI and + Peripheral Vascular Disease  ? ?Rhythm:regular Rate:Normal ? ? ?  ?Neuro/Psych ?PSYCHIATRIC DISORDERS Anxiety Depression  Neuromuscular disease CVA   ? GI/Hepatic ?negative GI ROS, Neg liver ROS,   ?Endo/Other  ?negative endocrine ROS ? Renal/GU ?negative Renal ROS  ?negative genitourinary ?  ?Musculoskeletal ? ? Abdominal ?  ?Peds ? Hematology ? ?(+) Blood dyscrasia, anemia ,   ?Anesthesia Other Findings ?1. Left ventricular ejection fraction, by estimation, is 55 to 60%. The  ?left ventricle has normal function. The left ventricle has no regional  ?wall motion abnormalities. Left ventricular diastolic parameters are  ?consistent with Grade I diastolic  ?dysfunction (impaired relaxation).  ??2. Right ventricular systolic function is normal. The right ventricular  ?size is normal. Tricuspid regurgitation signal is inadequate for assessing  ?PA pressure.  ??3. Thickening of distal anterior MV leaflet likely represents  ?calcification, cannot exclude small vegetation. Consider TEE if clinical  ?suspicion of endocarditis. . The mitral valve is abnormal. Mild mitral  ?valve regurgitation. No evidence of mitral  ?stenosis.  ??4. The aortic valve is tricuspid. There is moderate calcification of the  ?aortic valve. There is moderate thickening of the aortic valve. Aortic   ?valve regurgitation is not visualized. No aortic stenosis is present.  ??5. The inferior vena cava is normal in size with greater than 50%  ?respiratory variability, suggesting right atrial pressure of 3 mmHg. ? Reproductive/Obstetrics ?negative OB ROS ? ?  ? ? ? ? ? ? ? ? ? ? ? ? ? ?  ?  ? ? ? ? ? ? ?Anesthesia Physical ?Anesthesia Plan ? ?ASA: 3 ? ?Anesthesia Plan: General  ? ?Post-op Pain Management:   ? ?Induction:  ? ?PONV Risk Score and Plan: Propofol infusion ? ?Airway Management Planned:  ? ?Additional Equipment:  ? ?Intra-op Plan:  ? ?Post-operative Plan:  ? ?Informed Consent: I have reviewed the patients History and Physical, chart, labs and discussed the procedure including the risks, benefits and alternatives for the proposed anesthesia with the patient or authorized representative who has indicated his/her understanding and acceptance.  ? ? ? ?Dental Advisory Given ? ?Plan Discussed with: CRNA ? ?Anesthesia Plan Comments:   ? ? ? ? ? ? ?Anesthesia Quick Evaluation ? ?

## 2021-09-02 NOTE — Transfer of Care (Signed)
Immediate Anesthesia Transfer of Care Note ? ?Patient: George Valdez ? ?Procedure(s) Performed: COLONOSCOPY WITH PROPOFOL ?ESOPHAGOGASTRODUODENOSCOPY (EGD) WITH PROPOFOL ?BIOPSY ?POLYPECTOMY ? ?Patient Location: Short Stay ? ?Anesthesia Type:General ? ?Level of Consciousness: awake ? ?Airway & Oxygen Therapy: Patient Spontanous Breathing ? ?Post-op Assessment: Report given to RN and Post -op Vital signs reviewed and stable ? ?Post vital signs: Reviewed and stable ? ?Last Vitals:  ?Vitals Value Taken Time  ?BP    ?Temp    ?Pulse    ?Resp    ?SpO2    ? ? ?Last Pain:  ?Vitals:  ? 09/02/21 1021  ?TempSrc:   ?PainSc: 0-No pain  ?   ? ?  ? ?Complications: No notable events documented. ?

## 2021-09-02 NOTE — Discharge Instructions (Addendum)
EGD ?Discharge instructions ?Please read the instructions outlined below and refer to this sheet in the next few weeks. These discharge instructions provide you with general information on caring for yourself after you leave the hospital. Your doctor may also give you specific instructions. While your treatment has been planned according to the most current medical practices available, unavoidable complications occasionally occur. If you have any problems or questions after discharge, please call your doctor. ?ACTIVITY ?You may resume your regular activity but move at a slower pace for the next 24 hours.  ?Take frequent rest periods for the next 24 hours.  ?Walking will help expel (get rid of) the air and reduce the bloated feeling in your abdomen.  ?No driving for 24 hours (because of the anesthesia (medicine) used during the test).  ?You may shower.  ?Do not sign any important legal documents or operate any machinery for 24 hours (because of the anesthesia used during the test).  ?NUTRITION ?Drink plenty of fluids.  ?You may resume your normal diet.  ?Begin with a light meal and progress to your normal diet.  ?Avoid alcoholic beverages for 24 hours or as instructed by your caregiver.  ?MEDICATIONS ?You may resume your normal medications unless your caregiver tells you otherwise.  ?WHAT YOU CAN EXPECT TODAY ?You may experience abdominal discomfort such as a feeling of fullness or ?gas? pains.  ?FOLLOW-UP ?Your doctor will discuss the results of your test with you.  ?SEEK IMMEDIATE MEDICAL ATTENTION IF ANY OF THE FOLLOWING OCCUR: ?Excessive nausea (feeling sick to your stomach) and/or vomiting.  ?Severe abdominal pain and distention (swelling).  ?Trouble swallowing.  ?Temperature over 101? F (37.8? C).  ?Rectal bleeding or vomiting of blood.  ? ? ?Colonoscopy ?Discharge Instructions ? ?Read the instructions outlined below and refer to this sheet in the next few weeks. These discharge instructions provide you with  general information on caring for yourself after you leave the hospital. Your doctor may also give you specific instructions. While your treatment has been planned according to the most current medical practices available, unavoidable complications occasionally occur.  ? ?ACTIVITY ?You may resume your regular activity, but move at a slower pace for the next 24 hours.  ?Take frequent rest periods for the next 24 hours.  ?Walking will help get rid of the air and reduce the bloated feeling in your belly (abdomen).  ?No driving for 24 hours (because of the medicine (anesthesia) used during the test).   ?Do not sign any important legal documents or operate any machinery for 24 hours (because of the anesthesia used during the test).  ?NUTRITION ?Drink plenty of fluids.  ?You may resume your normal diet as instructed by your doctor.  ?Begin with a light meal and progress to your normal diet. Heavy or fried foods are harder to digest and may make you feel sick to your stomach (nauseated).  ?Avoid alcoholic beverages for 24 hours or as instructed.  ?MEDICATIONS ?You may resume your normal medications unless your doctor tells you otherwise.  ?WHAT YOU CAN EXPECT TODAY ?Some feelings of bloating in the abdomen.  ?Passage of more gas than usual.  ?Spotting of blood in your stool or on the toilet paper.  ?IF YOU HAD POLYPS REMOVED DURING THE COLONOSCOPY: ?No aspirin products for 7 days or as instructed.  ?No alcohol for 7 days or as instructed.  ?Eat a soft diet for the next 24 hours.  ?FINDING OUT THE RESULTS OF YOUR TEST ?Not all test results are available  during your visit. If your test results are not back during the visit, make an appointment with your caregiver to find out the results. Do not assume everything is normal if you have not heard from your caregiver or the medical facility. It is important for you to follow up on all of your test results.  ?SEEK IMMEDIATE MEDICAL ATTENTION IF: ?You have more than a spotting of  blood in your stool.  ?Your belly is swollen (abdominal distention).  ?You are nauseated or vomiting.  ?You have a temperature over 101.  ?You have abdominal pain or discomfort that is severe or gets worse throughout the day.  ? ?Your EGD was relatively unremarkable.  I did not find any evidence of bleeding.  No ulcers.  Slight inflammation in the stomach.  Otherwise unremarkable. ? ?Your colonoscopy revealed 5 polyp(s) which I removed successfully. Await pathology results, my office will contact you. I recommend repeating colonoscopy in 3 years for surveillance purposes.  The right side your colon was nodular with cobblestoning.  Unclear significance.  I did take multiple biopsies of this area.  We will wait on pathology results to determine next steps. ? ? ?I hope you have a great rest of your week! ? ?Elon Alas. Abbey Chatters, D.O. ?Gastroenterology and Hepatology ?[facility] ? ?

## 2021-09-03 DIAGNOSIS — A419 Sepsis, unspecified organism: Secondary | ICD-10-CM | POA: Diagnosis not present

## 2021-09-03 DIAGNOSIS — R262 Difficulty in walking, not elsewhere classified: Secondary | ICD-10-CM | POA: Diagnosis not present

## 2021-09-03 DIAGNOSIS — M6281 Muscle weakness (generalized): Secondary | ICD-10-CM | POA: Diagnosis not present

## 2021-09-03 DIAGNOSIS — R279 Unspecified lack of coordination: Secondary | ICD-10-CM | POA: Diagnosis not present

## 2021-09-07 LAB — SURGICAL PATHOLOGY

## 2021-09-08 ENCOUNTER — Encounter (HOSPITAL_COMMUNITY): Payer: Self-pay | Admitting: Internal Medicine

## 2021-09-09 ENCOUNTER — Encounter: Payer: Self-pay | Admitting: Internal Medicine

## 2021-09-14 DIAGNOSIS — Z515 Encounter for palliative care: Secondary | ICD-10-CM | POA: Diagnosis not present

## 2021-09-14 DIAGNOSIS — R531 Weakness: Secondary | ICD-10-CM | POA: Diagnosis not present

## 2021-09-14 DIAGNOSIS — I639 Cerebral infarction, unspecified: Secondary | ICD-10-CM | POA: Diagnosis not present

## 2021-09-28 DIAGNOSIS — K5900 Constipation, unspecified: Secondary | ICD-10-CM | POA: Diagnosis not present

## 2021-09-28 DIAGNOSIS — J449 Chronic obstructive pulmonary disease, unspecified: Secondary | ICD-10-CM | POA: Diagnosis not present

## 2021-09-28 DIAGNOSIS — K219 Gastro-esophageal reflux disease without esophagitis: Secondary | ICD-10-CM | POA: Diagnosis not present

## 2021-10-07 DIAGNOSIS — J449 Chronic obstructive pulmonary disease, unspecified: Secondary | ICD-10-CM | POA: Diagnosis not present

## 2021-10-07 DIAGNOSIS — Z79899 Other long term (current) drug therapy: Secondary | ICD-10-CM | POA: Diagnosis not present

## 2021-10-14 DIAGNOSIS — R69 Illness, unspecified: Secondary | ICD-10-CM | POA: Diagnosis not present

## 2021-10-14 DIAGNOSIS — F411 Generalized anxiety disorder: Secondary | ICD-10-CM | POA: Diagnosis not present

## 2021-10-14 DIAGNOSIS — F330 Major depressive disorder, recurrent, mild: Secondary | ICD-10-CM | POA: Diagnosis not present

## 2021-10-14 DIAGNOSIS — G4709 Other insomnia: Secondary | ICD-10-CM | POA: Diagnosis not present

## 2021-10-19 DIAGNOSIS — Z79899 Other long term (current) drug therapy: Secondary | ICD-10-CM | POA: Diagnosis not present

## 2021-10-19 DIAGNOSIS — R195 Other fecal abnormalities: Secondary | ICD-10-CM | POA: Diagnosis not present

## 2021-10-21 DIAGNOSIS — H524 Presbyopia: Secondary | ICD-10-CM | POA: Diagnosis not present

## 2021-10-21 DIAGNOSIS — H25813 Combined forms of age-related cataract, bilateral: Secondary | ICD-10-CM | POA: Diagnosis not present

## 2021-10-26 DIAGNOSIS — Z515 Encounter for palliative care: Secondary | ICD-10-CM | POA: Diagnosis not present

## 2021-10-26 DIAGNOSIS — R531 Weakness: Secondary | ICD-10-CM | POA: Diagnosis not present

## 2021-10-26 DIAGNOSIS — I639 Cerebral infarction, unspecified: Secondary | ICD-10-CM | POA: Diagnosis not present

## 2021-11-05 DIAGNOSIS — M6281 Muscle weakness (generalized): Secondary | ICD-10-CM | POA: Diagnosis not present

## 2021-11-05 DIAGNOSIS — R262 Difficulty in walking, not elsewhere classified: Secondary | ICD-10-CM | POA: Diagnosis not present

## 2021-11-05 DIAGNOSIS — N390 Urinary tract infection, site not specified: Secondary | ICD-10-CM | POA: Diagnosis not present

## 2021-11-08 DIAGNOSIS — N390 Urinary tract infection, site not specified: Secondary | ICD-10-CM | POA: Diagnosis not present

## 2021-11-08 DIAGNOSIS — R262 Difficulty in walking, not elsewhere classified: Secondary | ICD-10-CM | POA: Diagnosis not present

## 2021-11-08 DIAGNOSIS — M6281 Muscle weakness (generalized): Secondary | ICD-10-CM | POA: Diagnosis not present

## 2021-11-09 DIAGNOSIS — M6281 Muscle weakness (generalized): Secondary | ICD-10-CM | POA: Diagnosis not present

## 2021-11-09 DIAGNOSIS — N390 Urinary tract infection, site not specified: Secondary | ICD-10-CM | POA: Diagnosis not present

## 2021-11-09 DIAGNOSIS — R262 Difficulty in walking, not elsewhere classified: Secondary | ICD-10-CM | POA: Diagnosis not present

## 2021-11-10 DIAGNOSIS — N390 Urinary tract infection, site not specified: Secondary | ICD-10-CM | POA: Diagnosis not present

## 2021-11-10 DIAGNOSIS — M6281 Muscle weakness (generalized): Secondary | ICD-10-CM | POA: Diagnosis not present

## 2021-11-10 DIAGNOSIS — R262 Difficulty in walking, not elsewhere classified: Secondary | ICD-10-CM | POA: Diagnosis not present

## 2021-11-11 DIAGNOSIS — R262 Difficulty in walking, not elsewhere classified: Secondary | ICD-10-CM | POA: Diagnosis not present

## 2021-11-11 DIAGNOSIS — Z0100 Encounter for examination of eyes and vision without abnormal findings: Secondary | ICD-10-CM | POA: Diagnosis not present

## 2021-11-11 DIAGNOSIS — N390 Urinary tract infection, site not specified: Secondary | ICD-10-CM | POA: Diagnosis not present

## 2021-11-11 DIAGNOSIS — M6281 Muscle weakness (generalized): Secondary | ICD-10-CM | POA: Diagnosis not present

## 2021-11-12 DIAGNOSIS — N390 Urinary tract infection, site not specified: Secondary | ICD-10-CM | POA: Diagnosis not present

## 2021-11-12 DIAGNOSIS — R262 Difficulty in walking, not elsewhere classified: Secondary | ICD-10-CM | POA: Diagnosis not present

## 2021-11-12 DIAGNOSIS — M6281 Muscle weakness (generalized): Secondary | ICD-10-CM | POA: Diagnosis not present

## 2021-11-15 DIAGNOSIS — M6281 Muscle weakness (generalized): Secondary | ICD-10-CM | POA: Diagnosis not present

## 2021-11-15 DIAGNOSIS — R262 Difficulty in walking, not elsewhere classified: Secondary | ICD-10-CM | POA: Diagnosis not present

## 2021-11-15 DIAGNOSIS — N390 Urinary tract infection, site not specified: Secondary | ICD-10-CM | POA: Diagnosis not present

## 2021-11-16 DIAGNOSIS — N390 Urinary tract infection, site not specified: Secondary | ICD-10-CM | POA: Diagnosis not present

## 2021-11-16 DIAGNOSIS — R262 Difficulty in walking, not elsewhere classified: Secondary | ICD-10-CM | POA: Diagnosis not present

## 2021-11-16 DIAGNOSIS — M6281 Muscle weakness (generalized): Secondary | ICD-10-CM | POA: Diagnosis not present

## 2021-11-17 DIAGNOSIS — N390 Urinary tract infection, site not specified: Secondary | ICD-10-CM | POA: Diagnosis not present

## 2021-11-17 DIAGNOSIS — M6281 Muscle weakness (generalized): Secondary | ICD-10-CM | POA: Diagnosis not present

## 2021-11-17 DIAGNOSIS — R262 Difficulty in walking, not elsewhere classified: Secondary | ICD-10-CM | POA: Diagnosis not present

## 2021-11-18 DIAGNOSIS — M6281 Muscle weakness (generalized): Secondary | ICD-10-CM | POA: Diagnosis not present

## 2021-11-18 DIAGNOSIS — R262 Difficulty in walking, not elsewhere classified: Secondary | ICD-10-CM | POA: Diagnosis not present

## 2021-11-18 DIAGNOSIS — N390 Urinary tract infection, site not specified: Secondary | ICD-10-CM | POA: Diagnosis not present

## 2021-11-19 DIAGNOSIS — N390 Urinary tract infection, site not specified: Secondary | ICD-10-CM | POA: Diagnosis not present

## 2021-11-19 DIAGNOSIS — M6281 Muscle weakness (generalized): Secondary | ICD-10-CM | POA: Diagnosis not present

## 2021-11-19 DIAGNOSIS — R262 Difficulty in walking, not elsewhere classified: Secondary | ICD-10-CM | POA: Diagnosis not present

## 2021-11-22 DIAGNOSIS — R262 Difficulty in walking, not elsewhere classified: Secondary | ICD-10-CM | POA: Diagnosis not present

## 2021-11-22 DIAGNOSIS — N390 Urinary tract infection, site not specified: Secondary | ICD-10-CM | POA: Diagnosis not present

## 2021-11-22 DIAGNOSIS — M6281 Muscle weakness (generalized): Secondary | ICD-10-CM | POA: Diagnosis not present

## 2021-11-23 DIAGNOSIS — M6281 Muscle weakness (generalized): Secondary | ICD-10-CM | POA: Diagnosis not present

## 2021-11-23 DIAGNOSIS — N390 Urinary tract infection, site not specified: Secondary | ICD-10-CM | POA: Diagnosis not present

## 2021-11-23 DIAGNOSIS — R262 Difficulty in walking, not elsewhere classified: Secondary | ICD-10-CM | POA: Diagnosis not present

## 2021-11-24 DIAGNOSIS — M6281 Muscle weakness (generalized): Secondary | ICD-10-CM | POA: Diagnosis not present

## 2021-11-24 DIAGNOSIS — R262 Difficulty in walking, not elsewhere classified: Secondary | ICD-10-CM | POA: Diagnosis not present

## 2021-11-24 DIAGNOSIS — N390 Urinary tract infection, site not specified: Secondary | ICD-10-CM | POA: Diagnosis not present

## 2021-11-25 DIAGNOSIS — R262 Difficulty in walking, not elsewhere classified: Secondary | ICD-10-CM | POA: Diagnosis not present

## 2021-11-25 DIAGNOSIS — M6281 Muscle weakness (generalized): Secondary | ICD-10-CM | POA: Diagnosis not present

## 2021-11-25 DIAGNOSIS — N390 Urinary tract infection, site not specified: Secondary | ICD-10-CM | POA: Diagnosis not present

## 2021-11-26 DIAGNOSIS — M6281 Muscle weakness (generalized): Secondary | ICD-10-CM | POA: Diagnosis not present

## 2021-11-26 DIAGNOSIS — N390 Urinary tract infection, site not specified: Secondary | ICD-10-CM | POA: Diagnosis not present

## 2021-11-26 DIAGNOSIS — R262 Difficulty in walking, not elsewhere classified: Secondary | ICD-10-CM | POA: Diagnosis not present

## 2021-11-29 DIAGNOSIS — N390 Urinary tract infection, site not specified: Secondary | ICD-10-CM | POA: Diagnosis not present

## 2021-11-29 DIAGNOSIS — R262 Difficulty in walking, not elsewhere classified: Secondary | ICD-10-CM | POA: Diagnosis not present

## 2021-11-29 DIAGNOSIS — M6281 Muscle weakness (generalized): Secondary | ICD-10-CM | POA: Diagnosis not present

## 2021-11-30 DIAGNOSIS — R69 Illness, unspecified: Secondary | ICD-10-CM | POA: Diagnosis not present

## 2021-11-30 DIAGNOSIS — N390 Urinary tract infection, site not specified: Secondary | ICD-10-CM | POA: Diagnosis not present

## 2021-11-30 DIAGNOSIS — M6281 Muscle weakness (generalized): Secondary | ICD-10-CM | POA: Diagnosis not present

## 2021-11-30 DIAGNOSIS — F330 Major depressive disorder, recurrent, mild: Secondary | ICD-10-CM | POA: Diagnosis not present

## 2021-11-30 DIAGNOSIS — G4709 Other insomnia: Secondary | ICD-10-CM | POA: Diagnosis not present

## 2021-11-30 DIAGNOSIS — F411 Generalized anxiety disorder: Secondary | ICD-10-CM | POA: Diagnosis not present

## 2021-11-30 DIAGNOSIS — R262 Difficulty in walking, not elsewhere classified: Secondary | ICD-10-CM | POA: Diagnosis not present

## 2021-12-01 DIAGNOSIS — M6281 Muscle weakness (generalized): Secondary | ICD-10-CM | POA: Diagnosis not present

## 2021-12-01 DIAGNOSIS — R262 Difficulty in walking, not elsewhere classified: Secondary | ICD-10-CM | POA: Diagnosis not present

## 2021-12-01 DIAGNOSIS — N390 Urinary tract infection, site not specified: Secondary | ICD-10-CM | POA: Diagnosis not present

## 2021-12-02 DIAGNOSIS — N390 Urinary tract infection, site not specified: Secondary | ICD-10-CM | POA: Diagnosis not present

## 2021-12-02 DIAGNOSIS — M6281 Muscle weakness (generalized): Secondary | ICD-10-CM | POA: Diagnosis not present

## 2021-12-02 DIAGNOSIS — R262 Difficulty in walking, not elsewhere classified: Secondary | ICD-10-CM | POA: Diagnosis not present

## 2021-12-03 DIAGNOSIS — I7091 Generalized atherosclerosis: Secondary | ICD-10-CM | POA: Diagnosis not present

## 2021-12-03 DIAGNOSIS — B351 Tinea unguium: Secondary | ICD-10-CM | POA: Diagnosis not present

## 2021-12-03 DIAGNOSIS — R262 Difficulty in walking, not elsewhere classified: Secondary | ICD-10-CM | POA: Diagnosis not present

## 2021-12-03 DIAGNOSIS — M6281 Muscle weakness (generalized): Secondary | ICD-10-CM | POA: Diagnosis not present

## 2021-12-03 DIAGNOSIS — E1159 Type 2 diabetes mellitus with other circulatory complications: Secondary | ICD-10-CM | POA: Diagnosis not present

## 2021-12-03 DIAGNOSIS — N390 Urinary tract infection, site not specified: Secondary | ICD-10-CM | POA: Diagnosis not present

## 2021-12-06 DIAGNOSIS — M6281 Muscle weakness (generalized): Secondary | ICD-10-CM | POA: Diagnosis not present

## 2021-12-06 DIAGNOSIS — R262 Difficulty in walking, not elsewhere classified: Secondary | ICD-10-CM | POA: Diagnosis not present

## 2021-12-06 DIAGNOSIS — N390 Urinary tract infection, site not specified: Secondary | ICD-10-CM | POA: Diagnosis not present

## 2021-12-07 DIAGNOSIS — R262 Difficulty in walking, not elsewhere classified: Secondary | ICD-10-CM | POA: Diagnosis not present

## 2021-12-07 DIAGNOSIS — M6281 Muscle weakness (generalized): Secondary | ICD-10-CM | POA: Diagnosis not present

## 2021-12-07 DIAGNOSIS — N390 Urinary tract infection, site not specified: Secondary | ICD-10-CM | POA: Diagnosis not present

## 2021-12-08 DIAGNOSIS — N390 Urinary tract infection, site not specified: Secondary | ICD-10-CM | POA: Diagnosis not present

## 2021-12-08 DIAGNOSIS — R262 Difficulty in walking, not elsewhere classified: Secondary | ICD-10-CM | POA: Diagnosis not present

## 2021-12-08 DIAGNOSIS — M6281 Muscle weakness (generalized): Secondary | ICD-10-CM | POA: Diagnosis not present

## 2021-12-09 DIAGNOSIS — M6281 Muscle weakness (generalized): Secondary | ICD-10-CM | POA: Diagnosis not present

## 2021-12-09 DIAGNOSIS — N390 Urinary tract infection, site not specified: Secondary | ICD-10-CM | POA: Diagnosis not present

## 2021-12-09 DIAGNOSIS — R262 Difficulty in walking, not elsewhere classified: Secondary | ICD-10-CM | POA: Diagnosis not present

## 2021-12-21 DIAGNOSIS — I679 Cerebrovascular disease, unspecified: Secondary | ICD-10-CM | POA: Diagnosis not present

## 2021-12-21 DIAGNOSIS — R69 Illness, unspecified: Secondary | ICD-10-CM | POA: Diagnosis not present

## 2021-12-21 DIAGNOSIS — E782 Mixed hyperlipidemia: Secondary | ICD-10-CM | POA: Diagnosis not present

## 2022-05-24 ENCOUNTER — Telehealth: Payer: Self-pay

## 2022-05-24 NOTE — Telephone Encounter (Signed)
Amy with [facility] called stating that this pt needed an appt d/t cold feet and non-palpable pedal pulses.   Reviewed pt's chart, returned call for clarification, transferred to extension, no answer, lf vm.  Called back, Joy at reception gave assurance that Amy would call back.

## 2022-05-31 ENCOUNTER — Other Ambulatory Visit: Payer: Self-pay | Admitting: *Deleted

## 2022-05-31 DIAGNOSIS — I739 Peripheral vascular disease, unspecified: Secondary | ICD-10-CM

## 2022-06-06 NOTE — Progress Notes (Unsigned)
[facility] [place]  ASSESSMENT / PLAN: George Valdez is a 66 y.o. male with atherosclerosis of native arteries of bilateral lower extremities causing minimal symptoms. No evidence of chronic limb threatening ischemia.   Recommend the following which can slow the progression of atherosclerosis and reduce the risk of major adverse cardiac / limb events:  Complete cessation from all tobacco products. Blood glucose control with goal A1c < 7%. Blood pressure control with goal blood pressure < 140/90 mmHg. Lipid reduction therapy with goal LDL-C <100 mg/dL (<70 if symptomatic from PAD).  Aspirin '81mg'$  PO QD.  Clopidogrel '75mg'$  PO QD. Atorvastatin 40-'80mg'$  PO QD (or other "high intensity" statin therapy).  Patient counseled about limb threatening symptoms. Recommend follow up should these develop. Otherwise will see on as-needed basis.   CHIEF COMPLAINT: cold feet  HISTORY OF PRESENT ILLNESS: George Valdez is a 66 y.o. male who has been residing in a nursing home since a fairly disabling stroke.  He is minimally ambulatory.  He is able to transfer between chair and bed and wheelchair.  He does not walk much.  He does not walk fast or far enough to claudicate.  He denies any symptoms of ischemic rest pain.  He has no ulcers about his feet.   Past Medical History:  Diagnosis Date   Alcohol abuse    Anxiety    Asthma    BPH (benign prostatic hyperplasia)    Chronic pain    COPD (chronic obstructive pulmonary disease) (HCC)    Coronary artery disease    Depression    Frequent falls    Hyperlipemia    Hypertension    Myocardial infarction ([place])    Neuropathy    Pernicious anemia    Stroke [facility])    left sided weakness    Past Surgical History:  Procedure Laterality Date   BIOPSY  09/02/2021   Procedure: BIOPSY;  Surgeon: Eloise Harman, DO;  [facility];  Service: Endoscopy;;   CARDIAC CATHETERIZATION     CAROTID ENDARTERECTOMY      COLONOSCOPY WITH PROPOFOL N/A 09/02/2021   Procedure: COLONOSCOPY WITH PROPOFOL;  Surgeon: Eloise Harman, DO;  [facility];  Service: Endoscopy;  Laterality: N/A;  11:45am   ESOPHAGOGASTRODUODENOSCOPY (EGD) WITH PROPOFOL N/A 09/02/2021   Procedure: ESOPHAGOGASTRODUODENOSCOPY (EGD) WITH PROPOFOL;  Surgeon: Eloise Harman, DO;  [facility];  Service: Endoscopy;  Laterality: N/A;   POLYPECTOMY  09/02/2021   Procedure: POLYPECTOMY;  Surgeon: Eloise Harman, DO;  [facility];  Service: Endoscopy;;    Family History  Problem Relation Age of Onset   Congestive Heart Failure Mother 53   Heart attack Father 43   Congestive Heart Failure Sister    Colon cancer Neg Hx    Colon polyps Neg Hx     Social History   Socioeconomic History   Marital status: Married    Spouse name: Not on file   Number of children: Not on file   Years of education: Not on file   Highest education level: Not on file  Occupational History   Not on file  Tobacco Use   Smoking status: Every Day    Packs/day: 0.25    Years: 40.00    Total pack years: 10.00    Types: Cigarettes   Smokeless tobacco: Never   Tobacco comments:    down to 5 cigarettes/day from 1.5 PPD  Vaping Use   Vaping Use: Never  used  Substance and Sexual Activity   Alcohol use: Not Currently    Alcohol/week: 12.0 standard drinks of alcohol    Types: 12 Standard drinks or equivalent per week    Comment: denies   Drug use: No   Sexual activity: Not on file  Other Topics Concern   Not on file  Social History Narrative   Not on file   Social Determinants of Health   Financial Resource Strain: Not on file  Food Insecurity: Not on file  Transportation Needs: Not on file  Physical Activity: Not on file  Stress: Not on file  Social Connections: Not on file  Intimate Partner Violence: Not on file    Allergies  Allergen Reactions   Codeine Itching    Current Outpatient Medications   Medication Sig Dispense Refill   albuterol (PROVENTIL HFA;VENTOLIN HFA) 108 (90 BASE) MCG/ACT inhaler Inhale 2 puffs into the lungs every 6 (six) hours as needed for wheezing or shortness of breath.     ALPRAZolam (XANAX) 1 MG tablet Take 0.5 tablets (0.5 mg total) by mouth every 8 (eight) hours as needed for anxiety. 10 tablet 0   aspirin EC 81 MG tablet Take 81 mg by mouth daily. Reported on 11/16/2015     clopidogrel (PLAVIX) 75 MG tablet Take 1 tablet (75 mg total) by mouth daily.     cyanocobalamin (,VITAMIN B-12,) 1000 MCG/ML injection Inject 1,000 mcg into the muscle every 30 (thirty) days.     DULoxetine (CYMBALTA) 60 MG capsule Take 60 mg by mouth daily.     feeding supplement (ENSURE ENLIVE / ENSURE PLUS) LIQD Take 237 mLs by mouth 3 (three) times daily between meals.     metoprolol succinate (TOPROL-XL) 25 MG 24 hr tablet Take 0.5 tablets (12.5 mg total) by mouth 2 (two) times daily. Take with or immediately following a meal.     Multiple Vitamin (MULTIVITAMIN WITH MINERALS) TABS tablet Take 1 tablet by mouth daily.     pantoprazole (PROTONIX) 40 MG tablet Take 1 tablet (40 mg total) by mouth daily. 30 tablet 3   polyethylene glycol (MIRALAX / GLYCOLAX) 17 g packet Take 17 g by mouth 2 (two) times daily.     potassium chloride 20 MEQ TBCR Take 20 mEq by mouth daily. 30 tablet 0   rosuvastatin (CRESTOR) 40 MG tablet Take 40 mg by mouth daily.     SPIRIVA RESPIMAT 2.5 MCG/ACT AERS Inhale 1 puff into the lungs daily.     thiamine 100 MG tablet Take 1 tablet (100 mg total) by mouth daily. 30 tablet 0   umeclidinium bromide (INCRUSE ELLIPTA) 62.5 MCG/ACT AEPB Inhale 1 puff into the lungs daily.     vitamin E 400 UNIT capsule Take 400 Units by mouth daily.     ezetimibe (ZETIA) 10 MG tablet Take 1 tablet (10 mg total) by mouth daily. 90 tablet 3   No current facility-administered medications for this visit.    PHYSICAL EXAM Vitals:   06/07/22 1243  BP: (!) 151/77  Pulse: 71  Resp:  20  Temp: 98.3 F (36.8 C)  SpO2: 99%  Weight: 138 lb (62.6 kg)  Height: '5\' 3"'$  (26.39 m)    66 year old man appearing older than stated age.  Regular rate and rhythm Unlabored breathing No palpable pedal pulses  PERTINENT LABORATORY AND RADIOLOGIC DATA  Most recent CBC    Latest Ref Rng & Units 08/31/2021   11:11 AM 07/16/2021    5:22 AM 07/14/2021  6:55 AM  CBC  WBC 4.0 - 10.5 K/uL 7.4  10.2  10.5   Hemoglobin 13.0 - 17.0 g/dL 10.8  9.1  9.5   Hematocrit 39.0 - 52.0 % 35.6  30.1  30.0   Platelets 150 - 400 K/uL 405  273  302      Most recent CMP    Latest Ref Rng & Units 08/31/2021   11:11 AM 07/16/2021    5:22 AM 07/14/2021    6:55 AM  CMP  Glucose 70 - 99 mg/dL 91  102  89   BUN 8 - 23 mg/dL '17  15  17   '$ Creatinine 0.61 - 1.24 mg/dL 1.14  1.28  1.60   Sodium 135 - 145 mmol/L 139  139  143   Potassium 3.5 - 5.1 mmol/L 4.5  3.7  3.4   Chloride 98 - 111 mmol/L 109  109  117   CO2 22 - 32 mmol/L '24  20  17   '$ Calcium 8.9 - 10.3 mg/dL 8.8  8.1  8.0   Total Protein 6.5 - 8.1 g/dL 6.6  5.4  5.6   Total Bilirubin 0.3 - 1.2 mg/dL 0.3  0.7  0.5   Alkaline Phos 38 - 126 U/L 60  60  72   AST 15 - 41 U/L 36  105  251   ALT 0 - 44 U/L 40  152  221     Renal function CrCl cannot be calculated (Patient's most recent lab result is older than the maximum 21 days allowed.).  Hgb A1c MFr Bld (%)  Date Value  11/30/2015 5.9 (H)     +-------+-----------+-----------+------------+------------+  ABI/TBIToday's ABIToday's TBIPrevious ABIPrevious TBI  +-------+-----------+-----------+------------+------------+  Right 0.69       0.51                                 +-------+-----------+-----------+------------+------------+  Left  0.66       0.47                                 +-------+-----------+-----------+------------+------------+   George Valdez. Stanford Breed, MD FACS [facility] Phone Number: [phone] 06/07/2022 1:28  PM   Total time spent on preparing this encounter including chart review, data review, collecting history, examining the patient, coordinating care for this new patient, 45 minutes.  Portions of this report may have been transcribed using voice recognition software.  Every effort has been made to ensure accuracy; however, inadvertent computerized transcription errors may still be present.

## 2022-06-06 NOTE — H&P (Signed)
  Patient: Decarlo Rivet  PID: [id number]  DOB: 04-Jan-1956  SEX: Male   Patient referred by Marchelle Folks, DDS for extraction all remaining teeth  CC: bad teeth.   Past Medical History:  TIA, Rhabdomyolysis, Hyperlipidemia, PVD, CKD, CAD, Diabetes    Medications: Tylenol, ASA, Benadryl, Duloxatene, Ezetimibe, Incruse Ellipta, Metoprolol, Plavix, Potassium, Protonix, Rosuvastatin, trazadone    Allergies:     Codeine    Surgeries:   Colonoscopy, EGD, Carotid Endarterectomy     Social History       Smoking: 1/2 ppd           Alcohol:n Drug use:n                             Exam: BMI 26. Gross decay all remaining teeth # 6-15, 20-29. Bilateral mandibular lingual tori.  No purulence, edema, fluctuance, trismus. Oral cancer screening negative. Pharynx clear. No lymphadenopathy.  Panorex:Gross decay all remaining teeth # 6-15, 20-29.  Assessment: ASA 3. Non-restorable   teeth # 6-15, 20-29.            Plan: Extraction Teeth # 6-15, 20-29, Alveoloplasty, removal lingual tori.  Hospital Day surgery.                 Rx: n               Risks and complications explained. Questions answered.   Gae Bon, DMD

## 2022-06-07 ENCOUNTER — Ambulatory Visit (INDEPENDENT_AMBULATORY_CARE_PROVIDER_SITE_OTHER): Payer: Medicare Other | Admitting: Vascular Surgery

## 2022-06-07 ENCOUNTER — Encounter: Payer: Self-pay | Admitting: Vascular Surgery

## 2022-06-07 ENCOUNTER — Ambulatory Visit (HOSPITAL_COMMUNITY)
Admission: RE | Admit: 2022-06-07 | Discharge: 2022-06-07 | Disposition: A | Discharge status: Home / Self Care | Payer: Medicare Other | Source: Ambulatory Visit | Attending: Vascular Surgery | Admitting: Vascular Surgery

## 2022-06-07 VITALS — BP 151/77 | HR 71 | Temp 98.3°F | Resp 20 | Ht 63.0 in | Wt 138.0 lb

## 2022-06-07 DIAGNOSIS — I739 Peripheral vascular disease, unspecified: Secondary | ICD-10-CM

## 2022-06-09 ENCOUNTER — Encounter (HOSPITAL_COMMUNITY): Payer: Self-pay | Admitting: Vascular Surgery

## 2022-06-09 NOTE — Pre-Procedure Instructions (Signed)
    George Valdez  06/09/2022      Your procedure is scheduled on Friday, December 15.  Report to [facility]mitting at [phone] A.M.  Call this number if you have problems the morning of surgery:[phone]- this is the pre- surgery desk  >>>>>Please send patient's Medication Record with medications administrated documentation. ( this information is required prior to OR. This includes medications that may have been on hold for surgery)<<<<<   If patient experience any cold or flu symptoms such as cough, fever, chills, shortness of breath, etc. between now and your scheduled surgery, please notify us at the above number.     Do not eat or drink after midnight.  Take these medicines the morning of surgery with A SIP OF WATER : Duloxetine, Zetimibe, Metoprolol, Pantoprazole. Use Inhalers. Hold Vitamins and herbal products. Follow instructions from Dr. Hoyt Koch on Plavix and ASA.   >>>George Valdez should have a shower, with antibacteria soap, the morning of surgery . Dry off with a clean towel. Patient should not have lotions, powders, colognes, deodorant, jewelry, or piercing's. Wear clean comfortable clothes.  Brush teeth. Do not wear lotions, powders, or perfumes, or deodorant. Men may shave face and neck. Do not bring valuables to the hospital.<<< [name] is not responsible for any belongings or valuables.

## 2022-06-09 NOTE — Progress Notes (Signed)
I spoke with George Valdez, George Valdez's nurse. George Valdez reports that George Valdez has not complained of chest pain or shortness of breath.  [employer] [facility] is [employer]. George Valdez is unable to walk alone. Patient can pivot to the wheelchair.  George Valdez has had falls, George Valdez reports that usually falls are from patient reaching to help another patient. George Valdez has diabetes and is diet controlled.  George Valdez is a smoker, I asked George Valdez to instruct patient to not smoke any more today or in am.  George Valdez states that there  were no orders to hold Plavix or ASA.

## 2022-06-09 NOTE — Progress Notes (Signed)
Anesthesia Chart Review: George Valdez  Case: [id number] Date/Time: 06/10/22 0835   Procedure: DENTAL RESTORATION/EXTRACTIONS   Anesthesia type: General   Pre-op diagnosis: DENTAL CARIES   Location: [facility] OR ROOM 04 / [place] OR   Surgeons: Diona Browner, DMD       DISCUSSION: Patient is a 66 year old male scheduled for the above procedure.  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   [facility] admission 07/10/21-07/16/21 for possible Streptococcus Group G bacteremia (only one or two cultures +), rhabdomyolysis, AKI (Cr 3.08), E. Coli UTI, failure to thrive, transaminitis (likely from dehydration/rhabdo), anemia (HGB 6.7, s/p 2 units PRBC, had out-patient EGD/colonoscopy 09/02/21 ). Presented with 3 day history of increased weakness and falls. WBC 22.8. 07/12/21 echo showed thickening of distal anterior MV leaflet likely represents calcification, cannot exclude small vegetation. Consider TEE if clinical suspicion of endocarditis. ID consulted and recommended repeat blood culture and if positive or evidence of embolic phenomenon then would consider TEE. Repeat cultures were negative x2 and felt okay to transition from Rocephin to Keflex and continued for 7 days post-discharged. Marland Kitchen Discharged to [facility] for further care and rehabilitation. LFTs normalized and Creatinine 1.14 by 08/31/21 repeat labs.   He saw vascular surgeon Dr. Stanford Breed on 06/07/22 for "cold feet". No significant symptoms. No palpable pedal pulses. Minimally ambulatory (transfers from bed to wheelchair). No peripheral ulcers. ABIs suggested moderate PAD, abnormal toe-branchial indexes. Advised smoking cessation, glucose/BP control, statin, continue ASA and Plavix. As needed follow-up.   Last cardiology  evaluation seen is from 07/22/19 by Kate Sable, MD, formerly with [facility]. This was his second visit with Dr. Bronson Ing. Notes suggest patient not a great historian, but had reported history of CVA x3, CAD and MI, carotid and LE "stents" previously followed by "Dr. Laverle Patter in [place], [place].". Carotid Dopplers in 2019 showed 1-39% RICA and 12-45% LICA with evidence of prior carotid endarterectomies and not carotid stents. He noted that Mr. George Valdez had not required previous coronary intervention--wife reported "45% blockage in the left circumflex coronary artery." He noted that Plavix had been prescribed for CVA history and not CAD. At last visit CAD felt clinically stable. Actual cath report or cardiology records from [place], [place] are not currently available for review.   PAT RN spoke with staff at SNF. Patient had not been given instructions to hold Plavix. Dr. Hoyt Koch is going to reschedule the case. I also reviewed history with anesthesiologist Oren Bracket, MD. He will need preoperative cardiology input. I have notified Sonia Baller at Dr. Lupita Leash office.   VS:  BP Readings from Last 3 Encounters:  06/07/22 (!) 151/77  09/02/21 (!) 96/56  08/31/21 114/68   Pulse Readings from Last 3 Encounters:  06/07/22 71  09/02/21 73  08/31/21 (!) 58     PROVIDERS: Patient resides at [facility]. Dr. Lupita Leash staff reported PCP is listed as Dr. Caprice Renshaw.  Jamelle Haring, MD is vascular surgeon Donato Heinz, MD is nephrologist Hurshel Keys, DO is GI   LABS: Most recent lab results in [facility] are from 08/31/21.Results include: Lab Results  Component Value Date   WBC 7.4 08/31/2021   HGB 10.8 (L) 08/31/2021   HCT 35.6 (L) 08/31/2021   PLT 405 (H) 08/31/2021   GLUCOSE 91 08/31/2021   ALT 40 08/31/2021  AST 36 08/31/2021   NA 139 08/31/2021   K 4.5 08/31/2021   CL 109 08/31/2021   CREATININE 1.14 08/31/2021   BUN 17 08/31/2021   CO2 24  08/31/2021   TSH 0.369 08/22/2016   INR 1.2 07/13/2021   HGBA1C 5.9 (H) 11/30/2015    OTHER: EGD 09/02/21:  IMPRESSION: - Z-line regular, 39 cm from the incisors. - Erythematous mucosa in the antrum. - Normal duodenal bulb, first portion of the duodenum and second portion of the duodenum. - No specimens collected.  Colonoscopy 09/02/21: IMPRESSION: - Preparation of the colon was fair. - Internal hemorrhoids. - Diverticulosis in the sigmoid colon. - Nodular mucosa in the transverse colon and in the ascending colon. Biopsied. - Four 3 to 6 mm polyps in the sigmoid colon and in the descending colon, removed with a cold snare. Resected and retrieved. - One 2 mm polyp in the descending colon, removed with a cold biopsy forceps. Resected and retrieved. - The examination was otherwise normal. Pathology report reviewed by Dr. Abbey Chatters, "Please let patient know polyps removed were tubular adenoma.  Recommend repeat colonoscopy in 3 years.   Biopsies of the colon showed acute/subacute nonspecific inflammation of his colon.  This could possibly be from infection, drug effect such as NSAIDs.  Given his location, ischemia or lack of blood flow to the colon also on differential.     He did not mention any abdominal pain or diarrhea to me during our visit.  Bowels moving well on MiraLAX.  I think we can continue to monitor for now."     IMAGES: 1V PCXR 07/10/21: FINDINGS: - The heart and mediastinal contours are unchanged. Aortic calcification - No focal consolidation. No pulmonary edema. No pleural effusion. No pneumothorax. - No acute osseous abnormality. IMPRESSION: No active disease.   CT Abd/pelvis 07/10/21: IMPRESSION: 1. Subcutaneous fat stranding right gluteal region which may reflect direct trauma and ecchymosis. 2. Age indeterminate fracture of the right fourth costochondral junction, only partially visualized on this study. Chronic right posterolateral eleventh rib fracture. 3.  Otherwise no acute intra-abdominal crash that marked retained stool within the distal colon, compatible with fecal impaction. No bowel obstruction or ileus. 4.  Aortic Atherosclerosis (ICD10-I70.0).  CT Head 07/10/21: IMPRESSION: Chronic  ischemic changes without acute abnormality.   EKG: 07/10/21: Sinus rhythm Nonspecific IVCD with LAD LVH with secondary repolarization abnormality Since last tracing QRS is widened Confirmed by Noemi Chapel [phone]) on 07/10/2021 10:08:54 PM - Known LAFB   CV: ABI 06/07/22: Summary:  - Right: Resting right ankle-brachial index indicates moderate right lower extremity arterial disease. The right toe-brachial index is abnormal.  - Left: Resting left ankle-brachial index indicates moderate left lower extremity arterial disease. The left toe-brachial index is abnormal.    Echo 07/12/21 (see DISCUSSION): IMPRESSIONS   1. Left ventricular ejection fraction, by estimation, is 55 to 60%. The left ventricle has normal function. The left ventricle has no regional wall motion abnormalities. Left ventricular diastolic parameters are consistent with Grade I diastolic  dysfunction (impaired relaxation).   2. Right ventricular systolic function is normal. The right ventricular size is normal. Tricuspid regurgitation signal is inadequate for assessing PA pressure.   3. Thickening of distal anterior MV leaflet likely represents calcification, cannot exclude small vegetation. Consider TEE if clinical suspicion of endocarditis. . The mitral valve is abnormal. Mild mitral valve regurgitation. No evidence of mitral  stenosis.   4. The aortic valve is tricuspid. There is moderate calcification of the aortic valve.  There is moderate thickening of the aortic valve. Aortic valve regurgitation is not visualized. No aortic stenosis is present.   5. The inferior vena cava is normal in size with greater than 50% respiratory variability, suggesting right atrial pressure of 3 mmHg.     US Carotid 02/07/18: Final Interpretation:  - Right Carotid: Velocities in the right ICA are consistent with a 1-39% stenosis. The ECA appears >50% stenosed.  - Left Carotid: Velocities in the left ICA are consistent with a 40-59% stenosis. Non-hemodynamically significant plaque noted in the CCA.  - Vertebrals:  Right vertebral artery demonstrates antegrade flow. Left vertebral artery demonstrates high resistant flow.  - Subclavians: Normal flow hemodynamics were seen in bilateral subclavian arteries.    Past Medical History:  Diagnosis Date   Alcohol abuse    Anxiety    Asthma    BPH (benign prostatic hyperplasia)    Chronic pain    COPD (chronic obstructive pulmonary disease) (HCC)    Coronary artery disease    Depression    Frequent falls    Hyperlipemia    Hypertension    Myocardial infarction ([place])    Neuropathy    Pernicious anemia    Stroke [facility])    left sided weakness    Past Surgical History:  Procedure Laterality Date   BIOPSY  09/02/2021   Procedure: BIOPSY;  Surgeon: Eloise Harman, DO;  [facility];  Service: Endoscopy;;   CARDIAC CATHETERIZATION     CAROTID ENDARTERECTOMY     COLONOSCOPY WITH PROPOFOL N/A 09/02/2021   Procedure: COLONOSCOPY WITH PROPOFOL;  Surgeon: Eloise Harman, DO;  [facility];  Service: Endoscopy;  Laterality: N/A;  11:45am   ESOPHAGOGASTRODUODENOSCOPY (EGD) WITH PROPOFOL N/A 09/02/2021   Procedure: ESOPHAGOGASTRODUODENOSCOPY (EGD) WITH PROPOFOL;  Surgeon: Eloise Harman, DO;  [facility];  Service: Endoscopy;  Laterality: N/A;   POLYPECTOMY  09/02/2021   Procedure: POLYPECTOMY;  Surgeon: Eloise Harman, DO;  [facility];  Service: Endoscopy;;    MEDICATIONS: No current facility-administered medications for this encounter.    albuterol (PROVENTIL HFA;VENTOLIN HFA) 108 (90 BASE) MCG/ACT inhaler   ALPRAZolam (XANAX) 1 MG tablet   aspirin EC 81 MG tablet   clopidogrel (PLAVIX) 75  MG tablet   cyanocobalamin (,VITAMIN B-12,) 1000 MCG/ML injection   DULoxetine (CYMBALTA) 60 MG capsule   ezetimibe (ZETIA) 10 MG tablet   feeding supplement (ENSURE ENLIVE / ENSURE PLUS) LIQD   metoprolol succinate (TOPROL-XL) 25 MG 24 hr tablet   Multiple Vitamin (MULTIVITAMIN WITH MINERALS) TABS tablet   pantoprazole (PROTONIX) 40 MG tablet   polyethylene glycol (MIRALAX / GLYCOLAX) 17 g packet   potassium chloride 20 MEQ TBCR   rosuvastatin (CRESTOR) 40 MG tablet   SPIRIVA RESPIMAT 2.5 MCG/ACT AERS   thiamine 100 MG tablet   umeclidinium bromide (INCRUSE ELLIPTA) 62.5 MCG/ACT AEPB   vitamin E 400 UNIT capsule    Myra Gianotti, PA-C Surgical Short Stay/Anesthesiology [facility] Phone [phone] [facility] Phone [phone] 06/09/2022 2:51 PM

## 2022-06-10 ENCOUNTER — Ambulatory Visit (HOSPITAL_COMMUNITY): Admission: RE | Admit: 2022-06-10 | Payer: Medicare Other | Source: Home / Self Care | Admitting: Oral Surgery

## 2022-06-10 SURGERY — DENTAL RESTORATION/EXTRACTIONS
Anesthesia: General

## 2022-07-04 ENCOUNTER — Telehealth: Payer: Self-pay | Admitting: Internal Medicine

## 2022-07-04 NOTE — Telephone Encounter (Signed)
   Pre-operative Risk Assessment    Patient Name: George Valdez  DOB: 12-08-1955 MRN: [id number]     Request for Surgical Clearance    Procedure:  Dental Extraction - Amount of Teeth to be Pulled:  Removal all remaining teeth (x20), Alveoloplasty, Removal mandibular lingual tori  Date of Surgery:  Clearance TBD                                 Surgeon:  Gae Bon, D.M.D., P.A. Surgeon's Group or Practice Name:  [facility] [place] Phone number:  [phone] Fax number:  [phone]   Type of Clearance Requested:   - Medical  - Pharmacy:  Hold Clopidogrel (Plavix) Please provide: Recent labs, Medication List, Need for SBE prophylaxis, Clearance for procedure with proposed anesthesia, Cardiac risk assessment, and Anticoagulation management - Can Pt D/C Plavix?   Type of Anesthesia:  General    Additional requests/questions:    Signed, Hipolito Bayley   07/04/2022, 8:36 AM

## 2022-07-04 NOTE — Telephone Encounter (Signed)
   Name: George Valdez  DOB: May 08, 1956  MRN: [id number]  Primary Cardiologist: Kate Sable, MD (Inactive)  Chart reviewed as part of pre-operative protocol coverage. Because of George Valdez's past medical history and time since last visit, he will require a follow-up in-office visit in order to better assess preoperative cardiovascular risk.  Pre-op covering staff: - Please schedule appointment and call patient to inform them. If patient already had an upcoming appointment within acceptable timeframe, please add "pre-op clearance" to the appointment notes so provider is aware. - Please contact requesting surgeon's office via preferred method (i.e, phone, fax) to inform them of need for appointment prior to surgery.  Plavix hold can be discussed at upcoming appointment.  Patient has not been seen since 2019.  Elgie Collard, PA-C  07/04/2022, 9:14 AM

## 2022-07-05 ENCOUNTER — Encounter: Payer: Self-pay | Admitting: Internal Medicine

## 2022-07-05 ENCOUNTER — Ambulatory Visit: Payer: Medicare Other | Attending: Internal Medicine | Admitting: Internal Medicine

## 2022-07-05 VITALS — BP 144/88 | HR 63 | Ht 63.0 in | Wt 142.2 lb

## 2022-07-05 DIAGNOSIS — Z01810 Encounter for preprocedural cardiovascular examination: Secondary | ICD-10-CM | POA: Diagnosis present

## 2022-07-05 DIAGNOSIS — I2510 Atherosclerotic heart disease of native coronary artery without angina pectoris: Secondary | ICD-10-CM | POA: Diagnosis present

## 2022-07-05 DIAGNOSIS — I159 Secondary hypertension, unspecified: Secondary | ICD-10-CM | POA: Diagnosis present

## 2022-07-05 DIAGNOSIS — I2583 Coronary atherosclerosis due to lipid rich plaque: Secondary | ICD-10-CM | POA: Diagnosis present

## 2022-07-05 NOTE — Telephone Encounter (Signed)
Pt was seen in the office today. I will send notes to pre op app to d/w MD if pt has been cleared.

## 2022-07-05 NOTE — Progress Notes (Signed)
Cardiology Office Note  Date: 07/05/2022   ID: George Valdez, DOB [birth year], MRN [id number]  PCP:  Patient, No Pcp Per  Cardiologist:  Kate Sable, MD (Inactive) Electrophysiologist:  None   Reason for Office Visit: Follow-up of CAD   History of Present Illness: George Valdez is a 67 y.o. male known to have carotid artery stents, history of three strokes, lower extremity stents follows up with vascular surgery, CAD (45% stenosis in LCx per prior documentation) presented to cardiology clinic for follow-up visit.  He is also here for preop cardiac risk certification for teeth extraction under general anesthesia.  Patient is wheelchair-bound due to prior history of strokes.  However, he does get up and walk multiple times throughout the day with a walker and has no symptoms of chest pain/SOB/dizziness/syncope. Denied any leg swelling, palpitations.  Current smoker, denied alcohol use and illicit drug abuse.  Past Medical History:  Diagnosis Date   Alcohol abuse    Anxiety    Asthma    BPH (benign prostatic hyperplasia)    Chronic pain    COPD (chronic obstructive pulmonary disease) (HCC)    Coronary artery disease    Depression    Frequent falls    Hyperlipemia    Hypertension    Myocardial infarction ([place])    Neuropathy    Pernicious anemia    Stroke [facility])    left sided weakness    Past Surgical History:  Procedure Laterality Date   BIOPSY  09/02/2021   Procedure: BIOPSY;  Surgeon: Eloise Harman, DO;  [facility];  Service: Endoscopy;;   CARDIAC CATHETERIZATION     CAROTID ENDARTERECTOMY     COLONOSCOPY WITH PROPOFOL N/A 09/02/2021   Procedure: COLONOSCOPY WITH PROPOFOL;  Surgeon: Eloise Harman, DO;  Location: [facility];  Service: Endoscopy;  Laterality: N/A;  11:45am   ESOPHAGOGASTRODUODENOSCOPY (EGD) WITH PROPOFOL N/A 09/02/2021   Procedure: ESOPHAGOGASTRODUODENOSCOPY (EGD) WITH PROPOFOL;  Surgeon: Eloise Harman, DO;   [facility]: [facility];  Service: Endoscopy;  Laterality: N/A;   POLYPECTOMY  09/02/2021   Procedure: POLYPECTOMY;  Surgeon: Eloise Harman, DO;  [facility]: [facility];  Service: Endoscopy;;    Current Outpatient Medications  Medication Sig Dispense Refill   acetaminophen (TYLENOL) 325 MG tablet Take 650 mg by mouth every 4 (four) hours as needed (pain.).     aspirin EC 81 MG tablet Take 81 mg by mouth daily.     clopidogrel (PLAVIX) 75 MG tablet Take 1 tablet (75 mg total) by mouth daily.     cyanocobalamin (,VITAMIN B-12,) 1000 MCG/ML injection Inject 1,000 mcg into the muscle every 30 (thirty) days.     DULoxetine (CYMBALTA) 60 MG capsule Take 60 mg by mouth daily.     ezetimibe (ZETIA) 10 MG tablet Take 1 tablet (10 mg total) by mouth daily. (Patient taking differently: Take 10 mg by mouth at bedtime.) 90 tablet 3   melatonin 5 MG TABS Take 5 mg by mouth at bedtime.     metoprolol succinate (TOPROL-XL) 25 MG 24 hr tablet Take 0.5 tablets (12.5 mg total) by mouth 2 (two) times daily. Take with or immediately following a meal.     Multiple Vitamin (MULTIVITAMIN WITH MINERALS) TABS tablet Take 1 tablet by mouth daily.     Nutritional Supplements (NUTRITIONAL SHAKE PLUS PO) Take 237 mLs by mouth in the morning, at noon, and at bedtime. Might Shakes (0900, 1200 & 1700)     pantoprazole (  PROTONIX) 40 MG tablet Take 1 tablet (40 mg total) by mouth daily. 30 tablet 3   potassium chloride 20 MEQ TBCR Take 20 mEq by mouth daily. 30 tablet 0   rosuvastatin (CRESTOR) 40 MG tablet Take 40 mg by mouth every evening. (2100)     thiamine 100 MG tablet Take 1 tablet (100 mg total) by mouth daily. 30 tablet 0   traZODone (DESYREL) 50 MG tablet Take 50 mg by mouth daily at 8 pm.     umeclidinium bromide (INCRUSE ELLIPTA) 62.5 MCG/ACT AEPB Inhale 1 puff into the lungs daily.     vitamin E 400 UNIT capsule Take 400 Units by mouth in the morning.     No current facility-administered medications for this  visit.   Allergies:  Codeine   Social History: The patient  reports that he has been smoking cigarettes. He has a 10.00 pack-year smoking history. He has never used smokeless tobacco. He reports that he does not currently use alcohol after a past usage of about 12.0 standard drinks of alcohol per week. He reports that he does not use drugs.   Family History: The patient's family history includes Congestive Heart Failure in his sister; Congestive Heart Failure (age of onset: 55) in his mother; Heart attack (age of onset: 58) in his father.   ROS:  Please see the history of present illness. Otherwise, complete review of systems is positive for none.  All other systems are reviewed and negative.   Physical Exam: VS:  BP (!) 144/88   Ht '5\' 3"'$  (1.6 m)   Wt 142 lb 3.2 oz (64.5 kg)   SpO2 97%   BMI 25.19 kg/m , BMI Body mass index is 25.19 kg/m.  Wt Readings from Last 3 Encounters:  07/05/22 142 lb 3.2 oz (64.5 kg)  06/07/22 138 lb (62.6 kg)  09/02/21 138 lb 3.7 oz (62.7 kg)    General: Patient appears comfortable at rest. HEENT: Conjunctiva and lids normal, oropharynx clear with moist mucosa. Neck: Supple, no elevated JVP or carotid bruits, no thyromegaly. Lungs: Clear to auscultation, nonlabored breathing at rest. Cardiac: Regular rate and rhythm, no S3 or significant systolic murmur, no pericardial rub. Abdomen: Soft, nontender, no hepatomegaly, bowel sounds present, no guarding or rebound. Extremities: No pitting edema, distal pulses 2+. Skin: Warm and dry. Musculoskeletal: No kyphosis. Neuropsychiatric: Alert and oriented x3  ECG:    Recent Labwork: 07/12/2021: Magnesium 2.0 08/31/2021: ALT 40; AST 36; BUN 17; Creatinine, Ser 1.14; Hemoglobin 10.8; Platelets 405; Potassium 4.5; Sodium 139  No results found for: "CHOL", "TRIG", "HDL", "CHOLHDL", "VLDL", "LDLCALC", "LDLDIRECT"  Other Studies Reviewed Today:   Assessment and Plan: Patient is a 67 year old M known to have carotid  artery stents, PAD s/p PVI (follows up with vascular surgery), CAD (45% LCx stenosis per prior documentation) presents to the cardiology clinic for follow-up visit and also for preop cardiac risk stratification for teeth extraction under general anesthesia.  # Preop cardiac risk stratification for teeth extraction under general anesthesia -Patient denied any DOE or angina. He is wheelchair-bound but does get up and walk with a walker multiple times throughout the day with no symptoms.  METs less than 4. Due to significant history of atherosclerotic vascular disease in the noncoronary vascular bed, I will obtain a nuclear stress test and rule out any high risk findings. If he does not have any high risk findings, he will be at a moderate risk for any perioperative cardiac complications. Will obtain 2D echocardiogram  but does not have to be resulted prior to the procedure. -Plavix can be held 5 days prior to presentation and aspirin can be continued throughout the perioperative.  No need to hold aspirin.  # Carotid artery stents # Peripheral vascular stents in the left legs # Multiple strokes currently wheelchair-bound but walks with a walker at home # CAD (45% LCx disease in the past) -Continue aspirin 81 mg once daily and Plavix 75 mg once daily. -Continue rosuvastatin 40 mg nightly and ezetimibe 10 mg nightly. -Obtain 2D echocardiogram  # HLD -Continue rosuvastatin 40 mg nightly and ezetimibe 10 mg nightly.  Goal LDL less than 55.  # HTN, controlled -Continue metoprolol succinate 12.5 mg twice daily. -Management of HTN per PCP  I have spent a total of 30 minutes with patient reviewing chart , telemetry, EKGs, labs and examining patient as well as establishing an assessment and plan that was discussed with the patient.  > 50% of time was spent in direct patient care.    Medication Adjustments/Labs and Tests Ordered: Current medicines are reviewed at length with the patient today.  Concerns  regarding medicines are outlined above.   Tests Ordered: Orders Placed This Encounter  Procedures   EKG 12-Lead    Medication Changes: No orders of the defined types were placed in this encounter.   Disposition:  Follow up  6 months  Signed, Lazar Tierce Fidel Levy, MD, 07/05/2022 8:02 AM    [facility] [address], [place], [place] [address]

## 2022-07-05 NOTE — Patient Instructions (Signed)
Medication Instructions:  Your physician recommends that you continue on your current medications as directed. Please refer to the Current Medication list given to you today.   Labwork: None  Testing/Procedures: Your physician has requested that you have an echocardiogram. Echocardiography is a painless test that uses sound waves to create images of your heart. It provides your doctor with information about the size and shape of your heart and how well your heart's chambers and valves are working. This procedure takes approximately one hour. There are no restrictions for this procedure. Please do NOT wear cologne, perfume, aftershave, or lotions (deodorant is allowed). Please arrive 15 minutes prior to your appointment time.  Your physician has requested that you have a lexiscan myoview. For further information please visit [email or web address]. Please follow instruction sheet, as given.   Follow-Up: Follow up with Dr. Dellia Cloud in 6 months.   Any Other Special Instructions Will Be Listed Below (If Applicable).     If you need a refill on your cardiac medications before your next appointment, please call your pharmacy.

## 2022-07-06 ENCOUNTER — Other Ambulatory Visit: Payer: Self-pay | Admitting: Internal Medicine

## 2022-07-06 DIAGNOSIS — Z01810 Encounter for preprocedural cardiovascular examination: Secondary | ICD-10-CM

## 2022-07-06 DIAGNOSIS — I159 Secondary hypertension, unspecified: Secondary | ICD-10-CM

## 2022-07-06 DIAGNOSIS — I2510 Atherosclerotic heart disease of native coronary artery without angina pectoris: Secondary | ICD-10-CM

## 2022-07-08 DIAGNOSIS — Z01810 Encounter for preprocedural cardiovascular examination: Secondary | ICD-10-CM | POA: Insufficient documentation

## 2022-07-12 ENCOUNTER — Encounter (HOSPITAL_COMMUNITY)
Admission: RE | Admit: 2022-07-12 | Discharge: 2022-07-12 | Disposition: A | Discharge status: Home / Self Care | Payer: Medicare Other | Source: Ambulatory Visit | Attending: Internal Medicine | Admitting: Internal Medicine

## 2022-07-12 ENCOUNTER — Encounter (HOSPITAL_COMMUNITY): Payer: Self-pay

## 2022-07-12 ENCOUNTER — Ambulatory Visit (HOSPITAL_COMMUNITY)
Admission: RE | Admit: 2022-07-12 | Discharge: 2022-07-12 | Disposition: A | Discharge status: Home / Self Care | Payer: Medicare Other | Source: Ambulatory Visit | Attending: Internal Medicine | Admitting: Internal Medicine

## 2022-07-12 DIAGNOSIS — Z01810 Encounter for preprocedural cardiovascular examination: Secondary | ICD-10-CM | POA: Diagnosis present

## 2022-07-12 LAB — NM MYOCAR MULTI W/SPECT W/WALL MOTION / EF
LV dias vol: 104 mL (ref 62–150)
LV sys vol: 44 mL
Nuc Stress EF: 57 %
Peak HR: 85 {beats}/min
RATE: 0.4
Rest HR: 62 {beats}/min
Rest Nuclear Isotope Dose: 11 mCi
SDS: 3
SRS: 11
SSS: 14
ST Depression (mm): 0 mm
Stress Nuclear Isotope Dose: 30 mCi
TID: 1.11

## 2022-07-12 MED ORDER — SODIUM CHLORIDE FLUSH 0.9 % IV SOLN
INTRAVENOUS | Status: AC
  Administered 2022-07-12: 10 mL via INTRAVENOUS
  Filled 2022-07-12: qty 10

## 2022-07-12 MED ORDER — TECHNETIUM TC 99M TETROFOSMIN IV KIT
30.0000 | PACK | Freq: Once | INTRAVENOUS | Status: AC | PRN
  Administered 2022-07-12: 30 via INTRAVENOUS

## 2022-07-12 MED ORDER — REGADENOSON 0.4 MG/5ML IV SOLN
INTRAVENOUS | Status: AC
  Administered 2022-07-12: 0.4 mg via INTRAVENOUS
  Filled 2022-07-12: qty 5

## 2022-07-12 MED ORDER — TECHNETIUM TC 99M TETROFOSMIN IV KIT
10.0000 | PACK | Freq: Once | INTRAVENOUS | Status: AC | PRN
  Administered 2022-07-12: 11 via INTRAVENOUS

## 2022-07-13 NOTE — Telephone Encounter (Addendum)
   Patient Name: George Valdez  DOB: [birth year] MRN: [id number]  Primary Cardiologist: Kate Sable, MD (Inactive)  Chart reviewed as part of pre-operative protocol coverage. Given past medical history and time since last visit, based on ACC/AHA guidelines, George Valdez is at acceptable risk for the planned procedure without further cardiovascular testing.  Patient recently underwent nuclear stress test that was low risk and per primary cardiologist patient is at acceptable risk for proceeding with dental extraction.  SBE prophylaxis is not indicated for procedure.   Please reach out to prescribing MD for parameters on holding Plavix.  This medication is not prescribed by his cardiologist.  I will route this recommendation to the requesting party via [place] fax function and remove from pre-op pool.  Please call with questions.  Mable Fill, Marissa Nestle, NP 07/13/2022, 8:55 AM

## 2022-07-18 ENCOUNTER — Ambulatory Visit: Payer: Medicare Other | Attending: Internal Medicine

## 2022-07-18 DIAGNOSIS — I2583 Coronary atherosclerosis due to lipid rich plaque: Secondary | ICD-10-CM | POA: Diagnosis present

## 2022-07-18 DIAGNOSIS — I2510 Atherosclerotic heart disease of native coronary artery without angina pectoris: Secondary | ICD-10-CM | POA: Insufficient documentation

## 2022-07-18 LAB — ECHOCARDIOGRAM COMPLETE
AR max vel: 1.4 cm2
AV Peak grad: 10.2 mmHg
Ao pk vel: 1.6 m/s
Area-P 1/2: 4.21 cm2
Calc EF: 54.4 %
MV M vel: 6.45 m/s
MV Peak grad: 166.2 mmHg
MV Vena cont: 0.1 cm
Radius: 0.3 cm
S' Lateral: 3.4 cm
Single Plane A2C EF: 52.3 %
Single Plane A4C EF: 56 %

## 2022-08-05 ENCOUNTER — Telehealth: Payer: Self-pay | Admitting: Internal Medicine

## 2022-08-05 NOTE — Telephone Encounter (Signed)
   Pre-operative Risk Assessment    Patient Name: George Valdez  DOB: 01-12-1956 MRN: [id number]     Request for Surgical Clearance    Procedure:  Dental extraction   several teeth have to be cut out of gums- 6 teeth  Date of Surgery:    TBD                                 Surgeon:  Dr Virginia Rochester Surgeon's Group or Practice Name:   Phone number:  [phone] Fax number:  [phone]   Type of Clearance Requested:   - Medical    Type of Anesthesia:  General    Additional requests/questions:    Signed, Glyn Ade   08/05/2022, 2:19 PM

## 2022-08-05 NOTE — Telephone Encounter (Signed)
   Patient Name: George Valdez  DOB: 03/06/1956 MRN: [id number]  Primary Cardiologist: Kate Sable, MD (Inactive)  Chart reviewed as part of pre-operative protocol coverage. Given past medical history and time since last visit, based on ACC/AHA guidelines, [name] is at acceptable risk for the planned procedure without further cardiovascular testing.  Patient recently underwent nuclear stress test that was low risk and per primary cardiologist patient is at acceptable risk for proceeding with dental extraction.  SBE prophylaxis is not indicated for procedure.   Plavix can be held 5 days prior to presentation and aspirin can be continued throughout the perioperative. No need to hold aspirin.   I will route this recommendation to the requesting party via Epic fax function and remove from pre-op pool.  Please call with questions.  Mable Fill, Marissa Nestle, NP 08/05/2022, 3:02 PM

## 2022-08-29 ENCOUNTER — Encounter (HOSPITAL_COMMUNITY): Payer: Self-pay | Admitting: Oral Surgery

## 2022-08-29 NOTE — H&P (Signed)
   Patient: George Valdez  PID: [id number]  DOB: 12/10/1955  SEX: Male   Patient referred by DDS for extraction all remaining teeth  CC: bad teeth.   Past Medical History:  TIA, Rhabdomyolysis, Hyperlipidemia, PVD, CKD, CAD, Diabetes    Medications: Tylenol, ASA, Benadryl, Duloxatene, Ezetimibe, Incruse Ellipta, Metoprolol, Plavix, Potassium, Protonix, Rosuvastatin, trazadone    Allergies:     Codeine    Surgeries:   Colonoscopy, EGD, Carotid Endarterectomy     Social History       Smoking: 1/2 ppd           Alcohol:n Drug use:n                             Exam: BMI 26. Gross decay all remaining teeth # 6-15, 20-29. Bilateral mandibular lingual tori.  No purulence, edema, fluctuance, trismus. Oral cancer screening negative. Pharynx clear. No lymphadenopathy.  Panorex:Gross decay all remaining teeth # 6-15, 20-29.  Assessment: ASA 3. Non-restorable   teeth # 6-15, 20-29.            Plan: 1. Cardiac clearance received. OK to D/C Plavix. 2. Extraction Teeth # 6-15, 20-29, Alveoloplasty, removal lingual tori.  Hospital Day surgery.                 Rx: n               Risks and complications explained. Questions answered.   Gae Bon, DMD

## 2022-08-29 NOTE — Progress Notes (Addendum)
Surgical Instructions for George Valdez Date of Surgery:  August 31, 2022 Surgery is scheduled from 8:30 AM - 9:30 AM.    Your procedure is scheduled on Wednesday, 08/31/22.  Report to [facility] Main Entrance "A" at  6 A.M., then check in with the Admitting office.  Call this number if you have problems the morning of surgery:  [phone]   If you have any questions prior to your surgery date call [phone]: Open Monday-Friday 8am-4pm If you experience any cold or flu symptoms such as cough, fever, chills, shortness of breath, etc. between now and your scheduled surgery, please notify us at the above number     Remember:  Do not eat or drink after midnight the night before your surgery (Tuesday)    Take these medicines the morning of surgery (Wed.) with A SIP OF WATER:  acetaminophen (TYLENOL) 325 MG  if needed DULoxetine (CYMBALTA)  metoprolol succinate (TOPROL-XL) 25 MG  pantoprazole (PROTONIX)  umeclidinium bromide (INCRUSE ELLIPTA INHALER   As of today, STOP taking any Aspirin (unless otherwise instructed by your surgeon) Aleve, Naproxen, Ibuprofen, Motrin, Advil, Goody's, BC's, all herbal medications, fish oil, and all vitamins.   Blood Thinner Instructions:  Stop Plavix 5 days prior to procedure per MD.  Last day was on 08/10/22 per facility.  Aspirin Instructions: Follow your surgeon's instructions on when to stop aspirin prior to surgery,  If no instructions were given by your surgeon then you will need to call the office for those instructions.          Do not wear jewelry  Do not wear lotions, powders, cologne or deodorant. Men may shave face and neck. Do not bring valuables to the hospital.  [facility] is not responsible for any belongings or valuables.    Do NOT Smoke (Tobacco/Vaping)  24 hours prior to your procedure  If you use a CPAP at night, you may bring your mask for your overnight stay.   Contacts, glasses, hearing aids, dentures or partials  may not be worn into surgery, please bring cases for these belongings   Patients discharged the day of surgery will not be allowed to drive home, and someone needs to stay with them for 24 hours.   SURGICAL WAITING ROOM VISITATION Patients having surgery or a procedure may have no more than 2 support people in the waiting area - these visitors may rotate.   Children under the age of 57 must have an adult with them who is not the patient. If the patient needs to stay at the hospital during part of their recovery, the visitor guidelines for inpatient rooms apply. Pre-op nurse will coordinate an appropriate time for 1 support person to accompany patient in pre-op.  This support person may not rotate.   Please refer to [email or web address].hu for the visitor guidelines for Inpatients (after your surgery is over and you are in a regular room).    Special instructions:    Oral Hygiene is also important to reduce your risk of infection.  Remember - BRUSH YOUR TEETH THE MORNING OF SURGERY WITH YOUR REGULAR TOOTHPASTE

## 2022-08-29 NOTE — Anesthesia Preprocedure Evaluation (Signed)
Anesthesia Evaluation  Patient identified by MRN, date of birth, ID band Patient awake    Reviewed: Allergy & Precautions, NPO status , Patient's Chart, lab work & pertinent test results  History of Anesthesia Complications Negative for: history of anesthetic complications  Airway Mallampati: III  TM Distance: >3 FB Neck ROM: Full    Dental  (+) Poor Dentition, Dental Advisory Given   Pulmonary neg shortness of breath, asthma , COPD (used inhaler this morning),  COPD inhaler, neg recent URI, Current Smoker and Patient abstained from smoking.   Pulmonary exam normal breath sounds clear to auscultation       Cardiovascular hypertension (metoprolol), Pt. on home beta blockers (-) angina + CAD, + Past MI and + Peripheral Vascular Disease  (-) dysrhythmias  Rhythm:Regular Rate:Normal  HLD, s/p CEA  Echo 07/18/22: IMPRESSIONS   1. Left ventricular ejection fraction, by estimation, is 55 to 60%. The  left ventricle has normal function. Left ventricular endocardial border  not optimally defined to evaluate regional wall motion. Left ventricular  diastolic parameters are consistent  with Grade I diastolic dysfunction (impaired relaxation). The average left  ventricular global longitudinal strain is -22.3 %. The global longitudinal  strain is normal.   2. Right ventricular systolic function is low normal. The right  ventricular size is normal.   3. The mitral valve is abnormal. There are two jets of mitral valve  regurgitation, mild and trivial. No evidence of mitral stenosis. Mild to  moderate mitral annular calcification.   4. The aortic valve was not well visualized. There is mild calcification  of the aortic valve. Aortic valve regurgitation is not visualized. No  aortic stenosis is present.   5. The inferior vena cava is normal in size with greater than 50%  respiratory variability, suggesting right atrial pressure of 3 mmHg.  -  Comparison(s): No significant change from prior study.    Nuclear stress test 07/12/22:   Findings are consistent with prior inferior infarction mild with peri-infarct ischemia. The study is low risk.   No ST deviation was noted.   LV perfusion is abnormal. Large moderate intensity inferior defect with mild reversibility.   Left ventricular function is normal. Nuclear stress EF: 57 %. The left ventricular ejection fraction is normal (55-65%). End diastolic cavity size is normal.     Neuro/Psych neg Seizures PSYCHIATRIC DISORDERS Anxiety Depression    Chronic pain  Neuromuscular disease (neuropathy) CVA (x3, left-sided weakness), Residual Symptoms    GI/Hepatic ,GERD  Medicated,,(+)     substance abuse (sober x5 years)  alcohol use  Endo/Other  diabetes, Type 2    Renal/GU      Musculoskeletal   Abdominal   Peds  Hematology  (+) Blood dyscrasia, anemia   Anesthesia Other Findings Wheelchair dependent  Last Plavix: 08/25/2022  Reproductive/Obstetrics                              Anesthesia Physical Anesthesia Plan  ASA: 3  Anesthesia Plan: General   Post-op Pain Management: Tylenol PO (pre-op)*   Induction: Intravenous  PONV Risk Score and Plan: 1 and Ondansetron, Dexamethasone and Treatment may vary due to age or medical condition  Airway Management Planned: Nasal ETT  Additional Equipment:   Intra-op Plan:   Post-operative Plan: Extubation in OR  Informed Consent: I have reviewed the patients History and Physical, chart, labs and discussed the procedure including the risks, benefits and alternatives for the  proposed anesthesia with the patient or authorized representative who has indicated his/her understanding and acceptance.     Dental advisory given  Plan Discussed with: CRNA and Anesthesiologist  Anesthesia Plan Comments: (PAT note written 08/29/2022 by Myra Gianotti, PA-C.  Risks of general anesthesia discussed  including, but not limited to, sore throat, hoarse voice, chipped/damaged teeth, injury to vocal cords, nausea and vomiting, allergic reactions, lung infection, heart attack, stroke, and death. All questions answered.   )        Anesthesia Quick Evaluation

## 2022-08-29 NOTE — Progress Notes (Signed)
Anesthesia Chart Review: George Valdez  Case: [id number] Date/Time: 08/31/22 0815   Procedure: DENTAL RESTORATION/EXTRACTIONS   Anesthesia type: General   Pre-op diagnosis: NON RESTORABLE   Location: [facility] OR ROOM 06 / [facility]   Surgeons: Diona Browner, DMD       DISCUSSION: Patient is a 67 year old male scheduled for the above procedure. Procedure initially scheduled for 06/10/2022, but delayed until he could have a preoperative cardiology evaluation.   History includes smoking, COPD, CAD/MI, HTN, HLD, pernicous anemia, alcohol abuse (sober for ~ 5 years), CVA (left hemiparesis), carotid artery stenosis (s/p bilateral carotid endarterectomies), PAD, BPH, neuropathy, chronic pain, frequent falls, anxiety.    [facility] admission 07/10/21-07/16/21 for possible Streptococcus Group G bacteremia (only one or two cultures +), rhabdomyolysis, AKI (Cr 3.08), E. Coli UTI, failure to thrive, transaminitis (likely from dehydration/rhabdo), anemia (HGB 6.7, s/p 2 units PRBC, had out-patient EGD/colonoscopy 09/02/21 ). 07/12/21 echo showed thickening of distal anterior MV leaflet likely represents calcification, cannot exclude small vegetation. Consider TEE if concern for endocarditis, but repeat cultures negative x2 and completed course of Rocephin->Keflex. Discharged to [facility] for further care and rehabilitation. LFTs normalized and Creatinine 1.14 by 08/31/21 repeat labs.     Last cardiology evaluation was on 07/05/21 with Dr. Dellia Cloud. He had previously been evaluated by Kate Sable, MD on 07/22/19. Then he noted that patient was not a great historian, but had reported history of CVA x3, CAD and MI ("45% blockage in the left circumflex coronary artery"), carotid and LE "stents" previously followed by "Dr. Laverle Patter in [place], [place].". Carotid Dopplers in 2019 showed 1-39% RICA and 123456 LICA with evidence of prior carotid endarterectomies and not carotid stents. At most  recent visit visit, he was noted to be primarily wheelchair bond due to his previous strokes but abel to transfer and walk with a walker. Because of METS < 4 a preoperative stress test was ordered. An echo also ordered.  07/12/22 nuclear stress test was consistent with prior inferior infarction mild with peri-infarct ischemia, EF 57% but was consider low risk. Echo on 07/18/22 showed normal LVEF, mild MR. Per Dr. Dellia Cloud, "This is a low risk study. He is at a moderate risk for any perioperative cardiac complications in case he undergoes his extraction under general anesthesia. No further cardiac workup is indicated prior to proceeding with the planned procedure. Follow-up in 6 months (already has appointment)." Additional input per Ambrose Pancoast, NP on 08/05/22, "Patient recently underwent nuclear stress test that was low risk and per primary cardiologist patient is at acceptable risk for proceeding with dental extraction.  SBE prophylaxis is not indicated for procedure.    Plavix can be held 5 days prior to presentation and aspirin can be continued throughout the perioperative. No need to hold aspirin." Reported last Plavix 08/10/22 pre facility.   He is a same day work-up. Labs and anesthesia team evaluation on the day of surgery.     VS: Ht '5\' 3"'$  (1.6 m)   Wt 64.5 kg   BMI 25.19 kg/m  BP Readings from Last 3 Encounters:  07/05/22 (!) 144/88  06/07/22 (!) 151/77  09/02/21 (!) 96/56   Pulse Readings from Last 3 Encounters:  07/05/22 63  06/07/22 71  09/02/21 73     PROVIDERS: Patient resides at [facility]. Dr. Lupita Leash staff reported PCP is listed as Dr. Caprice Renshaw.  Claudina Lick, MD is cardiologist   Jamelle Haring, MD is  vascular surgeon. He was evaluated on 06/07/22 for "cold feet". No significant symptoms. No palpable pedal pulses. Minimally ambulatory (transfers from bed to wheelchair). No peripheral ulcers. ABIs suggested moderate PAD, abnormal  toe-branchial indexes. Advised smoking cessation, glucose/BP control, statin, continue ASA and Plavix. As needed follow-up.   Donato Heinz, MD is nephrologist  Hurshel Keys, DO is GI   LABS: Labs as indicated on the day of surgery.    OTHER: EGD 09/02/21:  IMPRESSION: - Z-line regular, 39 cm from the incisors. - Erythematous mucosa in the antrum. - Normal duodenal bulb, first portion of the duodenum and second portion of the duodenum. - No specimens collected.   Colonoscopy 09/02/21: IMPRESSION: - Preparation of the colon was fair. - Internal hemorrhoids. - Diverticulosis in the sigmoid colon. - Nodular mucosa in the transverse colon and in the ascending colon. Biopsied. - Four 3 to 6 mm polyps in the sigmoid colon and in the descending colon, removed with a cold snare. Resected and retrieved. - One 2 mm polyp in the descending colon, removed with a cold biopsy forceps. Resected and retrieved. - The examination was otherwise normal. Pathology report reviewed by Dr. Abbey Chatters, "Please let patient know polyps removed were tubular adenoma.  Recommend repeat colonoscopy in 3 years.   Biopsies of the colon showed acute/subacute nonspecific inflammation of his colon.  This could possibly be from infection, drug effect such as NSAIDs.  Given his location, ischemia or lack of blood flow to the colon also on differential.     He did not mention any abdominal pain or diarrhea to me during our visit.  Bowels moving well on MiraLAX.  I think we can continue to monitor for now."       EKG:  EKG 07/05/22 ([facility]): Normal sinus rhythm Left axis deviation Voltage criteria for left ventricular hypertrophy Negative T waves in V6 and inferior leads although baseline wander in limb leads may effect interpretation    CV: Echo 07/18/22: IMPRESSIONS   1. Left ventricular ejection fraction, by estimation, is 55 to 60%. The  left ventricle has normal function. Left ventricular  endocardial border  not optimally defined to evaluate regional wall motion. Left ventricular  diastolic parameters are consistent  with Grade I diastolic dysfunction (impaired relaxation). The average left  ventricular global longitudinal strain is -22.3 %. The global longitudinal  strain is normal.   2. Right ventricular systolic function is low normal. The right  ventricular size is normal.   3. The mitral valve is abnormal. There are two jets of mitral valve  regurgitation, mild and trivial. No evidence of mitral stenosis. Mild to  moderate mitral annular calcification.   4. The aortic valve was not well visualized. There is mild calcification  of the aortic valve. Aortic valve regurgitation is not visualized. No  aortic stenosis is present.   5. The inferior vena cava is normal in size with greater than 50%  respiratory variability, suggesting right atrial pressure of 3 mmHg.  - Comparison(s): No significant change from prior study.    Nuclear stress test 07/12/22:   Findings are consistent with prior inferior infarction mild with peri-infarct ischemia. The study is low risk.   No ST deviation was noted.   LV perfusion is abnormal. Large moderate intensity inferior defect with mild reversibility.   Left ventricular function is normal. Nuclear stress EF: 57 %. The left ventricular ejection fraction is normal (55-65%). End diastolic cavity size is normal.   ABI 06/07/22: Summary:  - Right:  Resting right ankle-brachial index indicates moderate right lower extremity arterial disease. The right toe-brachial index is abnormal.  - Left: Resting left ankle-brachial index indicates moderate left lower extremity arterial disease. The left toe-brachial index is abnormal.      US Carotid 02/07/18: Final Interpretation:  - Right Carotid: Velocities in the right ICA are consistent with a 1-39% stenosis. The ECA appears >50% stenosed.  - Left Carotid: Velocities in the left ICA are consistent  with a 40-59% stenosis. Non-hemodynamically significant plaque noted in the CCA.  - Vertebrals:  Right vertebral artery demonstrates antegrade flow. Left vertebral artery demonstrates high resistant flow.  - Subclavians: Normal flow hemodynamics were seen in bilateral subclavian arteries.     Past Medical History:  Diagnosis Date   Alcohol abuse    Anxiety    Asthma    BPH (benign prostatic hyperplasia)    Chronic pain    Constipation    COPD (chronic obstructive pulmonary disease) (HCC)    Coronary artery disease    w/ stents   Depression    Diabetes mellitus without complication ([place])    type 2 no meds, diet controlled   Frequent falls    Hyperlipemia    Hypertension    Myocardial infarction ([place])    Neuropathy    Pernicious anemia    Stroke ([place])    hx 3 strokes, left sided weakness   Uses walker    a couple times a day to move around   Wheelchair dependent     Past Surgical History:  Procedure Laterality Date   BIOPSY  09/02/2021   Procedure: BIOPSY;  Surgeon: Eloise Harman, DO;  Location: [facility];  Service: Endoscopy;;   CARDIAC CATHETERIZATION     CAROTID ENDARTERECTOMY     COLONOSCOPY WITH PROPOFOL N/A 09/02/2021   Procedure: COLONOSCOPY WITH PROPOFOL;  Surgeon: Eloise Harman, DO;  Location: [facility];  Service: Endoscopy;  Laterality: N/A;  11:45am   ESOPHAGOGASTRODUODENOSCOPY (EGD) WITH PROPOFOL N/A 09/02/2021   Procedure: ESOPHAGOGASTRODUODENOSCOPY (EGD) WITH PROPOFOL;  Surgeon: Eloise Harman, DO;  Location: [facility];  Service: Endoscopy;  Laterality: N/A;   POLYPECTOMY  09/02/2021   Procedure: POLYPECTOMY;  Surgeon: Eloise Harman, DO;  Location: [facility];  Service: Endoscopy;;    MEDICATIONS: No current facility-administered medications for this encounter.    aspirin EC 81 MG tablet   acetaminophen (TYLENOL) 325 MG tablet   clopidogrel (PLAVIX) 75 MG tablet   cyanocobalamin (,VITAMIN B-12,) 1000 MCG/ML injection   DULoxetine  (CYMBALTA) 60 MG capsule   ezetimibe (ZETIA) 10 MG tablet   melatonin 5 MG TABS   metoprolol succinate (TOPROL-XL) 25 MG 24 hr tablet   Multiple Vitamin (MULTIVITAMIN WITH MINERALS) TABS tablet   Nutritional Supplements (NUTRITIONAL SHAKE PLUS PO)   pantoprazole (PROTONIX) 40 MG tablet   potassium chloride 20 MEQ TBCR   rosuvastatin (CRESTOR) 40 MG tablet   thiamine 100 MG tablet   traZODone (DESYREL) 50 MG tablet   umeclidinium bromide (INCRUSE ELLIPTA) 62.5 MCG/ACT AEPB   vitamin E 400 UNIT capsule    Myra Gianotti, PA-C Surgical Short Stay/Anesthesiology [facility] Phone [phone] [facility] Phone [phone] 08/29/2022 6:14 PM

## 2022-08-29 NOTE — Progress Notes (Signed)
PCP - Dr. Caprice Renshaw Cardiologist - Dr Claudina Lick (previously Dr Kate Sable)  Chest x-ray - 07/10/21 (1V) EKG - 07/05/22 Stress Test - 07/12/22 ECHO - 07/18/22 Cardiac Cath - 07/2016  ICD Pacemaker/Loop - n/a  Sleep Study -  n/a CPAP - none  Diabetes Type 2 - diet controlled, no meds  Blood Thinner Instructions:  Hold Plavix 5 days prior procedure per MD.  See 08/05/22 TE Note.    Aspirin Instructions: Ok to continue ASA per MD.  See 08/05/22 TE Note.  NPO  Anesthesia review: Yes  STOP now taking any Aspirin (unless otherwise instructed by your surgeon), Aleve, Naproxen, Ibuprofen, Motrin, Advil, Goody's, BC's, all herbal medications, fish oil, and all vitamins.   Coronavirus Screening Do you have any of the following symptoms:  Cough yes/no: No Fever (>100.20F)  yes/no: No Runny nose yes/no: No Sore throat yes/no: No Difficulty breathing/shortness of breath  yes/no: No  Have you traveled in the last 14 days and where? yes/no: No  Patient verbalized understanding of instructions that were given via phone.

## 2022-08-31 ENCOUNTER — Encounter (HOSPITAL_COMMUNITY): Payer: Self-pay | Admitting: Oral Surgery

## 2022-08-31 ENCOUNTER — Ambulatory Visit (HOSPITAL_COMMUNITY): Payer: Medicare Other | Admitting: Physician Assistant

## 2022-08-31 ENCOUNTER — Ambulatory Visit (HOSPITAL_BASED_OUTPATIENT_CLINIC_OR_DEPARTMENT_OTHER): Payer: Medicare Other | Admitting: Physician Assistant

## 2022-08-31 ENCOUNTER — Ambulatory Visit (HOSPITAL_COMMUNITY)
Admission: RE | Admit: 2022-08-31 | Discharge: 2022-08-31 | Disposition: A | Discharge status: Home / Self Care | Payer: Medicare Other | Source: Ambulatory Visit | Attending: Oral Surgery | Admitting: Oral Surgery

## 2022-08-31 ENCOUNTER — Encounter (HOSPITAL_COMMUNITY)
Admission: RE | Disposition: A | Discharge status: Home / Self Care | Payer: Self-pay | Source: Ambulatory Visit | Attending: Oral Surgery

## 2022-08-31 ENCOUNTER — Other Ambulatory Visit: Payer: Self-pay

## 2022-08-31 DIAGNOSIS — K0889 Other specified disorders of teeth and supporting structures: Secondary | ICD-10-CM

## 2022-08-31 DIAGNOSIS — Z79899 Other long term (current) drug therapy: Secondary | ICD-10-CM | POA: Diagnosis not present

## 2022-08-31 DIAGNOSIS — N189 Chronic kidney disease, unspecified: Secondary | ICD-10-CM | POA: Insufficient documentation

## 2022-08-31 DIAGNOSIS — K029 Dental caries, unspecified: Secondary | ICD-10-CM | POA: Diagnosis not present

## 2022-08-31 DIAGNOSIS — I252 Old myocardial infarction: Secondary | ICD-10-CM | POA: Insufficient documentation

## 2022-08-31 DIAGNOSIS — E785 Hyperlipidemia, unspecified: Secondary | ICD-10-CM | POA: Diagnosis not present

## 2022-08-31 DIAGNOSIS — F17200 Nicotine dependence, unspecified, uncomplicated: Secondary | ICD-10-CM | POA: Diagnosis not present

## 2022-08-31 DIAGNOSIS — G8929 Other chronic pain: Secondary | ICD-10-CM | POA: Insufficient documentation

## 2022-08-31 DIAGNOSIS — E1151 Type 2 diabetes mellitus with diabetic peripheral angiopathy without gangrene: Secondary | ICD-10-CM | POA: Insufficient documentation

## 2022-08-31 DIAGNOSIS — I129 Hypertensive chronic kidney disease with stage 1 through stage 4 chronic kidney disease, or unspecified chronic kidney disease: Secondary | ICD-10-CM | POA: Diagnosis not present

## 2022-08-31 DIAGNOSIS — I69354 Hemiplegia and hemiparesis following cerebral infarction affecting left non-dominant side: Secondary | ICD-10-CM | POA: Insufficient documentation

## 2022-08-31 DIAGNOSIS — J4489 Other specified chronic obstructive pulmonary disease: Secondary | ICD-10-CM | POA: Diagnosis not present

## 2022-08-31 DIAGNOSIS — I2510 Atherosclerotic heart disease of native coronary artery without angina pectoris: Secondary | ICD-10-CM | POA: Insufficient documentation

## 2022-08-31 DIAGNOSIS — F17210 Nicotine dependence, cigarettes, uncomplicated: Secondary | ICD-10-CM

## 2022-08-31 DIAGNOSIS — F418 Other specified anxiety disorders: Secondary | ICD-10-CM | POA: Insufficient documentation

## 2022-08-31 DIAGNOSIS — E1122 Type 2 diabetes mellitus with diabetic chronic kidney disease: Secondary | ICD-10-CM | POA: Diagnosis not present

## 2022-08-31 DIAGNOSIS — I10 Essential (primary) hypertension: Secondary | ICD-10-CM

## 2022-08-31 HISTORY — DX: Dependence on other enabling machines and devices: Z99.89

## 2022-08-31 HISTORY — DX: Constipation, unspecified: K59.00

## 2022-08-31 HISTORY — DX: Type 2 diabetes mellitus without complications: E11.9

## 2022-08-31 HISTORY — DX: Dependence on wheelchair: Z99.3

## 2022-08-31 HISTORY — PX: TOOTH EXTRACTION: SHX859

## 2022-08-31 LAB — CBC
HCT: 43.5 % (ref 39.0–52.0)
Hemoglobin: 14.2 g/dL (ref 13.0–17.0)
MCH: 29.8 pg (ref 26.0–34.0)
MCHC: 32.6 g/dL (ref 30.0–36.0)
MCV: 91.2 fL (ref 80.0–100.0)
Platelets: 232 10*3/uL (ref 150–400)
RBC: 4.77 MIL/uL (ref 4.22–5.81)
RDW: 16.8 % — ABNORMAL HIGH (ref 11.5–15.5)
WBC: 11.5 10*3/uL — ABNORMAL HIGH (ref 4.0–10.5)
nRBC: 0 % (ref 0.0–0.2)

## 2022-08-31 LAB — COMPREHENSIVE METABOLIC PANEL
ALT: 20 U/L (ref 0–44)
AST: 24 U/L (ref 15–41)
Albumin: 2.9 g/dL — ABNORMAL LOW (ref 3.5–5.0)
Alkaline Phosphatase: 82 U/L (ref 38–126)
Anion gap: 7 (ref 5–15)
BUN: 16 mg/dL (ref 8–23)
CO2: 24 mmol/L (ref 22–32)
Calcium: 9.2 mg/dL (ref 8.9–10.3)
Chloride: 107 mmol/L (ref 98–111)
Creatinine, Ser: 1.64 mg/dL — ABNORMAL HIGH (ref 0.61–1.24)
GFR, Estimated: 46 mL/min — ABNORMAL LOW (ref >60)
Glucose, Bld: 107 mg/dL — ABNORMAL HIGH (ref 70–99)
Potassium: 3.8 mmol/L (ref 3.5–5.1)
Sodium: 138 mmol/L (ref 135–145)
Total Bilirubin: 0.7 mg/dL (ref 0.3–1.2)
Total Protein: 6.5 g/dL (ref 6.5–8.1)

## 2022-08-31 LAB — GLUCOSE, CAPILLARY
Glucose-Capillary: 105 mg/dL — ABNORMAL HIGH (ref 70–99)
Glucose-Capillary: 115 mg/dL — ABNORMAL HIGH (ref 70–99)

## 2022-08-31 SURGERY — DENTAL RESTORATION/EXTRACTIONS
Anesthesia: General

## 2022-08-31 MED ORDER — PHENYLEPHRINE 80 MCG/ML (10ML) SYRINGE FOR IV PUSH (FOR BLOOD PRESSURE SUPPORT)
PREFILLED_SYRINGE | INTRAVENOUS | Status: AC
  Filled 2022-08-31: qty 10

## 2022-08-31 MED ORDER — SODIUM CHLORIDE 0.9 % IR SOLN
Status: DC | PRN
  Administered 2022-08-31: 1

## 2022-08-31 MED ORDER — PHENYLEPHRINE 80 MCG/ML (10ML) SYRINGE FOR IV PUSH (FOR BLOOD PRESSURE SUPPORT)
PREFILLED_SYRINGE | INTRAVENOUS | Status: DC | PRN
  Administered 2022-08-31 (×2): 80 ug via INTRAVENOUS
  Administered 2022-08-31: 160 ug via INTRAVENOUS
  Administered 2022-08-31 (×2): 80 ug via INTRAVENOUS
  Administered 2022-08-31: 160 ug via INTRAVENOUS
  Administered 2022-08-31 (×2): 80 ug via INTRAVENOUS

## 2022-08-31 MED ORDER — AMOXICILLIN 500 MG PO CAPS: 500.0000 mg | ORAL_CAPSULE | Freq: Three times a day (TID) | ORAL | 0 refills | Status: DC

## 2022-08-31 MED ORDER — MIDAZOLAM HCL 2 MG/2ML IJ SOLN
INTRAMUSCULAR | Status: DC | PRN
  Administered 2022-08-31 (×2): 1 mg via INTRAVENOUS

## 2022-08-31 MED ORDER — 0.9 % SODIUM CHLORIDE (POUR BTL) OPTIME
TOPICAL | Status: DC | PRN
  Administered 2022-08-31: 1000 mL

## 2022-08-31 MED ORDER — ACETAMINOPHEN 500 MG PO TABS
1000.0000 mg | ORAL_TABLET | Freq: Once | ORAL | Status: AC
  Administered 2022-08-31: 1000 mg via ORAL
  Filled 2022-08-31: qty 2

## 2022-08-31 MED ORDER — ONDANSETRON HCL 4 MG/2ML IJ SOLN
INTRAMUSCULAR | Status: AC
  Filled 2022-08-31: qty 2

## 2022-08-31 MED ORDER — SUGAMMADEX SODIUM 200 MG/2ML IV SOLN
INTRAVENOUS | Status: DC | PRN
  Administered 2022-08-31: 200 mg via INTRAVENOUS

## 2022-08-31 MED ORDER — OXYMETAZOLINE HCL 0.05 % NA SOLN
NASAL | Status: AC
  Filled 2022-08-31: qty 30

## 2022-08-31 MED ORDER — PROPOFOL 10 MG/ML IV BOLUS
INTRAVENOUS | Status: AC
  Filled 2022-08-31: qty 20

## 2022-08-31 MED ORDER — OXYMETAZOLINE HCL 0.05 % NA SOLN
NASAL | Status: DC | PRN
  Administered 2022-08-31 (×3): 2 via NASAL

## 2022-08-31 MED ORDER — FENTANYL CITRATE (PF) 250 MCG/5ML IJ SOLN
INTRAMUSCULAR | Status: DC | PRN
  Administered 2022-08-31: 25 ug via INTRAVENOUS
  Administered 2022-08-31: 75 ug via INTRAVENOUS

## 2022-08-31 MED ORDER — MIDAZOLAM HCL 2 MG/2ML IJ SOLN
INTRAMUSCULAR | Status: AC
  Filled 2022-08-31: qty 2

## 2022-08-31 MED ORDER — PROPOFOL 10 MG/ML IV BOLUS
INTRAVENOUS | Status: DC | PRN
  Administered 2022-08-31: 50 mg via INTRAVENOUS
  Administered 2022-08-31: 100 mg via INTRAVENOUS

## 2022-08-31 MED ORDER — DEXAMETHASONE SODIUM PHOSPHATE 10 MG/ML IJ SOLN
INTRAMUSCULAR | Status: AC
  Filled 2022-08-31: qty 1

## 2022-08-31 MED ORDER — EPHEDRINE SULFATE-NACL 50-0.9 MG/10ML-% IV SOSY
PREFILLED_SYRINGE | INTRAVENOUS | Status: DC | PRN
  Administered 2022-08-31 (×5): 5 mg via INTRAVENOUS

## 2022-08-31 MED ORDER — FENTANYL CITRATE (PF) 250 MCG/5ML IJ SOLN
INTRAMUSCULAR | Status: AC
  Filled 2022-08-31: qty 5

## 2022-08-31 MED ORDER — EPHEDRINE 5 MG/ML INJ
INTRAVENOUS | Status: AC
  Filled 2022-08-31: qty 5

## 2022-08-31 MED ORDER — CHLORHEXIDINE GLUCONATE 0.12 % MT SOLN
15.0000 mL | Freq: Once | OROMUCOSAL | Status: AC
  Administered 2022-08-31: 15 mL via OROMUCOSAL
  Filled 2022-08-31: qty 15

## 2022-08-31 MED ORDER — ROCURONIUM BROMIDE 10 MG/ML (PF) SYRINGE
PREFILLED_SYRINGE | INTRAVENOUS | Status: AC
  Filled 2022-08-31: qty 10

## 2022-08-31 MED ORDER — DEXAMETHASONE SODIUM PHOSPHATE 10 MG/ML IJ SOLN
INTRAMUSCULAR | Status: DC | PRN
  Administered 2022-08-31: 5 mg via INTRAVENOUS

## 2022-08-31 MED ORDER — LIDOCAINE 2% (20 MG/ML) 5 ML SYRINGE
INTRAMUSCULAR | Status: DC | PRN
  Administered 2022-08-31: 100 mg via INTRAVENOUS

## 2022-08-31 MED ORDER — ORAL CARE MOUTH RINSE: 15.0000 mL | Freq: Once | OROMUCOSAL | Status: AC

## 2022-08-31 MED ORDER — HYDROCODONE-ACETAMINOPHEN 5-325 MG PO TABS: 1.0000 | ORAL_TABLET | ORAL | 0 refills | Status: DC | PRN

## 2022-08-31 MED ORDER — ROCURONIUM BROMIDE 10 MG/ML (PF) SYRINGE
PREFILLED_SYRINGE | INTRAVENOUS | Status: DC | PRN
  Administered 2022-08-31: 50 mg via INTRAVENOUS

## 2022-08-31 MED ORDER — LIDOCAINE 2% (20 MG/ML) 5 ML SYRINGE
INTRAMUSCULAR | Status: AC
  Filled 2022-08-31: qty 5

## 2022-08-31 MED ORDER — CEFAZOLIN SODIUM-DEXTROSE 2-4 GM/100ML-% IV SOLN
2.0000 g | INTRAVENOUS | Status: AC
  Administered 2022-08-31: 2 g via INTRAVENOUS
  Filled 2022-08-31: qty 100

## 2022-08-31 MED ORDER — LIDOCAINE-EPINEPHRINE 2 %-1:100000 IJ SOLN
INTRAMUSCULAR | Status: DC | PRN
  Administered 2022-08-31: 20 mL

## 2022-08-31 MED ORDER — LIDOCAINE-EPINEPHRINE 2 %-1:100000 IJ SOLN
INTRAMUSCULAR | Status: AC
  Filled 2022-08-31: qty 1

## 2022-08-31 MED ORDER — LACTATED RINGERS IV SOLN: INTRAVENOUS | Status: DC

## 2022-08-31 MED ORDER — ONDANSETRON HCL 4 MG/2ML IJ SOLN
INTRAMUSCULAR | Status: DC | PRN
  Administered 2022-08-31: 4 mg via INTRAVENOUS

## 2022-08-31 SURGICAL SUPPLY — 36 items
BAG COUNTER SPONGE SURGICOUNT (BAG) IMPLANT
BAG SPNG CNTER NS LX DISP (BAG)
BLADE SURG 15 STRL LF DISP TIS (BLADE) ×1 IMPLANT
BLADE SURG 15 STRL SS (BLADE) ×1
BUR CROSS CUT FISSURE 1.6 (BURR) ×1 IMPLANT
BUR EGG ELITE 4.0 (BURR) ×1 IMPLANT
CANISTER SUCT 3000ML PPV (MISCELLANEOUS) ×1 IMPLANT
COVER SURGICAL LIGHT HANDLE (MISCELLANEOUS) ×1 IMPLANT
GAUZE PACKING FOLDED 2  STR (GAUZE/BANDAGES/DRESSINGS) ×1
GAUZE PACKING FOLDED 2 STR (GAUZE/BANDAGES/DRESSINGS) ×1 IMPLANT
GLOVE BIO SURGEON STRL SZ 6.5 (GLOVE) IMPLANT
GLOVE BIO SURGEON STRL SZ7 (GLOVE) IMPLANT
GLOVE BIO SURGEON STRL SZ8 (GLOVE) ×1 IMPLANT
GLOVE BIOGEL PI IND STRL 6.5 (GLOVE) IMPLANT
GLOVE BIOGEL PI IND STRL 7.0 (GLOVE) IMPLANT
GOWN STRL REUS W/ TWL LRG LVL3 (GOWN DISPOSABLE) ×1 IMPLANT
GOWN STRL REUS W/ TWL XL LVL3 (GOWN DISPOSABLE) ×1 IMPLANT
GOWN STRL REUS W/TWL LRG LVL3 (GOWN DISPOSABLE) ×1
GOWN STRL REUS W/TWL XL LVL3 (GOWN DISPOSABLE) ×1
IV NS 1000ML (IV SOLUTION) ×1
IV NS 1000ML BAXH (IV SOLUTION) ×1 IMPLANT
KIT BASIN OR (CUSTOM PROCEDURE TRAY) ×1 IMPLANT
KIT TURNOVER KIT B (KITS) ×1 IMPLANT
NDL HYPO 25GX1X1/2 BEV (NEEDLE) ×2 IMPLANT
NEEDLE HYPO 25GX1X1/2 BEV (NEEDLE) ×2 IMPLANT
NS IRRIG 1000ML POUR BTL (IV SOLUTION) ×1 IMPLANT
PAD ARMBOARD 7.5X6 YLW CONV (MISCELLANEOUS) ×1 IMPLANT
SLEEVE IRRIGATION ELITE 7 (MISCELLANEOUS) ×1 IMPLANT
SPIKE FLUID TRANSFER (MISCELLANEOUS) ×1 IMPLANT
SPONGE SURGIFOAM ABS GEL 12-7 (HEMOSTASIS) IMPLANT
SUT CHROMIC 3 0 PS 2 (SUTURE) ×1 IMPLANT
SYR BULB IRRIG 60ML STRL (SYRINGE) ×1 IMPLANT
SYR CONTROL 10ML LL (SYRINGE) ×1 IMPLANT
TRAY ENT MC OR (CUSTOM PROCEDURE TRAY) ×1 IMPLANT
TUBING IRRIGATION (MISCELLANEOUS) ×1 IMPLANT
YANKAUER SUCT BULB TIP NO VENT (SUCTIONS) ×1 IMPLANT

## 2022-08-31 NOTE — Op Note (Signed)
08/31/2022  9:26 AM  PATIENT:  George Valdez  67 y.o. male  PRE-OPERATIVE DIAGNOSIS:  NON RESTORABLE TEETH # 6, 7, 8, 9, 10, 11, 12, 13, 14, 15, 20, 21, 22, 23, 24, 25, 26, 27, 28, 29  POST-OPERATIVE DIAGNOSIS:  SAME  PROCEDURE:  Procedure(s): EXTRACTION TEETH # 6, 7, 8, 9, 10, 11, 12, 13, 14, 15, 20, 21, 22, 23, 24, 25, 26, 27, 28, 29  SURGEON:  Surgeon(s): Diona Browner, DMD  ANESTHESIA:   local and general  EBL:  minimal  DRAINS: none   SPECIMEN:  No Specimen  COUNTS:  YES  PLAN OF CARE: Discharge to home after PACU  PATIENT DISPOSITION:  PACU - hemodynamically stable.   PROCEDURE DETAILS: Dictation # [id number]  Gae Bon, DMD 08/31/2022 9:26 AM

## 2022-08-31 NOTE — Transfer of Care (Signed)
Immediate Anesthesia Transfer of Care Note  Patient: George Valdez  Procedure(s) Performed: DENTAL RESTORATION/EXTRACTIONS  Patient Location: PACU  Anesthesia Type:General  Level of Consciousness: awake, alert , oriented, patient cooperative, and responds to stimulation  Airway & Oxygen Therapy: Patient Spontanous Breathing and Patient connected to face mask oxygen  Post-op Assessment: Report given to RN, Post -op Vital signs reviewed and stable, and Patient moving all extremities X 4  Post vital signs: Reviewed and stable  Last Vitals:  Vitals Value Taken Time  BP 97/56 08/31/22 0937  Temp    Pulse 92 08/31/22 0939  Resp 22 08/31/22 0939  SpO2 100 % 08/31/22 0939  Vitals shown include unvalidated device data.  Last Pain:  Vitals:   08/31/22 0711  TempSrc: Oral  PainSc: 0-No pain         Complications: No notable events documented.

## 2022-08-31 NOTE — Anesthesia Procedure Notes (Signed)
Procedure Name: Intubation Date/Time: 08/31/2022 8:44 AM  Performed by: Michele Rockers, CRNAPre-anesthesia Checklist: Patient identified, Emergency Drugs available, Suction available and Patient being monitored Patient Re-evaluated:Patient Re-evaluated prior to induction Oxygen Delivery Method: Circle system utilized Preoxygenation: Pre-oxygenation with 100% oxygen Induction Type: IV induction Ventilation: Mask ventilation without difficulty Laryngoscope Size: Miller and 2 Grade View: Grade I Nasal Tubes: Nasal prep performed, Nasal Rae, Right and Magill forceps - small, utilized Tube size: 7.0 mm Number of attempts: 1 Placement Confirmation: ETT inserted through vocal cords under direct vision, positive ETCO2 and breath sounds checked- equal and bilateral Tube secured with: Tape Dental Injury: Teeth and Oropharynx as per pre-operative assessment

## 2022-08-31 NOTE — Op Note (Signed)
NAME: George Valdez, George Valdez MEDICAL RECORD NO: [id number] ACCOUNT NO: [id number] DATE OF BIRTH: 1956/03/19 FACILITY: [facility] LOCATION: [facility] PHYSICIAN: Gae Bon, DDS  Operative Report   DATE OF PROCEDURE: 08/31/2022   PREOPERATIVE DIAGNOSIS:  Nonrestorable teeth secondary to dental caries numbers, 6, 7, 8, 9, 10, 11, 12, 13, 14, 15, 20, 21, 22, 23, 24, 25, 26, 27, 28, 29.  POSTOPERATIVE DIAGNOSIS:  Nonrestorable teeth secondary to dental caries numbers, 6, 7, 8, 9, 10, 11, 12, 13, 14, 15, 20, 21, 22, 23, 24, 25, 26, 27, 28, 29.  PROCEDURE:  Extraction of teeth numbers per above.  Alveoloplasty left maxilla and mandible.  SURGEON:  Gae Bon, DDS  ANESTHESIA:  General, nasal intubation.  Dr. Constance Holster attending.  DESCRIPTION OF PROCEDURE:  The patient was taken to the operating room and placed on the table in supine position.  General anesthesia was administered and nasal endotracheal tube was placed and secured.  The eyes were protected and then the patient was  draped for surgery.  Timeout was performed.  The posterior pharynx was suctioned and a throat pack was placed.  2% lidocaine 1:100,000 epinephrine was infiltrated in an inferior alveolar block on the right and left sides of the mandible and in buccal  infiltration of the anterior mandible and then as buccal and palatal infiltration in the maxilla around the teeth to be removed.  The bite block was placed on the right side of the mouth, the left side was operated first.  A 15 blade was used to make an  incision 1 cm proximal to tooth #20 on the alveolar crest and was carried forward in the gingival sulcus both buccally and lingually until tooth #27 was encountered.  The periosteum was reflected.  The teeth were elevated.  Tooth #20 was removed with the  rongeurs as well as 23, 24, 25 and 26.  Teeth numbers 21 and 22 required removal of interproximal bone in order to gain purchase with the 301 elevator.  Then, the teeth were  elevated and removed with the rongeurs.  The sockets were then curetted and  debrided.  The periosteum was reflected to expose the alveolar crest, which was irregular  containing sharp edges and irregularities.  The alveoplasty was then performed using the egg bur followed by the bone file.  Then, the left mandible was closed  with 3-0 chromic after irrigating. The left maxilla was operated next.  A 15 blade was used to make an incision around the residual roots of teeth #15, 14, 13, 12, 11, 10, 9, 8 and 7.  The periosteum was reflected.  The teeth were elevated as best as  could be done with the 301 elevator and then removed with rongeurs and forceps.  The sockets were curetted and debrided with the rongeurs and then alveoloplasty was performed using the egg bur followed by the bone file.  Area was then irrigated and  closed with 3-0 chromic.  The bite block was repositioned to the other side of the mouth.  A 15 blade was used to make an incision around teeth numbers 27, 28, 29 in the mandible and around tooth number 6 in the maxilla.  The periosteum was reflected.   The teeth were elevated and removed with the dental forceps.  The sockets were curetted.  The alveolar contour was satisfactory and so no alveoloplasty was done on the right side.  The areas were irrigated and closed with 3-0 chromic.  The oral cavity  was then  irrigated and suctioned.  The throat pack was removed.  The patient was left under the care of anesthesia for extubation with plans for discharge home through day surgery.  ESTIMATED BLOOD LOSS:  Minimal.  COMPLICATIONS:  None.  SPECIMENS:  None.   Elián.[email or web address] D: 08/31/2022 9:30:51 am T: 08/31/2022 9:46:00 am  JOB: [id number]

## 2022-08-31 NOTE — Anesthesia Postprocedure Evaluation (Signed)
Anesthesia Post Note  Patient: George Valdez  Procedure(s) Performed: DENTAL RESTORATION/EXTRACTIONS     Patient location during evaluation: PACU Anesthesia Type: General Level of consciousness: awake Pain management: pain level controlled Vital Signs Assessment: post-procedure vital signs reviewed and stable Respiratory status: spontaneous breathing, nonlabored ventilation and respiratory function stable Cardiovascular status: blood pressure returned to baseline and stable Postop Assessment: no apparent nausea or vomiting Anesthetic complications: no   No notable events documented.  Last Vitals:  Vitals:   08/31/22 0952 08/31/22 1007  BP: 97/76 94/74  Pulse: 82 78  Resp: 14 12  Temp:  36.7 C  SpO2: 94% 94%    Last Pain:  Vitals:   08/31/22 0937  TempSrc:   PainSc: 0-No pain                 Nilda Simmer

## 2022-08-31 NOTE — H&P (Signed)
H&P documentation  -History and Physical Reviewed  -Patient has been re-examined  -No change in the plan of care  George Valdez  

## 2022-09-01 ENCOUNTER — Encounter (HOSPITAL_COMMUNITY): Payer: Self-pay | Admitting: Oral Surgery

## 2023-01-06 ENCOUNTER — Ambulatory Visit: Payer: Medicare Other | Attending: Internal Medicine | Admitting: Internal Medicine

## 2023-01-06 ENCOUNTER — Encounter: Payer: Self-pay | Admitting: Internal Medicine

## 2023-01-06 VITALS — BP 144/74 | HR 65 | Ht 63.0 in | Wt 146.0 lb

## 2023-01-06 DIAGNOSIS — Z720 Tobacco use: Secondary | ICD-10-CM | POA: Insufficient documentation

## 2023-01-06 DIAGNOSIS — I739 Peripheral vascular disease, unspecified: Secondary | ICD-10-CM | POA: Insufficient documentation

## 2023-01-06 DIAGNOSIS — I2583 Coronary atherosclerosis due to lipid rich plaque: Secondary | ICD-10-CM | POA: Insufficient documentation

## 2023-01-06 DIAGNOSIS — I159 Secondary hypertension, unspecified: Secondary | ICD-10-CM | POA: Insufficient documentation

## 2023-01-06 DIAGNOSIS — I340 Nonrheumatic mitral (valve) insufficiency: Secondary | ICD-10-CM | POA: Insufficient documentation

## 2023-01-06 DIAGNOSIS — I2510 Atherosclerotic heart disease of native coronary artery without angina pectoris: Secondary | ICD-10-CM | POA: Insufficient documentation

## 2023-01-06 MED ORDER — AMLODIPINE BESYLATE 5 MG PO TABS: 5.0000 mg | ORAL_TABLET | Freq: Every day | ORAL | 2 refills | Status: AC

## 2023-01-06 NOTE — Patient Instructions (Signed)
Medication Instructions:  Start taking Amlodipine 5 mg once daily Stop taking Metoprolol  Continue taking all other medications as prescribed  Labwork: Lipids at [facility] today Non-fasting  Testing/Procedures: Your physician has requested that you have a lower or upper extremity arterial duplex. This test is an ultrasound of the arteries in the legs or arms. It looks at arterial blood flow in the legs and arms. Allow one hour for Lower and Upper Arterial scans. There are no restrictions or special instructions   Follow-Up: Your physician recommends that you schedule a follow-up appointment in: 1 year. You will receive a reminder call in the mail in about 8 months reminding you to call and schedule your appointment. If you don't receive this call, please contact our office.  Any Other Special Instructions Will Be Listed Below (If Applicable).  If you need a refill on your cardiac medications before your next appointment, please call your pharmacy.

## 2023-01-06 NOTE — Addendum Note (Signed)
Addended by: Carmelina Paddock on: 01/06/2023 09:28 AM   Modules accepted: Orders

## 2023-01-06 NOTE — Progress Notes (Signed)
Cardiology Office Note  Date: 01/06/2023   ID: George Valdez, DOB 1956/03/12, MRN [id number]  PCP:  Patient, No Pcp Per  Cardiologist:  Marjo Bicker, MD Electrophysiologist:  None   Reason for Office Visit: Follow-up of CAD   History of Present Illness: George Valdez is a 67 y.o. male known to have carotid artery stents, history of three strokes, lower extremity stents follows up with vascular surgery, CAD (45% stenosis in LCx per prior documentation) presented to cardiology clinic for follow-up visit.   Patient is wheelchair-bound due to prior history of strokes.  However, he does get up and walk multiple times throughout the day with a walker but he was told recently that he had a hip fracture and hence not able to walk.  No symptoms of angina or DOE.  No orthopnea, PND.  He lives in a nursing facility, blood pressure in the nursing facility is ranging between 140 and 150 mmHg SBP.  Denied any leg swelling, palpitations.  Current smoker, smokes 10 to 15 cigarettes/day, denied alcohol use and illicit drug abuse.  Past Medical History:  Diagnosis Date   Alcohol abuse    Anxiety    Asthma    BPH (benign prostatic hyperplasia)    Chronic pain    Constipation    COPD (chronic obstructive pulmonary disease) (HCC)    Coronary artery disease    w/ stents   Depression    Diabetes mellitus without complication (HCC)    type 2 no meds, diet controlled   Frequent falls    Hyperlipemia    Hypertension    Myocardial infarction (HCC)    Neuropathy    Pernicious anemia    Stroke (HCC)    hx 3 strokes, left sided weakness   Uses walker    a couple times a day to move around   Wheelchair dependent     Past Surgical History:  Procedure Laterality Date   BIOPSY  09/02/2021   Procedure: BIOPSY;  Surgeon: Lanelle Bal, DO;  [facility];  Service: Endoscopy;;   CARDIAC CATHETERIZATION     CAROTID ENDARTERECTOMY     COLONOSCOPY WITH PROPOFOL N/A  09/02/2021   Procedure: COLONOSCOPY WITH PROPOFOL;  Surgeon: Lanelle Bal, DO;  [facility];  Service: Endoscopy;  Laterality: N/A;  11:45am   ESOPHAGOGASTRODUODENOSCOPY (EGD) WITH PROPOFOL N/A 09/02/2021   Procedure: ESOPHAGOGASTRODUODENOSCOPY (EGD) WITH PROPOFOL;  Surgeon: Lanelle Bal, DO;  [facility];  Service: Endoscopy;  Laterality: N/A;   POLYPECTOMY  09/02/2021   Procedure: POLYPECTOMY;  Surgeon: Lanelle Bal, DO;  [facility];  Service: Endoscopy;;   TOOTH EXTRACTION N/A 08/31/2022   Procedure: DENTAL RESTORATION/EXTRACTIONS;  Surgeon: Ocie Doyne, DMD;  [facility]: [facility];  Service: Oral Surgery;  Laterality: N/A;    Current Outpatient Medications  Medication Sig Dispense Refill   acetaminophen (TYLENOL) 325 MG tablet Take 650 mg by mouth every 4 (four) hours as needed (pain.).     amoxicillin (AMOXIL) 500 MG capsule Take 1 capsule (500 mg total) by mouth 3 (three) times daily. 21 capsule 0   aspirin EC 81 MG tablet Take 81 mg by mouth daily.     benzocaine (ORAJEL) 10 % mucosal gel Use as directed 1 Application in the mouth or throat every 8 (eight) hours as needed for mouth pain.     Benzocaine-Menthol-Zinc Cl (ORAJEL 3X TOOTHACHE & GUM) 20-0.26-0.15 % GEL Use as directed 1 strip in the  mouth or throat every 8 (eight) hours as needed (Tooth pain).     clopidogrel (PLAVIX) 75 MG tablet Take 1 tablet (75 mg total) by mouth daily. (Patient taking differently: Take 75 mg by mouth at bedtime.)     cyanocobalamin (,VITAMIN B-12,) 1000 MCG/ML injection Inject 1,000 mcg into the muscle every 30 (thirty) days.     DULoxetine (CYMBALTA) 60 MG capsule Take 60 mg by mouth daily.     ezetimibe (ZETIA) 10 MG tablet Take 1 tablet (10 mg total) by mouth daily. (Patient taking differently: Take 10 mg by mouth at bedtime.) 90 tablet 3   HYDROcodone-acetaminophen (NORCO) 5-325 MG tablet Take 1 tablet by mouth every 4 (four) hours as needed for moderate pain.  20 tablet 0   melatonin 5 MG TABS Take 5 mg by mouth at bedtime.     metoprolol succinate (TOPROL-XL) 25 MG 24 hr tablet Take 0.5 tablets (12.5 mg total) by mouth 2 (two) times daily. Take with or immediately following a meal.     Multiple Vitamin (MULTIVITAMIN WITH MINERALS) TABS tablet Take 1 tablet by mouth daily.     Nutritional Supplements (NUTRITIONAL SHAKE PLUS PO) Take 237 mLs by mouth in the morning, at noon, and at bedtime. Might Shakes (0900, 1200 & 1700)     pantoprazole (PROTONIX) 40 MG tablet Take 1 tablet (40 mg total) by mouth daily. 30 tablet 3   potassium chloride 20 MEQ TBCR Take 20 mEq by mouth daily. 30 tablet 0   rosuvastatin (CRESTOR) 40 MG tablet Take 40 mg by mouth every evening. (2100)     thiamine 100 MG tablet Take 1 tablet (100 mg total) by mouth daily. 30 tablet 0   traZODone (DESYREL) 50 MG tablet Take 50 mg by mouth at bedtime.     umeclidinium bromide (INCRUSE ELLIPTA) 62.5 MCG/ACT AEPB Inhale 1 puff into the lungs daily.     vitamin E 400 UNIT capsule Take 400 Units by mouth in the morning.     No current facility-administered medications for this visit.   Allergies:  Codeine   Social History: The patient  reports that he has been smoking cigarettes. He has a 10 pack-year smoking history. He has never used smokeless tobacco. He reports that he does not currently use alcohol after a past usage of about 12.0 standard drinks of alcohol per week. He reports that he does not use drugs.   Family History: The patient's family history includes Congestive Heart Failure in his sister; Congestive Heart Failure (age of onset: 77) in his mother; Heart attack (age of onset: 35) in his father.   ROS:  Please see the history of present illness. Otherwise, complete review of systems is positive for none.  All other systems are reviewed and negative.   Physical Exam: VS:  Ht 5\' 3"  (1.6 m)   Wt 146 lb (66.2 kg)   SpO2 95%   BMI 25.86 kg/m , BMI Body mass index is 25.86  kg/m.  Wt Readings from Last 3 Encounters:  01/06/23 146 lb (66.2 kg)  08/31/22 144 lb (65.3 kg)  07/05/22 142 lb 3.2 oz (64.5 kg)    General: Patient appears comfortable at rest. HEENT: Conjunctiva and lids normal, oropharynx clear with moist mucosa. Neck: Supple, no elevated JVP or carotid bruits, no thyromegaly. Lungs: Clear to auscultation, nonlabored breathing at rest. Cardiac: Regular rate and rhythm, no S3 or significant systolic murmur, no pericardial rub. Abdomen: Soft, nontender, no hepatomegaly, bowel sounds present, no guarding  or rebound. Extremities: No pitting edema, 1+ right radial pulse and 2+ left radial pulse Skin: Warm and dry. Musculoskeletal: No kyphosis. Neuropsychiatric: Alert and oriented x3  ECG:    Recent Labwork: 08/31/2022: ALT 20; AST 24; BUN 16; Creatinine, Ser 1.64; Hemoglobin 14.2; Platelets 232; Potassium 3.8; Sodium 138  No results found for: "CHOL", "TRIG", "HDL", "CHOLHDL", "VLDL", "LDLCALC", "LDLDIRECT"   Assessment and Plan: Patient is a 67 year old M known to have carotid artery stents, PAD s/p PVI (follows up with vascular surgery), CAD (45% LCx stenosis per prior documentation) presents to the cardiology clinic for follow-up visit.  # PAD of the right upper extremity -Patient has 1+ right radial pulse and 2+ left radial pulse.  Unable to obtain blood pressure on the right upper extremity.  No loss of sensation in the right upper extremity.  No weakness.  Will obtain ultrasound arterial Doppler of the right upper extremity.  # Carotid artery stents # Peripheral vascular stents in the left legs # Multiple strokes currently wheelchair-bound but walks with a walker at home # CAD (45% LCx disease in the past) -Continue DAPT, aspirin 81 mg once daily and Plavix 75 mg once daily. -Continue rosuvastatin 40 mg nightly and Zetia 10 mg nightly.  # HLD, unknown values -Continue rosuvastatin 40 mg nightly and Zetia 10 mg nightly, goal LDL less than  55.  Order lipid panel.  # HTN, poorly controlled -Blood pressure in the nursing facility is ranging between 140 and 150 mmHg.  Switch metoprolol to amlodipine 5 mg once daily.  Goal BP less than 130/80 mmHg.  # Mild MR in 1/24 -Can obtain echo every 3 to 4 years for progression of MR  # Nicotine abuse -Currently smokes cigarettes, smoking cessation counseling provided. Smoking cessation instruction/counseling given:  counseled patient on the dangers of tobacco use, advised patient to stop smoking, and reviewed strategies to maximize success   I have spent a total of 30 minutes with patient reviewing chart , telemetry, EKGs, labs and examining patient as well as establishing an assessment and plan that was discussed with the patient.  > 50% of time was spent in direct patient care.    Medication Adjustments/Labs and Tests Ordered: Current medicines are reviewed at length with the patient today.  Concerns regarding medicines are outlined above.   Tests Ordered: Orders Placed This Encounter  Procedures   EKG 12-Lead   Orders Placed This Encounter  Procedures   Lipid panel   EKG 12-Lead   VAS [place] UPPER EXTREMITY ARTERIAL DUPLEX    Medication Changes: No orders of the defined types were placed in this encounter.    Disposition:  Follow up 1 year  Signed, George Clagett Verne Spurr, MD, 01/06/2023 8:41 AM    [facility] [address] [place], [place] [address]

## 2023-01-18 ENCOUNTER — Ambulatory Visit: Payer: Medicare Other | Attending: Internal Medicine

## 2023-01-18 DIAGNOSIS — I739 Peripheral vascular disease, unspecified: Secondary | ICD-10-CM | POA: Diagnosis not present

## 2023-01-19 ENCOUNTER — Telehealth: Payer: Self-pay | Admitting: Internal Medicine

## 2023-01-19 NOTE — Telephone Encounter (Signed)
Medical records team from [facility] is requesting for [place] to fax the patient's results from their vascular test once they are finalized. They gave the fax number of [phone]

## 2023-01-24 NOTE — Telephone Encounter (Signed)
Finalized vascular test routed to [facility]

## 2023-04-11 ENCOUNTER — Encounter: Payer: Self-pay | Admitting: Diagnostic Neuroimaging

## 2023-04-11 ENCOUNTER — Ambulatory Visit (INDEPENDENT_AMBULATORY_CARE_PROVIDER_SITE_OTHER): Payer: Medicare Other | Admitting: Diagnostic Neuroimaging

## 2023-04-11 ENCOUNTER — Telehealth: Payer: Self-pay | Admitting: Diagnostic Neuroimaging

## 2023-04-11 VITALS — BP 145/77 | HR 88 | Ht 63.0 in | Wt 147.0 lb

## 2023-04-11 DIAGNOSIS — M25551 Pain in right hip: Secondary | ICD-10-CM

## 2023-04-11 DIAGNOSIS — M79604 Pain in right leg: Secondary | ICD-10-CM

## 2023-04-11 NOTE — Telephone Encounter (Signed)
no auth required sent to [facility]. he is in a wheelchair. [phone]

## 2023-04-11 NOTE — Patient Instructions (Signed)
RIGHT LEG PAIN / RIGHT HIP PAIN / RIGHT LOW BACK PAIN (worsening in past 5-6 months) - check xray right hip and MRI lumbar spine - continue PT exercises and pain mgmt per PCP

## 2023-04-11 NOTE — Progress Notes (Signed)
[facility]  PATIENT: George Valdez DOB: [birth year]  REFERRING CLINICIAN: Charlynne Pander, MD HISTORY FROM: patient  REASON FOR VISIT: new consult   HISTORICAL  CHIEF COMPLAINT:  Chief Complaint  Patient presents with   New Patient (Initial Visit)    Patient in room #6 and alone. Patient states neuropathy in his legs and recently been having falls. Patient states when he standing up he feels the pain shoot up from his right leg to his hip.     HISTORY OF PRESENT ILLNESS:   67 year old male here for evaluation of numbness and tingling.  Symptoms started about 15 years ago with pins-and-needles in the feet and toes.  About 5 to 6 months ago had increased right leg pain rating from the right hip to right leg.  Also has right low back pain.  History of stroke with left-sided incoordination and weakness.  History of hypertension, diabetes, hyperlipidemia, myocardial infarction, stroke.   REVIEW OF SYSTEMS: Full 14 system review of systems performed and negative with exception of: as per HPI.  ALLERGIES: Allergies  Allergen Reactions   Codeine Itching    HOME MEDICATIONS: Outpatient Medications Prior to Visit  Medication Sig Dispense Refill   acetaminophen (TYLENOL) 325 MG tablet Take 650 mg by mouth every 4 (four) hours as needed (pain.).     amLODipine (NORVASC) 5 MG tablet Take 1 tablet (5 mg total) by mouth daily. 90 tablet 2   amoxicillin (AMOXIL) 500 MG capsule Take 1 capsule (500 mg total) by mouth 3 (three) times daily. 21 capsule 0   aspirin EC 81 MG tablet Take 81 mg by mouth daily.     benzocaine (ORAJEL) 10 % mucosal gel Use as directed 1 Application in the mouth or throat every 8 (eight) hours as needed for mouth pain.     Benzocaine-Menthol-Zinc Cl (ORAJEL 3X TOOTHACHE & GUM) 20-0.26-0.15 % GEL Use as directed 1 strip in the mouth or throat every 8 (eight) hours as needed (Tooth pain).     clopidogrel (PLAVIX) 75 MG tablet Take 1 tablet  (75 mg total) by mouth daily. (Patient taking differently: Take 75 mg by mouth at bedtime.)     cyanocobalamin (,VITAMIN B-12,) 1000 MCG/ML injection Inject 1,000 mcg into the muscle every 30 (thirty) days.     DULoxetine (CYMBALTA) 60 MG capsule Take 60 mg by mouth daily.     ezetimibe (ZETIA) 10 MG tablet Take 1 tablet (10 mg total) by mouth daily. (Patient taking differently: Take 10 mg by mouth at bedtime.) 90 tablet 3   HYDROcodone-acetaminophen (NORCO) 5-325 MG tablet Take 1 tablet by mouth every 4 (four) hours as needed for moderate pain. 20 tablet 0   loperamide (IMODIUM) 2 MG capsule Take 2 mg by mouth as needed for diarrhea or loose stools.     melatonin 5 MG TABS Take 5 mg by mouth at bedtime.     Multiple Vitamin (MULTIVITAMIN WITH MINERALS) TABS tablet Take 1 tablet by mouth daily.     Nutritional Supplements (NUTRITIONAL SHAKE PLUS PO) Take 237 mLs by mouth in the morning, at noon, and at bedtime. Might Shakes (0900, 1200 & 1700)     pantoprazole (PROTONIX) 40 MG tablet Take 1 tablet (40 mg total) by mouth daily. 30 tablet 3   potassium chloride 20 MEQ TBCR Take 20 mEq by mouth daily. 30 tablet 0   pregabalin (LYRICA) 50 MG capsule Take 50 mg by mouth 3 (three) times daily.     rosuvastatin (  CRESTOR) 40 MG tablet Take 40 mg by mouth every evening. (2100)     tiZANidine (ZANAFLEX) 2 MG tablet Take 2 mg by mouth at bedtime.     traZODone (DESYREL) 50 MG tablet Take 50 mg by mouth at bedtime.     umeclidinium bromide (INCRUSE ELLIPTA) 62.5 MCG/ACT AEPB Inhale 1 puff into the lungs daily.     vitamin E 400 UNIT capsule Take 400 Units by mouth in the morning.     thiamine 100 MG tablet Take 1 tablet (100 mg total) by mouth daily. (Patient not taking: Reported on 04/11/2023) 30 tablet 0   No facility-administered medications prior to visit.    PAST MEDICAL HISTORY: Past Medical History:  Diagnosis Date   Alcohol abuse    Anxiety    Asthma    BPH (benign prostatic hyperplasia)     Chronic pain    Constipation    COPD (chronic obstructive pulmonary disease) (HCC)    Coronary artery disease    w/ stents   Depression    Diabetes mellitus without complication (HCC)    type 2 no meds, diet controlled   Frequent falls    Hyperlipemia    Hypertension    Myocardial infarction (HCC)    Neuropathy    Pernicious anemia    Stroke (HCC)    hx 3 strokes, left sided weakness   Uses walker    a couple times a day to move around   Wheelchair dependent     PAST SURGICAL HISTORY: Past Surgical History:  Procedure Laterality Date   BIOPSY  09/02/2021   Procedure: BIOPSY;  Surgeon: Lanelle Bal, DO;  [facility];  Service: Endoscopy;;   CARDIAC CATHETERIZATION     CAROTID ENDARTERECTOMY     COLONOSCOPY WITH PROPOFOL N/A 09/02/2021   Procedure: COLONOSCOPY WITH PROPOFOL;  Surgeon: Lanelle Bal, DO;  [facility];  Service: Endoscopy;  Laterality: N/A;  11:45am   ESOPHAGOGASTRODUODENOSCOPY (EGD) WITH PROPOFOL N/A 09/02/2021   Procedure: ESOPHAGOGASTRODUODENOSCOPY (EGD) WITH PROPOFOL;  Surgeon: Lanelle Bal, DO;  [facility];  Service: Endoscopy;  Laterality: N/A;   POLYPECTOMY  09/02/2021   Procedure: POLYPECTOMY;  Surgeon: Lanelle Bal, DO;  [facility];  Service: Endoscopy;;   TOOTH EXTRACTION N/A 08/31/2022   Procedure: DENTAL RESTORATION/EXTRACTIONS;  Surgeon: Ocie Doyne, DMD;  Location[facility];  Service: Oral Surgery;  Laterality: N/A;    FAMILY HISTORY: Family History  Problem Relation Age of Onset   Congestive Heart Failure Mother 74   Heart attack Father 102   Congestive Heart Failure Sister    Colon cancer Neg Hx    Colon polyps Neg Hx     SOCIAL HISTORY: Social History   Socioeconomic History   Marital status: Married    Spouse name: Not on file   Number of children: Not on file   Years of education: Not on file   Highest education level: Not on file  Occupational History   Not on file   Tobacco Use   Smoking status: Every Day    Current packs/day: 0.25    Average packs/day: 0.3 packs/day for 40.0 years (10.0 ttl pk-yrs)    Types: Cigarettes   Smokeless tobacco: Never   Tobacco comments:    down to 5 cigarettes/day from 1.5 PPD  Vaping Use   Vaping status: Never Used  Substance and Sexual Activity   Alcohol use: Not Currently    Alcohol/week: 12.0 standard drinks  of alcohol    Types: 12 Standard drinks or equivalent per week   Drug use: No   Sexual activity: Not on file  Other Topics Concern   Not on file  Social History Narrative   Not on file   Social Determinants of Health   Financial Resource Strain: Not on file  Food Insecurity: Not on file  Transportation Needs: Not on file  Physical Activity: Not on file  Stress: Not on file  Social Connections: Not on file  Intimate Partner Violence: Not on file     PHYSICAL EXAM  GENERAL EXAM/CONSTITUTIONAL: Vitals:  Vitals:   04/11/23 1007  BP: (!) 145/77  Pulse: 88  Weight: 147 lb (66.7 kg)  Height: 5\' 3"  (1.6 m)   Body mass index is 26.04 kg/m. Wt Readings from Last 3 Encounters:  04/11/23 147 lb (66.7 kg)  01/06/23 146 lb (66.2 kg)  08/31/22 144 lb (65.3 kg)   Patient is in no distress; well developed, nourished and groomed; neck is supple  CARDIOVASCULAR: Examination of carotid arteries is normal; no carotid bruits Regular rate and rhythm, no murmurs Examination of peripheral vascular system by observation and palpation is normal  EYES: Ophthalmoscopic exam of optic discs and posterior segments is normal; no papilledema or hemorrhages No results found.  MUSCULOSKELETAL: Gait, strength, tone, movements noted in Neurologic exam below  NEUROLOGIC: MENTAL STATUS:      No data to display         awake, alert, oriented to person, place and time recent and remote memory intact normal attention and concentration language fluent, comprehension intact, naming intact fund of knowledge  appropriate MILD-MODERATE DYSARTHRIA  CRANIAL NERVE:  2nd - no papilledema on fundoscopic exam 2nd, 3rd, 4th, 6th - pupils equal and reactive to light, visual fields full to confrontation, extraocular muscles intact, no nystagmus 5th - facial sensation symmetric 7th - facial strength symmetric 8th - hearing intact 9th - palate elevates symmetrically, uvula midline 11th - shoulder shrug symmetric 12th - tongue protrusion midline  MOTOR:  normal bulk and tone, DIFFUSE 4+/5; SLIGHT WEAKNESS IN LUE AND LLE  SENSORY:  normal and symmetric to light touch, pinprick, temperature, vibration  COORDINATION:  finger-nose-finger, fine finger movements SLOW ON LEFT  REFLEXES:  deep tendon reflexes BRISK IN RUE; OTHERWISE 1+  GAIT/STATION:  IN WHEELCHAIR     DIAGNOSTIC DATA (LABS, IMAGING, TESTING) - I reviewed patient records, labs, notes, testing and imaging myself where available.  Lab Results  Component Value Date   WBC 11.5 (H) 08/31/2022   HGB 14.2 08/31/2022   HCT 43.5 08/31/2022   MCV 91.2 08/31/2022   PLT 232 08/31/2022      Component Value Date/Time   NA 138 08/31/2022 0736   K 3.8 08/31/2022 0736   CL 107 08/31/2022 0736   CO2 24 08/31/2022 0736   GLUCOSE 107 (H) 08/31/2022 0736   BUN 16 08/31/2022 0736   CREATININE 1.64 (H) 08/31/2022 0736   CALCIUM 9.2 08/31/2022 0736   PROT 6.5 08/31/2022 0736   ALBUMIN 2.9 (L) 08/31/2022 0736   AST 24 08/31/2022 0736   ALT 20 08/31/2022 0736   ALKPHOS 82 08/31/2022 0736   BILITOT 0.7 08/31/2022 0736   GFRNONAA 46 (L) 08/31/2022 0736   GFRAA >60 08/23/2016 0458   No results found for: "CHOL", "HDL", "LDLCALC", "LDLDIRECT", "TRIG", "CHOLHDL" Lab Results  Component Value Date   HGBA1C 5.9 (H) 11/30/2015   Lab Results  Component Value Date   VITAMINB12 689 07/10/2021  Lab Results  Component Value Date   TSH 0.369 08/22/2016    07/22/16 MRI brain [I reviewed images myself and agree with interpretation. -VRP]  1.  Punctate 4 mm focus of diffusion abnormality within the cortical gray matter of the left parietal lobe, suspicious for tiny acute/early subacute ischemic infarct. No associated hemorrhage. 2. No other acute intracranial process identified. 3. Large remote left cerebellar infarct, with additional small remote left frontal cortical infarct and remote lacunar infarcts within the left basal ganglia. 4. Mild to moderate chronic microvascular ischemic disease.    ASSESSMENT AND PLAN  67 y.o. year old male here with:   Dx:  1. Right hip pain   2. Right leg pain     PLAN:  RIGHT LEG PAIN / RIGHT HIP PAIN / RIGHT LOW BACK PAIN (worsening in past 5-6 months) - check xray right hip and MRI lumbar spine - continue PT exercises and pain mgmt per PCP  Orders Placed This Encounter  Procedures   DG HIP UNILAT W OR W/O PELVIS 2-3 VIEWS RIGHT   MR LUMBAR SPINE WO CONTRAST   No follow-ups on file.    Suanne Marker, MD 04/11/2023, 10:41 AM Certified in Neurology, Neurophysiology and Neuroimaging  [facility] [address] [place], [place] [address] [phone]

## 2023-04-26 ENCOUNTER — Encounter (HOSPITAL_COMMUNITY): Payer: Self-pay

## 2023-04-26 ENCOUNTER — Ambulatory Visit (HOSPITAL_COMMUNITY)
Admission: RE | Admit: 2023-04-26 | Discharge: 2023-04-26 | Disposition: A | Discharge status: Home / Self Care | Payer: Medicare Other | Source: Ambulatory Visit | Attending: Diagnostic Neuroimaging | Admitting: Diagnostic Neuroimaging

## 2023-04-26 DIAGNOSIS — M79604 Pain in right leg: Secondary | ICD-10-CM | POA: Insufficient documentation

## 2023-04-26 DIAGNOSIS — M25551 Pain in right hip: Secondary | ICD-10-CM | POA: Insufficient documentation

## 2023-05-31 ENCOUNTER — Other Ambulatory Visit (HOSPITAL_COMMUNITY): Payer: Self-pay | Admitting: Diagnostic Neuroimaging

## 2023-05-31 DIAGNOSIS — Z8781 Personal history of (healed) traumatic fracture: Secondary | ICD-10-CM

## 2023-06-01 ENCOUNTER — Telehealth: Payer: Self-pay | Admitting: *Deleted

## 2023-06-01 DIAGNOSIS — M48061 Spinal stenosis, lumbar region without neurogenic claudication: Secondary | ICD-10-CM

## 2023-06-01 DIAGNOSIS — S72001S Fracture of unspecified part of neck of right femur, sequela: Secondary | ICD-10-CM

## 2023-06-01 NOTE — Telephone Encounter (Signed)
Spoke to [employer] at  [facility] .The patient is currently a resident there . RN  states pt is aware of right hip fracture. Informed RN will send referral to orthopedic clinic per Dr Marjory Lies . RN states will make pt aware of plan of care moving forward.  Placed order for  orthopedic clinic this am

## 2023-06-01 NOTE — Telephone Encounter (Signed)
-----   Message from Suanne Marker sent at 05/31/2023  4:39 PM EST ----- Right hip fracture noted. Refer to orthopedic clinic. -VRP

## 2023-06-01 NOTE — Telephone Encounter (Signed)
-----   Message from Suanne Marker sent at 05/31/2023  4:40 PM EST ----- Moderate spinal stenosis at L3-4. Refer to ortho spine clinic (also had right hip fracture). -VRP

## 2023-06-01 NOTE — Telephone Encounter (Addendum)
Spoke to RN at rehab facility where is a resident.  gave  test results and made her aware of plan of care moving forward  RN thanked me for calling  Order placed this afternoon

## 2023-06-07 ENCOUNTER — Telehealth: Payer: Self-pay | Admitting: Diagnostic Neuroimaging

## 2023-06-07 NOTE — Telephone Encounter (Signed)
Received this meesage from [facility]   From: Mendel Corning Sent: 06/06/2023   8:40 AM EST To: Suanne Marker, MD   Thank you for trusting the care of your patient to our office. However we have made three unsuccessful attempts to contact the patient, to schedule an appointment. At this time we will close the referral, if an appointment is still needed please have the patient contact our office at the referral line [phone] and we will gladly reopen the referral and schedule the patient.  LVM to make cassandra aware that pt is currently living at [facility] and will have to call to set up appointment through facility. Did leave [facility] # if she had any questions she can call back

## 2023-06-07 NOTE — Telephone Encounter (Signed)
Spoke to Elonda Husky is aware to call [facility] to set up appointment

## 2023-06-07 NOTE — Telephone Encounter (Signed)
Noted  

## 2023-06-07 NOTE — Telephone Encounter (Signed)
Cassandra from [employer] has called for [place], [place]

## 2023-06-08 ENCOUNTER — Telehealth: Payer: Self-pay | Admitting: Diagnostic Neuroimaging

## 2023-06-08 NOTE — Telephone Encounter (Signed)
Referral for orthopedics fax to [facility]. Phone: [phone], Fax: [phone]

## 2023-06-16 ENCOUNTER — Encounter: Payer: Self-pay | Admitting: Orthopaedic Surgery

## 2023-06-16 ENCOUNTER — Ambulatory Visit (INDEPENDENT_AMBULATORY_CARE_PROVIDER_SITE_OTHER): Payer: Medicare Other | Admitting: Orthopaedic Surgery

## 2023-06-16 DIAGNOSIS — S72001K Fracture of unspecified part of neck of right femur, subsequent encounter for closed fracture with nonunion: Secondary | ICD-10-CM | POA: Diagnosis not present

## 2023-06-16 NOTE — Progress Notes (Signed)
Office Visit Note   Patient: George Valdez           Date of Birth: 1956-02-20           MRN: [id number] Visit Date: 06/16/2023              Requested by: Suanne Marker, MD [address] [place],  [place] [address] PCP: Patient, No Pcp Per   Assessment & Plan: Visit Diagnoses:  1. 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  Patient is unable to ambulate as a result.  I have recommended a total hip arthroplasty.  Will need CT scan of the hip for presurgical planning.  Follow-up after the CT scan to discuss surgical plan.  Total face to face encounter time was greater than 45 minutes and over half of this time was spent in counseling and/or coordination of care.   Follow-Up Instructions: No follow-ups on file.   Orders:  No orders of the defined types were placed in this encounter.  No orders of the defined types were placed in this encounter.     Procedures: No procedures performed   Clinical Data: No additional findings.   Subjective: Chief Complaint  Patient presents with   Right Hip - Pain    HPI Patient is a 67 year old gentleman who comes in for evaluation of chronic right hip pain from a nonunion of a femoral neck fracture.  He is a referral from Dr. Marjory Lies.  He is a permanent resident at [facility].  He has a history of stroke and alcohol abuse.  He was using a walker for ambulation prior to the fall.   Patient is a poor historian so I cannot exactly figure out how he suffered a hip fracture and that it was never treated surgically.  Review of Systems  Constitutional: Negative.   HENT: Negative.    Eyes: Negative.   Respiratory: Negative.    Cardiovascular: Negative.   Gastrointestinal: Negative.   Endocrine: Negative.   Genitourinary: Negative.   Skin: Negative.   Allergic/Immunologic:  Negative.   Neurological: Negative.   Hematological: Negative.   Psychiatric/Behavioral: Negative.    All other systems reviewed and are negative.    Objective: Vital Signs: There were no vitals taken for this visit.  Physical Exam Vitals and nursing note reviewed.  Constitutional:      Appearance: He is well-developed.  HENT:     Head: Normocephalic and atraumatic.  Eyes:     Pupils: Pupils are equal, round, and reactive to light.  Pulmonary:     Effort: Pulmonary effort is normal.  Abdominal:     Palpations: Abdomen is soft.  Musculoskeletal:        General: Normal range of motion.     Cervical back: Neck supple.  Skin:    General: Skin is warm.  Neurological:     Mental Status: He is alert and oriented to person, place, and time.  Psychiatric:        Behavior: Behavior normal.        Thought Content: Thought content normal.        Judgment: Judgment normal.     Ortho Exam Exam of the right hip shows significant pain with movement.  He has no pain at rest. Specialty Comments:  No specialty comments available.  Imaging: No results found.   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 coli UTI (urinary tract infection) 07/12/2021   Rhabdomyolysis 07/10/2021   Generalized weakness 07/10/2021   Failure to thrive in adult 07/10/2021   Physical deconditioning 07/10/2021   Transaminitis 07/10/2021   Hypomagnesemia 07/10/2021   Leukocytosis 07/10/2021   Dehydration 07/10/2021   Mixed hyperlipidemia 07/10/2021   History of stroke 07/10/2021   Altered mental status 08/22/2016    Aspiration pneumonia (HCC) 08/22/2016   UTI (urinary tract infection) 08/22/2016   Protein-calorie malnutrition, severe 07/30/2016   Falls 07/22/2016   Weakness of left side of body 07/22/2016   h/o CVA (cerebral vascular accident) (HCC) with left sided weakness 07/22/2016   Alcohol abuse 07/22/2016   Hypotension 07/22/2016   Frequent falls    Anemia 11/30/2015   Hypokalemia 11/30/2015   AKI (acute kidney injury) (HCC) 11/30/2015   Acute encephalopathy 09/10/2014   Alcohol withdrawal delirium (HCC) 09/10/2014   Acute kidney injury superimposed on CKD (HCC) 09/10/2014   Sinus bradycardia 09/10/2014   Essential hypertension 09/10/2014   CAD (coronary artery disease) 09/10/2014   PAD (peripheral artery disease) (HCC) 09/10/2014   Pernicious anemia 09/10/2014   Past Medical History:  Diagnosis Date   Alcohol abuse    Anxiety    Asthma    BPH (benign prostatic hyperplasia)    Chronic pain    Constipation    COPD (chronic obstructive pulmonary disease) (HCC)    Coronary artery disease    w/ stents   Depression    Diabetes mellitus without complication (HCC)    type 2 no meds, diet controlled   Frequent falls    Hyperlipemia    Hypertension    Myocardial infarction (HCC)    Neuropathy    Pernicious anemia    Stroke (HCC)    hx 3 strokes, left sided weakness   Uses walker    a couple times a day to move around   Wheelchair dependent     Family History  Problem Relation Age of Onset   Congestive Heart Failure Mother 63   Heart attack Father 84   Congestive Heart Failure Sister    Colon cancer Neg Hx    Colon polyps Neg Hx     Past Surgical History:  Procedure Laterality Date   BIOPSY  09/02/2021   Procedure: BIOPSY;  Surgeon: Lanelle Bal, DO;  [facility];  Service: Endoscopy;;   CARDIAC CATHETERIZATION     CAROTID ENDARTERECTOMY     COLONOSCOPY WITH PROPOFOL N/A 09/02/2021   Procedure: COLONOSCOPY WITH PROPOFOL;  Surgeon: Lanelle Bal, DO;   [facility];  Service: Endoscopy;  Laterality: N/A;  11:45am   ESOPHAGOGASTRODUODENOSCOPY (EGD) WITH PROPOFOL N/A 09/02/2021   Procedure: ESOPHAGOGASTRODUODENOSCOPY (EGD) WITH PROPOFOL;  Surgeon: Lanelle Bal, DO;  [facility];  Service: Endoscopy;  Laterality: N/A;   POLYPECTOMY  09/02/2021   Procedure: POLYPECTOMY;  Surgeon: Lanelle Bal, DO;  [facility];  Service: Endoscopy;;   TOOTH EXTRACTION N/A 08/31/2022   Procedure: DENTAL RESTORATION/EXTRACTIONS;  Surgeon: Ocie Doyne, DMD;  Location: [facility];  Service: Oral  Surgery;  Laterality: N/A;   Social History   Occupational History   Not on file  Tobacco Use   Smoking status: Every Day    Current packs/day: 0.25    Average packs/day: 0.3 packs/day for 40.0 years (10.0 ttl pk-yrs)    Types: Cigarettes   Smokeless tobacco: Never   Tobacco comments:    down to 5 cigarettes/day from 1.5 PPD  Vaping Use   Vaping status: Never Used  Substance and Sexual Activity   Alcohol use: Not Currently    Alcohol/week: 12.0 standard drinks of alcohol    Types: 12 Standard drinks or equivalent per week   Drug use: No   Sexual activity: Not on file

## 2023-06-19 ENCOUNTER — Telehealth: Payer: Self-pay | Admitting: Radiology

## 2023-06-19 NOTE — Telephone Encounter (Signed)
FYI  Received call from [facility] where patient is a resident. They were asking about weightbearing status as patient transfers by himself. Advised non weightbearing on right lower leg pending CT scan and possible surgery scheduling. Victorino Dike expressed understanding and was going to pass along to PT.

## 2023-06-29 ENCOUNTER — Other Ambulatory Visit: Payer: Medicare Other

## 2023-07-03 ENCOUNTER — Other Ambulatory Visit: Payer: Self-pay | Admitting: *Deleted

## 2023-07-03 ENCOUNTER — Ambulatory Visit
Admission: RE | Admit: 2023-07-03 | Discharge: 2023-07-03 | Disposition: A | Discharge status: Home / Self Care | Payer: Medicaid Other | Source: Ambulatory Visit | Attending: Orthopaedic Surgery | Admitting: Orthopaedic Surgery

## 2023-07-03 DIAGNOSIS — S72001K Fracture of unspecified part of neck of right femur, subsequent encounter for closed fracture with nonunion: Secondary | ICD-10-CM

## 2023-07-03 DIAGNOSIS — S72001S Fracture of unspecified part of neck of right femur, sequela: Secondary | ICD-10-CM

## 2023-07-05 ENCOUNTER — Telehealth: Payer: Self-pay | Admitting: Diagnostic Neuroimaging

## 2023-07-05 NOTE — Telephone Encounter (Signed)
 Referral for Orthopedics has been refaxed to requested facility. [facility]. Phone: [phone], Fax: [phone]

## 2023-07-05 NOTE — Telephone Encounter (Signed)
Referral for orthopedics fax to [facility]. Phone: [phone], Fax: [phone]

## 2023-07-10 NOTE — Progress Notes (Signed)
 Needs appt

## 2023-07-10 NOTE — Progress Notes (Signed)
 [facility] rehab and scheduled follow up.

## 2023-07-14 ENCOUNTER — Ambulatory Visit (INDEPENDENT_AMBULATORY_CARE_PROVIDER_SITE_OTHER): Payer: Medicare Other | Admitting: Orthopaedic Surgery

## 2023-07-14 ENCOUNTER — Encounter: Payer: Self-pay | Admitting: Orthopaedic Surgery

## 2023-07-14 DIAGNOSIS — S72001K Fracture of unspecified part of neck of right femur, subsequent encounter for closed fracture with nonunion: Secondary | ICD-10-CM | POA: Diagnosis not present

## 2023-07-14 NOTE — Progress Notes (Signed)
Office Visit Note   Patient: George Valdez           Date of Birth: [birth year]           MRN: [id number] Visit Date: 07/14/2023              Requested by: No referring provider defined for this encounter. PCP: Patient, No Pcp Per   Assessment & Plan: Visit Diagnoses:  1. 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  Does appear that there is enough calcar remaining to do a traditional total hip arthroplasty.  Details of surgery including risk benefits alternatives prognosis reviewed.  We will get necessary preoperative clearances prior to scheduling surgery.  Follow-Up Instructions: No follow-ups on file.   Orders:  No orders of the defined types were placed in this encounter.  No orders of the defined types were placed in this encounter.     Procedures: No procedures performed   Clinical Data: No additional findings.   Subjective: Chief Complaint  Patient presents with   Right Hip - Pain    HPI George Valdez returns today to go over recent right hip CT scan.  Denies any changes in symptoms. Review of Systems  Constitutional: Negative.   HENT: Negative.    Eyes: Negative.   Respiratory: Negative.    Cardiovascular: Negative.   Gastrointestinal: Negative.   Endocrine: Negative.   Genitourinary: Negative.   Skin: Negative.   Allergic/Immunologic: Negative.   Neurological: Negative.   Hematological: Negative.   Psychiatric/Behavioral: Negative.    All other systems reviewed and are negative.    Objective: Vital Signs: There were no vitals taken for this visit.  Physical Exam Vitals and nursing note reviewed.  Constitutional:      Appearance: He is well-developed.  Pulmonary:     Effort: Pulmonary effort is normal.  Abdominal:     Palpations: Abdomen is soft.  Skin:    General: Skin is warm.   Neurological:     Mental Status: He is alert and oriented to person, place, and time.  Psychiatric:        Behavior: Behavior normal.        Thought Content: Thought content normal.        Judgment: Judgment normal.     Ortho Exam Examination of right hip shows severe pain with any movement.  Leg length discrepancy. Specialty Comments:  No specialty comments available.  Imaging: No results found.   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 coli UTI (urinary tract infection) 07/12/2021   Rhabdomyolysis 07/10/2021   Generalized weakness 07/10/2021   Failure to thrive in adult 07/10/2021   Physical deconditioning 07/10/2021   Transaminitis 07/10/2021   Hypomagnesemia 07/10/2021   Leukocytosis 07/10/2021   Dehydration 07/10/2021   Mixed hyperlipidemia 07/10/2021   History of stroke 07/10/2021   Altered mental status 08/22/2016   Aspiration pneumonia (HCC) 08/22/2016   UTI (urinary tract infection) 08/22/2016   Protein-calorie  malnutrition, severe 07/30/2016   Falls 07/22/2016   Weakness of left side of body 07/22/2016   h/o CVA (cerebral vascular accident) [name]) with left sided weakness 07/22/2016   Alcohol abuse 07/22/2016   Hypotension 07/22/2016   Frequent falls    Anemia 11/30/2015   Hypokalemia 11/30/2015   AKI (acute kidney injury) (HCC) 11/30/2015   Acute encephalopathy 09/10/2014   Alcohol withdrawal delirium (HCC) 09/10/2014   Acute kidney injury superimposed on CKD (HCC) 09/10/2014   Sinus bradycardia 09/10/2014   Essential hypertension 09/10/2014   CAD (coronary artery  disease) 09/10/2014   PAD (peripheral artery disease) (HCC) 09/10/2014   Pernicious anemia 09/10/2014   Past Medical History:  Diagnosis Date   Alcohol abuse    Anxiety    Asthma    BPH (benign prostatic hyperplasia)    Chronic pain    Constipation    COPD (chronic obstructive pulmonary disease) (HCC)    Coronary artery disease    w/ stents   Depression    Diabetes mellitus without complication (HCC)    type 2 no meds, diet controlled   Frequent falls    Hyperlipemia    Hypertension    Myocardial infarction (HCC)    Neuropathy    Pernicious anemia    Stroke (HCC)    hx 3 strokes, left sided weakness   Uses walker    a couple times a day to move around   Wheelchair dependent     Family History  Problem Relation Age of Onset   Congestive Heart Failure Mother 58   Heart attack Father 21   Congestive Heart Failure Sister    Colon cancer Neg Hx    Colon polyps Neg Hx     Past Surgical History:  Procedure Laterality Date   BIOPSY  09/02/2021   Procedure: BIOPSY;  Surgeon: Lanelle Bal, DO;  [facility];  Service: Endoscopy;;   CARDIAC CATHETERIZATION     CAROTID ENDARTERECTOMY     COLONOSCOPY WITH PROPOFOL N/A 09/02/2021   Procedure: COLONOSCOPY WITH PROPOFOL;  Surgeon: Lanelle Bal, DO;  [facility];  Service: Endoscopy;  Laterality: N/A;  11:45am   ESOPHAGOGASTRODUODENOSCOPY (EGD) WITH PROPOFOL N/A 09/02/2021   Procedure: ESOPHAGOGASTRODUODENOSCOPY (EGD) WITH PROPOFOL;  Surgeon: Lanelle Bal, DO;  [facility];  Service: Endoscopy;  Laterality: N/A;   POLYPECTOMY  09/02/2021   Procedure: POLYPECTOMY;  Surgeon: Lanelle Bal, DO;  [facility];  Service: Endoscopy;;   TOOTH EXTRACTION N/A 08/31/2022   Procedure: DENTAL RESTORATION/EXTRACTIONS;  Surgeon: Ocie Doyne, DMD;  Location: [facility];  Service: Oral Surgery;  Laterality: N/A;   Social History   Occupational History   Not on file  Tobacco Use   Smoking  status: Every Day    Current packs/day: 0.25    Average packs/day: 0.3 packs/day for 40.0 years (10.0 ttl pk-yrs)    Types: Cigarettes   Smokeless tobacco: Never   Tobacco comments:    down to 5 cigarettes/day from 1.5 PPD  Vaping Use   Vaping status: Never Used  Substance and Sexual Activity   Alcohol use: Not Currently    Alcohol/week: 12.0 standard drinks of alcohol    Types: 12 Standard drinks or equivalent per week   Drug use: No   Sexual activity: Not on file

## 2023-09-08 ENCOUNTER — Telehealth: Payer: Self-pay

## 2023-09-08 ENCOUNTER — Other Ambulatory Visit: Payer: Self-pay | Admitting: Physician Assistant

## 2023-09-08 NOTE — Telephone Encounter (Signed)
 Looks like he is on asa and plavix?  Debbie, what does xu recommend for this?

## 2023-09-08 NOTE — Telephone Encounter (Signed)
 Received voice mail inquiring when patient needs to stop blood thinner for surgery on 09/18/23.[employer] [phone]

## 2023-09-12 ENCOUNTER — Other Ambulatory Visit: Payer: Self-pay | Admitting: Physician Assistant

## 2023-09-12 MED ORDER — METHOCARBAMOL 750 MG PO TABS
750.0000 mg | ORAL_TABLET | Freq: Three times a day (TID) | ORAL | 1 refills | Status: AC | PRN
Start: 2023-09-12 — End: ?

## 2023-09-12 MED ORDER — DOCUSATE SODIUM 100 MG PO CAPS
100.0000 mg | ORAL_CAPSULE | Freq: Every day | ORAL | 2 refills | Status: AC | PRN
Start: 2023-09-12 — End: 2024-09-11

## 2023-09-12 MED ORDER — DOXYCYCLINE HYCLATE 100 MG PO TABS: 100.0000 mg | ORAL_TABLET | Freq: Two times a day (BID) | ORAL | 0 refills | Status: DC

## 2023-09-12 MED ORDER — OXYCODONE-ACETAMINOPHEN 5-325 MG PO TABS: 1.0000 | ORAL_TABLET | Freq: Four times a day (QID) | ORAL | 0 refills | Status: DC | PRN

## 2023-09-12 MED ORDER — ONDANSETRON HCL 4 MG PO TABS: 4.0000 mg | ORAL_TABLET | Freq: Three times a day (TID) | ORAL | 0 refills | Status: AC | PRN

## 2023-09-14 ENCOUNTER — Other Ambulatory Visit: Payer: Self-pay

## 2023-09-14 NOTE — Progress Notes (Signed)
 Spoke with George Valdez at [facility] to review instructions and answer questions.  Per Victorino Dike, the patient's last dose of Plavix was 3/18 and she was given those instructions by Eunice Blase at Dr. Warren Danes office.     PCP - Dr. Charlynne Pander Cardiologist - Dr. Jenene Slicker  PPM/ICD -  Device Orders -  Rep Notified -   Chest x-ray -  EKG - 01/06/23 Stress Test - 07/12/22 ECHO - 07/18/22 Cardiac Cath -   Sleep Study -  CPAP -     Last dose of GLP1 agonist-  n/a GLP1 instructions: n/a  Aspirin Instructions:n/a  ERAS Protcol - clears until 0700 PRE-SURGERY Ensure or G2- n/a  COVID TEST- n/a   Anesthesia review: yes  Instructions faxed to [place] at [facility].

## 2023-09-14 NOTE — Progress Notes (Signed)
 Surgical Instructions   Your procedure is scheduled on Monday, March 24th, 2025. Report to [facility] Main Entrance "A" at 7:30 A.M., then check in with the Admitting office. Any questions or running late day of surgery: call [phone]  Questions prior to your surgery date: call [phone], Monday-Friday, 8am-4pm. If you experience any cold or flu symptoms such as cough, fever, chills, shortness of breath, etc. between now and your scheduled surgery, please notify us at the above number.     Remember:  Do not eat after midnight the night before your surgery.  This includes gum and candy.   You may drink clear liquids until 7:00 the morning of your surgery.   Clear liquids allowed are: Water, Non-Citrus Juices (without pulp), Carbonated Beverages, Clear Tea (no milk, honey, etc.), Black Coffee Only (NO MILK, CREAM OR POWDERED CREAMER of any kind), and Gatorade.    Take these medicines the morning of surgery with A SIP OF WATER: Amlodipine (Norvasc) Duloxetine (Cymbalta) Pantoprazole (Protonix) Pregabalin (Lyrica) Tiotropium (Spiriva) inhaler   May take these medicines IF NEEDED: Acetaminophen (Tylenol) Diphenhydramine (Benadryl) Hydrocodone-acetaminophen (Norco)   Follow your surgeon's instructions on when to stop your Plavix.     One week prior to surgery, STOP taking any Aspirin (unless otherwise instructed by your surgeon) Aleve, Naproxen, Ibuprofen, Motrin, Advil, Goody's, BC's, all herbal medications, fish oil, and non-prescription vitamins.                     Do NOT Smoke (Tobacco/Vaping) for 24 hours prior to your procedure.  If you use a CPAP at night, you may bring your mask/headgear for your overnight stay.   You will be asked to remove any contacts, glasses, piercing's, hearing aid's, dentures/partials prior to surgery. Please bring cases for these items if needed.    Patients discharged the day of surgery will not be allowed to drive home, and someone  needs to stay with them for 24 hours.  SURGICAL WAITING ROOM VISITATION Patients may have no more than 2 support people in the waiting area - these visitors may rotate.   Pre-op nurse will coordinate an appropriate time for 1 ADULT support person, who may not rotate, to accompany patient in pre-op.  Children under the age of 59 must have an adult with them who is not the patient and must remain in the main waiting area with an adult.  If the patient needs to stay at the hospital during part of their recovery, the visitor guidelines for inpatient rooms apply.  Please refer to the [facility] website for the visitor guidelines for any additional information.     Additional instructions for the day of surgery: DO NOT APPLY any lotions, deodorants, cologne, or perfumes.   Do not wear jewelry or makeup Do not wear nail polish, gel polish, artificial nails, or any other type of covering on natural nails (fingers and toes) Do not bring valuables to the hospital. [facility] is not responsible for valuables/personal belongings. Put on clean/comfortable clothes.  Please brush your teeth.  Ask your nurse before applying any prescription medications to the skin.

## 2023-09-14 NOTE — Anesthesia Preprocedure Evaluation (Addendum)
 Anesthesia Evaluation  Patient identified by MRN, date of birth, ID band Patient awake    Reviewed: Allergy & Precautions, NPO status , Patient's Chart, lab work & pertinent test results  Airway Mallampati: I  TM Distance: >3 FB Neck ROM: Full    Dental  (+) Edentulous Upper, Edentulous Lower, Dental Advisory Given   Pulmonary asthma , COPD,  COPD inhaler, Current Smoker and Patient abstained from smoking.   Pulmonary exam normal breath sounds clear to auscultation       Cardiovascular hypertension, Pt. on medications + CAD, + Past MI, + Cardiac Stents and + Peripheral Vascular Disease  Normal cardiovascular exam Rhythm:Regular Rate:Normal  Echo 07/18/22: IMPRESSIONS   1. Left ventricular ejection fraction, by estimation, is 55 to 60%. The  left ventricle has normal function. Left ventricular endocardial border  not optimally defined to evaluate regional wall motion. Left ventricular  diastolic parameters are consistent  with Grade I diastolic dysfunction (impaired relaxation). The average left  ventricular global longitudinal strain is -22.3 %. The global longitudinal  strain is normal.   2. Right ventricular systolic function is low normal. The right  ventricular size is normal.   3. The mitral valve is abnormal. There are two jets of mitral valve  regurgitation, mild and trivial. No evidence of mitral stenosis. Mild to  moderate mitral annular calcification.   4. The aortic valve was not well visualized. There is mild calcification  of the aortic valve. Aortic valve regurgitation is not visualized. No  aortic stenosis is present.   5. The inferior vena cava is normal in size with greater than 50%  respiratory variability, suggesting right atrial pressure of 3 mmHg.  - Comparison(s): No significant change from prior study.    Nuclear stress test 07/12/22:   Findings are consistent with prior inferior infarction mild with  peri-infarct ischemia. The study is low risk.   No ST deviation was noted.   LV perfusion is abnormal. Large moderate intensity inferior defect with mild reversibility.   Left ventricular function is normal. Nuclear stress EF: 57 %. The left ventricular ejection fraction is normal (55-65%). End diastolic cavity size is normal.    Neuro/Psych  PSYCHIATRIC DISORDERS Anxiety Depression    CVA (left sided weakness), Residual Symptoms    GI/Hepatic negative GI ROS,,,(+)     substance abuse  alcohol use  Endo/Other  diabetes    Renal/GU negative Renal ROS  negative genitourinary   Musculoskeletal negative musculoskeletal ROS (+)  narcotic dependent  Abdominal   Peds  Hematology  (+) Blood dyscrasia (plavix, LD 09/11/23)   Anesthesia Other Findings 68 year old male with pertinent history including smoking, COPD, CAD/MI, HTN, HLD, pernicous anemia, alcohol abuse (sober for ~ 5 years), CVA (left hemiparesis), carotid artery stenosis (s/p bilateral carotid endarterectomies), PAD, BPH, neuropathy, chronic pain, frequent falls, anxiety.  Plavix  Reproductive/Obstetrics                             Anesthesia Physical Anesthesia Plan  ASA: 3  Anesthesia Plan:    Post-op Pain Management:    Induction:   PONV Risk Score and Plan: Ondansetron and Dexamethasone  Airway Management Planned:   Additional Equipment:   Intra-op Plan:   Post-operative Plan:   Informed Consent: I have reviewed the patients History and Physical, chart, labs and discussed the procedure including the risks, benefits and alternatives for the proposed anesthesia with the patient or authorized representative who has indicated  his/her understanding and acceptance.     Dental advisory given  Plan Discussed with: CRNA  Anesthesia Plan Comments:         Anesthesia Quick Evaluation

## 2023-09-14 NOTE — Progress Notes (Signed)
 Anesthesia Chart Review: Same-day workup  68 year old male with pertinent history including smoking, COPD, CAD/MI, HTN, HLD, pernicous anemia, alcohol abuse (sober for ~ 5 years), CVA (left hemiparesis), carotid artery stenosis (s/p bilateral carotid endarterectomies), PAD, BPH, neuropathy, chronic pain, frequent falls, anxiety.   Recent nuclear stress test 07/12/2022 test was consistent with prior inferior infarction mild with peri-infarct ischemia, EF 57% but was consider low risk. Echo on 07/18/22 showed normal LVEF, mild MR. Based on those results, he was cleared at moderate risk to have dental extractions under general anesthesia which he did subsequently have on 08/31/2022 without complication.  He was last seen in follow-up by cardiologist Dr. Jenene Slicker on 01/06/2023.   Blood pressure was noted to be suboptimally controlled, his metoprolol was switched to amlodipine 5 mg daily.  All other medications were continued.  He was also noted to have diminished right radial pulse and inability to obtain blood pressure on the RUE.  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  He was recommended to follow-up with vascular surgery, however it does not appear he has done so yet.  Clearance from Dr. Charlynne Pander dated 07/18/2023 states patient is moderate risk.  Copy scanned into media.  Pt will need DOS labs and eval.   EKG 01/06/2023: Normal sinus rhythm.  Rate 65. Left anterior fascicular block. Left ventricular hypertrophy. T wave inversion in inferior and lateral leads  Echo 07/18/22: IMPRESSIONS   1. Left ventricular ejection fraction, by estimation, is 55 to 60%. The  left ventricle has normal function. Left ventricular endocardial border  not optimally defined to evaluate regional wall motion. Left ventricular  diastolic parameters are consistent  with Grade I diastolic dysfunction (impaired relaxation). The average left  ventricular global longitudinal  strain is -22.3 %. The global longitudinal  strain is normal.   2. Right ventricular systolic function is low normal. The right  ventricular size is normal.   3. The mitral valve is abnormal. There are two jets of mitral valve  regurgitation, mild and trivial. No evidence of mitral stenosis. Mild to  moderate mitral annular calcification.   4. The aortic valve was not well visualized. There is mild calcification  of the aortic valve. Aortic valve regurgitation is not visualized. No  aortic stenosis is present.   5. The inferior vena cava is normal in size with greater than 50%  respiratory variability, suggesting right atrial pressure of 3 mmHg.  - Comparison(s): No significant change from prior study.    Nuclear stress test 07/12/22:   Findings are consistent with prior inferior infarction mild with peri-infarct ischemia. The study is low risk.   No ST deviation was noted.   LV perfusion is abnormal. Large moderate intensity inferior defect with mild reversibility.   Left ventricular function is normal. Nuclear stress EF: 57 %. The left ventricular ejection fraction is normal (55-65%). End diastolic cavity size is normal.    Zannie Cove [facility] Short Stay Center/Anesthesiology Phone [phone] 09/14/2023 11:03 AM

## 2023-09-15 MED ORDER — TRANEXAMIC ACID 1000 MG/10ML IV SOLN
2000.0000 mg | INTRAVENOUS | Status: DC
  Filled 2023-09-15: qty 20

## 2023-09-15 NOTE — Progress Notes (Signed)
 Daughter notified of time change. Daughter stated she will call father and have him speak with the nursing home staff to inform them of the new arrival time on 09/18/2023. Daughter stated she is unsure if facility would be able to [facility] on sort notice.

## 2023-09-18 ENCOUNTER — Observation Stay (HOSPITAL_COMMUNITY)
Admission: RE | Admit: 2023-09-18 | Discharge: 2023-09-19 | Disposition: A | Discharge status: Skilled Nursing Facility | Payer: Medicare Other | Attending: Orthopaedic Surgery | Admitting: Orthopaedic Surgery

## 2023-09-18 ENCOUNTER — Ambulatory Visit (HOSPITAL_COMMUNITY): Payer: Self-pay | Admitting: Physician Assistant

## 2023-09-18 ENCOUNTER — Encounter (HOSPITAL_COMMUNITY)
Admission: RE | Disposition: A | Discharge status: Skilled Nursing Facility | Payer: Self-pay | Source: Home / Self Care | Attending: Orthopaedic Surgery

## 2023-09-18 ENCOUNTER — Observation Stay (HOSPITAL_COMMUNITY)

## 2023-09-18 ENCOUNTER — Other Ambulatory Visit: Payer: Self-pay

## 2023-09-18 ENCOUNTER — Encounter (HOSPITAL_COMMUNITY): Payer: Self-pay | Admitting: Orthopaedic Surgery

## 2023-09-18 DIAGNOSIS — J449 Chronic obstructive pulmonary disease, unspecified: Secondary | ICD-10-CM | POA: Insufficient documentation

## 2023-09-18 DIAGNOSIS — X58XXXA Exposure to other specified factors, initial encounter: Secondary | ICD-10-CM | POA: Diagnosis not present

## 2023-09-18 DIAGNOSIS — Z79899 Other long term (current) drug therapy: Secondary | ICD-10-CM | POA: Insufficient documentation

## 2023-09-18 DIAGNOSIS — S72041K Displaced fracture of base of neck of right femur, subsequent encounter for closed fracture with nonunion: Secondary | ICD-10-CM

## 2023-09-18 DIAGNOSIS — F17210 Nicotine dependence, cigarettes, uncomplicated: Secondary | ICD-10-CM | POA: Insufficient documentation

## 2023-09-18 DIAGNOSIS — E119 Type 2 diabetes mellitus without complications: Secondary | ICD-10-CM | POA: Diagnosis not present

## 2023-09-18 DIAGNOSIS — S72001K Fracture of unspecified part of neck of right femur, subsequent encounter for closed fracture with nonunion: Secondary | ICD-10-CM | POA: Diagnosis not present

## 2023-09-18 DIAGNOSIS — I10 Essential (primary) hypertension: Secondary | ICD-10-CM | POA: Diagnosis not present

## 2023-09-18 DIAGNOSIS — Z86011 Personal history of benign neoplasm of the brain: Secondary | ICD-10-CM | POA: Insufficient documentation

## 2023-09-18 DIAGNOSIS — S72001A Fracture of unspecified part of neck of right femur, initial encounter for closed fracture: Secondary | ICD-10-CM | POA: Diagnosis present

## 2023-09-18 DIAGNOSIS — I2510 Atherosclerotic heart disease of native coronary artery without angina pectoris: Secondary | ICD-10-CM | POA: Diagnosis not present

## 2023-09-18 DIAGNOSIS — Z96641 Presence of right artificial hip joint: Secondary | ICD-10-CM

## 2023-09-18 HISTORY — PX: TOTAL HIP ARTHROPLASTY: SHX124

## 2023-09-18 LAB — BASIC METABOLIC PANEL
Anion gap: 9 (ref 5–15)
BUN: 12 mg/dL (ref 8–23)
CO2: 21 mmol/L — ABNORMAL LOW (ref 22–32)
Calcium: 9.1 mg/dL (ref 8.9–10.3)
Chloride: 108 mmol/L (ref 98–111)
Creatinine, Ser: 1.25 mg/dL — ABNORMAL HIGH (ref 0.61–1.24)
GFR, Estimated: >60 mL/min (ref >60)
Glucose, Bld: 94 mg/dL (ref 70–99)
Potassium: 4.5 mmol/L (ref 3.5–5.1)
Sodium: 138 mmol/L (ref 135–145)

## 2023-09-18 LAB — SURGICAL PCR SCREEN
MRSA, PCR: NEGATIVE
Staphylococcus aureus: POSITIVE — AB

## 2023-09-18 LAB — CBC
HCT: 47.6 % (ref 39.0–52.0)
Hemoglobin: 15.1 g/dL (ref 13.0–17.0)
MCH: 28.9 pg (ref 26.0–34.0)
MCHC: 31.7 g/dL (ref 30.0–36.0)
MCV: 91 fL (ref 80.0–100.0)
Platelets: 222 10*3/uL (ref 150–400)
RBC: 5.23 MIL/uL (ref 4.22–5.81)
RDW: 18.2 % — ABNORMAL HIGH (ref 11.5–15.5)
WBC: 7 10*3/uL (ref 4.0–10.5)
nRBC: 0 % (ref 0.0–0.2)

## 2023-09-18 LAB — TYPE AND SCREEN
ABO/RH(D): A POS
Antibody Screen: NEGATIVE

## 2023-09-18 LAB — GLUCOSE, CAPILLARY
Glucose-Capillary: 102 mg/dL — ABNORMAL HIGH (ref 70–99)
Glucose-Capillary: 125 mg/dL — ABNORMAL HIGH (ref 70–99)
Glucose-Capillary: 102 mg/dL — ABNORMAL HIGH (ref 70–99)

## 2023-09-18 SURGERY — ARTHROPLASTY, HIP, TOTAL,POSTERIOR APPROACH
Anesthesia: Spinal | Site: Hip | Laterality: Right

## 2023-09-18 MED ORDER — ONDANSETRON HCL 4 MG/2ML IJ SOLN
INTRAMUSCULAR | Status: DC | PRN
  Administered 2023-09-18: 4 mg via INTRAVENOUS

## 2023-09-18 MED ORDER — AMLODIPINE BESYLATE 5 MG PO TABS
5.0000 mg | ORAL_TABLET | Freq: Every day | ORAL | Status: DC
  Administered 2023-09-19: 5 mg via ORAL
  Filled 2023-09-18: qty 1

## 2023-09-18 MED ORDER — FENTANYL CITRATE (PF) 100 MCG/2ML IJ SOLN
INTRAMUSCULAR | Status: AC
  Filled 2023-09-18: qty 2

## 2023-09-18 MED ORDER — PANTOPRAZOLE SODIUM 40 MG PO TBEC
40.0000 mg | DELAYED_RELEASE_TABLET | Freq: Every day | ORAL | Status: DC
  Administered 2023-09-18 – 2023-09-19 (×2): 40 mg via ORAL
  Filled 2023-09-18 (×2): qty 1

## 2023-09-18 MED ORDER — VANCOMYCIN HCL 1000 MG IV SOLR
INTRAVENOUS | Status: AC
  Filled 2023-09-18: qty 20

## 2023-09-18 MED ORDER — ASPIRIN 81 MG PO CHEW
81.0000 mg | CHEWABLE_TABLET | Freq: Two times a day (BID) | ORAL | Status: DC
  Administered 2023-09-18 – 2023-09-19 (×2): 81 mg via ORAL
  Filled 2023-09-18 (×2): qty 1

## 2023-09-18 MED ORDER — METHOCARBAMOL 1000 MG/10ML IJ SOLN: 500.0000 mg | Freq: Four times a day (QID) | INTRAMUSCULAR | Status: DC | PRN

## 2023-09-18 MED ORDER — CLOPIDOGREL BISULFATE 75 MG PO TABS
75.0000 mg | ORAL_TABLET | Freq: Every day | ORAL | Status: DC
  Administered 2023-09-19: 75 mg via ORAL
  Filled 2023-09-18: qty 1

## 2023-09-18 MED ORDER — ALBUMIN HUMAN 5 % IV SOLN: INTRAVENOUS | Status: DC | PRN

## 2023-09-18 MED ORDER — METOCLOPRAMIDE HCL 5 MG/ML IJ SOLN: 5.0000 mg | Freq: Three times a day (TID) | INTRAMUSCULAR | Status: DC | PRN

## 2023-09-18 MED ORDER — FENTANYL CITRATE (PF) 100 MCG/2ML IJ SOLN
25.0000 ug | INTRAMUSCULAR | Status: DC | PRN
  Administered 2023-09-18 (×2): 50 ug via INTRAVENOUS

## 2023-09-18 MED ORDER — TRANEXAMIC ACID 1000 MG/10ML IV SOLN
INTRAVENOUS | Status: DC | PRN
  Administered 2023-09-18: 2000 mg via TOPICAL

## 2023-09-18 MED ORDER — PROPOFOL 10 MG/ML IV BOLUS
INTRAVENOUS | Status: DC | PRN
  Administered 2023-09-18 (×2): 20 mg via INTRAVENOUS

## 2023-09-18 MED ORDER — LACTATED RINGERS IV SOLN: INTRAVENOUS | Status: DC

## 2023-09-18 MED ORDER — POVIDONE-IODINE 10 % EX SWAB
2.0000 | Freq: Once | CUTANEOUS | Status: AC
  Administered 2023-09-18: 2 via TOPICAL

## 2023-09-18 MED ORDER — ONDANSETRON HCL 4 MG/2ML IJ SOLN: 4.0000 mg | Freq: Four times a day (QID) | INTRAMUSCULAR | Status: DC | PRN

## 2023-09-18 MED ORDER — TRANEXAMIC ACID-NACL 1000-0.7 MG/100ML-% IV SOLN
1000.0000 mg | INTRAVENOUS | Status: AC
  Administered 2023-09-18: 1000 mg via INTRAVENOUS
  Filled 2023-09-18: qty 100

## 2023-09-18 MED ORDER — FENTANYL CITRATE (PF) 250 MCG/5ML IJ SOLN
INTRAMUSCULAR | Status: DC | PRN
  Administered 2023-09-18 (×2): 50 ug via INTRAVENOUS

## 2023-09-18 MED ORDER — PROPOFOL 500 MG/50ML IV EMUL
INTRAVENOUS | Status: DC | PRN
  Administered 2023-09-18: 30 mg via INTRAVENOUS
  Administered 2023-09-18: 85 ug/kg/min via INTRAVENOUS
  Administered 2023-09-18: 20 mg via INTRAVENOUS

## 2023-09-18 MED ORDER — DEXAMETHASONE SODIUM PHOSPHATE 10 MG/ML IJ SOLN
10.0000 mg | Freq: Once | INTRAMUSCULAR | Status: AC
  Administered 2023-09-19: 10 mg via INTRAVENOUS
  Filled 2023-09-18: qty 1

## 2023-09-18 MED ORDER — VASOPRESSIN 20 UNIT/ML IV SOLN
INTRAVENOUS | Status: AC
  Filled 2023-09-18: qty 1

## 2023-09-18 MED ORDER — CEFAZOLIN SODIUM-DEXTROSE 2-4 GM/100ML-% IV SOLN
2.0000 g | Freq: Four times a day (QID) | INTRAVENOUS | Status: AC
  Administered 2023-09-18 – 2023-09-19 (×2): 2 g via INTRAVENOUS
  Filled 2023-09-18 (×2): qty 100

## 2023-09-18 MED ORDER — PRONTOSAN WOUND IRRIGATION OPTIME
TOPICAL | Status: DC | PRN
  Administered 2023-09-18: 1

## 2023-09-18 MED ORDER — PHENOL 1.4 % MT LIQD: 1.0000 | OROMUCOSAL | Status: DC | PRN

## 2023-09-18 MED ORDER — SODIUM CHLORIDE 0.9 % IR SOLN
Status: DC | PRN
  Administered 2023-09-18: 3000 mL

## 2023-09-18 MED ORDER — PREGABALIN 25 MG PO CAPS
50.0000 mg | ORAL_CAPSULE | Freq: Three times a day (TID) | ORAL | Status: DC
  Administered 2023-09-18 – 2023-09-19 (×4): 50 mg via ORAL
  Filled 2023-09-18 (×4): qty 2

## 2023-09-18 MED ORDER — STERILE WATER FOR INJECTION IJ SOLN
INTRAMUSCULAR | Status: DC | PRN
  Administered 2023-09-18: 2000 mL

## 2023-09-18 MED ORDER — SORBITOL 70 % SOLN: 30.0000 mL | Freq: Every day | Status: DC | PRN

## 2023-09-18 MED ORDER — MIDAZOLAM HCL 2 MG/2ML IJ SOLN
INTRAMUSCULAR | Status: DC | PRN
  Administered 2023-09-18: 1 mg via INTRAVENOUS

## 2023-09-18 MED ORDER — AMISULPRIDE (ANTIEMETIC) 5 MG/2ML IV SOLN: 10.0000 mg | Freq: Once | INTRAVENOUS | Status: DC | PRN

## 2023-09-18 MED ORDER — OXYCODONE HCL 5 MG PO TABS
5.0000 mg | ORAL_TABLET | ORAL | Status: DC | PRN
  Administered 2023-09-18 – 2023-09-19 (×2): 10 mg via ORAL
  Filled 2023-09-18: qty 2
  Filled 2023-09-18 (×2): qty 1

## 2023-09-18 MED ORDER — FENTANYL CITRATE (PF) 250 MCG/5ML IJ SOLN
INTRAMUSCULAR | Status: AC
  Filled 2023-09-18: qty 5

## 2023-09-18 MED ORDER — 0.9 % SODIUM CHLORIDE (POUR BTL) OPTIME
TOPICAL | Status: DC | PRN
  Administered 2023-09-18: 1000 mL

## 2023-09-18 MED ORDER — DIPHENHYDRAMINE HCL 12.5 MG/5ML PO ELIX: 25.0000 mg | ORAL_SOLUTION | ORAL | Status: DC | PRN

## 2023-09-18 MED ORDER — IPRATROPIUM-ALBUTEROL 0.5-2.5 (3) MG/3ML IN SOLN
3.0000 mL | Freq: Once | RESPIRATORY_TRACT | Status: AC
  Administered 2023-09-18: 3 mL via RESPIRATORY_TRACT
  Filled 2023-09-18: qty 3

## 2023-09-18 MED ORDER — ONDANSETRON HCL 4 MG/2ML IJ SOLN
INTRAMUSCULAR | Status: AC
  Filled 2023-09-18: qty 2

## 2023-09-18 MED ORDER — CEFAZOLIN SODIUM-DEXTROSE 2-4 GM/100ML-% IV SOLN
2.0000 g | INTRAVENOUS | Status: AC
  Administered 2023-09-18: 2 g via INTRAVENOUS
  Filled 2023-09-18: qty 100

## 2023-09-18 MED ORDER — OXYCODONE HCL 5 MG/5ML PO SOLN
ORAL | Status: AC
  Filled 2023-09-18: qty 5

## 2023-09-18 MED ORDER — MAGNESIUM CITRATE PO SOLN: 1.0000 | Freq: Once | ORAL | Status: DC | PRN

## 2023-09-18 MED ORDER — LACTATED RINGERS IV SOLN: INTRAVENOUS | Status: DC | PRN

## 2023-09-18 MED ORDER — OXYCODONE HCL 5 MG PO TABS: 10.0000 mg | ORAL_TABLET | ORAL | Status: DC | PRN

## 2023-09-18 MED ORDER — POLYETHYLENE GLYCOL 3350 17 G PO PACK
17.0000 g | PACK | Freq: Every day | ORAL | Status: DC
  Administered 2023-09-18 – 2023-09-19 (×2): 17 g via ORAL
  Filled 2023-09-18 (×2): qty 1

## 2023-09-18 MED ORDER — INSULIN ASPART 100 UNIT/ML IJ SOLN: 0.0000 [IU] | INTRAMUSCULAR | Status: DC | PRN

## 2023-09-18 MED ORDER — MIDAZOLAM HCL 2 MG/2ML IJ SOLN
INTRAMUSCULAR | Status: AC
  Filled 2023-09-18: qty 2

## 2023-09-18 MED ORDER — DOXYCYCLINE HYCLATE 100 MG PO TABS
100.0000 mg | ORAL_TABLET | Freq: Two times a day (BID) | ORAL | Status: DC
  Administered 2023-09-18 – 2023-09-19 (×2): 100 mg via ORAL
  Filled 2023-09-18 (×2): qty 1

## 2023-09-18 MED ORDER — PHENYLEPHRINE HCL-NACL 20-0.9 MG/250ML-% IV SOLN
INTRAVENOUS | Status: DC | PRN
Start: 2023-09-18 — End: 2023-09-18
  Administered 2023-09-18: 50 ug/min via INTRAVENOUS

## 2023-09-18 MED ORDER — TRANEXAMIC ACID-NACL 1000-0.7 MG/100ML-% IV SOLN
1000.0000 mg | Freq: Once | INTRAVENOUS | Status: AC
  Administered 2023-09-18: 1000 mg via INTRAVENOUS
  Filled 2023-09-18 (×2): qty 100

## 2023-09-18 MED ORDER — ALUM & MAG HYDROXIDE-SIMETH 200-200-20 MG/5ML PO SUSP: 30.0000 mL | ORAL | Status: DC | PRN

## 2023-09-18 MED ORDER — OXYCODONE HCL 5 MG PO TABS: 5.0000 mg | ORAL_TABLET | Freq: Once | ORAL | Status: AC | PRN

## 2023-09-18 MED ORDER — ASPIRIN 81 MG PO CHEW: 81.0000 mg | CHEWABLE_TABLET | Freq: Every day | ORAL | 0 refills | Status: DC

## 2023-09-18 MED ORDER — VASOPRESSIN 20 UNIT/ML IV SOLN
INTRAVENOUS | Status: DC | PRN
Start: 2023-09-18 — End: 2023-09-18
  Administered 2023-09-18 (×3): .5 [IU] via INTRAVENOUS
  Administered 2023-09-18: 1 [IU] via INTRAVENOUS
  Administered 2023-09-18: .5 [IU] via INTRAVENOUS
  Administered 2023-09-18 (×2): 1 [IU] via INTRAVENOUS

## 2023-09-18 MED ORDER — CHLORHEXIDINE GLUCONATE 0.12 % MT SOLN
15.0000 mL | Freq: Once | OROMUCOSAL | Status: AC
  Administered 2023-09-18: 15 mL via OROMUCOSAL
  Filled 2023-09-18: qty 15

## 2023-09-18 MED ORDER — ORAL CARE MOUTH RINSE: 15.0000 mL | Freq: Once | OROMUCOSAL | Status: AC

## 2023-09-18 MED ORDER — MUPIROCIN 2 % EX OINT
1.0000 | TOPICAL_OINTMENT | Freq: Two times a day (BID) | CUTANEOUS | Status: DC
  Administered 2023-09-18 – 2023-09-19 (×2): 1 via NASAL
  Filled 2023-09-18 (×2): qty 22

## 2023-09-18 MED ORDER — DOCUSATE SODIUM 100 MG PO CAPS
100.0000 mg | ORAL_CAPSULE | Freq: Two times a day (BID) | ORAL | Status: DC
  Administered 2023-09-18 – 2023-09-19 (×3): 100 mg via ORAL
  Filled 2023-09-18 (×3): qty 1

## 2023-09-18 MED ORDER — ACETAMINOPHEN 500 MG PO TABS
1000.0000 mg | ORAL_TABLET | Freq: Four times a day (QID) | ORAL | Status: AC
  Administered 2023-09-18 – 2023-09-19 (×4): 1000 mg via ORAL
  Filled 2023-09-18 (×4): qty 2

## 2023-09-18 MED ORDER — METHOCARBAMOL 500 MG PO TABS
500.0000 mg | ORAL_TABLET | Freq: Four times a day (QID) | ORAL | Status: DC | PRN
  Administered 2023-09-18 – 2023-09-19 (×2): 500 mg via ORAL
  Filled 2023-09-18 (×2): qty 1

## 2023-09-18 MED ORDER — HYDROMORPHONE HCL 1 MG/ML IJ SOLN: 0.5000 mg | INTRAMUSCULAR | Status: DC | PRN

## 2023-09-18 MED ORDER — ACETAMINOPHEN 325 MG PO TABS: 325.0000 mg | ORAL_TABLET | Freq: Four times a day (QID) | ORAL | Status: DC | PRN

## 2023-09-18 MED ORDER — ONDANSETRON HCL 4 MG PO TABS: 4.0000 mg | ORAL_TABLET | Freq: Four times a day (QID) | ORAL | Status: DC | PRN

## 2023-09-18 MED ORDER — OXYCODONE HCL ER 10 MG PO T12A
10.0000 mg | EXTENDED_RELEASE_TABLET | Freq: Two times a day (BID) | ORAL | Status: DC
  Administered 2023-09-19: 10 mg via ORAL
  Filled 2023-09-18 (×2): qty 1

## 2023-09-18 MED ORDER — BUPIVACAINE-MELOXICAM ER 400-12 MG/14ML IJ SOLN
INTRAMUSCULAR | Status: DC | PRN
  Administered 2023-09-18: 400 mg

## 2023-09-18 MED ORDER — ACETAMINOPHEN 500 MG PO TABS
1000.0000 mg | ORAL_TABLET | Freq: Once | ORAL | Status: AC
Start: 2023-09-18 — End: 2023-09-18
  Administered 2023-09-18: 1000 mg via ORAL
  Filled 2023-09-18: qty 2

## 2023-09-18 MED ORDER — METOCLOPRAMIDE HCL 5 MG PO TABS: 5.0000 mg | ORAL_TABLET | Freq: Three times a day (TID) | ORAL | Status: DC | PRN

## 2023-09-18 MED ORDER — BUPIVACAINE-MELOXICAM ER 400-12 MG/14ML IJ SOLN
INTRAMUSCULAR | Status: AC
  Filled 2023-09-18: qty 1

## 2023-09-18 MED ORDER — VANCOMYCIN HCL 1000 MG IV SOLR
INTRAVENOUS | Status: DC | PRN
  Administered 2023-09-18: 1000 mg

## 2023-09-18 MED ORDER — CHLORHEXIDINE GLUCONATE CLOTH 2 % EX PADS
6.0000 | MEDICATED_PAD | Freq: Every day | CUTANEOUS | Status: DC
  Administered 2023-09-18 – 2023-09-19 (×2): 6 via TOPICAL

## 2023-09-18 MED ORDER — EPHEDRINE SULFATE-NACL 50-0.9 MG/10ML-% IV SOSY
PREFILLED_SYRINGE | INTRAVENOUS | Status: DC | PRN
  Administered 2023-09-18: 10 mg via INTRAVENOUS
  Administered 2023-09-18: 5 mg via INTRAVENOUS

## 2023-09-18 MED ORDER — OXYCODONE HCL 5 MG/5ML PO SOLN
5.0000 mg | Freq: Once | ORAL | Status: AC | PRN
  Administered 2023-09-18: 5 mg via ORAL

## 2023-09-18 MED ORDER — MENTHOL 3 MG MT LOZG: 1.0000 | LOZENGE | OROMUCOSAL | Status: DC | PRN

## 2023-09-18 SURGICAL SUPPLY — 60 items
BAG COUNTER SPONGE SURGICOUNT (BAG) ×1 IMPLANT
BLADE CLIPPER SURG (BLADE) IMPLANT
BLADE SAG 18X100X1.27 (BLADE) IMPLANT
BLADE SAW SGTL 73X25 THK (BLADE) ×1 IMPLANT
BRUSH FEMORAL CANAL (MISCELLANEOUS) IMPLANT
COVER BACK TABLE 24X17X13 BIG (DRAPES) IMPLANT
COVER SURGICAL LIGHT HANDLE (MISCELLANEOUS) ×1 IMPLANT
CUP ACETAB 56MM (Orthopedic Implant) IMPLANT
DRAPE HIP W/POCKET STRL (MISCELLANEOUS) ×1 IMPLANT
DRAPE IMP U-DRAPE 54X76 (DRAPES) ×1 IMPLANT
DRAPE INCISE IOBAN 66X45 STRL (DRAPES) IMPLANT
DRAPE INCISE IOBAN 85X60 (DRAPES) ×2 IMPLANT
DRAPE U-SHAPE 47X51 STRL (DRAPES) ×1 IMPLANT
DRESSING PEEL AND PLC PRVNA 13 (GAUZE/BANDAGES/DRESSINGS) IMPLANT
DRSG MEPILEX POST OP 4X12 (GAUZE/BANDAGES/DRESSINGS) IMPLANT
DRSG MEPILEX POST OP 4X8 (GAUZE/BANDAGES/DRESSINGS) ×1 IMPLANT
DRSG PEEL AND PLACE PREVENA 13 (GAUZE/BANDAGES/DRESSINGS) ×1 IMPLANT
DURAPREP 26ML APPLICATOR (WOUND CARE) ×1 IMPLANT
ELECT BLADE 6.5 EXT (BLADE) IMPLANT
ELECT CAUTERY BLADE 6.4 (BLADE) ×1 IMPLANT
ELECT REM PT RETURN 9FT ADLT (ELECTROSURGICAL) ×1 IMPLANT
ELECTRODE REM PT RTRN 9FT ADLT (ELECTROSURGICAL) ×1 IMPLANT
EVACUATOR 1/8 PVC DRAIN (DRAIN) IMPLANT
GLOVE BIO SURGEON STRL SZ8 (GLOVE) ×1 IMPLANT
GLOVE BIOGEL PI IND STRL 8 (GLOVE) ×1 IMPLANT
GLOVE ORTHO TXT STRL SZ7.5 (GLOVE) ×1 IMPLANT
GOWN STRL REUS W/ TWL LRG LVL3 (GOWN DISPOSABLE) ×2 IMPLANT
GOWN STRL REUS W/ TWL XL LVL3 (GOWN DISPOSABLE) ×4 IMPLANT
HEAD M SROM 36MM PLUS 1.5 (Hips) IMPLANT
KIT BASIN OR (CUSTOM PROCEDURE TRAY) ×1 IMPLANT
KIT TURNOVER KIT B (KITS) ×1 IMPLANT
LINER NEUTRAL 52MMX36MMX56 (Hips) IMPLANT
MANIFOLD NEPTUNE II (INSTRUMENTS) ×1 IMPLANT
NS IRRIG 1000ML POUR BTL (IV SOLUTION) ×1 IMPLANT
PACK TOTAL JOINT (CUSTOM PROCEDURE TRAY) ×1 IMPLANT
PACK UNIVERSAL I (CUSTOM PROCEDURE TRAY) ×1 IMPLANT
PAD ARMBOARD POSITIONER FOAM (MISCELLANEOUS) ×2 IMPLANT
PASSER SUT SWANSON 36MM LOOP (INSTRUMENTS) ×1 IMPLANT
PRESSURIZER FEMORAL UNIV (MISCELLANEOUS) IMPLANT
SCREW 6.5MMX25MM (Screw) IMPLANT
SCREW PINN CAN 6.5X20 (Screw) IMPLANT
SET HNDPC FAN SPRY TIP SCT (DISPOSABLE) IMPLANT
SOLUTION PRONTOSAN WOUND 350ML (IRRIGATION / IRRIGATOR) IMPLANT
SPONGE T-LAP 18X18 ~~LOC~~+RFID (SPONGE) ×1 IMPLANT
SPONGE T-LAP 4X18 ~~LOC~~+RFID (SPONGE) IMPLANT
SROM M HEAD 36MM PLUS 1.5 (Hips) ×1 IMPLANT
STAPLER VISISTAT 35W (STAPLE) ×1 IMPLANT
STEM FEM CMTLS STD RECLAIM 13 (Stem) IMPLANT
SUCTION TUBE FRAZIER 10FR DISP (SUCTIONS) ×1 IMPLANT
SUT ETHIBOND NAB CT1 #1 30IN (SUTURE) ×2 IMPLANT
SUT VIC AB 0 CT1 27XBRD ANBCTR (SUTURE) ×2 IMPLANT
SUT VIC AB 1 CTB1 27 (SUTURE) ×2 IMPLANT
SUT VIC AB 2-0 CT1 TAPERPNT 27 (SUTURE) ×2 IMPLANT
SUT VICRYL 4-0 PS2 18IN ABS (SUTURE) IMPLANT
TOWEL GREEN STERILE (TOWEL DISPOSABLE) ×1 IMPLANT
TOWEL GREEN STERILE FF (TOWEL DISPOSABLE) ×1 IMPLANT
TOWER CARTRIDGE SMART MIX (DISPOSABLE) IMPLANT
TRAY CATH INTERMITTENT SS 16FR (CATHETERS) IMPLANT
TRAY FOLEY MTR SLVR 16FR STAT (SET/KITS/TRAYS/PACK) IMPLANT
WATER STERILE IRR 1000ML POUR (IV SOLUTION) ×3 IMPLANT

## 2023-09-18 NOTE — Evaluation (Signed)
 Physical Therapy Evaluation Patient Details Name: George Valdez MRN: [id number] DOB: Mar 31, 1956 Today's Date: 09/18/2023  History of Present Illness  68 y.o. male presents to 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 PMH includes HTN, CAD, PAD, CVA, alcohol abuse.  Clinical Impression  Pt presents to PT with deficits in functional mobility, gait, balance, strength, power, endurance. Pt is able to stand at this time and initiate gait training. PT notes the pt tends to rest with RLE in slight internal rotation, pt reports this to be typical over the last year when he thinks he broke his femur. PT provides verbal cues throughout session for posterior hip precautions, with emphasis on no internal rotation passed neutral. PT will follow up for further mobility training in an effort to allow the pt to return to ambulation. PT recommends a return to SNF with rehab services when medically appropriate.        If plan is discharge home, recommend the following: A lot of help with walking and/or transfers;A lot of help with bathing/dressing/bathroom;Assistance with cooking/housework;Assist for transportation;Help with stairs or ramp for entrance   Can travel by private vehicle   No    Equipment Recommendations Other (comment) (defer to post-acute venue, pt reports owning a RW and MWC)  Recommendations for Other Services       Functional Status Assessment Patient has had a recent decline in their functional status and demonstrates the ability to make significant improvements in function in a reasonable and predictable amount of time.     Precautions / Restrictions Precautions Precautions: Fall;Posterior Hip;Other (comment) (wound vac) Precaution Booklet Issued: Yes (comment) Recall of Precautions/Restrictions: Impaired Precaution/Restrictions Comments: verbal cues for precautions during mobility, pt reports having RLE inversion since hip fx which he believes  happened ~1 year ago Restrictions Weight Bearing Restrictions Per Provider Order: Yes Other Position/Activity Restrictions: WBAT RLE      Mobility  Bed Mobility Overal bed mobility: Needs Assistance Bed Mobility: Supine to Sit, Sit to Supine     Supine to sit: Min assist, HOB elevated Sit to supine: Min assist   General bed mobility comments: verbal cues to limit hip flexion, assist for RLE and assist to pivot hips with bed pad    Transfers Overall transfer level: Needs assistance Equipment used: Rolling walker (2 wheels) Transfers: Sit to/from Stand Sit to Stand: Min assist                Ambulation/Gait Ambulation/Gait assistance: Editor, commissioning (Feet): 3 Feet Assistive device: Rolling walker (2 wheels) Gait Pattern/deviations: Step-to pattern Gait velocity: reduced Gait velocity interpretation: <1.31 ft/sec, indicative of household ambulator   General Gait Details: slowed step-to gait, verbal cues for RLE external rotation as pt's RLE tends to rest in internal rotation due to old hip fx  Stairs            Wheelchair Mobility     Tilt Bed    Modified Rankin (Stroke Patients Only)       Balance Overall balance assessment: Needs assistance Sitting-balance support: No upper extremity supported, Feet supported Sitting balance-Leahy Scale: Fair     Standing balance support: Bilateral upper extremity supported, Reliant on assistive device for balance Standing balance-Leahy Scale: Poor                               Pertinent Vitals/Pain Pain Assessment Pain Assessment: 0-10 Pain Score: 9  Pain Location: R hip Pain Descriptors / Indicators: Sore Pain Intervention(s): Patient requesting pain meds-RN notified    Home Living Family/patient expects to be discharged to:: Skilled nursing facility                   Additional Comments: pt is a long term resident at [facility]    Prior Function Prior Level of Function  : Needs assist             Mobility Comments: pt was ambulatory prior to a year ago when he believes he broke his femur. Pt has been standign and pivoting to a wheelchair and independently mobilizing in wheelchair since this time ADLs Comments: assist for IADLs from staff     Extremity/Trunk Assessment   Upper Extremity Assessment Upper Extremity Assessment: LUE deficits/detail LUE Deficits / Details: grossly 4/5    Lower Extremity Assessment Lower Extremity Assessment: LLE deficits/detail;RLE deficits/detail RLE Deficits / Details: generalized post-op weakness, knee and ankle ROM WFL, hip not fully assessed due to posterior precautions LLE Deficits / Details: grossly 4/5    Cervical / Trunk Assessment Cervical / Trunk Assessment: Normal  Communication   Communication Communication: No apparent difficulties    Cognition Arousal: Alert Behavior During Therapy: WFL for tasks assessed/performed   PT - Cognitive impairments: Memory                       PT - Cognition Comments: recall of precautions is currently impaired, will require reinforcement Following commands: Intact       Cueing Cueing Techniques: Verbal cues     General Comments General comments (skin integrity, edema, etc.): pt in NAD, wound vac intact    Exercises Other Exercises Other Exercises: PT provides education on surgical hip exercise packet, supine and seated exercise only at this time. Will need further education on standing exercise after pt builds standing tolerance   Assessment/Plan    PT Assessment Patient needs continued PT services  PT Problem List Decreased strength;Decreased activity tolerance;Decreased balance;Decreased mobility;Decreased knowledge of use of DME;Decreased safety awareness;Decreased knowledge of precautions;Pain       PT Treatment Interventions DME instruction;Gait training;Stair training;Functional mobility training;Therapeutic exercise;Therapeutic  activities;Balance training;Neuromuscular re-education;Cognitive remediation;Patient/family education    PT Goals (Current goals can be found in the Care Plan section)  Acute Rehab PT Goals Patient Stated Goal: to return to walking PT Goal Formulation: With patient Time For Goal Achievement: 10/02/23 Potential to Achieve Goals: Fair    Frequency Min 3X/week     Co-evaluation               AM-PAC PT "6 Clicks" Mobility  Outcome Measure Help needed turning from your back to your side while in a flat bed without using bedrails?: A Little Help needed moving from lying on your back to sitting on the side of a flat bed without using bedrails?: A Little Help needed moving to and from a bed to a chair (including a wheelchair)?: A Little Help needed standing up from a chair using your arms (e.g., wheelchair or bedside chair)?: A Little Help needed to walk in hospital room?: Total Help needed climbing 3-5 steps with a railing? : Total 6 Click Score: 14    End of Session Equipment Utilized During Treatment: Gait belt Activity Tolerance: Patient tolerated treatment well Patient left: in bed;with call bell/phone within reach;with bed alarm set Nurse Communication: Mobility status PT Visit Diagnosis: Other abnormalities of gait and mobility (R26.89);Muscle weakness (generalized) (  M62.81);Pain Pain - Right/Left: Right Pain - part of body: Hip    Time: 1610-9604 PT Time Calculation (min) (ACUTE ONLY): 26 min   Charges:   PT Evaluation $PT Eval Low Complexity: 1 Low   PT General Charges $$ ACUTE PT VISIT: 1 Visit         Arlyss Gandy, PT, DPT Acute Rehabilitation Office [phone]   Arlyss Gandy 09/18/2023, 5:15 PM

## 2023-09-18 NOTE — Op Note (Addendum)
 RIGHT TOTAL HIP ARTHROPLASTY-POSTERIOR  Procedure Note JUDE NACLERIO   [id number]  Pre-op Diagnosis: right femoral neck nonunion     Post-op Diagnosis: same  Operative Findings Nonunion of right femoral neck fracture Leg length discrepancy   Operative Procedures  1.  Total hip replacement; Right hip; uncemented [id number]  2.  Application of incisional VAC.  CPT 40981  Surgeon: Gershon Mussel, M.D.  Assist: Oneal Grout, PA-C   Anesthesia: Spinal  Prosthesis: Depuy Acetabulum: Pinnacle Gription 56 mm multihole, 6.5 mm cancellous screws x 3 Femur: Reclaim 15 monoblock standard Head: 36 mm size: +1.5 Liner: +4 with 10 degree lip liner Bearing Type: Metal on poly  Total Hip Arthroplasty Op Note:  After informed consent was obtained and the operative extremity marked in the holding area, the patient was brought back to the operating room.  Spinal anesthesia was administered and the patient was positioned in the left lateral decubitus position with all bony prominences well-padded. Next, the operative extremity was prepped and draped in normal sterile fashion. Surgical timeout occurred verifying patient identification, surgical site, surgical procedure and administration of antibiotics.   An incision was created over the lateral aspect of the hip and a standard posterior approach to the hip was utilized.  IT band was incised in line with the incision.  The gluteus maximus muscle was split bluntly with my fingers.  The trochanteric bursa was chronically inflamed.  This was removed.  A Charnley retractor was placed to protect the sciatic nerve.  The pericapsular fat was removed.  A T-shaped capsulotomy was performed which was taken with the piriformis and the short external rotators.  There was a small amount of traumatic seroma in the hip joint.  Nothing looked infected.  The hip was then dislocated.  The femoral neck fracture was a nonunion and was shortened.  Oscillating  saw was used to free the femoral head from the neck.  The saw was then used to freshen the neck cut just above the lesser trochanter.  The calcar was intact.  Soft tissues were then cleared from the shoulder at the greater trochanter to improve visualization.  The posterior capsule and portions of the anterior capsule had to be removed in order to mobilize the femur anteriorly.  We then turned our attention to acetabular preparation.  Reaming was first performed with a 51 mm down to the floor the cotyloid fossa.  Sequential reaming was performed to 55 mm.  There was excellent bed of bleeding bone.  The bone quality was not great.  Therefore I chose to use a multihole GRIPTION cup.  This was impacted down and approximately 20 degrees of anteversion and 45 degrees of abduction.  3 cancellous screws were placed for additional fixation of the cup.  A trial liner with a 10 degree lip was placed.  We then turned our attention to preparation of the femur.  The leg with this internally rotated with the lower leg perpendicular to the floor.  Although the calcar was intact, I was concerned about the quality of the bone to use a proximally engaging stem therefore I chose to use a distally engaging revision stem.  Canal finder was used to gain entry into the canal.  Proximal femur was then lateralized and sequential reaming was performed to a 15 stem which gave excellent fixation.  Proximal femur was prepped.  A high offset trial neck was placed with a +1.5 head ball and the construct was reduced.  This restored his leg  length but I felt this was overly tight.  The hip joint was stable to 90 degrees of flexion and greater than 80 degrees of internal rotation.  Took a lot of effort to dislocate the hip.  The trial components were then removed.  The femoral canal was thoroughly irrigated.  I chose a standard offset neck.  The final stem was impacted down to the proper depth.  A +1.5 head ball was placed on the trunnion and the  construct was reduced.  Again trialing showed that the hip was stable to 90 degrees of flexion and over 80 degrees of internal rotation.  Again it took a lot of effort to dislocate the hip.  I was happy with the restoration of the leg length.   We irrigated, obtained hemostasis.  A topical mixture of 0.25% bupivacaine and meloxicam was placed deep to the fascia.  One gram of vancomycin powder was placed in the surgical bed.   One gram of topical tranexamic acid was injected into the joint.  The fascia was closed with #1 stratafix, the deep fat layer was closed with 0 vicryl, the subcutaneous layers closed with 2.0 Vicryl Plus and the skin closed with 2-0 nylon and incisional VAC. A sterile dressing was applied.  Hip abduction pillow was placed.  The patient was awakened in the operating room and taken to recovery in stable condition.  All sponge, needle, and instrument counts were correct at the end of the case.   Tessa Lerner, my PA, was a medical necessity for opening, closing, limb positioning, retracting, exposing, and overall facilitation and timely completion of the surgery.  Position: supine  Complications: see description of procedure.  Time Out: performed   Drains/Packing: none  Estimated blood loss: see anesthesia record  Returned to Recovery Room: in good condition.   Antibiotics: yes   Mechanical VTE (DVT) Prophylaxis: sequential compression devices, TED thigh-high  Chemical VTE (DVT) Prophylaxis: Resume aspirin and Plavix.  Fluid Replacement: see anesthesia record  Specimens Removed: 1 to pathology   Sponge and Instrument Count Correct? yes   PACU: portable radiograph - low AP   Plan/RTC: Return in 2 weeks for staple removal. Weight Bearing/Load Lower Extremity: full  Hip precautions: none Suture Removal: 2 weeks   N. George Arvin, MD [facility] 11:35 AM   Implant Name Type Inv. Item Serial No. Manufacturer Lot No. LRB No. Used Action  CUP ACETAB  - [id number] Orthopedic Implant CUP ACETAB  [employer]PU[employer] [id number] Right 1 Implanted  SCREW 6.5MMX25MM - [id number] Screw SCREW 6.5MMX25MM  DEPU[employer]TH[employer] [id number] Right 1 Implanted  SCREW 6.5MMX25MM - [id number] Screw SCREW 6.5MMX25MM  DEPU[employer]TH[employer] [id number] Right 1 Implanted  SCREW PINN CAN 6.5X20 - [id number] Screw SCREW PINN CAN 6.5X20  DEPU[employer] ORTH[employer] [id number] Right 1 Implanted  STEM FEM CMTLS STD RECLAIM 13 - [id number] Stem STEM FEM CMTLS STD RECLAIM 13  [employer]PU[employer] [id number] Right 1 Implanted  LINER NEUTRAL [id number] - [id number] Hips LINER NEUTRAL [id number]  DEPUY ORTH[employer] [id number] Right 1 Implanted  SROM M HEAD PLUS 1.5 - [id number] Hips SROM M HEAD PLUS 1.5  DEPU[employer] ORTH[employer] [id number] Right 1 Implanted

## 2023-09-18 NOTE — Progress Notes (Signed)
   09/18/23 1500  TOC Brief Assessment  Insurance and Status Reviewed (Medicare A and B)  Patient has primary care physician Yes (Mallipeddi, Vishnu P, MD  Cardiology)  Home environment has been reviewed from home with Wife  Prior level of function: independent  Prior/Current Home Services No current home services  Social Drivers of Health Review SDOH reviewed no interventions necessary  Readmission risk has been reviewed Yes (N/A listed)  Transition of care needs no transition of care needs at this time   Will follow for needs. Anticipate possibly CIR or HH  for hip repair

## 2023-09-18 NOTE — H&P (Signed)
 PREOPERATIVE H&P  Chief Complaint: right femoral neck nonunion  HPI: George Valdez is a 68 y.o. male who presents for surgical treatment of right femoral neck nonunion.  He denies any changes in medical history.  Past Surgical History:  Procedure Laterality Date  . BIOPSY  09/02/2021   Procedure: BIOPSY;  Surgeon: Lanelle Bal, DO;  Location: [facility];  Service: Endoscopy;;  . CARDIAC CATHETERIZATION    . CAROTID ENDARTERECTOMY    . COLONOSCOPY WITH PROPOFOL N/A 09/02/2021   Procedure: COLONOSCOPY WITH PROPOFOL;  Surgeon: Lanelle Bal, DO;  Location: [facility];  Service: Endoscopy;  Laterality: N/A;  11:45am  . ESOPHAGOGASTRODUODENOSCOPY (EGD) WITH PROPOFOL N/A 09/02/2021   Procedure: ESOPHAGOGASTRODUODENOSCOPY (EGD) WITH PROPOFOL;  Surgeon: Lanelle Bal, DO;  [facility];  Service: Endoscopy;  Laterality: N/A;  . POLYPECTOMY  09/02/2021   Procedure: POLYPECTOMY;  Surgeon: Lanelle Bal, DO;  Location: [facility];  Service: Endoscopy;;  . TOOTH EXTRACTION N/A 08/31/2022   Procedure: DENTAL RESTORATION/EXTRACTIONS;  Surgeon: Ocie Doyne, DMD;  Location: [facility];  Service: Oral Surgery;  Laterality: N/A;   Social History   Socioeconomic History  . Marital status: Married    Spouse name: Not on file  . Number of children: Not on file  . Years of education: Not on file  . Highest education level: Not on file  Occupational History  . Not on file  Tobacco Use  . Smoking status: Every Day    Current packs/day: 0.25    Average packs/day: 0.3 packs/day for 40.0 years (10.0 ttl pk-yrs)    Types: Cigarettes  . Smokeless tobacco: Never  . Tobacco comments:    down to 5 cigarettes/day from 1.5 PPD  Vaping Use  . Vaping status: Never Used  Substance and Sexual Activity  . Alcohol use: Not Currently    Alcohol/week: 12.0 standard drinks of alcohol    Types: 12 Standard drinks or equivalent per week  . Drug use: No  . Sexual activity: Not  on file  Other Topics Concern  . Not on file  Social History Narrative  . Not on file   Social Drivers of Health   Financial Resource Strain: Not on file  Food Insecurity: Not on file  Transportation Needs: Not on file  Physical Activity: Not on file  Stress: Not on file  Social Connections: Not on file   Family History  Problem Relation Age of Onset  . Congestive Heart Failure Mother 31  . Heart attack Father 64  . Congestive Heart Failure Sister   . Colon cancer Neg Hx   . Colon polyps Neg Hx    Allergies  Allergen Reactions  . Codeine Itching   Prior to Admission medications   Medication Sig Start Date End Date Taking? Authorizing Provider  acetaminophen (TYLENOL) 325 MG tablet Take 650 mg by mouth every 4 (four) hours as needed (pain.).   Yes [provider]  amLODipine (NORVASC) 5 MG tablet Take 1 tablet (5 mg total) by mouth daily. 01/06/23  Yes Mallipeddi, Vishnu P, MD  clopidogrel (PLAVIX) 75 MG tablet Take 1 tablet (75 mg total) by mouth daily. Patient taking differently: Take 75 mg by mouth every evening. 07/21/21  Yes Vassie Loll, MD  cyanocobalamin (,VITAMIN B-12,) 1000 MCG/ML injection Inject 1,000 mcg into the muscle every 30 (thirty) days.   Yes [provider]  diphenhydrAMINE (BENADRYL) 25 MG tablet Take 50 mg by mouth every 4 (four)  hours as needed for itching.   Yes [provider]  DULoxetine (CYMBALTA) 20 MG capsule Take 40 mg by mouth daily.   Yes [provider]  ezetimibe (ZETIA) 10 MG tablet Take 1 tablet (10 mg total) by mouth daily. Patient taking differently: Take 10 mg by mouth at bedtime. 10/07/19 06/09/24 Yes Laqueta Linden, MD  famotidine (PEPCID) 20 MG tablet Take 20 mg by mouth daily.   Yes [provider]  HYDROcodone-acetaminophen (NORCO) 7.5-325 MG tablet Take 1 tablet by mouth 3 (three) times daily.   Yes [provider]  Magnesium 400 MG TABS Take 400 mg by mouth at bedtime.    Yes [provider]  Melatonin 3 MG CAPS Take 6 mg by mouth at bedtime.   Yes [provider]  Multiple Vitamin (MULTIVITAMIN WITH MINERALS) TABS tablet Take 1 tablet by mouth daily. 09/12/14  Yes Standley Brooking, MD  pantoprazole (PROTONIX) 40 MG tablet Take 1 tablet (40 mg total) by mouth daily. 07/17/21  Yes Vassie Loll, MD  potassium chloride 20 MEQ TBCR Take 20 mEq by mouth daily. 08/23/16  Yes Erick Blinks, MD  pregabalin (LYRICA) 50 MG capsule Take 50 mg by mouth 3 (three) times daily. 03/31/23  Yes [provider]  rosuvastatin (CRESTOR) 40 MG tablet Take 40 mg by mouth every evening. (2100)   Yes [provider]  thiamine 100 MG tablet Take 1 tablet (100 mg total) by mouth daily. 12/03/15  Yes Houston Siren, MD  tiotropium (SPIRIVA) 18 MCG inhalation capsule Place 18 mcg into inhaler and inhale daily.   Yes [provider]  traZODone (DESYREL) 100 MG tablet Take 100 mg by mouth at bedtime.   Yes [provider]  vitamin E 400 UNIT capsule Take 400 Units by mouth daily.   Yes [provider]  docusate sodium (COLACE) 100 MG capsule Take 1 capsule (100 mg total) by mouth daily as needed. 09/12/23 09/11/24  Cristie Hem, PA-C  doxycycline (VIBRA-TABS) 100 MG tablet Take 1 tablet (100 mg total) by mouth 2 (two) times daily. To be taken after surgery 09/12/23   Cristie Hem, PA-C  methocarbamol (ROBAXIN-750) 750 MG tablet Take 1 tablet (750 mg total) by mouth 3 (three) times daily as needed for muscle spasms. 09/12/23   Cristie Hem, PA-C  ondansetron (ZOFRAN) 4 MG tablet Take 1 tablet (4 mg total) by mouth every 8 (eight) hours as needed for nausea or vomiting. 09/12/23   Cristie Hem, PA-C  oxyCODONE-acetaminophen (PERCOCET) 5-325 MG tablet Take 1-2 tablets by mouth every 6 (six) hours as needed. To be taken after surgery 09/12/23   Cristie Hem, PA-C     Positive ROS: All other systems have been reviewed and were  otherwise negative with the exception of those mentioned in the HPI and as above.  Physical Exam: General: Alert, no acute distress Cardiovascular: No pedal edema Respiratory: No cyanosis, no use of accessory musculature GI: abdomen soft Skin: No lesions in the area of chief complaint Neurologic: Sensation intact distally Psychiatric: Patient is competent for consent with normal mood and affect Lymphatic: no lymphedema  MUSCULOSKELETAL: exam stable  Assessment: right femoral neck nonunion  Plan: Plan for Procedure(s): RIGHT TOTAL HIP ARTHROPLASTY-POSTERIOR  The risks benefits and alternatives were discussed with the patient including but not limited to the risks of nonoperative treatment, versus surgical intervention including infection, bleeding, nerve injury,  blood clots, cardiopulmonary complications, morbidity, mortality, among others, and they were  willing to proceed.   Glee Arvin, MD 09/18/2023 6:40 AM

## 2023-09-18 NOTE — Transfer of Care (Signed)
 Immediate Anesthesia Transfer of Care Note  Patient: George Valdez  Procedure(s) Performed: RIGHT TOTAL HIP ARTHROPLASTY-POSTERIOR (Right: Hip)  Patient Location: PACU  Anesthesia Type:Spinal  Level of Consciousness: awake, alert , and oriented  Airway & Oxygen Therapy: Patient Spontanous Breathing and Patient connected to nasal cannula oxygen  Post-op Assessment: Report given to RN and Post -op Vital signs reviewed and stable  Post vital signs: Reviewed and stable  Last Vitals:  Vitals Value Taken Time  BP 101/51 09/18/23 1153  Temp    Pulse 71 09/18/23 1155  Resp 24 09/18/23 1155  SpO2 91 % 09/18/23 1155  Vitals shown include unfiled device data.  Last Pain:  Vitals:   09/18/23 0735  TempSrc:   PainSc: 0-No pain         Complications: No notable events documented.

## 2023-09-18 NOTE — Discharge Instructions (Signed)

## 2023-09-19 ENCOUNTER — Encounter (HOSPITAL_COMMUNITY): Payer: Self-pay | Admitting: Orthopaedic Surgery

## 2023-09-19 ENCOUNTER — Telehealth: Payer: Self-pay | Admitting: Orthopaedic Surgery

## 2023-09-19 DIAGNOSIS — S72001K Fracture of unspecified part of neck of right femur, subsequent encounter for closed fracture with nonunion: Secondary | ICD-10-CM | POA: Diagnosis not present

## 2023-09-19 MED ORDER — ASPIRIN 81 MG PO CHEW: 81.0000 mg | CHEWABLE_TABLET | Freq: Every day | ORAL | 0 refills | Status: AC

## 2023-09-19 MED ORDER — BUPIVACAINE IN DEXTROSE 0.75-8.25 % IT SOLN
INTRATHECAL | Status: DC | PRN
  Administered 2023-09-18: 1.8 mL via INTRATHECAL

## 2023-09-19 MED ORDER — OXYCODONE-ACETAMINOPHEN 5-325 MG PO TABS: 1.0000 | ORAL_TABLET | Freq: Four times a day (QID) | ORAL | 0 refills | Status: AC | PRN

## 2023-09-19 MED ORDER — DOXYCYCLINE HYCLATE 100 MG PO TABS: 100.0000 mg | ORAL_TABLET | Freq: Two times a day (BID) | ORAL | 0 refills | Status: AC

## 2023-09-19 NOTE — Discharge Summary (Signed)
 Patient ID: George Valdez MRN: [id number] DOB/AGE: 69-Sep-1957 69 y.o.  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 Active Problems:   Status post total replacement of right hip   Discharge Diagnoses:  Same  Past Medical History:  Diagnosis Date   Alcohol abuse    Anxiety    Asthma    BPH (benign prostatic hyperplasia)    Chronic pain    Constipation    COPD (chronic obstructive pulmonary disease) (HCC)    Coronary artery disease    w/ stents   Depression    Diabetes mellitus without complication (HCC)    type 2 no meds, diet controlled   Frequent falls    Hyperlipemia    Hypertension    Myocardial infarction (HCC)    Neuropathy    Pernicious anemia    Stroke (HCC)    hx 3 strokes, left sided weakness   Uses walker    a couple times a day to move around   Wheelchair dependent     Surgeries: Procedure(s): RIGHT TOTAL HIP ARTHROPLASTY-POSTERIOR on 09/18/2023   Consultants:   Discharged Condition: Improved  Hospital Course: George Valdez is an 68 y.o. male who was admitted 09/18/2023 for operative treatment ofClosed displaced fracture of right femoral neck with nonunion. Patient has severe unremitting pain that affects sleep, daily activities, and work/hobbies. After pre-op clearance the patient was taken to the operating room on 09/18/2023 and underwent  Procedure(s): RIGHT TOTAL HIP ARTHROPLASTY-POSTERIOR.    Patient was given perioperative antibiotics:  Anti-infectives (From admission, onward)    Start     Dose/Rate Route Frequency Ordered Stop   09/19/23 0000  doxycycline (VIBRA-TABS) 100 MG tablet        100 mg Oral 2 times daily 09/19/23 0743     09/18/23 2200  doxycycline (VIBRA-TABS) tablet 100 mg       Note to Pharmacy: To be taken after surgery     100 mg Oral 2 times daily 09/18/23 1401     09/18/23 2000  ceFAZolin (ANCEF)  IVPB 2g/100 mL premix        2 g 200 mL/hr over 30 Minutes Intravenous Every 6 hours 09/18/23 1916 09/19/23 0324   09/18/23 0835  vancomycin (VANCOCIN) powder  Status:  Discontinued          As needed 09/18/23 0836 09/18/23 1150   09/18/23 0645  ceFAZolin (ANCEF) IVPB 2g/100 mL premix        2 g 200 mL/hr over 30 Minutes Intravenous On call to O.R. 09/18/23 9562 09/18/23 1308        Patient was given sequential compression devices, early ambulation, and chemoprophylaxis to prevent DVT.  Patient benefited maximally from hospital stay and there were no complications.    Recent vital signs: Patient Vitals for the past 24 hrs:  BP Temp Temp src Pulse Resp SpO2  09/19/23 0413 (!) 114/59 (!) 97.5 F (36.4 C) Oral 88 18 92 %  09/19/23 0046 (!) 126/56 98.2 F (36.8 C) Oral 96 18 93 %  09/18/23 2037 -- -- -- -- -- 98 %  09/18/23 1926 (!) 141/68 97.8 F (36.6 C) Oral 95 19 97 %  09/18/23 1701 (!) 135/58 (!) 97.5 F (36.4 C) Oral 98 18 92 %  09/18/23 1409 (!) 133/47 -- -- 87 18 91 %  09/18/23 1345 (!) 143/66 -- -- 93 (!) 26 99 %  09/18/23 1330 (!)  104/52 -- -- 81 17 95 %  09/18/23 1315 (!) 116/58 -- -- 83 18 92 %  09/18/23 1300 135/61 -- -- 81 19 95 %  09/18/23 1245 134/60 -- -- 79 18 91 %  09/18/23 1230 (!) 146/76 -- -- 78 17 100 %  09/18/23 1215 131/67 -- -- 78 16 98 %  09/18/23 1200 (!) 108/48 -- -- 73 16 91 %  09/18/23 1153 (!) 101/51 -- -- 72 (!) 23 92 %  09/18/23 1151 (!) 101/51 (!) 96.7 F (35.9 C) -- 72 (!) 21 95 %     Recent laboratory studies:  Recent Labs    09/18/23 0652  WBC 7.0  HGB 15.1  HCT 47.6  PLT 222  NA 138  K 4.5  CL 108  CO2 21*  BUN 12  CREATININE 1.25*  GLUCOSE 94  CALCIUM 9.1     Discharge Medications:   Allergies as of 09/19/2023       Reactions   Codeine Itching        Medication List     STOP taking these medications    acetaminophen 325 MG tablet Commonly known as: TYLENOL   HYDROcodone-acetaminophen 7.5-325 MG  tablet Commonly known as: NORCO       TAKE these medications    amLODipine 5 MG tablet Commonly known as: NORVASC Take 1 tablet (5 mg total) by mouth daily.   aspirin 81 MG chewable tablet Commonly known as: Aspirin 81 Chew 1 tablet (81 mg total) by mouth daily. Take one aspirin 81 mg daily with plavix for 6 weeks post-op   clopidogrel 75 MG tablet Commonly known as: PLAVIX Take 1 tablet (75 mg total) by mouth daily. What changed: when to take this   cyanocobalamin 1000 MCG/ML injection Commonly known as: VITAMIN B12 Inject 1,000 mcg into the muscle every 30 (thirty) days.   diphenhydrAMINE 25 MG tablet Commonly known as: BENADRYL Take 50 mg by mouth every 4 (four) hours as needed for itching.   docusate sodium 100 MG capsule Commonly known as: Colace Take 1 capsule (100 mg total) by mouth daily as needed.   doxycycline 100 MG tablet Commonly known as: VIBRA-TABS Take 1 tablet (100 mg total) by mouth 2 (two) times daily. To be taken after surgery   DULoxetine 20 MG capsule Commonly known as: CYMBALTA Take 40 mg by mouth daily.   ezetimibe 10 MG tablet Commonly known as: ZETIA Take 1 tablet (10 mg total) by mouth daily. What changed: when to take this   famotidine 20 MG tablet Commonly known as: PEPCID Take 20 mg by mouth daily.   Magnesium 400 MG Tabs Take 400 mg by mouth at bedtime.   Melatonin 3 MG Caps Take 6 mg by mouth at bedtime.   methocarbamol 750 MG tablet Commonly known as: Robaxin-750 Take 1 tablet (750 mg total) by mouth 3 (three) times daily as needed for muscle spasms.   multivitamin with minerals Tabs tablet Take 1 tablet by mouth daily.   ondansetron 4 MG tablet Commonly known as: Zofran Take 1 tablet (4 mg total) by mouth every 8 (eight) hours as needed for nausea or vomiting.   oxyCODONE-acetaminophen 5-325 MG tablet Commonly known as: Percocet Take 1-2 tablets by mouth every 6 (six) hours as needed. To be taken after surgery    pantoprazole 40 MG tablet Commonly known as: PROTONIX Take 1 tablet (40 mg total) by mouth daily.   Potassium Chloride ER 20 MEQ Tbcr Take 20 mEq by  mouth daily.   pregabalin 50 MG capsule Commonly known as: LYRICA Take 50 mg by mouth 3 (three) times daily.   rosuvastatin 40 MG tablet Commonly known as: CRESTOR Take 40 mg by mouth every evening. (2100)   thiamine 100 MG tablet Commonly known as: VITAMIN B1 Take 1 tablet (100 mg total) by mouth daily.   tiotropium 18 MCG inhalation capsule Commonly known as: SPIRIVA Place 18 mcg into inhaler and inhale daily.   traZODone 100 MG tablet Commonly known as: DESYREL Take 100 mg by mouth at bedtime.   vitamin E 180 MG (400 UNITS) capsule Take 400 Units by mouth daily.               Durable Medical Equipment  (From admission, onward)           Start     Ordered   09/18/23 1402  DME Walker rolling  Once       Question:  Patient needs a walker to treat with the following condition  Answer:  History of hip replacement   09/18/23 1401   09/18/23 1402  DME 3 n 1  Once        09/18/23 1401   09/18/23 1402  DME Bedside commode  Once       Question:  Patient needs a bedside commode to treat with the following condition  Answer:  History of hip replacement   09/18/23 1401            Diagnostic Studies: DG Pelvis Portable Result Date: 09/18/2023 CLINICAL DATA:  Post op right hip arthroplasty.  Pain. EXAM: PORTABLE PELVIS 1-2 VIEWS COMPARISON:  None Available. FINDINGS: There is a total right hip arthroplasty. The arthroplasty components appear intact and in anatomic alignment. There is no acute fracture or dislocation. The bones are osteopenic. Postsurgical changes in the soft tissues of the right hip. Multiple surgical clips noted in the right groin. IMPRESSION: Status post total right hip arthroplasty. Electronically Signed   By: Elgie Collard M.D.   On: 09/18/2023 17:27    Disposition:      Follow-up  Information     Cristie Hem, PA-C. Schedule an appointment as soon as possible for a visit in 1 week(s).   Specialty: Orthopedic Surgery Contact information: [address] [place] [address] [phone]                  Signed: Cristie Hem 09/19/2023, 7:44 AM

## 2023-09-19 NOTE — NC FL2 (Signed)
 [place] MEDICAID FL2 LEVEL OF CARE FORM     IDENTIFICATION  Patient Name: George Valdez Birthdate: 12/24/1955 Sex: male Admission Date (Current Location): 09/18/2023  [facility] and [place] Number:  Producer, television/film/video and Address:  The . [facility], 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 coli UTI (urinary tract infection) 07/12/2021   Rhabdomyolysis 07/10/2021   Generalized weakness 07/10/2021   Failure to thrive in adult 07/10/2021   Physical deconditioning 07/10/2021   Transaminitis 07/10/2021   Hypomagnesemia 07/10/2021   Leukocytosis 07/10/2021   Dehydration 07/10/2021   Mixed hyperlipidemia 07/10/2021   History of stroke 07/10/2021   Altered mental status 08/22/2016   Aspiration pneumonia (HCC) 08/22/2016   UTI (urinary tract infection) 08/22/2016   Protein-calorie malnutrition, severe 07/30/2016   Falls 07/22/2016   Weakness of left side of body 07/22/2016   h/o CVA (cerebral vascular accident) [facility]) with left sided weakness 07/22/2016   Alcohol abuse 07/22/2016   Hypotension 07/22/2016   Frequent falls     Anemia 11/30/2015   Hypokalemia 11/30/2015   AKI (acute kidney injury) (HCC) 11/30/2015   Acute encephalopathy 09/10/2014   Alcohol withdrawal delirium (HCC) 09/10/2014   Acute kidney injury superimposed on CKD (HCC) 09/10/2014   Sinus bradycardia 09/10/2014   Essential hypertension 09/10/2014   CAD (coronary artery disease) 09/10/2014   PAD (peripheral artery disease) (HCC) 09/10/2014   Pernicious anemia 09/10/2014    Orientation RESPIRATION BLADDER Height & Weight     Self, Time, Situation, Place  Normal Continent Weight: 153 lb (69.4 kg) Height:  5\' 3"  (160 cm)  BEHAVIORAL SYMPTOMS/MOOD NEUROLOGICAL BOWEL NUTRITION STATUS      Continent Diet (Please see discharge summary)  AMBULATORY STATUS COMMUNICATION OF NEEDS Skin   Extensive Assist Verbally Other (Comment) (Wound/Incision LDAs,Incision closed,Hip,R,Neg.pressure wound therapy,Hip,R)                       Personal Care Assistance Level of Assistance  Bathing, Feeding, Dressing Bathing Assistance: Maximum assistance Feeding assistance: Independent Dressing Assistance: Maximum assistance     Functional Limitations Info  Sight, Hearing, Speech Sight Info: Adequate Hearing Info: Adequate Speech Info: Adequate    SPECIAL CARE FACTORS FREQUENCY  PT (By licensed PT), OT (By licensed OT)     PT Frequency: 5x min weekly OT Frequency: 5x min weekly            Contractures Contractures Info: Not present    Additional Factors Info  Code Status, Allergies, Psychotropic Code Status Info: FULL Allergies Info: Codeine Psychotropic Info: pregabalin (LYRICA) capsule 50 mg 3 times daily  Current Medications (09/19/2023):  This is the current hospital active medication list Current Facility-Administered Medications  Medication Dose Route Frequency Provider Last Rate Last Admin   acetaminophen (TYLENOL) tablet 325-650 mg  325-650 mg Oral Q6H PRN Tarry Kos, MD       alum & mag hydroxide-simeth  (MAALOX/MYLANTA) 200-200-20 MG/5ML suspension 30 mL  30 mL Oral Q4H PRN Tarry Kos, MD       amLODipine (NORVASC) tablet 5 mg  5 mg Oral Daily Tarry Kos, MD   5 mg at 09/19/23 1028   aspirin chewable tablet 81 mg  81 mg Oral BID Tarry Kos, MD   81 mg at 09/19/23 1029   Chlorhexidine Gluconate Cloth 2 % PADS 6 each  6 each Topical Daily Tarry Kos, MD   6 each at 09/19/23 1029   clopidogrel (PLAVIX) tablet 75 mg  75 mg Oral Daily Tarry Kos, MD   75 mg at 09/19/23 1028   diphenhydrAMINE (BENADRYL) 12.5 MG/5ML elixir 25 mg  25 mg Oral Q4H PRN Tarry Kos, MD       docusate sodium (COLACE) capsule 100 mg  100 mg Oral BID Tarry Kos, MD   100 mg at 09/19/23 1028   doxycycline (VIBRA-TABS) tablet 100 mg  100 mg Oral BID Tarry Kos, MD   100 mg at 09/19/23 1028   HYDROmorphone (DILAUDID) injection 0.5-1 mg  0.5-1 mg Intravenous Q4H PRN Tarry Kos, MD       magnesium citrate solution 1 Bottle  1 Bottle Oral Once PRN Tarry Kos, MD       menthol-cetylpyridinium (CEPACOL) lozenge 3 mg  1 lozenge Oral PRN Tarry Kos, MD       Or   phenol (CHLORASEPTIC) mouth spray 1 spray  1 spray Mouth/Throat PRN Tarry Kos, MD       methocarbamol (ROBAXIN) tablet 500 mg  500 mg Oral Q6H PRN Tarry Kos, MD   500 mg at 09/18/23 2112   Or   methocarbamol (ROBAXIN) injection 500 mg  500 mg Intravenous Q6H PRN Tarry Kos, MD       metoCLOPramide (REGLAN) tablet 5-10 mg  5-10 mg Oral Q8H PRN Tarry Kos, MD       Or   metoCLOPramide (REGLAN) injection 5-10 mg  5-10 mg Intravenous Q8H PRN Tarry Kos, MD       mupirocin ointment (BACTROBAN) 2 % 1 Application  1 Application Nasal BID Tarry Kos, MD   1 Application at 09/19/23 1028   ondansetron (ZOFRAN) tablet 4 mg  4 mg Oral Q6H PRN Tarry Kos, MD       Or   ondansetron [facility]) injection 4 mg  4 mg Intravenous Q6H PRN Tarry Kos, MD       oxyCODONE (Oxy IR/ROXICODONE) immediate release tablet 10-15 mg  10-15 mg Oral  Q4H PRN Tarry Kos, MD       oxyCODONE (Oxy IR/ROXICODONE) immediate release tablet 5-10 mg  5-10 mg Oral Q4H PRN Tarry Kos, MD   10 mg at 09/18/23 2112   oxyCODONE (OXYCONTIN) 12 hr tablet 10 mg  10 mg Oral Q12H Tarry Kos, MD   10 mg at 09/19/23 1028   pantoprazole (PROTONIX) EC tablet 40 mg  40 mg Oral Daily Tarry Kos, MD   40 mg at 09/19/23 1027   polyethylene glycol (MIRALAX / GLYCOLAX) packet 17 g  17  g Oral Daily Tarry Kos, MD   17 g at 09/19/23 1027   pregabalin (LYRICA) capsule 50 mg  50 mg Oral TID Tarry Kos, MD   50 mg at 09/19/23 1028   sorbitol 70 % solution 30 mL  30 mL Oral Daily PRN Tarry Kos, MD         Discharge Medications: Please see discharge summary for a list of discharge medications.  Relevant Imaging Results:  Relevant Lab Results:   Additional Information [id number]  Delilah Shan, LCSWA

## 2023-09-19 NOTE — Care Management Obs Status (Signed)
 MEDICARE OBSERVATION STATUS NOTIFICATION   Patient Details  Name: George Valdez MRN: [id number] Date of Birth: [birth year]   Medicare Observation Status Notification Given:  Yes    Sherilyn Banker 09/19/2023, 10:51 AM

## 2023-09-19 NOTE — Progress Notes (Signed)
 Pt scheduled fro discharge back to [facility] this pm. Report called and given to [place] at the receiving facility. Discharge scripts attached to AVS. Wound vac switched over to prevnar

## 2023-09-19 NOTE — Telephone Encounter (Signed)
 done

## 2023-09-19 NOTE — Anesthesia Procedure Notes (Signed)
 Spinal  Patient location during procedure: OR Start time: 09/18/2023 9:05 AM End time: 09/18/2023 9:07 AM Reason for block: surgical anesthesia Staffing Performed: anesthesiologist  Anesthesiologist: Elmer Picker, MD Performed by: Elmer Picker, MD Authorized by: Elmer Picker, MD   Preanesthetic Checklist Completed: patient identified, IV checked, risks and benefits discussed, surgical consent, monitors and equipment checked, pre-op evaluation and timeout performed Spinal Block Patient position: sitting Prep: DuraPrep and site prepped and draped Patient monitoring: cardiac monitor, continuous pulse ox and blood pressure Approach: midline Location: L3-4 Injection technique: single-shot Needle Needle type: Pencan  Needle gauge: 24 G Needle length: 9 cm Assessment Sensory level: T6 Events: CSF return Additional Notes Functioning IV was confirmed and monitors were applied. Sterile prep and drape, including hand hygiene and sterile gloves were used. The patient was positioned and the spine was prepped. The skin was anesthetized with lidocaine.  Free flow of clear CSF was obtained prior to injecting local anesthetic into the CSF.  The spinal needle aspirated freely following injection.  The needle was carefully withdrawn.  The patient tolerated the procedure well.

## 2023-09-19 NOTE — TOC Initial Note (Addendum)
 Transition of Care [facility]) - Initial/Assessment Note    Patient Details  Name: George Valdez MRN: [id number] Date of Birth: September 06, 1955  [facility]) CM/SW Contact:    Delilah Shan, LCSWA Phone Number: 09/19/2023, 1:20 PM  Clinical Narrative:                  CSW received consult for possible SNF placement at time of discharge. Patient is from [facility].Devonne Doughty with [facility] rehab admissions confirmed patient can return back when medically stable and can receive physical therapy under his part B benefit.CSW spoke with patient regarding PT recommendation of SNF placement at time of discharge. Patient confirmed he comes from [facility]. Patient expressed understanding of PT recommendation and is agreeable to receive short term rehab when he returns to facility under his part B benefit. No further questions reported at this time. CSW to continue to follow and assist with discharge planning needs.   Expected Discharge Plan: Skilled Nursing Facility Barriers to Discharge: Continued Medical Work up   Patient Goals and CMS Choice Patient states their goals for this hospitalization and ongoing recovery are:: SNF CMS Medicare.gov Compare Post Acute Care list provided to:: Patient Choice offered to / list presented to : Patient      Expected Discharge Plan and Services In-house Referral: Clinical Social Work     Living arrangements for the past 2 months: Skilled Nursing Facility                                      Prior Living Arrangements/Services Living arrangements for the past 2 months: Skilled Nursing Facility Lives with:: Facility Resident Patient language and need for interpreter reviewed:: Yes Do you feel safe going back to the place where you live?: Yes      Need for Family Participation in Patient Care: Yes (Comment) Care giver support system in place?: Yes (comment)   Criminal Activity/Legal Involvement Pertinent to Current  Situation/Hospitalization: No - Comment as needed  Activities of Daily Living   ADL Screening (condition at time of admission) Independently performs ADLs?: Yes (appropriate for developmental age) Is the patient deaf or have difficulty hearing?: No Does the patient have difficulty seeing, even when wearing glasses/contacts?: No Does the patient have difficulty concentrating, remembering, or making decisions?: No  Permission Sought/Granted Permission sought to share information with : Case Manager, Family Supports, Oceanographer granted to share information with : Yes, Verbal Permission Granted  Share Information with NAME: Kathie Rhodes  Permission granted to share info w AGENCY: SNF  Permission granted to share info w Relationship: spouse  Permission granted to share info w Contact Information: Kathie Rhodes [phone]  Emotional Assessment   Attitude/Demeanor/Rapport: Gracious Affect (typically observed): Calm Orientation: : Oriented to Self, Oriented to Place, Oriented to  Time, Oriented to Situation Alcohol / Substance Use: Not Applicable Psych Involvement: No (comment)  Admission diagnosis:  Status post total replacement of right hip [Z96.641] Patient Active Problem List   Diagnosis Date Noted   Status post total replacement of right hip 09/18/2023   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 coli UTI (urinary tract infection) 07/12/2021   Rhabdomyolysis 07/10/2021   Generalized weakness 07/10/2021   Failure to thrive in adult 07/10/2021   Physical deconditioning 07/10/2021   Transaminitis 07/10/2021   Hypomagnesemia 07/10/2021   Leukocytosis  07/10/2021   Dehydration 07/10/2021   Mixed hyperlipidemia 07/10/2021   History of stroke 07/10/2021   Altered mental status 08/22/2016   Aspiration pneumonia (HCC) 08/22/2016   UTI (urinary tract infection) 08/22/2016   Protein-calorie malnutrition, severe 07/30/2016   Falls 07/22/2016   Weakness of left side of body 07/22/2016   h/o CVA (cerebral vascular accident) (HCC) with left sided weakness 07/22/2016   Alcohol abuse 07/22/2016   Hypotension 07/22/2016   Frequent falls    Anemia 11/30/2015   Hypokalemia 11/30/2015   AKI (acute kidney injury) (HCC) 11/30/2015   Acute encephalopathy 09/10/2014   Alcohol withdrawal delirium (HCC) 09/10/2014   Acute kidney injury superimposed on CKD (HCC) 09/10/2014   Sinus bradycardia 09/10/2014   Essential hypertension 09/10/2014   CAD (coronary artery disease) 09/10/2014   PAD (peripheral artery disease) (HCC) 09/10/2014   Pernicious anemia 09/10/2014   PCP:  Patient, No Pcp Per Pharmacy:   [facility]  [place], [place] - [address] [place] [address] Phone: [phone] Fax: [phone]     Social Drivers of Health (SDOH) Social History: SDOH Screenings   Food Insecurity: No Food Insecurity (09/18/2023)  Housing: Unknown (09/18/2023)  Transportation Needs: No Transportation Needs (09/18/2023)  Utilities: Not At Risk (09/18/2023)  Social Connections: Socially Isolated (09/18/2023)  Tobacco Use: High Risk (09/18/2023)   SDOH Interventions:     Readmission Risk Interventions     No data to display

## 2023-09-19 NOTE — Plan of Care (Signed)

## 2023-09-19 NOTE — TOC Transition Note (Signed)
 Transition of Care [facility]) - Discharge Note   Patient Details  Name: George Valdez MRN: [id number] Date of Birth: 01/13/1956  Transition of Care [facility]) CM/SW Contact:  Delilah Shan, LCSWA Phone Number: 09/19/2023, 3:45 PM   Clinical Narrative:     Patient will DC to: [facility] SNF  Anticipated DC date: 09/19/2023  Family notified: Victorino Dike  Transport by: Sharin Mons  ?  Per MD patient ready for DC to [facility] . RN, patient, patient's family, and facility notified of DC. Discharge Summary sent to facility. RN given number for report [phone] RM#[id number] DC packet on chart. Ambulance transport requested for patient.  CSW signing off.   Final next level of care: Skilled Nursing Facility Barriers to Discharge: No Barriers Identified   Patient Goals and CMS Choice Patient states their goals for this hospitalization and ongoing recovery are:: SNF CMS Medicare.gov Compare Post Acute Care list provided to:: Patient Choice offered to / list presented to : Patient      Discharge Placement              Patient chooses bed at:  [facility]) Patient to be transferred to facility by: [facility] Name of family member notified: Victorino Dike Patient and family notified of of transfer: 09/19/23  Discharge Plan and Services Additional resources added to the After Visit Summary for   In-house Referral: Clinical Social Work                                   Social Drivers of Health (SDOH) Interventions SDOH Screenings   Food Insecurity: No Food Insecurity (09/18/2023)  Housing: Unknown (09/18/2023)  Transportation Needs: No Transportation Needs (09/18/2023)  Utilities: Not At Risk (09/18/2023)  Social Connections: Socially Isolated (09/18/2023)  Tobacco Use: High Risk (09/18/2023)     Readmission Risk Interventions     No data to display

## 2023-09-19 NOTE — Telephone Encounter (Signed)
 Pt requesting discharge orders. Best call back [phone]

## 2023-09-19 NOTE — Progress Notes (Signed)
 SATURATION QUALIFICATIONS: (This note is used to comply with regulatory documentation for home oxygen)  Patient Saturations on Room Air at Rest = 85%  Patient Saturations on Room Air while Ambulating = N/A  Patient Saturations on 2 Liters of oxygen while Ambulating = 90%  Please briefly explain why patient needs home oxygen: Pt hypoxic on RA at rest, with SpO2 85% and below after cues for pursed-lip breathing. Pt placed on 2L O2 [place] at rest and SpO2 improved to 90% and above after 1-2 minutes with cues for pursed-lip breathing and maintains WFL with pivot transfer to chair on 2L O2 [place].

## 2023-09-19 NOTE — Progress Notes (Addendum)
 Subjective: 1 Day Post-Op Procedure(s) (LRB): RIGHT TOTAL HIP ARTHROPLASTY-POSTERIOR (Right) Patient reports pain as mild.    Objective: Vital signs in last 24 hours: Temp:  [96.7 F (35.9 C)-98.2 F (36.8 C)] 97.5 F (36.4 C) (03/25 0413) Pulse Rate:  [72-98] 88 (03/25 0413) Resp:  [16-26] 18 (03/25 0413) BP: (101-146)/(47-76) 114/59 (03/25 0413) SpO2:  [91 %-100 %] 92 % (03/25 0413)  Intake/Output from previous day: 03/24 0701 - 03/25 0700 In: 1904.2 [P.O.:240; I.V.:1164.2; IV Piggyback:500] Out: 1725 [Urine:1025; Blood:700] Intake/Output this shift: No intake/output data recorded.  Recent Labs    09/18/23 0652  HGB 15.1   Recent Labs    09/18/23 0652  WBC 7.0  RBC 5.23  HCT 47.6  PLT 222   Recent Labs    09/18/23 0652  NA 138  K 4.5  CL 108  CO2 21*  BUN 12  CREATININE 1.25*  GLUCOSE 94  CALCIUM 9.1   No results for input(s): "LABPT", "INR" in the last 72 hours.  Neurovascular intact Sensation intact distally Intact pulses distally Dorsiflexion/Plantar flexion intact Incision: dressing C/D/I No cellulitis present Compartment soft Ivac in place with good seal.  No fluid in canister   Assessment/Plan: 1 Day Post-Op Procedure(s) (LRB): RIGHT TOTAL HIP ARTHROPLASTY-POSTERIOR (Right) Advance diet Up with therapy D/C IV fluids Discharge to SNF - patient is a permanent resident at [facility] RLE- posterior hip precautions Hip abduction pillow at all times until spinal has completely worn off Continue Ivac- Please swap out hospital unit for portable prevena unit DAY OF DISCHARGE      Cristie Hem 09/19/2023, 7:41 AM

## 2023-09-19 NOTE — Progress Notes (Signed)
 Physical Therapy Treatment Patient Details Name: George Valdez MRN: [id number] DOB: 06-Mar-1956 Today's Date: 09/19/2023   History of Present Illness 68 y.o. 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 PMH includes HTN, CAD, PAD, CVA, alcohol abuse.    PT Comments  Pt received in supine, agreeable to therapy session and eager to mobilize OOB to chair. Pt needing increased time and dense cues for teachback on supine/seated HEP exercises and up to modA for standing balance during standing hip flexion and heel raises at RW. Pt needing up to modA for safety with step pivot from bed to chair ~20ft. Pt with somewhat ataxic stepping pattern and multiple losses of balance using RW. Pt hypoxic on RA when PTA arrived, RN notified and 2L O2 [place] placed to maintain SpO2 >88% with supine and seated postures. Patient will benefit from continued inpatient follow up therapy, <3 hours/day to return to PLOF.      If plan is discharge home, recommend the following: A lot of help with bathing/dressing/bathroom;Assistance with cooking/housework;Assist for transportation;Help with stairs or ramp for entrance;Two people to help with walking and/or transfers   Can travel by private vehicle     No  Equipment Recommendations  Other (comment) (defer to post-acute venue, pt reports owning a RW and MWC)    Recommendations for Other Services       Precautions / Restrictions Precautions Precautions: Fall;Posterior Hip;Other (comment) (wound vac) Precaution Booklet Issued: Yes (comment) Recall of Precautions/Restrictions: Impaired Precaution/Restrictions Comments: verbal cues for precautions during mobility, pt reports having RLE inversion since hip fx which he believes happened ~1 year ago Required Braces or Orthoses:  (hip abduction pillow in supine) Restrictions Weight Bearing Restrictions Per Provider Order: Yes RLE Weight Bearing Per Provider Order: Weight bearing as  tolerated     Mobility  Bed Mobility Overal bed mobility: Needs Assistance Bed Mobility: Supine to Sit     Supine to sit: Min assist, HOB elevated, Used rails     General bed mobility comments: verbal cues to limit hip flexion, assist for RLE and assist to pivot hips with bed pad. Increased time/effort to perform.    Transfers Overall transfer level: Needs assistance Equipment used: Rolling walker (2 wheels) Transfers: Sit to/from Stand Sit to Stand: Min assist                Ambulation/Gait Ambulation/Gait assistance: Mod assist Gait Distance (Feet): 5 Feet Assistive device: Rolling walker (2 wheels) Gait Pattern/deviations: Step-to pattern, Ataxic, Decreased step length - right, Decreased step length - left, Trunk flexed, Wide base of support, Decreased dorsiflexion - right, Decreased dorsiflexion - left Gait velocity: reduced     General Gait Details: slowed step-to gait, verbal cues for RLE external rotation as pt's RLE tends to rest in internal rotation due to old hip fx. Pt LLE with inconsistent foot placement and somewhat ataxic pattern throughout, with pt having difficulty safely managing RW and multiple losses of balance requiring min to modA to correct.   Stairs             Wheelchair Mobility     Tilt Bed    Modified Rankin (Stroke Patients Only)       Balance Overall balance assessment: Needs assistance Sitting-balance support: No upper extremity supported, Feet supported Sitting balance-Leahy Scale: Fair     Standing balance support: Bilateral upper extremity supported, During functional activity, Reliant on assistive device for balance Standing balance-Leahy Scale: Poor  Communication Communication Communication: No apparent difficulties  Cognition Arousal: Alert Behavior During Therapy: WFL for tasks assessed/performed   PT - Cognitive impairments: Memory, Problem solving,  Safety/Judgement                       PT - Cognition Comments: recall of precautions is currently impaired, will require reinforcement, pt tending to maintain RLE in IR at rest despite hip abduction pillow and reminders. Pt instructed on need to work on pursed-lip breathing as he was received on RA with SpO2 83-85%, with pursed lip breathing SpO2 remains at 88% and below, RN also notified. Pt without significant dyspnea and said normal for him is SpO2 in 70's, PTA instructed him that this is not safe and he needs to let staff know to monitor his O2 if he is off [place]. Following commands: Intact      Cueing Cueing Techniques: Verbal cues  Exercises Total Joint Exercises Ankle Circles/Pumps: AROM, Both, 10 reps, Supine Quad Sets: AROM, Both, 10 reps, Supine Heel Slides: AAROM, Right, 10 reps, Supine Hip ABduction/ADduction: AAROM, Right, 10 reps, Supine Long Arc Quad: AROM, Right, 10 reps, Seated Marching in Standing: AROM, Right, 10 reps, Standing (cues to maintain RLE hip flexion <90 deg due to precs) Other Exercises: Incentive Spirometer x10 reps, pt achieves ~1,000 mL when performing correctly after teachback. Other Exercises: standing BLE AROM: heel raises x10 reps ea at RW (modA for stability)    General Comments General comments (skin integrity, edema, etc.): wound vac dressing appears c/d/i, screen on and no alarms sounding; pt hypoxic with SpO2 83-85% on RA when PTA arrived to his room, 2L O2 [place] replaced during session with improvement in SpO2 saturations, RN notified and entered into his flowsheet.      Pertinent Vitals/Pain Pain Assessment Pain Assessment: Faces Faces Pain Scale: Hurts even more Pain Location: R hip with exercises (esp RLE ER and abduction) and weight bearing Pain Descriptors / Indicators: Sore, Guarding, Grimacing Pain Intervention(s): Monitored during session, Limited activity within patient's tolerance, Premedicated before session, Repositioned, Ice  applied    Home Living                          Prior Function            PT Goals (current goals can now be found in the care plan section) Acute Rehab PT Goals Patient Stated Goal: to return to walking PT Goal Formulation: With patient Time For Goal Achievement: 10/02/23 Progress towards PT goals: Progressing toward goals    Frequency    Min 3X/week      PT Plan      Co-evaluation              AM-PAC PT "6 Clicks" Mobility   Outcome Measure  Help needed turning from your back to your side while in a flat bed without using bedrails?: A Little Help needed moving from lying on your back to sitting on the side of a flat bed without using bedrails?: A Lot (w/o rail) Help needed moving to and from a bed to a chair (including a wheelchair)?: A Lot Help needed standing up from a chair using your arms (e.g., wheelchair or bedside chair)?: A Lot Help needed to walk in hospital room?: Total Help needed climbing 3-5 steps with a railing? : Total 6 Click Score: 11    End of Session Equipment Utilized During Treatment: Gait belt;Oxygen Activity Tolerance: Patient  tolerated treatment well Patient left: with call bell/phone within reach;in chair;with chair alarm set Nurse Communication: Mobility status;Other (comment) (SpO2) PT Visit Diagnosis: Other abnormalities of gait and mobility (R26.89);Muscle weakness (generalized) (M62.81);Pain Pain - Right/Left: Right Pain - part of body: Hip     Time: 1355-1457 PT Time Calculation (min) (ACUTE ONLY): 62 min  Charges:    $Gait Training: 8-22 mins $Therapeutic Exercise: 23-37 mins $Therapeutic Activity: 8-22 mins PT General Charges $$ ACUTE PT VISIT: 1 Visit                     Mckensi Redinger P., PTA Acute Rehabilitation Services Secure Chat Preferred 9a-5:30pm Office: [phone]    Angus Palms 09/19/2023, 5:18 PM

## 2023-09-19 NOTE — Anesthesia Postprocedure Evaluation (Signed)
 Anesthesia Post Note  Patient: George Valdez  Procedure(s) Performed: RIGHT TOTAL HIP ARTHROPLASTY-POSTERIOR (Right: Hip)     Patient location during evaluation: PACU Anesthesia Type: Spinal Level of consciousness: oriented and awake and alert Pain management: pain level controlled Vital Signs Assessment: post-procedure vital signs reviewed and stable Respiratory status: spontaneous breathing, respiratory function stable and patient connected to nasal cannula oxygen Cardiovascular status: blood pressure returned to baseline and stable Postop Assessment: no headache, no backache and no apparent nausea or vomiting Anesthetic complications: no  No notable events documented.  Last Vitals:  Vitals:   09/19/23 1410 09/19/23 1420  BP:    Pulse:  99  Resp:    Temp:    SpO2: (!) 87% 92%    Last Pain:  Vitals:   09/19/23 1617  TempSrc:   PainSc: 8    Pain Goal: Patients Stated Pain Goal: 3 (09/18/23 1345)                 Cherylann Hobday L Bartlett Enke

## 2023-09-25 ENCOUNTER — Telehealth: Payer: Self-pay | Admitting: Physician Assistant

## 2023-09-25 NOTE — Telephone Encounter (Signed)
 Yes leave it until follow up appointment.  It should be 1 week postop because he has a prevena.

## 2023-09-25 NOTE — Telephone Encounter (Signed)
 George Valdez called from [facility] said that his wound vac is dry and is he suppose to leave it til he see the doctor? There is no instructions on it. CB#[phone]

## 2023-09-26 ENCOUNTER — Telehealth: Payer: Self-pay

## 2023-09-26 NOTE — Telephone Encounter (Signed)
 Called and spoke with Larita Fife at 11:30am about this. It is to stay on until his follow up next week.

## 2023-09-26 NOTE — Telephone Encounter (Signed)
 He can come in here to have it removed and we can apply dry, sterile bandage, or they can do it there and come in here two weeks po

## 2023-09-26 NOTE — Telephone Encounter (Signed)
 Called and notified Larita Fife with [facility].

## 2023-09-26 NOTE — Telephone Encounter (Signed)
 Christine from [facility] Left Vm on triage line  and would like wound vac instructions. Do they keep it on until next appt 10/03/2023 or do they take it off.   DOS: 09/18/2023- R THA  CB [phone] ext [phone]

## 2023-10-03 ENCOUNTER — Ambulatory Visit: Payer: Medicare Other | Admitting: Physician Assistant

## 2023-10-03 DIAGNOSIS — Z96641 Presence of right artificial hip joint: Secondary | ICD-10-CM

## 2023-10-03 NOTE — Progress Notes (Signed)
 George Valdez

## 2023-10-03 NOTE — Progress Notes (Signed)
 Post-Op Visit Note   Patient: George Valdez           Date of Birth: [birth year]           MRN: [id number] Visit Date: 10/03/2023 PCP: Patient, No Pcp Per   Assessment & Plan:  Chief Complaint:  Chief Complaint  Patient presents with   Right Hip - Pain   Visit Diagnoses:  1. Status post total replacement of right hip     Plan: Patient is a pleasant 68 year old gentleman who comes in today 2 weeks status post right total hip replacement from a femoral neck nonunion, date of surgery 09/18/2023.  He has been doing great.  He has been living at a skilled nursing facility and getting physical therapy.  He is ambulating with a walker while in PT.  He is on chronic aspirin and Plavix.  Examination of the right hip reveals a well-healing surgical incision with nylon sutures in place.  No evidence of infection or cellulitis.  Calves are soft nontender.  He is neurovascularly intact distally.  Today, both sutures were removed and Steri-Strips applied.  Postop instructions provided.  Continue with his baseline aspirin and Plavix.  Continue working with PT weightbearing as tolerated.  We do not need to implement any posterior hip precautions.  Follow-up in 4 weeks for repeat evaluation and [facility] pelvis x-rays.  Call with concerns or questions. Follow-Up Instructions: Return in about 4 weeks (around 10/31/2023).   Orders:  No orders of the defined types were placed in this encounter.  No orders of the defined types were placed in this encounter.   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 coli UTI (urinary tract infection) 07/12/2021   Rhabdomyolysis 07/10/2021   Generalized weakness 07/10/2021   Failure to thrive in adult 07/10/2021   Physical deconditioning 07/10/2021   Transaminitis 07/10/2021   Hypomagnesemia 07/10/2021   Leukocytosis 07/10/2021   Dehydration 07/10/2021   Mixed hyperlipidemia 07/10/2021   History of stroke 07/10/2021   Altered mental status 08/22/2016   Aspiration pneumonia (HCC) 08/22/2016   UTI (urinary tract infection) 08/22/2016   Protein-calorie malnutrition, severe 07/30/2016   Falls 07/22/2016   Weakness of left side of body 07/22/2016   h/o CVA (cerebral vascular accident) (HCC) with left sided weakness 07/22/2016   Alcohol abuse 07/22/2016   Hypotension 07/22/2016   Frequent falls    Anemia 11/30/2015   Hypokalemia 11/30/2015   AKI (acute kidney injury) (HCC) 11/30/2015   Acute encephalopathy 09/10/2014   Alcohol withdrawal delirium (HCC) 09/10/2014   Acute kidney injury superimposed on CKD (HCC) 09/10/2014   Sinus bradycardia 09/10/2014   Essential hypertension 09/10/2014   CAD (coronary artery disease) 09/10/2014   PAD (peripheral artery disease) (HCC) 09/10/2014   Pernicious anemia 09/10/2014   Past Medical History:  Diagnosis Date   Alcohol abuse    Anxiety    Asthma    BPH (benign prostatic hyperplasia)    Chronic pain    Constipation    COPD (chronic obstructive pulmonary disease) (  HCC)    Coronary artery disease    w/ stents   Depression    Diabetes mellitus without complication (HCC)    type 2 no meds, diet controlled   Frequent falls    Hyperlipemia    Hypertension    Myocardial infarction (HCC)    Neuropathy    Pernicious anemia    Stroke (HCC)    hx 3 strokes, left sided weakness   Uses walker    a couple times a day to move around   Wheelchair dependent     Family History  Problem Relation Age of Onset   Congestive Heart Failure  Mother 63   Heart attack Father 26   Congestive Heart Failure Sister    Colon cancer Neg Hx    Colon polyps Neg Hx     Past Surgical History:  Procedure Laterality Date   BIOPSY  09/02/2021   Procedure: BIOPSY;  Surgeon: Lanelle Bal, DO;  Location: [facility];  Service: Endoscopy;;   CARDIAC CATHETERIZATION     CAROTID ENDARTERECTOMY     COLONOSCOPY WITH PROPOFOL N/A 09/02/2021   Procedure: COLONOSCOPY WITH PROPOFOL;  Surgeon: Lanelle Bal, DO;  [facility]: [facility];  Service: Endoscopy;  Laterality: N/A;  11:45am   ESOPHAGOGASTRODUODENOSCOPY (EGD) WITH PROPOFOL N/A 09/02/2021   Procedure: ESOPHAGOGASTRODUODENOSCOPY (EGD) WITH PROPOFOL;  Surgeon: Lanelle Bal, DO;  [facility];  Service: Endoscopy;  Laterality: N/A;   POLYPECTOMY  09/02/2021   Procedure: POLYPECTOMY;  Surgeon: Lanelle Bal, DO;  [facility];  Service: Endoscopy;;   TOOTH EXTRACTION N/A 08/31/2022   Procedure: DENTAL RESTORATION/EXTRACTIONS;  Surgeon: Ocie Doyne, DMD;  Location: [facility];  Service: Oral Surgery;  Laterality: N/A;   TOTAL HIP ARTHROPLASTY Right 09/18/2023   Procedure: RIGHT TOTAL HIP ARTHROPLASTY-POSTERIOR;  Surgeon: Tarry Kos, MD;  Location: [facility];  Service: Orthopedics;  Laterality: Right;  3-C   Social History   Occupational History   Not on file  Tobacco Use   Smoking status: Every Day    Current packs/day: 0.25    Average packs/day: 0.3 packs/day for 40.0 years (10.0 ttl pk-yrs)    Types: Cigarettes   Smokeless tobacco: Never   Tobacco comments:    down to 5 cigarettes/day from 1.5 PPD  Vaping Use   Vaping status: Never Used  Substance and Sexual Activity   Alcohol use: Not Currently    Alcohol/week: 12.0 standard drinks of alcohol    Types: 12 Standard drinks or equivalent per week   Drug use: No   Sexual activity: Not on file

## 2023-10-31 ENCOUNTER — Other Ambulatory Visit (INDEPENDENT_AMBULATORY_CARE_PROVIDER_SITE_OTHER): Payer: Self-pay

## 2023-10-31 ENCOUNTER — Ambulatory Visit (INDEPENDENT_AMBULATORY_CARE_PROVIDER_SITE_OTHER): Admitting: Physician Assistant

## 2023-10-31 ENCOUNTER — Encounter: Payer: Self-pay | Admitting: Physician Assistant

## 2023-10-31 DIAGNOSIS — Z96641 Presence of right artificial hip joint: Secondary | ICD-10-CM

## 2023-10-31 NOTE — Progress Notes (Signed)
 Post-Op Visit Note   Patient: George Valdez           Date of Birth: 22-Feb-1956           MRN: [id number] Visit Date: 10/31/2023 PCP: Patient, No Pcp Per   Assessment & Plan:  Chief Complaint:  Chief Complaint  Patient presents with   Right Hip - Follow-up    Right total hip arthroplasty 09/18/2023   Visit Diagnoses:  1. Status post total replacement of right hip     Plan: Patient is a 68 year old gentleman who comes in today 6 weeks status post right posterior total hip replacement from a femoral neck nonunion, date of surgery 09/18/2023.  He has been doing great.  He is chronically on aspirin  and Plavix .  He permanently lives at a skilled nursing facility and gets PT daily.  He ambulates with a walker during PT.  Examination of his right hip: Painless hip flexion and logroll.  He is neurovascularly intact distally.  At this point, he will continue with PT.  No posterior hip precautions.  He will follow-up with us  in 6 weeks for recheck.  Call with concerns or questions in the meantime.  Follow-Up Instructions: Return in about 6 weeks (around 12/12/2023).   Orders:  Orders Placed This Encounter  Procedures   XR HIP UNILAT W OR W/O PELVIS 2-3 VIEWS RIGHT   No orders of the defined types were placed in this encounter.   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 coli UTI (urinary tract infection) 07/12/2021    Rhabdomyolysis 07/10/2021   Generalized weakness 07/10/2021   Failure to thrive in adult 07/10/2021   Physical deconditioning 07/10/2021   Transaminitis 07/10/2021   Hypomagnesemia 07/10/2021   Leukocytosis 07/10/2021   Dehydration 07/10/2021   Mixed hyperlipidemia 07/10/2021   History of stroke 07/10/2021   Altered mental status 08/22/2016   Aspiration pneumonia (HCC) 08/22/2016   UTI (urinary tract infection) 08/22/2016   Protein-calorie malnutrition, severe 07/30/2016   Falls 07/22/2016   Weakness of left side of body 07/22/2016   h/o CVA (cerebral vascular accident) (HCC) with left sided weakness 07/22/2016   Alcohol abuse 07/22/2016   Hypotension 07/22/2016   Frequent falls    Anemia 11/30/2015   Hypokalemia 11/30/2015   AKI (acute kidney injury) (HCC) 11/30/2015   Acute encephalopathy 09/10/2014   Alcohol withdrawal delirium (HCC) 09/10/2014   Acute kidney injury superimposed on CKD (HCC) 09/10/2014   Sinus bradycardia 09/10/2014   Essential hypertension 09/10/2014   CAD (coronary artery disease) 09/10/2014   PAD (peripheral artery disease) (HCC) 09/10/2014   Pernicious anemia 09/10/2014   Past Medical History:  Diagnosis Date   Alcohol abuse    Anxiety    Asthma    BPH (benign prostatic hyperplasia)    Chronic pain    Constipation    COPD (chronic obstructive pulmonary disease) (HCC)    Coronary artery disease    w/  stents   Depression    Diabetes mellitus without complication (HCC)    type 2 no meds, diet controlled   Frequent falls    Hyperlipemia    Hypertension    Myocardial infarction (HCC)    Neuropathy    Pernicious anemia    Stroke (HCC)    hx 3 strokes, left sided weakness   Uses walker    a couple times a day to move around   Wheelchair dependent     Family History  Problem Relation Age of Onset   Congestive Heart Failure Mother 66   Heart attack Father 54   Congestive Heart Failure Sister    Colon cancer Neg Hx    Colon polyps Neg  Hx     Past Surgical History:  Procedure Laterality Date   BIOPSY  09/02/2021   Procedure: BIOPSY;  Surgeon: Vinetta Greening, DO;  [facility];  Service: Endoscopy;;   CARDIAC CATHETERIZATION     CAROTID ENDARTERECTOMY     COLONOSCOPY WITH PROPOFOL  N/A 09/02/2021   Procedure: COLONOSCOPY WITH PROPOFOL ;  Surgeon: Vinetta Greening, DO;  [facility];  Service: Endoscopy;  Laterality: N/A;  11:45am   ESOPHAGOGASTRODUODENOSCOPY (EGD) WITH PROPOFOL  N/A 09/02/2021   Procedure: ESOPHAGOGASTRODUODENOSCOPY (EGD) WITH PROPOFOL ;  Surgeon: Vinetta Greening, DO;  [facility];  Service: Endoscopy;  Laterality: N/A;   POLYPECTOMY  09/02/2021   Procedure: POLYPECTOMY;  Surgeon: Vinetta Greening, DO;  [facility];  Service: Endoscopy;;   TOOTH EXTRACTION N/A 08/31/2022   Procedure: DENTAL RESTORATION/EXTRACTIONS;  Surgeon: Ascencion Lava, DMD;  Location: [facility];  Service: Oral Surgery;  Laterality: N/A;   TOTAL HIP ARTHROPLASTY Right 09/18/2023   Procedure: RIGHT TOTAL HIP ARTHROPLASTY-POSTERIOR;  Surgeon: Wes Hamman, MD;  Location: [facility];  Service: Orthopedics;  Laterality: Right;  3-C   Social History   Occupational History   Not on file  Tobacco Use   Smoking status: Every Day    Current packs/day: 0.25    Average packs/day: 0.3 packs/day for 40.0 years (10.0 ttl pk-yrs)    Types: Cigarettes   Smokeless tobacco: Never   Tobacco comments:    down to 5 cigarettes/day from 1.5 PPD  Vaping Use   Vaping status: Never Used  Substance and Sexual Activity   Alcohol use: Not Currently    Alcohol/week: 12.0 standard drinks of alcohol    Types: 12 Standard drinks or equivalent per week   Drug use: No   Sexual activity: Not on file

## 2023-12-12 ENCOUNTER — Ambulatory Visit (INDEPENDENT_AMBULATORY_CARE_PROVIDER_SITE_OTHER): Admitting: Physician Assistant

## 2023-12-12 ENCOUNTER — Encounter: Payer: Self-pay | Admitting: Physician Assistant

## 2023-12-12 DIAGNOSIS — Z96641 Presence of right artificial hip joint: Secondary | ICD-10-CM

## 2023-12-12 NOTE — Progress Notes (Signed)
 Post-Op Visit Note   Patient: George Valdez           Date of Birth: 12-Oct-1955           MRN: [id number] Visit Date: 12/12/2023 PCP: Patient, No Pcp Per   Assessment & Plan:  Chief Complaint:  Chief Complaint  Patient presents with   Right Hip - Follow-up    Right total hip arthroplasty 09/18/2023   Visit Diagnoses:  1. 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  He permanently lives at [facility].  He is doing well there.  He is getting physical therapy at the facility.  He is here today in a wheelchair but does note they have been getting him up with a rollator.  Overall, feeling great.  Examination of his right hip reveals painless hip flexion and logroll.  At this point, he will continue to work with PT.  He will follow-up with us  in 3 months for repeat evaluation and [facility] pelvis x-rays.  Call with concerns or questions.  Follow-Up Instructions: Return in about 3 months (around 03/13/2024).   Orders:  No orders of the defined types were placed in this encounter.  No orders of the defined types were placed in this encounter.   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 coli UTI (urinary tract infection) 07/12/2021   Rhabdomyolysis 07/10/2021   Generalized weakness 07/10/2021   Failure to thrive in adult 07/10/2021    Physical deconditioning 07/10/2021   Transaminitis 07/10/2021   Hypomagnesemia 07/10/2021   Leukocytosis 07/10/2021   Dehydration 07/10/2021   Mixed hyperlipidemia 07/10/2021   History of stroke 07/10/2021   Altered mental status 08/22/2016   Aspiration pneumonia (HCC) 08/22/2016   UTI (urinary tract infection) 08/22/2016   Protein-calorie malnutrition, severe 07/30/2016   Falls 07/22/2016   Weakness of left side of body 07/22/2016   h/o CVA (cerebral vascular accident) (HCC) with left sided weakness 07/22/2016   Alcohol abuse 07/22/2016   Hypotension 07/22/2016   Frequent falls    Anemia 11/30/2015   Hypokalemia 11/30/2015   AKI (acute kidney injury) (HCC) 11/30/2015   Acute encephalopathy 09/10/2014   Alcohol withdrawal delirium (HCC) 09/10/2014   Acute kidney injury superimposed on CKD (HCC) 09/10/2014   Sinus bradycardia 09/10/2014   Essential hypertension 09/10/2014   CAD (coronary artery disease) 09/10/2014   PAD (peripheral artery disease) (HCC) 09/10/2014   Pernicious anemia 09/10/2014   Past Medical History:  Diagnosis Date   Alcohol abuse    Anxiety    Asthma    BPH (benign prostatic hyperplasia)    Chronic pain    Constipation    COPD (chronic obstructive pulmonary disease) (HCC)    Coronary artery disease    w/ stents   Depression    Diabetes mellitus without complication (HCC)    type 2 no meds,  diet controlled   Frequent falls    Hyperlipemia    Hypertension    Myocardial infarction (HCC)    Neuropathy    Pernicious anemia    Stroke (HCC)    hx 3 strokes, left sided weakness   Uses walker    a couple times a day to move around   Wheelchair dependent     Family History  Problem Relation Age of Onset   Congestive Heart Failure Mother 16   Heart attack Father 61   Congestive Heart Failure Sister    Colon cancer Neg Hx    Colon polyps Neg Hx     Past Surgical History:  Procedure Laterality Date   BIOPSY  09/02/2021   Procedure: BIOPSY;   Surgeon: Vinetta Greening, DO;  [facility];  Service: Endoscopy;;   CARDIAC CATHETERIZATION     CAROTID ENDARTERECTOMY     COLONOSCOPY WITH PROPOFOL  N/A 09/02/2021   Procedure: COLONOSCOPY WITH PROPOFOL ;  Surgeon: Vinetta Greening, DO;  [facility];  Service: Endoscopy;  Laterality: N/A;  11:45am   ESOPHAGOGASTRODUODENOSCOPY (EGD) WITH PROPOFOL  N/A 09/02/2021   Procedure: ESOPHAGOGASTRODUODENOSCOPY (EGD) WITH PROPOFOL ;  Surgeon: Vinetta Greening, DO;  [facility];  Service: Endoscopy;  Laterality: N/A;   POLYPECTOMY  09/02/2021   Procedure: POLYPECTOMY;  Surgeon: Vinetta Greening, DO;  [facility];  Service: Endoscopy;;   TOOTH EXTRACTION N/A 08/31/2022   Procedure: DENTAL RESTORATION/EXTRACTIONS;  Surgeon: Ascencion Lava, DMD;  Location: [facility];  Service: Oral Surgery;  Laterality: N/A;   TOTAL HIP ARTHROPLASTY Right 09/18/2023   Procedure: RIGHT TOTAL HIP ARTHROPLASTY-POSTERIOR;  Surgeon: Wes Hamman, MD;  Location: [facility];  Service: Orthopedics;  Laterality: Right;  3-C   Social History   Occupational History   Not on file  Tobacco Use   Smoking status: Every Day    Current packs/day: 0.25    Average packs/day: 0.3 packs/day for 40.0 years (10.0 ttl pk-yrs)    Types: Cigarettes   Smokeless tobacco: Never   Tobacco comments:    down to 5 cigarettes/day from 1.5 PPD  Vaping Use   Vaping status: Never Used  Substance and Sexual Activity   Alcohol use: Not Currently    Alcohol/week: 12.0 standard drinks of alcohol    Types: 12 Standard drinks or equivalent per week   Drug use: No   Sexual activity: Not on file

## 2024-01-08 ENCOUNTER — Ambulatory Visit: Attending: Internal Medicine | Admitting: Internal Medicine

## 2024-01-08 VITALS — BP 130/74 | HR 80 | Ht 63.0 in | Wt 154.0 lb

## 2024-01-08 DIAGNOSIS — I2510 Atherosclerotic heart disease of native coronary artery without angina pectoris: Secondary | ICD-10-CM | POA: Insufficient documentation

## 2024-01-08 DIAGNOSIS — I159 Secondary hypertension, unspecified: Secondary | ICD-10-CM | POA: Insufficient documentation

## 2024-01-08 DIAGNOSIS — R0609 Other forms of dyspnea: Secondary | ICD-10-CM | POA: Diagnosis not present

## 2024-01-08 DIAGNOSIS — I771 Stricture of artery: Secondary | ICD-10-CM | POA: Insufficient documentation

## 2024-01-08 DIAGNOSIS — I2583 Coronary atherosclerosis due to lipid rich plaque: Secondary | ICD-10-CM | POA: Diagnosis not present

## 2024-01-08 NOTE — Progress Notes (Signed)
 Cardiology Office Note  Date: 01/08/2024   ID: KEMPTON MILNE, DOB Aug 03, 1955, MRN [id number]  PCP:  Patient, No Pcp Per  Cardiologist:  Diannah SHAUNNA Maywood, MD Electrophysiologist:  None   Reason for Office Visit: Follow-up of CAD and PAD   History of Present Illness: George Valdez is a 68 y.o. male known to have carotid artery stents, history of three strokes, lower extremity stents follows up with vascular surgery, CAD (45% stenosis in LCx per prior documentation) presented to cardiology clinic for follow-up visit.   Patient is wheelchair-bound due to prior history of strokes.  However, he does get up and walk multiple times throughout the day with a walker but he was told recently that he had a hip fracture and hence not able to walk.  He underwent hip replacement surgery a few months ago after which he is able to walk with a walker with no pain.  However he started to have new onset of DOE and oxygen  desaturations with ambulation which his primary care at the nursing facility is aware.  No oxygen  saturation at rest.  He lives in a nursing facility.  Current smoker.  Does not have any symptoms of angina.  No exertional dizziness, syncope or leg swelling.  Past Medical History:  Diagnosis Date   Alcohol abuse    Anxiety    Asthma    BPH (benign prostatic hyperplasia)    Chronic pain    Constipation    COPD (chronic obstructive pulmonary disease) (HCC)    Coronary artery disease    w/ stents   Depression    Diabetes mellitus without complication (HCC)    type 2 no meds, diet controlled   Frequent falls    Hyperlipemia    Hypertension    Myocardial infarction (HCC)    Neuropathy    Pernicious anemia    Stroke (HCC)    hx 3 strokes, left sided weakness   Uses walker    a couple times a day to move around   Wheelchair dependent     Past Surgical History:  Procedure Laterality Date   BIOPSY  09/02/2021   Procedure: BIOPSY;  Surgeon: Cindie Carlin POUR,  DO;  [facility];  Service: Endoscopy;;   CARDIAC CATHETERIZATION     CAROTID ENDARTERECTOMY     COLONOSCOPY WITH PROPOFOL  N/A 09/02/2021   Procedure: COLONOSCOPY WITH PROPOFOL ;  Surgeon: Cindie Carlin POUR, DO;  [facility];  Service: Endoscopy;  Laterality: N/A;  11:45am   ESOPHAGOGASTRODUODENOSCOPY (EGD) WITH PROPOFOL  N/A 09/02/2021   Procedure: ESOPHAGOGASTRODUODENOSCOPY (EGD) WITH PROPOFOL ;  Surgeon: Cindie Carlin POUR, DO;  [facility];  Service: Endoscopy;  Laterality: N/A;   POLYPECTOMY  09/02/2021   Procedure: POLYPECTOMY;  Surgeon: Cindie Carlin POUR, DO;  [facility];  Service: Endoscopy;;   TOOTH EXTRACTION N/A 08/31/2022   Procedure: DENTAL RESTORATION/EXTRACTIONS;  Surgeon: Sheryle Hamilton, DMD;  [facility];  Service: Oral Surgery;  Laterality: N/A;   TOTAL HIP ARTHROPLASTY Right 09/18/2023   Procedure: RIGHT TOTAL HIP ARTHROPLASTY-POSTERIOR;  Surgeon: Jerri Kay HERO, MD;  Location: [facility];  Service: Orthopedics;  Laterality: Right;  3-C    Current Outpatient Medications  Medication Sig Dispense Refill   amLODipine  (NORVASC ) 5 MG tablet Take 1 tablet (5 mg total) by mouth daily. 90 tablet 2   aspirin  (ASPIRIN  81) 81 MG chewable tablet Chew 1 tablet (81 mg total) by mouth daily. Take one aspirin  81 mg daily with plavix   for 6 weeks post-op 42 tablet 0   clopidogrel  (PLAVIX ) 75 MG tablet Take 1 tablet (75 mg total) by mouth daily. (Patient taking differently: Take 75 mg by mouth every evening.)     cyanocobalamin  (,VITAMIN B-12,) 1000 MCG/ML injection Inject 1,000 mcg into the muscle every 30 (thirty) days.     diphenhydrAMINE  (BENADRYL ) 25 MG tablet Take 50 mg by mouth every 4 (four) hours as needed for itching.     docusate sodium  (COLACE) 100 MG capsule Take 1 capsule (100 mg total) by mouth daily as needed. 30 capsule 2   doxycycline  (VIBRA -TABS) 100 MG tablet Take 1 tablet (100 mg total) by mouth 2 (two) times daily. To be taken after  surgery 20 tablet 0   DULoxetine  (CYMBALTA ) 20 MG capsule Take 40 mg by mouth daily.     ezetimibe  (ZETIA ) 10 MG tablet Take 1 tablet (10 mg total) by mouth daily. (Patient taking differently: Take 10 mg by mouth at bedtime.) 90 tablet 3   famotidine (PEPCID) 20 MG tablet Take 20 mg by mouth daily.     HYDROcodone -acetaminophen  (NORCO) 7.5-325 MG tablet Take by mouth.     Magnesium  400 MG TABS Take 400 mg by mouth at bedtime.     Melatonin 3 MG CAPS Take 6 mg by mouth at bedtime.     methocarbamol  (ROBAXIN -750) 750 MG tablet Take 1 tablet (750 mg total) by mouth 3 (three) times daily as needed for muscle spasms. 30 tablet 1   Multiple Vitamin (MULTIVITAMIN WITH MINERALS) TABS tablet Take 1 tablet by mouth daily.     ondansetron  (ZOFRAN ) 4 MG tablet Take 1 tablet (4 mg total) by mouth every 8 (eight) hours as needed for nausea or vomiting. 40 tablet 0   oxyCODONE -acetaminophen  (PERCOCET) 5-325 MG tablet Take 1-2 tablets by mouth every 6 (six) hours as needed. To be taken after surgery 40 tablet 0   pantoprazole  (PROTONIX ) 40 MG tablet Take 1 tablet (40 mg total) by mouth daily. 30 tablet 3   potassium chloride  20 MEQ TBCR Take 20 mEq by mouth daily. 30 tablet 0   pregabalin  (LYRICA ) 50 MG capsule Take 50 mg by mouth 3 (three) times daily.     rosuvastatin  (CRESTOR ) 40 MG tablet Take 40 mg by mouth every evening. (2100)     thiamine  100 MG tablet Take 1 tablet (100 mg total) by mouth daily. 30 tablet 0   tiotropium (SPIRIVA ) 18 MCG inhalation capsule Place 18 mcg into inhaler and inhale daily.     tiZANidine (ZANAFLEX) 2 MG tablet Take 2 mg by mouth at bedtime.     traZODone (DESYREL) 100 MG tablet Take 100 mg by mouth at bedtime.     vitamin E 400 UNIT capsule Take 400 Units by mouth daily.     No current facility-administered medications for this visit.   Allergies:  Codeine   Social History: The patient  reports that he has been smoking cigarettes. He has a 10 pack-year smoking history. He  has never used smokeless tobacco. He reports that he does not currently use alcohol after a past usage of about 12.0 standard drinks of alcohol per week. He reports that he does not use drugs.   Family History: The patient's family history includes Congestive Heart Failure in his sister; Congestive Heart Failure (age of onset: 47) in his mother; Heart attack (age of onset: 55) in his father.   ROS:  Please see the history of present illness. Otherwise, complete review of  systems is positive for none.  All other systems are reviewed and negative.   Physical Exam: VS:  BP 130/74 (BP Location: Right Arm, Patient Position: Sitting)   Pulse 80   Ht 5' 3 (1.6 m)   Wt 154 lb (69.9 kg)   SpO2 90%   BMI 27.28 kg/m , BMI Body mass index is 27.28 kg/m.  Wt Readings from Last 3 Encounters:  01/08/24 154 lb (69.9 kg)  09/18/23 153 lb (69.4 kg)  04/11/23 147 lb (66.7 kg)    General: Patient appears comfortable at rest. HEENT: Conjunctiva and lids normal, oropharynx clear with moist mucosa. Neck: Supple, no elevated JVP or carotid bruits, no thyromegaly. Lungs: Clear to auscultation, nonlabored breathing at rest. Cardiac: Regular rate and rhythm, no S3 or significant systolic murmur, no pericardial rub. Abdomen: Soft, nontender, no hepatomegaly, bowel sounds present, no guarding or rebound. Extremities: No pitting edema, 1+ right radial pulse and 2+ left radial pulse Skin: Warm and dry. Musculoskeletal: No kyphosis. Neuropsychiatric: Alert and oriented x3  ECG:    Recent Labwork: 09/18/2023: BUN 12; Creatinine, Ser 1.25; Hemoglobin 15.1; Platelets 222; Potassium 4.5; Sodium 138  No results found for: CHOL, TRIG, HDL, CHOLHDL, VLDL, LDLCALC, LDLDIRECT   Assessment and Plan: \ # DOE - Started since hip surgery.  Will update echocardiogram.  If echocardiogram is normal, DOE could likely be secondary to deconditioning or lung pathology.  # PAD of the right upper extremity -  Asymptomatic.  No exertional dizziness, weakness or numbness in the right upper extremity.  There was imaging evidence of ostial subclavian artery stenosis (versus, could be normal artery stenosis) on the right upper extremity in in July 2024.  Will update the ultrasound arterial Doppler of right upper extremity.  Continue cardioprotective medications, DAPT and high intensity statin in addition to Zetia .  # Carotid artery stents # Peripheral vascular stents in the left legs # Multiple strokes currently wheelchair-bound but walks with a walker at home # CAD (45% LCx disease in the past) - No angina or DOE.  No exertional dizziness.  No severe leg symptoms the last 1 year. - Continue DAPT, aspirin  81 mg once daily and Plavix  75 mg once daily. - Continue rosuvastatin  40 mg nightly and Zetia  10 mg nightly.  # HLD, unknown values - Continue rosuvastatin  40 mg nightly, Zetia  10 mg nightly.  Obtain lipid panel.  # HTN, controlled - Continue amlodipine  5 mg once daily.  Blood pressures better controlled after switching metoprolol  to amlodipine .  # Mild MR in 1/24 - Asymptomatic, no need to repeat echocardiogram.  # Nicotine abuse - Counseling.  Medication Adjustments/Labs and Tests Ordered: Current medicines are reviewed at length with the patient today.  Concerns regarding medicines are outlined above.   Tests Ordered: Orders Placed This Encounter  Procedures   EKG 12-Lead   Orders Placed This Encounter  Procedures   EKG 12-Lead    Medication Changes: No orders of the defined types were placed in this encounter.    Disposition:  Follow up 1 year  Signed, Destinie Thornsberry Arleta Maywood, MD, 01/08/2024 8:47 AM    [facility] [address], [place], [place] [address]

## 2024-01-08 NOTE — Patient Instructions (Addendum)
 Medication Instructions:   Continue all current medications.   Labwork:  none  Testing/Procedures:  Your physician has requested that you have an echocardiogram. Echocardiography is a painless test that uses sound waves to create images of your heart. It provides your doctor with information about the size and shape of your heart and how well your heart's chambers and valves are working. This procedure takes approximately one hour. There are no restrictions for this procedure. Please do NOT wear cologne, perfume, aftershave, or lotions (deodorant is allowed). Please arrive 15 minutes prior to your appointment time.  Please note: We ask at that you not bring children with you during ultrasound (echo/ vascular) testing. Due to room size and safety concerns, children are not allowed in the ultrasound rooms during exams. Our front office staff cannot provide observation of children in our lobby area while testing is being conducted. An adult accompanying a patient to their appointment will only be allowed in the ultrasound room at the discretion of the ultrasound technician under special circumstances. We apologize for any inconvenience. Your physician has requested that you have a lower or upper extremity arterial duplex. This test is an ultrasound of the arteries in the legs or arms. It looks at arterial blood flow in the legs and arms. Allow one hour for Lower and Upper Arterial scans. There are no restrictions or special instructions.  Please note: We ask at that you not bring children with you during ultrasound (echo/ vascular) testing. Due to room size and safety concerns, children are not allowed in the ultrasound rooms during exams. Our front office staff cannot provide observation of children in our lobby area while testing is being conducted. An adult accompanying a patient to their appointment will only be allowed in the ultrasound room at the discretion of the ultrasound technician under  special circumstances. We apologize for any inconvenience. Office will contact with results via phone, letter or mychart.    Follow-Up:  Your physician wants you to follow up in:  1 year.  You should receive a recall letter in the mail about 2 months prior to the time you are due.  If you don't receive this, please call our office to schedule your follow up appointment.      Any Other Special Instructions Will Be Listed Below (If Applicable).   If you need a refill on your cardiac medications before your next appointment, please call your pharmacy.

## 2024-01-23 ENCOUNTER — Other Ambulatory Visit

## 2024-01-25 ENCOUNTER — Other Ambulatory Visit

## 2024-02-15 ENCOUNTER — Ambulatory Visit

## 2024-02-15 ENCOUNTER — Ambulatory Visit: Attending: Internal Medicine

## 2024-02-15 DIAGNOSIS — I771 Stricture of artery: Secondary | ICD-10-CM

## 2024-02-15 DIAGNOSIS — R0609 Other forms of dyspnea: Secondary | ICD-10-CM | POA: Diagnosis present

## 2024-02-16 LAB — ECHOCARDIOGRAM COMPLETE
AR max vel: 1.44 cm2
AV Area VTI: 1.49 cm2
AV Area mean vel: 1.28 cm2
AV Mean grad: 7 mmHg
AV Peak grad: 12 mmHg
Ao pk vel: 1.73 m/s
Area-P 1/2: 3.2 cm2
Calc EF: 30.4 %
Est EF: 40
MV VTI: 2.04 cm2
S' Lateral: 3.7 cm
Single Plane A2C EF: 21.2 %
Single Plane A4C EF: 40.4 %

## 2024-02-23 ENCOUNTER — Ambulatory Visit: Payer: Self-pay | Admitting: Internal Medicine

## 2024-03-01 MED ORDER — FUROSEMIDE 40 MG PO TABS: 40.0000 mg | ORAL_TABLET | Freq: Every day | ORAL | 3 refills | Status: AC

## 2024-03-01 NOTE — Telephone Encounter (Signed)
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  LV and RV function reduced compared to prior echocardiogram.  Aortic stenosis is new.  DOE likely related to heart failure.  Start p.o. Lasix  40 mg once daily.  Obtain BMP in 5 days.  Schedule  follow-up in 1 month. ----- Message ----- From: Interface, [employer] Sent: 02/16/2024   9:51 AM EDT To: Vishnu P Mallipeddi, MD

## 2024-03-01 NOTE — Telephone Encounter (Signed)
 The patient has been notified of the result and verbalized understanding.  All questions (if any) were answered. Spoke with [place] at [facility] she stated that she will have their lab to come in within a week to have BMP completed. Advised her that our schedulers will reach out to have him scheduled to see Dr. Mallipeddi in a week   Littie CHRISTELLA Croak, CMA 03/01/2024 4:30 PM

## 2024-03-13 ENCOUNTER — Ambulatory Visit: Admitting: Physician Assistant

## 2024-03-13 ENCOUNTER — Other Ambulatory Visit (INDEPENDENT_AMBULATORY_CARE_PROVIDER_SITE_OTHER): Payer: Self-pay

## 2024-03-13 DIAGNOSIS — Z96641 Presence of right artificial hip joint: Secondary | ICD-10-CM | POA: Diagnosis not present

## 2024-03-13 NOTE — Progress Notes (Signed)
 Post-Op Visit Note   Patient: George Valdez           Date of Birth: [birth year]           MRN: [id number] Visit Date: 03/13/2024 PCP: Patient, No Pcp Per   Assessment & Plan:  Chief Complaint:  Chief Complaint  Patient presents with   Right Hip - Follow-up    Right THA 09/18/2023   Visit Diagnoses:  1. Status post total replacement of right hip     Plan: Patient is a pleasant 68 year old gentleman who comes in today 6 months status post right total hip replacement 09/18/2023.  He has been doing great.  No complaints.  He is still in a wheelchair as he has issues with equilibrium and neuropathy.  He does note that he stands for transfers.  Right hip exam: He has painless hip flexion and logroll.  At this point, he will continue to advance with activity as tolerated.  Dental prophylaxis reinforced.  Follow-up in 6 months for repeat evaluation and [facility] pelvis x-rays.  Call with concerns or questions.  Follow-Up Instructions: Return in about 6 months (around 09/10/2024).   Orders:  Orders Placed This Encounter  Procedures   XR Pelvis 1-2 Views   No orders of the defined types were placed in this encounter.   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 coli UTI (urinary tract infection) 07/12/2021   Rhabdomyolysis 07/10/2021   Generalized weakness 07/10/2021   Failure to thrive in adult 07/10/2021    Physical deconditioning 07/10/2021   Transaminitis 07/10/2021   Hypomagnesemia 07/10/2021   Leukocytosis 07/10/2021   Dehydration 07/10/2021   Mixed hyperlipidemia 07/10/2021   History of stroke 07/10/2021   Altered mental status 08/22/2016   Aspiration pneumonia (HCC) 08/22/2016   UTI (urinary tract infection) 08/22/2016   Protein-calorie malnutrition, severe 07/30/2016   Falls 07/22/2016   Weakness of left side of body 07/22/2016   h/o CVA (cerebral vascular accident) (HCC) with left sided weakness 07/22/2016   Alcohol abuse 07/22/2016   Hypotension 07/22/2016   Frequent falls    Anemia 11/30/2015   Hypokalemia 11/30/2015   AKI (acute kidney injury) (HCC) 11/30/2015   Acute encephalopathy 09/10/2014   Alcohol withdrawal delirium (HCC) 09/10/2014   Acute kidney injury superimposed on CKD (HCC) 09/10/2014   Sinus bradycardia 09/10/2014   Essential hypertension 09/10/2014   CAD (coronary artery disease) 09/10/2014   PAD (peripheral artery disease) (HCC) 09/10/2014   Pernicious anemia 09/10/2014   Past Medical History:  Diagnosis Date   Alcohol abuse    Anxiety    Asthma    BPH (benign prostatic hyperplasia)    Chronic pain    Constipation    COPD (chronic obstructive pulmonary disease) (HCC)    Coronary artery disease    w/ stents   Depression    Diabetes mellitus without complication (HCC)    type 2 no meds, diet controlled  Frequent falls    Hyperlipemia    Hypertension    Myocardial infarction (HCC)    Neuropathy    Pernicious anemia    Stroke (HCC)    hx 3 strokes, left sided weakness   Uses walker    a couple times a day to move around   Wheelchair dependent     Family History  Problem Relation Age of Onset   Congestive Heart Failure Mother 59   Heart attack Father 2   Congestive Heart Failure Sister    Colon cancer Neg Hx    Colon polyps Neg Hx     Past Surgical History:  Procedure Laterality Date   BIOPSY  09/02/2021   Procedure: BIOPSY;   Surgeon: Cindie Carlin POUR, DO;  [facility];  Service: Endoscopy;;   CARDIAC CATHETERIZATION     CAROTID ENDARTERECTOMY     COLONOSCOPY WITH PROPOFOL  N/A 09/02/2021   Procedure: COLONOSCOPY WITH PROPOFOL ;  Surgeon: Cindie Carlin POUR, DO;  [facility];  Service: Endoscopy;  Laterality: N/A;  11:45am   ESOPHAGOGASTRODUODENOSCOPY (EGD) WITH PROPOFOL  N/A 09/02/2021   Procedure: ESOPHAGOGASTRODUODENOSCOPY (EGD) WITH PROPOFOL ;  Surgeon: Cindie Carlin POUR, DO;  [facility];  Service: Endoscopy;  Laterality: N/A;   POLYPECTOMY  09/02/2021   Procedure: POLYPECTOMY;  Surgeon: Cindie Carlin POUR, DO;  [facility];  Service: Endoscopy;;   TOOTH EXTRACTION N/A 08/31/2022   Procedure: DENTAL RESTORATION/EXTRACTIONS;  Surgeon: Sheryle Hamilton, DMD;  Location: [facility];  Service: Oral Surgery;  Laterality: N/A;   TOTAL HIP ARTHROPLASTY Right 09/18/2023   Procedure: RIGHT TOTAL HIP ARTHROPLASTY-POSTERIOR;  Surgeon: Jerri Kay HERO, MD;  Location: [facility];  Service: Orthopedics;  Laterality: Right;  3-C   Social History   Occupational History   Not on file  Tobacco Use   Smoking status: Every Day    Current packs/day: 0.25    Average packs/day: 0.3 packs/day for 40.0 years (10.0 ttl pk-yrs)    Types: Cigarettes   Smokeless tobacco: Never   Tobacco comments:    down to 5 cigarettes/day from 1.5 PPD  Vaping Use   Vaping status: Never Used  Substance and Sexual Activity   Alcohol use: Not Currently    Alcohol/week: 12.0 standard drinks of alcohol    Types: 12 Standard drinks or equivalent per week   Drug use: No   Sexual activity: Not on file

## 2024-04-29 ENCOUNTER — Ambulatory Visit: Attending: Internal Medicine | Admitting: Internal Medicine

## 2024-04-29 ENCOUNTER — Telehealth: Payer: Self-pay

## 2024-04-29 ENCOUNTER — Encounter: Payer: Self-pay | Admitting: Internal Medicine

## 2024-04-29 VITALS — BP 116/74 | HR 53 | Ht 63.0 in | Wt 154.0 lb

## 2024-04-29 DIAGNOSIS — N179 Acute kidney failure, unspecified: Secondary | ICD-10-CM | POA: Diagnosis present

## 2024-04-29 DIAGNOSIS — I771 Stricture of artery: Secondary | ICD-10-CM | POA: Insufficient documentation

## 2024-04-29 DIAGNOSIS — E782 Mixed hyperlipidemia: Secondary | ICD-10-CM | POA: Insufficient documentation

## 2024-04-29 DIAGNOSIS — I5042 Chronic combined systolic (congestive) and diastolic (congestive) heart failure: Secondary | ICD-10-CM | POA: Insufficient documentation

## 2024-04-29 DIAGNOSIS — I2699 Other pulmonary embolism without acute cor pulmonale: Secondary | ICD-10-CM

## 2024-04-29 DIAGNOSIS — I429 Cardiomyopathy, unspecified: Secondary | ICD-10-CM | POA: Diagnosis present

## 2024-04-29 NOTE — Patient Instructions (Addendum)
 Medication Instructions:  Your physician recommends that you continue on your current medications as directed. Please refer to the Current Medication list given to you today.   Labwork: BMET to be completed today at [facility] [place]  Testing/Procedures: .   Please report to Radiology at the [facility] Main Entrance 30 minutes early for your test.  [address] [place], [place] [address]                       How to Prepare for Your Cardiac PET/CT Stress Test:  Nothing to eat or drink, except water , 3 hours prior to arrival time.  NO caffeine/decaffeinated products, or chocolate 12 hours prior to arrival. (Please note decaffeinated beverages (teas/coffees) still contain caffeine).  If you have caffeine within 12 hours prior, the test will need to be rescheduled.  Medication instructions: Do not take erectile dysfunction medications for 72 hours prior to test (sildenafil, tadalafil) Do not take nitrates (isosorbide  mononitrate, Ranexa) the day before or day of test Do not take tamsulosin  the day before or morning of test Hold theophylline containing medications for 12 hours. Hold Dipyridamole 48 hours prior to the test.  Diabetic Preparation: If able to eat breakfast prior to 3 hour fasting, you may take all medications, including your insulin . Do not worry if you miss your breakfast dose of insulin  - start at your next meal. If you do not eat prior to 3 hour fast-Hold all diabetes (oral and insulin ) medications. Patients who wear a continuous glucose monitor MUST remove the device prior to scanning.  You may take your remaining medications with water .  NO perfume, cologne or lotion on chest or abdomen area. FEMALES - Please avoid wearing dresses to this appointment.  Total time is 1 to 2 hours; you may want to bring reading material for the waiting time.  IF YOU THINK YOU MAY BE PREGNANT, OR ARE NURSING PLEASE INFORM THE TECHNOLOGIST.  In preparation for  your appointment, medication and supplies will be purchased.  Appointment availability is limited, so if you need to cancel or reschedule, please call the Radiology Department Scheduler at [phone] 24 hours in advance to avoid a cancellation fee of $100.00  What to Expect When you Arrive:  Once you arrive and check in for your appointment, you will be taken to a preparation room within the Radiology Department.  A technologist or Nurse will obtain your medical history, verify that you are correctly prepped for the exam, and explain the procedure.  Afterwards, an IV will be started in your arm and electrodes will be placed on your skin for EKG monitoring during the stress portion of the exam. Then you will be escorted to the PET/CT scanner.  There, staff will get you positioned on the scanner and obtain a blood pressure and EKG.  During the exam, you will continue to be connected to the EKG and blood pressure machines.  A small, safe amount of a radioactive tracer will be injected in your IV to obtain a series of pictures of your heart along with an injection of a stress agent.    After your Exam:  It is recommended that you eat a meal and drink a caffeinated beverage to counter act any effects of the stress agent.  Drink plenty of fluids for the remainder of the day and urinate frequently for the first couple of hours after the exam.  Your doctor will inform you of your test results within 7-10  business days.  For more information and frequently asked questions, please visit our website: [email or web address]  For questions about your test or how to prepare for your test, please call: Cardiac Imaging Nurse Navigators Office: [phone]   Follow-Up: Your physician recommends that you schedule a follow-up appointment in: 3 months  Any Other Special Instructions Will Be Listed Below (If Applicable). Thank you for choosing [facility]!     If you need a refill on your  cardiac medications before your next appointment, please call your pharmacy.

## 2024-04-29 NOTE — Progress Notes (Signed)
 Cardiology Office Note  Date: 04/29/2024   ID: George Valdez, DOB [birth year], MRN [id number]  PCP:  Patient, No Pcp Per  Cardiologist:  Diannah SHAUNNA Maywood, MD Electrophysiologist:  None   Reason for Office Visit: Follow-up of CAD and PAD   History of Present Illness: George Valdez is a 68 y.o. male known to have carotid artery stents, history of three strokes, lower extremity stents follows up with vascular surgery, CAD (45% stenosis in LCx per prior documentation) presented to cardiology clinic for follow-up visit.   Patient is wheelchair-bound due to prior history of strokes.  However, he does get up and walk multiple times throughout the day with a walker.  Underwent right hip replacement a couple of months ago and had no complications postprocedure.  Due to new DOE, echocardiogram was obtained in August 2025 that showed LVEF 40%.  Interventricular septum flattened in systole and diastole consistent with RV pressure and volume overload.  RV function is moderately reduced, RV size is moderately to severely enlarged and moderate pulmonary hypertension.  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  Patient is today here for follow-up visit.  Denied having DOE but visibly a little bit tachypneic during my interview.  No angina, dizziness, syncope, palpitations, leg swelling.  He reported having claudication in his right upper extremity for the last 2 to 3 years but thought this could be from his rotator cuff and he did not seek medical attention.  He also reported that his right arm feels tired after he pushes his wheelchair for some time.  Past Medical History:  Diagnosis Date   Alcohol abuse    Anxiety    Asthma    BPH (benign prostatic hyperplasia)    Chronic pain    Constipation    COPD (chronic obstructive pulmonary disease) (HCC)    Coronary artery disease    w/  stents   Depression    Diabetes mellitus without complication (HCC)    type 2 no meds, diet controlled   Frequent falls    Hyperlipemia    Hypertension    Myocardial infarction (HCC)    Neuropathy    Pernicious anemia    Stroke (HCC)    hx 3 strokes, left sided weakness   Uses walker    a couple times a day to move around   Wheelchair dependent     Past Surgical History:  Procedure Laterality Date   BIOPSY  09/02/2021   Procedure: BIOPSY;  Surgeon: Cindie Carlin POUR, DO;  [facility];  Service: Endoscopy;;   CARDIAC CATHETERIZATION     CAROTID ENDARTERECTOMY     COLONOSCOPY WITH PROPOFOL  N/A 09/02/2021   Procedure: COLONOSCOPY WITH PROPOFOL ;  Surgeon: Cindie Carlin POUR, DO;  [facility];  Service: Endoscopy;  Laterality: N/A;  11:45am   ESOPHAGOGASTRODUODENOSCOPY (EGD) WITH PROPOFOL  N/A 09/02/2021   Procedure: ESOPHAGOGASTRODUODENOSCOPY (EGD) WITH PROPOFOL ;  Surgeon: Cindie Carlin POUR, DO;  [facility];  Service: Endoscopy;  Laterality: N/A;   POLYPECTOMY  09/02/2021   Procedure: POLYPECTOMY;  Surgeon: Cindie Carlin POUR, DO;  [facility];  Service: Endoscopy;;   TOOTH EXTRACTION N/A 08/31/2022   Procedure: DENTAL RESTORATION/EXTRACTIONS;  Surgeon: Sheryle Hamilton, DMD;  Location: [facility];  Service: Oral Surgery;  Laterality: N/A;   TOTAL HIP ARTHROPLASTY Right 09/18/2023   Procedure: RIGHT TOTAL HIP ARTHROPLASTY-POSTERIOR;  Surgeon: Jerri,  Kay HERO, MD;  Location: [facility];  Service: Orthopedics;  Laterality: Right;  3-C    Current Outpatient Medications  Medication Sig Dispense Refill   amLODipine  (NORVASC ) 5 MG tablet Take 1 tablet (5 mg total) by mouth daily. 90 tablet 2   aspirin  (ASPIRIN  81) 81 MG chewable tablet Chew 1 tablet (81 mg total) by mouth daily. Take one aspirin  81 mg daily with plavix  for 6 weeks post-op 42 tablet 0   clopidogrel  (PLAVIX ) 75 MG tablet Take 1 tablet (75 mg total) by mouth daily. (Patient taking differently: Take 75 mg  by mouth every evening.)     cyanocobalamin  (,VITAMIN B-12,) 1000 MCG/ML injection Inject 1,000 mcg into the muscle every 30 (thirty) days.     diphenhydrAMINE  (BENADRYL ) 25 MG tablet Take 50 mg by mouth every 4 (four) hours as needed for itching.     docusate sodium  (COLACE) 100 MG capsule Take 1 capsule (100 mg total) by mouth daily as needed. 30 capsule 2   doxycycline  (VIBRA -TABS) 100 MG tablet Take 1 tablet (100 mg total) by mouth 2 (two) times daily. To be taken after surgery 20 tablet 0   DULoxetine  (CYMBALTA ) 20 MG capsule Take 40 mg by mouth daily.     ezetimibe  (ZETIA ) 10 MG tablet Take 1 tablet (10 mg total) by mouth daily. (Patient taking differently: Take 10 mg by mouth at bedtime.) 90 tablet 3   famotidine (PEPCID) 20 MG tablet Take 20 mg by mouth daily.     furosemide  (LASIX ) 40 MG tablet Take 1 tablet (40 mg total) by mouth daily. 90 tablet 3   HYDROcodone -acetaminophen  (NORCO) 7.5-325 MG tablet Take by mouth.     Magnesium  400 MG TABS Take 400 mg by mouth at bedtime.     Melatonin 3 MG CAPS Take 6 mg by mouth at bedtime.     methocarbamol  (ROBAXIN -750) 750 MG tablet Take 1 tablet (750 mg total) by mouth 3 (three) times daily as needed for muscle spasms. 30 tablet 1   Multiple Vitamin (MULTIVITAMIN WITH MINERALS) TABS tablet Take 1 tablet by mouth daily.     ondansetron  (ZOFRAN ) 4 MG tablet Take 1 tablet (4 mg total) by mouth every 8 (eight) hours as needed for nausea or vomiting. 40 tablet 0   oxyCODONE -acetaminophen  (PERCOCET) 5-325 MG tablet Take 1-2 tablets by mouth every 6 (six) hours as needed. To be taken after surgery 40 tablet 0   pantoprazole  (PROTONIX ) 40 MG tablet Take 1 tablet (40 mg total) by mouth daily. 30 tablet 3   potassium chloride  20 MEQ TBCR Take 20 mEq by mouth daily. 30 tablet 0   potassium chloride  SA (KLOR-CON  M) 20 MEQ tablet Take 20 mEq by mouth daily.     pregabalin  (LYRICA ) 50 MG capsule Take 50 mg by mouth 3 (three) times daily.     rosuvastatin   (CRESTOR ) 40 MG tablet Take 40 mg by mouth every evening. (2100)     SPIRIVA  HANDIHALER 18 MCG CAPS Irrigate with 1 capsule as directed daily.     thiamine  100 MG tablet Take 1 tablet (100 mg total) by mouth daily. 30 tablet 0   tiotropium (SPIRIVA ) 18 MCG inhalation capsule Place 18 mcg into inhaler and inhale daily.     tiZANidine (ZANAFLEX) 2 MG tablet Take 2 mg by mouth at bedtime.     traZODone (DESYREL) 100 MG tablet Take 100 mg by mouth at bedtime.     vitamin E 400 UNIT capsule Take 400 Units by  mouth daily.     No current facility-administered medications for this visit.   Allergies:  Codeine   Social History: The patient  reports that he has been smoking cigarettes. He has a 10 pack-year smoking history. He has never used smokeless tobacco. He reports that he does not currently use alcohol after a past usage of about 12.0 standard drinks of alcohol per week. He reports that he does not use drugs.   Family History: The patient's family history includes Congestive Heart Failure in his sister; Congestive Heart Failure (age of onset: 25) in his mother; Heart attack (age of onset: 50) in his father.   ROS:  Please see the history of present illness. Otherwise, complete review of systems is positive for none.  All other systems are reviewed and negative.   Physical Exam: VS:  BP 116/74   Pulse (!) 53   Ht 5' 3 (1.6 m)   Wt 154 lb (69.9 kg)   SpO2 92%   BMI 27.28 kg/m , BMI Body mass index is 27.28 kg/m.  Wt Readings from Last 3 Encounters:  04/29/24 154 lb (69.9 kg)  01/08/24 154 lb (69.9 kg)  09/18/23 153 lb (69.4 kg)    General: Patient appears comfortable at rest. HEENT: Conjunctiva and lids normal, oropharynx clear with moist mucosa. Neck: Supple, no elevated JVP or carotid bruits, no thyromegaly. Lungs: Clear to auscultation, nonlabored breathing at rest. Cardiac: Regular rate and rhythm, no S3 or significant systolic murmur, no pericardial rub. Abdomen: Soft,  nontender, no hepatomegaly, bowel sounds present, no guarding or rebound. Extremities: No pitting edema, absent right radial pulse and 2+ left radial pulse Skin: Warm and dry. Musculoskeletal: No kyphosis. Neuropsychiatric: Alert and oriented x3  ECG:    Recent Labwork: 09/18/2023: BUN 12; Creatinine, Ser 1.25; Hemoglobin 15.1; Platelets 222; Potassium 4.5; Sodium 138  No results found for: CHOL, TRIG, HDL, CHOLHDL, VLDL, LDLCALC, LDLDIRECT   Assessment and Plan:  # DOE - Started since hip surgery.  Echocardiogram in August 2025 showed LVEF 40%, moderate RV systolic dysfunction, moderate to severe RV enlargement, moderate pulmonary hypertension, mild MR, mild to moderate AS and CVP 3 mmHg.  He has CKD, obtain VQ scan.  # New onset cardiomyopathy (LVEF 40%, moderate RV systolic dysfunction) in August 2025 # Chronic systolic and diastolic heart failure - Patient had LCx 45% stenosis in the past.  Denies DOE but visibly mildly tachypneic during my interview.  No angina.  Obtaining VQ scan, as above.  Obtain NM PET/CT stress test for ischemic evaluation due to CKD.  Continue p.o. Lasix  40 mg once daily, obtain BMP today.  # Right subclavian artery stenosis, symptomatic - Reports that his right arm feels tired after he pushes the wheelchair for some time. - Absent right radial pulse (1+ a few months ago), 2+ left radial pulse. - Imaging showed evidence of right subclavian artery stenosis and right innominate artery was poorly visualized. - BP in R UE: 116 mmHg. BP in L UE: 168 mmHg in the past. - Urgent vascular surgery referral for revascularization. - Continue cardioprotective medications, DAPT, high intensity statin and Zetia .  # Carotid artery stents # Peripheral vascular stents in the left legs # Multiple strokes currently wheelchair-bound but walks with a walker at home - As above.  # HLD, unknown values - Continue rosuvastatin  40 mg nightly, Zetia  10 mg nightly.   Obtain lipid panel.  Goal LDL less than 55.  # HTN, controlled - Continue amlodipine  5 mg once  daily.  # Nicotine abuse - Counseling.  Currently smoking 10 cigarettes/day.   30-minutes spent in reviewing prior medical records, more than 3 labs, discussion and documentation.  Medication Adjustments/Labs and Tests Ordered: Current medicines are reviewed at length with the patient today.  Concerns regarding medicines are outlined above.   Tests Ordered: No orders of the defined types were placed in this encounter.  No orders of the defined types were placed in this encounter.   Medication Changes: No orders of the defined types were placed in this encounter.    Disposition:  Follow up 3 months  Signed, Branden Vine Arleta Maywood, MD, 04/29/2024 9:22 AM    [facility] [address], [place], [place] [address]

## 2024-04-29 NOTE — Addendum Note (Signed)
 Addended by: JOHNNYE LITTIE HERO on: 04/29/2024 11:08 AM   Modules accepted: Orders

## 2024-04-29 NOTE — Telephone Encounter (Signed)
 Ordered after patient was checked out and left: Obtain VQ scan due to DOE since his hip surgery. To rule out pulmonary embolism  Spoke with Isaiah at [facility] to let them know of additional testing ordered. Verbalized understanding.

## 2024-04-29 NOTE — Addendum Note (Signed)
 Addended by: JOHNNYE LITTIE HERO on: 04/29/2024 11:06 AM   Modules accepted: Orders

## 2024-04-29 NOTE — Addendum Note (Signed)
 Addended by: Bryanna Yim PRIYA on: 04/29/2024 11:13 AM   Modules accepted: Orders

## 2024-04-30 ENCOUNTER — Other Ambulatory Visit: Payer: Self-pay | Admitting: Vascular Surgery

## 2024-04-30 DIAGNOSIS — I6529 Occlusion and stenosis of unspecified carotid artery: Secondary | ICD-10-CM

## 2024-06-24 ENCOUNTER — Telehealth: Payer: Self-pay | Admitting: Internal Medicine

## 2024-06-24 NOTE — Telephone Encounter (Signed)
 Pt c/o medication issue:  1. Name of Medication:   Heart medication  2. How are you currently taking this medication (dosage and times per day)?   3. Are you having a reaction (difficulty breathing--STAT)?   4. What is your medication issue?    Caller Jesusa) wants a call back to clarify which medications patient should hold prior to his NM PET CT CAR PERF MULT ABS BF test

## 2024-06-24 NOTE — Telephone Encounter (Signed)
 Left message for patient to call back

## 2024-06-24 NOTE — Telephone Encounter (Signed)
 Medication instructions: Do not take erectile dysfunction medications for 72 hours prior to test (sildenafil, tadalafil) Do not take nitrates (isosorbide  mononitrate, Ranexa) the day before or day of test Do not take tamsulosin  the day before or morning of test Hold theophylline containing medications for 12 hours. Hold Dipyridamole 48 hours prior to the test.   Nothing to eat or drink, except water , 3 hours prior to arrival time.  NO caffeine/decaffeinated products, or chocolate 12 hours prior to arrival. (Please note decaffeinated beverages (teas/coffees) still contain caffeine).  If you have caffeine within 12 hours prior, the test will need to be rescheduled.

## 2024-06-24 NOTE — Progress Notes (Unsigned)
 [facility] [place]  ASSESSMENT / PLAN: George Valdez is a 68 y.o. male with atherosclerosis of native arteries of bilateral lower extremities causing minimal symptoms. No evidence of chronic limb threatening ischemia.   Recommend the following which can slow the progression of atherosclerosis and reduce the risk of major adverse cardiac / limb events:  Complete cessation from all tobacco products. Blood glucose control with goal A1c < 7%. Blood pressure control with goal blood pressure < 140/90 mmHg. Lipid reduction therapy with goal LDL-C <100 mg/dL (<29 if symptomatic from PAD).  Aspirin  81mg  PO QD.  Clopidogrel  75mg  PO QD. Atorvastatin 40-80mg  PO QD (or other high intensity statin therapy).  Patient counseled about limb threatening symptoms. Recommend follow up should these develop. Otherwise will see on as-needed basis.   CHIEF COMPLAINT: cold feet  HISTORY OF PRESENT ILLNESS: George Valdez is a 68 y.o. male who has been residing in a nursing home since a fairly disabling stroke.  He is minimally ambulatory.  He is able to transfer between chair and bed and wheelchair.  He does not walk much.  He does not walk fast or far enough to claudicate.  He denies any symptoms of ischemic rest pain.  He has no ulcers about his feet.   Past Medical History:  Diagnosis Date   Alcohol abuse    Anxiety    Asthma    BPH (benign prostatic hyperplasia)    Chronic pain    Constipation    COPD (chronic obstructive pulmonary disease) (HCC)    Coronary artery disease    w/ stents   Depression    Diabetes mellitus without complication (HCC)    type 2 no meds, diet controlled   Frequent falls    Hyperlipemia    Hypertension    Myocardial infarction (HCC)    Neuropathy    Pernicious anemia    Stroke (HCC)    hx 3 strokes, left sided weakness   Uses walker    a couple times a day to move around   Wheelchair dependent     Past Surgical History:   Procedure Laterality Date   BIOPSY  09/02/2021   Procedure: BIOPSY;  Surgeon: Cindie Carlin POUR, DO;  [facility];  Service: Endoscopy;;   CARDIAC CATHETERIZATION     CAROTID ENDARTERECTOMY     COLONOSCOPY WITH PROPOFOL  N/A 09/02/2021   Procedure: COLONOSCOPY WITH PROPOFOL ;  Surgeon: Cindie Carlin POUR, DO;  [facility];  Service: Endoscopy;  Laterality: N/A;  11:45am   ESOPHAGOGASTRODUODENOSCOPY (EGD) WITH PROPOFOL  N/A 09/02/2021   Procedure: ESOPHAGOGASTRODUODENOSCOPY (EGD) WITH PROPOFOL ;  Surgeon: Cindie Carlin POUR, DO;  [facility];  Service: Endoscopy;  Laterality: N/A;   POLYPECTOMY  09/02/2021   Procedure: POLYPECTOMY;  Surgeon: Cindie Carlin POUR, DO;  [facility];  Service: Endoscopy;;   TOOTH EXTRACTION N/A 08/31/2022   Procedure: DENTAL RESTORATION/EXTRACTIONS;  Surgeon: Sheryle Hamilton, DMD;  [facility]: [facility];  Service: Oral Surgery;  Laterality: N/A;   TOTAL HIP ARTHROPLASTY Right 09/18/2023   Procedure: RIGHT TOTAL HIP ARTHROPLASTY-POSTERIOR;  Surgeon: Jerri Kay HERO, MD;  [facility]: [facility];  Service: Orthopedics;  Laterality: Right;  3-C    Family History  Problem Relation Age of Onset   Congestive Heart Failure Mother 34   Heart attack Father 34   Congestive Heart Failure Sister    Colon cancer Neg Hx    Colon polyps Neg Hx     Social History  Socioeconomic History   Marital status: Married    Spouse name: Not on file   Number of children: Not on file   Years of education: Not on file   Highest education level: Not on file  Occupational History   Not on file  Tobacco Use   Smoking status: Every Day    Current packs/day: 0.25    Average packs/day: 0.3 packs/day for 40.0 years (10.0 ttl pk-yrs)    Types: Cigarettes   Smokeless tobacco: Never   Tobacco comments:    1/2 PPD  Vaping Use   Vaping status: Never Used  Substance and Sexual Activity   Alcohol use: Not Currently    Alcohol/week: 12.0 standard drinks of alcohol     Types: 12 Standard drinks or equivalent per week   Drug use: No   Sexual activity: Not on file  Other Topics Concern   Not on file  Social History Narrative   Not on file   Social Drivers of Health   Tobacco Use: High Risk (04/29/2024)   Patient History    Smoking Tobacco Use: Every Day    Smokeless Tobacco Use: Never    Passive Exposure: Not on file  Financial Resource Strain: Not on file  Food Insecurity: No Food Insecurity (09/18/2023)   Hunger Vital Sign    Worried About Running Out of Food in the Last Year: Never true    Ran Out of Food in the Last Year: Never true  Transportation Needs: No Transportation Needs (09/18/2023)   PRAPARE - Administrator, Civil Service (Medical): No    Lack of Transportation (Non-Medical): No  Physical Activity: Not on file  Stress: Not on file  Social Connections: Socially Isolated (09/18/2023)   Social Connection and Isolation Panel    Frequency of Communication with Friends and Family: Once a week    Frequency of Social Gatherings with Friends and Family: Never    Attends Religious Services: Never    Database Administrator or Organizations: No    Attends Banker Meetings: Never    Marital Status: Married  Catering Manager Violence: Not At Risk (09/18/2023)   Humiliation, Afraid, Rape, and Kick questionnaire    Fear of Current or Ex-Partner: No    Emotionally Abused: No    Physically Abused: No    Sexually Abused: No  Depression (PHQ2-9): Not on file  Alcohol Screen: Not on file  Housing: Unknown (09/18/2023)   Housing Stability Vital Sign    Unable to Pay for Housing in the Last Year: No    Number of Times Moved in the Last Year: Not on file    Homeless in the Last Year: No  Utilities: Not At Risk (09/18/2023)   [facility] Utilities    Threatened with loss of utilities: No  Health Literacy: Not on file    Allergies  Allergen Reactions   Codeine Itching    Current Outpatient Medications  Medication Sig Dispense  Refill   amLODipine  (NORVASC ) 5 MG tablet Take 1 tablet (5 mg total) by mouth daily. 90 tablet 2   aspirin  (ASPIRIN  81) 81 MG chewable tablet Chew 1 tablet (81 mg total) by mouth daily. Take one aspirin  81 mg daily with plavix  for 6 weeks post-op 42 tablet 0   clopidogrel  (PLAVIX ) 75 MG tablet Take 1 tablet (75 mg total) by mouth daily. (Patient taking differently: Take 75 mg by mouth every evening.)     cyanocobalamin  (,VITAMIN B-12,) 1000 MCG/ML injection Inject 1,000  mcg into the muscle every 30 (thirty) days.     diphenhydrAMINE  (BENADRYL ) 25 MG tablet Take 50 mg by mouth every 4 (four) hours as needed for itching.     docusate sodium  (COLACE) 100 MG capsule Take 1 capsule (100 mg total) by mouth daily as needed. 30 capsule 2   doxycycline  (VIBRA -TABS) 100 MG tablet Take 1 tablet (100 mg total) by mouth 2 (two) times daily. To be taken after surgery 20 tablet 0   DULoxetine  (CYMBALTA ) 20 MG capsule Take 40 mg by mouth daily.     ezetimibe  (ZETIA ) 10 MG tablet Take 1 tablet (10 mg total) by mouth daily. (Patient taking differently: Take 10 mg by mouth at bedtime.) 90 tablet 3   famotidine (PEPCID) 20 MG tablet Take 20 mg by mouth daily.     furosemide  (LASIX ) 40 MG tablet Take 1 tablet (40 mg total) by mouth daily. 90 tablet 3   HYDROcodone -acetaminophen  (NORCO) 7.5-325 MG tablet Take by mouth.     Magnesium  400 MG TABS Take 400 mg by mouth at bedtime.     Melatonin 3 MG CAPS Take 6 mg by mouth at bedtime.     methocarbamol  (ROBAXIN -750) 750 MG tablet Take 1 tablet (750 mg total) by mouth 3 (three) times daily as needed for muscle spasms. 30 tablet 1   Multiple Vitamin (MULTIVITAMIN WITH MINERALS) TABS tablet Take 1 tablet by mouth daily.     ondansetron  (ZOFRAN ) 4 MG tablet Take 1 tablet (4 mg total) by mouth every 8 (eight) hours as needed for nausea or vomiting. 40 tablet 0   oxyCODONE -acetaminophen  (PERCOCET) 5-325 MG tablet Take 1-2 tablets by mouth every 6 (six) hours as needed. To be  taken after surgery 40 tablet 0   pantoprazole  (PROTONIX ) 40 MG tablet Take 1 tablet (40 mg total) by mouth daily. 30 tablet 3   potassium chloride  20 MEQ TBCR Take 20 mEq by mouth daily. 30 tablet 0   potassium chloride  SA (KLOR-CON  M) 20 MEQ tablet Take 20 mEq by mouth daily.     pregabalin  (LYRICA ) 50 MG capsule Take 50 mg by mouth 3 (three) times daily.     rosuvastatin  (CRESTOR ) 40 MG tablet Take 40 mg by mouth every evening. (2100)     SPIRIVA  HANDIHALER 18 MCG CAPS Irrigate with 1 capsule as directed daily.     thiamine  100 MG tablet Take 1 tablet (100 mg total) by mouth daily. 30 tablet 0   tiotropium (SPIRIVA ) 18 MCG inhalation capsule Place 18 mcg into inhaler and inhale daily.     tiZANidine (ZANAFLEX) 2 MG tablet Take 2 mg by mouth at bedtime.     traZODone (DESYREL) 100 MG tablet Take 100 mg by mouth at bedtime.     vitamin E 400 UNIT capsule Take 400 Units by mouth daily.     No current facility-administered medications for this visit.    PHYSICAL EXAM There were no vitals filed for this visit.   68 year old man appearing older than stated age.  Regular rate and rhythm Unlabored breathing No palpable pedal pulses  PERTINENT LABORATORY AND RADIOLOGIC DATA  Most recent CBC    Latest Ref Rng & Units 09/18/2023    6:52 AM 08/31/2022    7:36 AM 08/31/2021   11:11 AM  CBC  WBC 4.0 - 10.5 K/uL 7.0  11.5  7.4   Hemoglobin 13.0 - 17.0 g/dL 84.8  85.7  89.1   Hematocrit 39.0 - 52.0 % 47.6  43.5  35.6   Platelets 150 - 400 K/uL 222  232  405      Most recent CMP    Latest Ref Rng & Units 09/18/2023    6:52 AM 08/31/2022    7:36 AM 08/31/2021   11:11 AM  CMP  Glucose 70 - 99 mg/dL 94  892  91   BUN 8 - 23 mg/dL 12  16  17    Creatinine 0.61 - 1.24 mg/dL 8.74  8.35  8.85   Sodium 135 - 145 mmol/L 138  138  139   Potassium 3.5 - 5.1 mmol/L 4.5  3.8  4.5   Chloride 98 - 111 mmol/L 108  107  109   CO2 22 - 32 mmol/L 21  24  24    Calcium  8.9 - 10.3 mg/dL 9.1  9.2  8.8   Total  Protein 6.5 - 8.1 g/dL  6.5  6.6   Total Bilirubin 0.3 - 1.2 mg/dL  0.7  0.3   Alkaline Phos 38 - 126 U/L  82  60   AST 15 - 41 U/L  24  36   ALT 0 - 44 U/L  20  40     Renal function CrCl cannot be calculated (Patient's most recent lab result is older than the maximum 21 days allowed.).  Hgb A1c MFr Bld (%)  Date Value  11/30/2015 5.9 (H)     +-------+-----------+-----------+------------+------------+  ABI/TBIToday's ABIToday's TBIPrevious ABIPrevious TBI  +-------+-----------+-----------+------------+------------+  Right 0.69       0.51                                 +-------+-----------+-----------+------------+------------+  Left  0.66       0.47                                 +-------+-----------+-----------+------------+------------+   Debby SAILOR. Magda, MD FACS [facility] Phone Number: [phone] 06/24/2024 4:20 PM   Total time spent on preparing this encounter including chart review, data review, collecting history, examining the patient, coordinating care for this new patient, 45 minutes.  Portions of this report may have been transcribed using voice recognition software.  Every effort has been made to ensure accuracy; however, inadvertent computerized transcription errors may still be present.

## 2024-06-25 ENCOUNTER — Ambulatory Visit (HOSPITAL_COMMUNITY)
Admission: RE | Admit: 2024-06-25 | Discharge: 2024-06-25 | Disposition: A | Discharge status: Home / Self Care | Source: Ambulatory Visit | Attending: Vascular Surgery | Admitting: Vascular Surgery

## 2024-06-25 ENCOUNTER — Telehealth (HOSPITAL_COMMUNITY): Payer: Self-pay | Admitting: *Deleted

## 2024-06-25 ENCOUNTER — Ambulatory Visit (INDEPENDENT_AMBULATORY_CARE_PROVIDER_SITE_OTHER): Admitting: Vascular Surgery

## 2024-06-25 ENCOUNTER — Encounter: Payer: Self-pay | Admitting: Vascular Surgery

## 2024-06-25 VITALS — BP 114/84 | HR 90 | Temp 98.4°F

## 2024-06-25 DIAGNOSIS — I771 Stricture of artery: Secondary | ICD-10-CM | POA: Insufficient documentation

## 2024-06-25 DIAGNOSIS — I6529 Occlusion and stenosis of unspecified carotid artery: Secondary | ICD-10-CM | POA: Insufficient documentation

## 2024-06-25 NOTE — Telephone Encounter (Signed)
 Reaching out to patient to offer assistance regarding upcoming cardiac imaging study; nurse verbalizes understanding of appt date/time, parking situation and where to check in, pre-test NPO status and medications ordered, and verified current allergies; name and call back number provided for further questions should they arise Sid Seats RN Navigator Cardiac Imaging [facility] [phone] office [phone] cell  Spoke to nurse Jesusa) at [facility] for patient to avoid caffeine for 12 hours prior to test.

## 2024-06-25 NOTE — Telephone Encounter (Signed)
 [facility] requested we fax instructions to [phone], attn:Jennifer

## 2024-06-26 ENCOUNTER — Encounter (HOSPITAL_COMMUNITY): Admission: RE | Admit: 2024-06-26 | Source: Ambulatory Visit

## 2024-06-26 DIAGNOSIS — I429 Cardiomyopathy, unspecified: Secondary | ICD-10-CM | POA: Diagnosis present

## 2024-06-26 MED ORDER — REGADENOSON 0.4 MG/5ML IV SOLN
0.4000 mg | Freq: Once | INTRAVENOUS | Status: AC
  Administered 2024-06-26: 0.4 mg via INTRAVENOUS

## 2024-06-26 MED ORDER — REGADENOSON 0.4 MG/5ML IV SOLN
INTRAVENOUS | Status: AC
  Filled 2024-06-26: qty 5

## 2024-06-26 MED ORDER — RUBIDIUM RB82 GENERATOR (RUBYFILL)
18.4000 | PACK | Freq: Once | INTRAVENOUS | Status: AC
  Administered 2024-06-26: 18.19 via INTRAVENOUS

## 2024-06-26 MED ORDER — RUBIDIUM RB82 GENERATOR (RUBYFILL)
18.4000 | PACK | Freq: Once | INTRAVENOUS | Status: AC
  Administered 2024-06-26: 18.22 via INTRAVENOUS

## 2024-07-01 LAB — NM PET CT CARDIAC PERFUSION MULTI W/ABSOLUTE BLOODFLOW
LV dias vol: 118 mL (ref 62–150)
Nuc Rest EF: 27 %
Nuc Stress EF: 38 %
Peak HR: 99 {beats}/min
Rest HR: 95 {beats}/min
Rest Nuclear Isotope Dose: 18.2 mCi
ST Depression (mm): 0 mm
Stress Nuclear Isotope Dose: 18.2 mCi
TID: 1.09

## 2024-07-02 ENCOUNTER — Telehealth: Payer: Self-pay | Admitting: Internal Medicine

## 2024-07-02 ENCOUNTER — Ambulatory Visit: Payer: Self-pay | Admitting: Internal Medicine

## 2024-07-02 NOTE — Telephone Encounter (Signed)
 Checking percert on the following patient for testing scheduled at [facility].    [facility] perf Particulate   07/05/2024

## 2024-07-05 ENCOUNTER — Encounter (HOSPITAL_COMMUNITY)

## 2024-07-10 ENCOUNTER — Encounter (HOSPITAL_COMMUNITY)

## 2024-07-24 ENCOUNTER — Encounter (HOSPITAL_COMMUNITY)
Admission: RE | Admit: 2024-07-24 | Discharge: 2024-07-24 | Disposition: A | Discharge status: Home / Self Care | Source: Ambulatory Visit | Attending: Internal Medicine | Admitting: Internal Medicine

## 2024-07-24 ENCOUNTER — Ambulatory Visit (HOSPITAL_COMMUNITY)
Admission: RE | Admit: 2024-07-24 | Discharge: 2024-07-24 | Disposition: A | Discharge status: Home / Self Care | Source: Ambulatory Visit | Attending: Internal Medicine | Admitting: Internal Medicine

## 2024-07-24 DIAGNOSIS — I2699 Other pulmonary embolism without acute cor pulmonale: Secondary | ICD-10-CM | POA: Diagnosis present

## 2024-07-24 MED ORDER — TECHNETIUM TO 99M ALBUMIN AGGREGATED
4.4000 | Freq: Once | INTRAVENOUS | Status: AC | PRN
  Administered 2024-07-24: 4.4 via INTRAVENOUS

## 2024-07-31 ENCOUNTER — Ambulatory Visit: Payer: Self-pay | Admitting: Internal Medicine

## 2024-08-12 ENCOUNTER — Ambulatory Visit: Admitting: Internal Medicine

## 2024-09-10 ENCOUNTER — Ambulatory Visit: Admitting: Physician Assistant
